# Patient Record
Sex: Male | Born: 1945 | Race: White | Hispanic: No | Marital: Married | Smoking: Never smoker
Health system: Southern US, Community
[De-identification: ages and names within clinical notes are randomized; demographics above are authoritative.]

## PROBLEM LIST (undated history)

## (undated) DIAGNOSIS — T8459XA Infection and inflammatory reaction due to other internal joint prosthesis, initial encounter: Secondary | ICD-10-CM

## (undated) DIAGNOSIS — Z86010 Personal history of colonic polyps: Secondary | ICD-10-CM

## (undated) DIAGNOSIS — K5790 Diverticulosis of intestine, part unspecified, without perforation or abscess without bleeding: Secondary | ICD-10-CM

## (undated) DIAGNOSIS — R7301 Impaired fasting glucose: Secondary | ICD-10-CM

## (undated) DIAGNOSIS — S43014A Anterior dislocation of right humerus, initial encounter: Secondary | ICD-10-CM

## (undated) DIAGNOSIS — M21372 Foot drop, left foot: Secondary | ICD-10-CM

## (undated) DIAGNOSIS — E7800 Pure hypercholesterolemia, unspecified: Secondary | ICD-10-CM

## (undated) DIAGNOSIS — Z9289 Personal history of other medical treatment: Secondary | ICD-10-CM

## (undated) DIAGNOSIS — I809 Phlebitis and thrombophlebitis of unspecified site: Secondary | ICD-10-CM

## (undated) DIAGNOSIS — Z96649 Presence of unspecified artificial hip joint: Secondary | ICD-10-CM

## (undated) DIAGNOSIS — M1990 Unspecified osteoarthritis, unspecified site: Secondary | ICD-10-CM

## (undated) DIAGNOSIS — G629 Polyneuropathy, unspecified: Secondary | ICD-10-CM

## (undated) DIAGNOSIS — I82409 Acute embolism and thrombosis of unspecified deep veins of unspecified lower extremity: Secondary | ICD-10-CM

## (undated) DIAGNOSIS — R7303 Prediabetes: Secondary | ICD-10-CM

## (undated) DIAGNOSIS — Z52000 Unspecified donor, whole blood: Secondary | ICD-10-CM

## (undated) DIAGNOSIS — Z860101 Personal history of adenomatous and serrated colon polyps: Secondary | ICD-10-CM

## (undated) DIAGNOSIS — M961 Postlaminectomy syndrome, not elsewhere classified: Secondary | ICD-10-CM

## (undated) DIAGNOSIS — M5136 Other intervertebral disc degeneration, lumbar region: Secondary | ICD-10-CM

## (undated) DIAGNOSIS — M25552 Pain in left hip: Secondary | ICD-10-CM

## (undated) DIAGNOSIS — N3941 Urge incontinence: Secondary | ICD-10-CM

## (undated) DIAGNOSIS — Z8546 Personal history of malignant neoplasm of prostate: Secondary | ICD-10-CM

## (undated) DIAGNOSIS — G894 Chronic pain syndrome: Secondary | ICD-10-CM

## (undated) DIAGNOSIS — G8929 Other chronic pain: Secondary | ICD-10-CM

## (undated) DIAGNOSIS — C801 Malignant (primary) neoplasm, unspecified: Secondary | ICD-10-CM

## (undated) DIAGNOSIS — N182 Chronic kidney disease, stage 2 (mild): Secondary | ICD-10-CM

## (undated) DIAGNOSIS — I872 Venous insufficiency (chronic) (peripheral): Secondary | ICD-10-CM

## (undated) HISTORY — DX: Personal history of colonic polyps: Z86.010

## (undated) HISTORY — DX: Other chronic pain: G89.29

## (undated) HISTORY — DX: Diverticulosis of intestine, part unspecified, without perforation or abscess without bleeding: K57.90

## (undated) HISTORY — DX: Urge incontinence: N39.41

## (undated) HISTORY — DX: Postlaminectomy syndrome, not elsewhere classified: M96.1

## (undated) HISTORY — DX: Impaired fasting glucose: R73.01

## (undated) HISTORY — DX: Foot drop, left foot: M21.372

## (undated) HISTORY — PX: SHOULDER ARTHROSCOPY: SHX128

## (undated) HISTORY — DX: Presence of unspecified artificial hip joint: Z96.649

## (undated) HISTORY — PX: COLONOSCOPY: SHX174

## (undated) HISTORY — PX: SPINAL CORD STIMULATOR REMOVAL: SHX2423

## (undated) HISTORY — DX: Acute embolism and thrombosis of unspecified deep veins of unspecified lower extremity: I82.409

## (undated) HISTORY — PX: POLYPECTOMY: SHX149

## (undated) HISTORY — DX: Pain in left hip: M25.552

## (undated) HISTORY — DX: Unspecified donor, whole blood: Z52.000

## (undated) HISTORY — DX: Personal history of adenomatous and serrated colon polyps: Z86.0101

## (undated) HISTORY — DX: Chronic pain syndrome: G89.4

## (undated) HISTORY — DX: Chronic kidney disease, stage 2 (mild): N18.2

## (undated) HISTORY — DX: Other intervertebral disc degeneration, lumbar region: M51.36

## (undated) HISTORY — DX: Infection and inflammatory reaction due to other internal joint prosthesis, initial encounter: T84.59XA

## (undated) HISTORY — DX: Anterior dislocation of right humerus, initial encounter: S43.014A

## (undated) HISTORY — DX: Personal history of malignant neoplasm of prostate: Z85.46

## (undated) HISTORY — PX: VASECTOMY: SHX75

## (undated) HISTORY — DX: Venous insufficiency (chronic) (peripheral): I87.2

## (undated) HISTORY — DX: Unspecified osteoarthritis, unspecified site: M19.90

## (undated) HISTORY — PX: CARPAL TUNNEL RELEASE: SHX101

## (undated) HISTORY — PX: JOINT REPLACEMENT: SHX530

## (undated) HISTORY — PX: BILATERAL CARPAL TUNNEL RELEASE: SHX6508

## (undated) HISTORY — DX: Pure hypercholesterolemia, unspecified: E78.00

## (undated) HISTORY — DX: Prediabetes: R73.03

## (undated) HISTORY — PX: FRACTURE SURGERY: SHX138

---
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
## 1967-10-08 HISTORY — PX: FRACTURE SURGERY: SHX138

## 2001-10-07 HISTORY — PX: OTHER SURGICAL HISTORY: SHX169

## 2004-04-10 ENCOUNTER — Ambulatory Visit (HOSPITAL_COMMUNITY): Admission: RE | Admit: 2004-04-10 | Discharge: 2004-04-11 | Payer: Self-pay | Admitting: Orthopedic Surgery

## 2004-08-27 ENCOUNTER — Ambulatory Visit: Payer: Self-pay | Admitting: Internal Medicine

## 2004-09-03 ENCOUNTER — Ambulatory Visit: Payer: Self-pay | Admitting: Internal Medicine

## 2004-10-07 HISTORY — PX: ROTATOR CUFF REPAIR: SHX139

## 2005-10-07 HISTORY — PX: OTHER SURGICAL HISTORY: SHX169

## 2005-12-16 ENCOUNTER — Ambulatory Visit: Payer: Self-pay | Admitting: Internal Medicine

## 2005-12-25 ENCOUNTER — Ambulatory Visit: Payer: Self-pay | Admitting: Internal Medicine

## 2006-01-06 ENCOUNTER — Encounter (INDEPENDENT_AMBULATORY_CARE_PROVIDER_SITE_OTHER): Payer: Self-pay | Admitting: *Deleted

## 2006-01-06 ENCOUNTER — Ambulatory Visit: Payer: Self-pay | Admitting: Internal Medicine

## 2006-10-07 HISTORY — PX: TOTAL HIP ARTHROPLASTY: SHX124

## 2006-10-23 ENCOUNTER — Encounter: Admission: RE | Admit: 2006-10-23 | Discharge: 2006-10-23 | Payer: Self-pay | Admitting: Orthopedic Surgery

## 2007-01-20 ENCOUNTER — Inpatient Hospital Stay (HOSPITAL_COMMUNITY): Admission: RE | Admit: 2007-01-20 | Discharge: 2007-01-24 | Payer: Self-pay | Admitting: Orthopedic Surgery

## 2010-05-14 ENCOUNTER — Telehealth (INDEPENDENT_AMBULATORY_CARE_PROVIDER_SITE_OTHER): Payer: Self-pay | Admitting: *Deleted

## 2010-05-18 DIAGNOSIS — E669 Obesity, unspecified: Secondary | ICD-10-CM

## 2010-05-18 DIAGNOSIS — IMO0002 Reserved for concepts with insufficient information to code with codable children: Secondary | ICD-10-CM | POA: Insufficient documentation

## 2010-05-18 DIAGNOSIS — M1710 Unilateral primary osteoarthritis, unspecified knee: Secondary | ICD-10-CM

## 2010-05-18 DIAGNOSIS — E66811 Obesity, class 1: Secondary | ICD-10-CM | POA: Insufficient documentation

## 2010-05-18 DIAGNOSIS — I872 Venous insufficiency (chronic) (peripheral): Secondary | ICD-10-CM | POA: Insufficient documentation

## 2010-05-21 ENCOUNTER — Ambulatory Visit: Payer: Self-pay | Admitting: Internal Medicine

## 2010-05-21 DIAGNOSIS — R972 Elevated prostate specific antigen [PSA]: Secondary | ICD-10-CM

## 2010-05-21 DIAGNOSIS — I809 Phlebitis and thrombophlebitis of unspecified site: Secondary | ICD-10-CM

## 2010-05-23 LAB — CONVERTED CEMR LAB
ALT: 18 units/L (ref 0–53)
AST: 19 units/L (ref 0–37)
Albumin: 3.9 g/dL (ref 3.5–5.2)
Alkaline Phosphatase: 77 units/L (ref 39–117)
BUN: 20 mg/dL (ref 6–23)
Basophils Absolute: 0 10*3/uL (ref 0.0–0.1)
Basophils Relative: 0.4 % (ref 0.0–3.0)
Bilirubin Urine: NEGATIVE
Bilirubin, Direct: 0.1 mg/dL (ref 0.0–0.3)
CO2: 27 meq/L (ref 19–32)
Calcium: 9.7 mg/dL (ref 8.4–10.5)
Chloride: 108 meq/L (ref 96–112)
Cholesterol: 168 mg/dL (ref 0–200)
Creatinine, Ser: 1.1 mg/dL (ref 0.4–1.5)
Eosinophils Absolute: 0.1 10*3/uL (ref 0.0–0.7)
Eosinophils Relative: 2.5 % (ref 0.0–5.0)
GFR calc non Af Amer: 71.33 mL/min (ref >60)
Glucose, Bld: 93 mg/dL (ref 70–99)
HCT: 47.5 % (ref 39.0–52.0)
HDL: 31.7 mg/dL — ABNORMAL LOW (ref >39.00)
Hemoglobin, Urine: NEGATIVE
Hemoglobin: 16.1 g/dL (ref 13.0–17.0)
Ketones, ur: NEGATIVE mg/dL
LDL Cholesterol: 108 mg/dL — ABNORMAL HIGH (ref 0–99)
Leukocytes, UA: NEGATIVE
Lymphocytes Relative: 29.1 % (ref 12.0–46.0)
Lymphs Abs: 1.6 10*3/uL (ref 0.7–4.0)
MCHC: 33.9 g/dL (ref 30.0–36.0)
MCV: 87.3 fL (ref 78.0–100.0)
Monocytes Absolute: 0.4 10*3/uL (ref 0.1–1.0)
Monocytes Relative: 8 % (ref 3.0–12.0)
Neutro Abs: 3.3 10*3/uL (ref 1.4–7.7)
Neutrophils Relative %: 60 % (ref 43.0–77.0)
Nitrite: NEGATIVE
PSA: 15.6 ng/mL — ABNORMAL HIGH (ref 0.10–4.00)
Phosphorus: 2.5 mg/dL (ref 2.3–4.6)
Platelets: 215 10*3/uL (ref 150.0–400.0)
Potassium: 4.9 meq/L (ref 3.5–5.1)
RBC: 5.44 M/uL (ref 4.22–5.81)
RDW: 15.8 % — ABNORMAL HIGH (ref 11.5–14.6)
Sodium: 140 meq/L (ref 135–145)
Specific Gravity, Urine: >1.03 (ref 1.000–1.030)
TSH: 1.63 microintl units/mL (ref 0.35–5.50)
Total Bilirubin: 0.6 mg/dL (ref 0.3–1.2)
Total CHOL/HDL Ratio: 5
Total Protein, Urine: NEGATIVE mg/dL
Total Protein: 6.8 g/dL (ref 6.0–8.3)
Triglycerides: 141 mg/dL (ref 0.0–149.0)
Urine Glucose: NEGATIVE mg/dL
Urobilinogen, UA: 0.2 (ref 0.0–1.0)
VLDL: 28.2 mg/dL (ref 0.0–40.0)
WBC: 5.5 10*3/uL (ref 4.5–10.5)
pH: 5 (ref 5.0–8.0)

## 2010-06-05 ENCOUNTER — Encounter: Payer: Self-pay | Admitting: Internal Medicine

## 2010-06-27 ENCOUNTER — Encounter: Payer: Self-pay | Admitting: Internal Medicine

## 2010-07-05 ENCOUNTER — Encounter: Payer: Self-pay | Admitting: Internal Medicine

## 2010-07-19 ENCOUNTER — Ambulatory Visit (HOSPITAL_COMMUNITY): Admission: RE | Admit: 2010-07-19 | Discharge: 2010-07-19 | Payer: Self-pay | Admitting: *Deleted

## 2010-07-31 ENCOUNTER — Encounter: Payer: Self-pay | Admitting: Internal Medicine

## 2010-08-07 ENCOUNTER — Ambulatory Visit
Admission: RE | Admit: 2010-08-07 | Discharge: 2010-10-04 | Payer: Self-pay | Source: Home / Self Care | Attending: Radiation Oncology | Admitting: Radiation Oncology

## 2010-08-08 ENCOUNTER — Encounter: Payer: Self-pay | Admitting: Internal Medicine

## 2010-08-17 ENCOUNTER — Encounter: Payer: Self-pay | Admitting: Internal Medicine

## 2010-10-05 ENCOUNTER — Encounter: Payer: Self-pay | Admitting: Internal Medicine

## 2010-10-07 DIAGNOSIS — C801 Malignant (primary) neoplasm, unspecified: Secondary | ICD-10-CM

## 2010-10-07 DIAGNOSIS — C61 Malignant neoplasm of prostate: Secondary | ICD-10-CM

## 2010-10-07 HISTORY — PX: OTHER SURGICAL HISTORY: SHX169

## 2010-10-07 HISTORY — PX: RADIOACTIVE SEED IMPLANT: SHX5150

## 2010-10-07 HISTORY — DX: Malignant neoplasm of prostate: C61

## 2010-10-07 HISTORY — DX: Malignant (primary) neoplasm, unspecified: C80.1

## 2010-10-22 LAB — CBC
HCT: 48 % (ref 39.0–52.0)
Hemoglobin: 16.1 g/dL (ref 13.0–17.0)
MCH: 31.9 pg (ref 26.0–34.0)
MCHC: 33.5 g/dL (ref 30.0–36.0)
MCV: 95 fL (ref 78.0–100.0)
Platelets: 193 10*3/uL (ref 150–400)
RBC: 5.05 MIL/uL (ref 4.22–5.81)
RDW: 13.9 % (ref 11.5–15.5)
WBC: 4.4 10*3/uL (ref 4.0–10.5)

## 2010-10-22 LAB — PROTIME-INR
INR: 0.97 (ref 0.00–1.49)
Prothrombin Time: 13.1 seconds (ref 11.6–15.2)

## 2010-10-22 LAB — COMPREHENSIVE METABOLIC PANEL
ALT: 28 U/L (ref 0–53)
AST: 24 U/L (ref 0–37)
Albumin: 3.7 g/dL (ref 3.5–5.2)
Alkaline Phosphatase: 87 U/L (ref 39–117)
BUN: 15 mg/dL (ref 6–23)
CO2: 30 mEq/L (ref 19–32)
Calcium: 9.9 mg/dL (ref 8.4–10.5)
Chloride: 105 mEq/L (ref 96–112)
Creatinine, Ser: 1.21 mg/dL (ref 0.4–1.5)
GFR calc Af Amer: >60 mL/min (ref >60)
GFR calc non Af Amer: >60 mL/min (ref >60)
Glucose, Bld: 95 mg/dL (ref 70–99)
Potassium: 4.7 mEq/L (ref 3.5–5.1)
Sodium: 142 mEq/L (ref 135–145)
Total Bilirubin: 0.9 mg/dL (ref 0.3–1.2)
Total Protein: 7.1 g/dL (ref 6.0–8.3)

## 2010-10-22 LAB — APTT: aPTT: 31 seconds (ref 24–37)

## 2010-10-23 ENCOUNTER — Encounter: Payer: Self-pay | Admitting: Internal Medicine

## 2010-10-23 ENCOUNTER — Ambulatory Visit
Admission: RE | Admit: 2010-10-23 | Discharge: 2010-10-23 | Payer: Self-pay | Source: Home / Self Care | Attending: Urology | Admitting: Urology

## 2010-10-28 ENCOUNTER — Encounter: Payer: Self-pay | Admitting: Orthopedic Surgery

## 2010-11-08 NOTE — Op Note (Signed)
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  OPERATION:  Prostate brachytherapy, cystoscopy with fluoroscopy.  ANESTHESIA:  General.  SURGEON:  Courtney Paris, M.D.  ASSISTANT:  Maryln Gottron, M.D.  BRIEF HISTORY:  A 65 year old patient with intermediate risk carcinoma of the prostate.  He had a PSA of 16.4 in September 2011.  Biopsy that month showed only two areas of the lateral left lobe with Gleason 3 + 3 of the prostate, but due to his elevated prostate-specific antigen a bone scan and CT scan were done which were negative for metastatic disease.  He had a 44 grams prostate.  He finished 25 treatments of external radiation therapy on October 04, 2010, and enters now for prostate brachytherapy for definitive treatment.  His IPSS score was low at 3 and he tolerated the external radiation quite well.  He understands the risks including impotence, radiation cystitis and/or proctitis and rectal injury but enters now for definitive therapy.  PROCEDURE IN DETAIL:  The patient was placed on the operating table in the dorsal lithotomy position after satisfactory induction of general anesthesia.  He was prepped and draped in the usual sterile fashion and given IV antibiotics.  The first time-out was performed and a rectal tube and the rectal ultrasound probe were inserted as well as a Foley catheter.  Dr. Dayton Scrape then did the planning for his brachytherapy and then I was called back to  the operating room where a second time-out was then performed.  A total of 26 needles of I-125 were then used for the treatment with 57 seeds.  The total apparent activity was 22.9710 millicuries.  The procedure seemed to go well and the fluoroscopy at the end of the case showed that he had good placement of the seeds into the prostate as evidenced from the 3 gold seeds which were placed prior to his radiation therapy.  The Foley catheter was then removed.  He was reprepped and a flexible cystoscope was used to inspect the anterior urethra, which showed no strictures, trilobar hyperplasia and the bladder was entered.  The bladder was free of any mucosal lesions and no seeds were seen in the bladder or on the retrograde view of the prostate.  Scope was removed and 16 Foley catheter inserted and the patient was taken to the recovery room in good condition.  He will later be discharged as an outpatient.  He will be instructed how to remove the Foley catheter in two days and will come back to see me in three weeks for followup.     Courtney Paris, M.D.     HMK/MEDQ  D:  10/23/2010  T:  10/23/2010  Job:  [id number]  Electronically Signed by Vic Blackbird M.D. on 11/08/2010 01:25:02 PM

## 2010-11-08 NOTE — Progress Notes (Signed)
Summary: Ok  to schedule cpx  Phone Note Call from Patient Call back at [facility] Phone [phone]   Caller: Patient Call For: wert Reason for Call: Talk to Nurse Summary of Call: former PCP of [facility].  Pt hasn't been seen since 2007, wants to schedule cpx - his wife is still a pcp pt of [facility].  Will MW still see this pt as primary. Initial call taken by: Eugene Gavia,  May 14, 2010 9:53 AM  Follow-up for Phone Call        Dr. Sherene Sires, pls advise.  Thanks! Gweneth Dimitri RN  May 14, 2010 10:17 AM ok for cpx  Follow-up by: Nyoka Cowden MD,  May 14, 2010 10:35 AM  Additional Follow-up for Phone Call Additional follow up Details #1::        Called, spoke with pt.  CPX scheduled for 05/21/10 at 8:45 am with [facility]--pt aware.  Advised pt to come fasting and to bring all meds with him to OV.  He verbalized understanding.  Gweneth Dimitri RN  May 14, 2010 10:39 AM

## 2010-11-08 NOTE — Letter (Signed)
Summary: [facility]  [facility]   Imported By: Sherian Rein 10/17/2010 14:17:02  _____________________________________________________________________  External Attachment:    Type:   Image     Comment:   External Document

## 2010-11-08 NOTE — Letter (Signed)
Summary: [facility]  [facility]   Imported By: Lennie Odor 08/20/2010 14:43:08  _____________________________________________________________________  External Attachment:    Type:   Image     Comment:   External Document

## 2010-11-08 NOTE — Letter (Signed)
Summary: [facility]  [facility]   Imported By: Lennie Odor 11/01/2010 15:00:21  _____________________________________________________________________  External Attachment:    Type:   Image     Comment:   External Document

## 2010-11-08 NOTE — Letter (Signed)
Summary: [facility]   Imported By: Sherian Rein 07/12/2010 09:23:52  _____________________________________________________________________  External Attachment:    Type:   Image     Comment:   External Document

## 2010-11-08 NOTE — Letter (Signed)
Summary: [facility]  [facility]   Imported By: Lester [place] 07/04/2010 08:35:35  _____________________________________________________________________  External Attachment:    Type:   Image     Comment:   External Document

## 2010-11-08 NOTE — Assessment & Plan Note (Signed)
Summary: Primary SVC/ cpx  - PSA 15 > urology referral   CC:  CPX.  fasting.  [name]  History of Present Illness: 69 yowm never smoker with severe DJD s/p L Hip replacement Dr Dorene Grebe.  May 21, 2010  1st pulmonary office eval in emr era. No specific c/o's.  Pt denies any significant sore throat, dysphagia, itching, sneezing,  nasal congestion or excess secretions,  fever, chills, sweats, unintended wt loss, pleuritic or exertional cp, hempoptysis, change in activity tolerance  orthopnea pnd or leg swelling. no tia, claudication symtoms    Current Medications (verified): 1)  Aspirin 325 Mg Tabs (Aspirin) .... Take 1 Tablet By Mouth Once A Day  Allergies (verified): 1)  ! Adhesive Tape  Past History:  Past Medical History: PHLEBITIS (ICD-451.9) OBESITY (ICD-278.00) OSTEOARTHRITIS, KNEES, BILATERAL (ICD-715.96) VENOUS INSUFFICIENCY, CHRONIC (ICD-459.81) Mod BPH     - PSA 15 May 21, 2010 > urology referral Health Maintenance....................................................Marland KitchenWert     - Td  2005     - Pneumovax May 22, 2010     - Coloscopy 01/06/2006     - CPX May 22, 2010        Past Surgical History: Rt rotator cuff repair July 2005 bilateral carpal tunnel surgery left hip replacement  Family History: Lung CA- Mother who was a smoker Rheumatic fever, father Vericose veins brother  Social History: Never smoker no alcohol Retired but still works PT for [employer] 4 children married  Vital Signs:  Patient profile:   65 year old male Height:      75 inches Weight:      265 pounds BMI:     33.24 O2 Sat:      99 % on Room air Temp:     98.0 degrees F oral Pulse rate:   58 / minute BP sitting:   120 / 60  (left arm) Cuff size:   large  Vitals Entered By: Gweneth Dimitri RN (May 21, 2010 8:42 AM)  O2 Flow:  Room air CC: CPX.  fasting.   Comments Medications reviewed with patient Daytime contact number verified with patient. Gweneth Dimitri RN  May 21, 2010 8:43 AM      Cholesterol               168 mg/dL                   1-610     ATP III Classification            Desirable:  < 200 mg/dL                    Borderline High:  200 - 239 mg/dL               High:  > = 240 mg/dL   Triglycerides             141.0 mg/dL                 9.6-045.4     Normal:  <150 mg/dL     Borderline High:  098 - 199 mg/dL   HDL                  [L]  11.91 mg/dL                 >47.82   VLDL Cholesterol          28.2 mg/dL  0.0-40.0   LDL Cholesterol      [H]  161 mg/dL                   0-96  CHO/HDL Ratio:  CHD Risk                             5                    Men          Women     1/2 Average Risk     3.4          3.3     Average Risk          5.0          4.4     2X Average Risk          9.6          7.1     3X Average Risk          15.0          11.0                           Tests: (2) Renal Function Panel (RENAL)   Sodium                    140 mEq/L                   135-145   Potassium                 4.9 mEq/L                   3.5-5.1   Chloride                  108 mEq/L                   96-112   Carbon Dioxide            27 mEq/L                    19-32   Calcium                   9.7 mg/dL                   0.4-54.0   Albumin                   3.9 g/dL                    9.8-1.1   BUN                       20 mg/dL                    9-14   Creatinine                1.1 mg/dL                   7.8-2.9   Glucose                   93 mg/dL  70-99   Phosphorus                2.5 mg/dL                   2.6-9.4   GFR                       71.33 mL/min                >60  Tests: (3) BMP (METABOL)   Sodium                    140 mEq/L                   135-145   Potassium                 4.9 mEq/L                   3.5-5.1   Chloride                  108 mEq/L                   96-112   Carbon Dioxide            27 mEq/L                    19-32   Glucose                   93 mg/dL                     85-46   BUN                       20 mg/dL                    2-70   Creatinine                1.1 mg/dL                   3.5-0.0   Calcium                   9.7 mg/dL                   9.3-81.8   GFR                       71.33 mL/min                >60  Tests: (4) CBC Platelet w/Diff (CBCD)   White Cell Count          5.5 K/uL                    4.5-10.5   Red Cell Count            5.44 Mil/uL                 4.22-5.81   Hemoglobin                16.1 g/dL                   29.9-37.1   Hematocrit                47.5 %  39.0-52.0   MCV                       87.3 fl                     78.0-100.0   MCHC                      33.9 g/dL                   13.0-86.5   RDW                  [H]  15.8 %                      11.5-14.6   Platelet Count            215.0 K/uL                  150.0-400.0   Neutrophil %              60.0 %                      43.0-77.0   Lymphocyte %              29.1 %                      12.0-46.0   Monocyte %                8.0 %                       3.0-12.0   Eosinophils%              2.5 %                       0.0-5.0   Basophils %               0.4 %                       0.0-3.0   Neutrophill Absolute      3.3 K/uL                    1.4-7.7   Lymphocyte Absolute       1.6 K/uL                    0.7-4.0   Monocyte Absolute         0.4 K/uL                    0.1-1.0  Eosinophils, Absolute                             0.1 K/uL                    0.0-0.7   Basophils Absolute        0.0 K/uL                    0.0-0.1  Tests: (5) Hepatic/Liver Function Panel (HEPATIC)   Total Bilirubin           0.6 mg/dL  0.3-1.2   Direct Bilirubin          0.1 mg/dL                   6.9-6.2   Alkaline Phosphatase      77 U/L                      39-117   AST                       19 U/L                      0-37   ALT                       18 U/L                      0-53   Total Protein             6.8 g/dL                     9.5-2.8   Albumin                   3.9 g/dL                    4.1-3.2  Tests: (6) TSH (TSH)   FastTSH                   1.63 uIU/mL                 0.35-5.50  Tests: (7) UDip Only (UDIP)   Color                     Lt. Yellow       RANGE:  Yellow;Lt. Yellow   Clarity                   Clear                       Clear   Specific Gravity          >1.030                      1.000 - 1.030   Urine Ph                  5.0                         5.0-8.0   Protein                   Negative                    Negative   Urine Glucose             Negative                    Negative   Ketones                   Negative                    Negative   Urine Bilirubin           Negative  Negative   Blood                     Negative                    Negative   Urobilinogen              0.2 mg/dL                   0.0 - 1.0   Leukocyte Esterace        Negative                    Negative   Nitrite                   Negative                    Negative  Tests: (8) Prostate Specific Antigen (PSA)   PSA-Hyb              [H]  15.60 ng/mL                 0.10-4.00  CXR  Procedure date:  05/21/2010  Findings:      No active cardiopulmonary disease.  Impression & Recommendations:  Problem # 1:  VENOUS INSUFFICIENCY, CHRONIC (ICD-459.81) High risk dvt, venous ulcers.  rx ASA and support hose, refer to Vascular as needed   Problem # 2:  ELEVATED PROSTATE SPECIFIC ANTIGEN (ICD-790.93)  Exam c/w mod bph only, refer to urology  Orders: Urology Referral (Urology)  Problem # 3:  OBESITY (ICD-278.00)  Weight control is a matter of calorie balance which needs to be tilted in the pt's favor by eating less and exercising more.  Specifically, I recommended  exercise at a level where pt  is short of breath but not out of breath 30 minutes daily.  If not losing weight on this program, I would strongly recommend pt see a nutritionist with a food diary recorded for two weeks  prior to the visit.     Medications Added to Medication List This Visit: 1)  Aspirin 325 Mg Tabs (Aspirin) .... Take 1 tablet by mouth once a day  Other Orders: Pneumococcal Vaccine (16109) Admin 1st Vaccine (60454) TLB-PSA (Prostate Specific Antigen) (84153-PSA) T-2 View CXR (71020TC) TLB-Lipid Panel (80061-LIPID) TLB-Renal Function Panel (80069-RENAL) TLB-BMP (Basic Metabolic Panel-BMET) (80048-METABOL) TLB-CBC Platelet - w/Differential (85025-CBCD) TLB-Hepatic/Liver Function Pnl (80076-HEPATIC) TLB-TSH (Thyroid Stimulating Hormone) (84443-TSH) TLB-Udip ONLY (81003-UDIP) Est. Patient 65& > (09811) New Patient 65&> (91478)  Patient Instructions: 1)  Call [phone] for your results w/in next 3 days - if there's something important  I feel you need to know,  I'll be in touch with you directly.    CardioPerfect ECG  ID: [id number] Patient: [name], [name] DOB: [birth year] Age: 65 Years Old Sex: Male Race: White Physician: Claudean Leavelle Technician: Gweneth Dimitri RN Height: 75 Weight: 265 Status: Unconfirmed Recorded: 05/21/2010 08:58 AM P/PR: 105 ms / 195 ms - Heart rate (maximum exercise) QRS: 98 QT/QTc/QTd: 427 ms / 418 ms / 22 ms - Heart rate (maximum exercise)  P/QRS/T axis: -11 deg / 38 deg / 61 deg - Heart rate (maximum exercise)  Heartrate: 55 bpm  Interpretation:   sinus rhythm (slow)   Normal ECG     Immunizations Administered:  Pneumonia Vaccine:    Vaccine Type: Pneumovax (Medicare)    Site: left deltoid    Mfr: Merck  Dose: 0.5 ml    Route: IM    Given by: Gweneth Dimitri RN    Exp. Date: 10/24/2011    Lot #: [id number]    VIS given: 05/04/96 version given May 21, 2010.

## 2010-11-08 NOTE — Consult Note (Signed)
[facility]   Imported By: Sherian Rein 08/23/2010 14:19:00  _____________________________________________________________________  External Attachment:    Type:   Image     Comment:   External Document

## 2010-11-08 NOTE — Consult Note (Signed)
Summary: [facility]  [facility]   Imported By: Lennie Odor 06/13/2010 12:08:41  _____________________________________________________________________  External Attachment:    Type:   Image     Comment:   External Document

## 2010-11-08 NOTE — Letter (Signed)
Summary: [facility]   Imported By: Sherian Rein 08/09/2010 14:39:28  _____________________________________________________________________  External Attachment:    Type:   Image     Comment:   External Document

## 2010-11-13 ENCOUNTER — Encounter: Payer: Self-pay | Admitting: Internal Medicine

## 2010-11-13 ENCOUNTER — Ambulatory Visit: Payer: Medicare Other | Attending: Radiation Oncology | Admitting: Radiation Oncology

## 2010-11-13 ENCOUNTER — Ambulatory Visit: Payer: Self-pay | Admitting: Radiation Oncology

## 2010-11-13 DIAGNOSIS — C61 Malignant neoplasm of prostate: Secondary | ICD-10-CM | POA: Insufficient documentation

## 2010-11-15 ENCOUNTER — Encounter: Payer: Self-pay | Admitting: Internal Medicine

## 2010-11-16 ENCOUNTER — Ambulatory Visit: Payer: Medicare Other | Attending: Radiation Oncology | Admitting: Radiation Oncology

## 2010-11-16 DIAGNOSIS — C61 Malignant neoplasm of prostate: Secondary | ICD-10-CM | POA: Insufficient documentation

## 2010-11-28 NOTE — Letter (Signed)
Summary: [facility]  [facility]   Imported By: Sherian Rein 11/20/2010 15:08:30  _____________________________________________________________________  External Attachment:    Type:   Image     Comment:   External Document

## 2010-11-28 NOTE — Consult Note (Signed)
Summary: [facility]  [facility]   Imported By: Lester [place] 11/21/2010 07:17:13  _____________________________________________________________________  External Attachment:    Type:   Image     Comment:   External Document

## 2011-02-22 NOTE — Op Note (Signed)
[name], [name]              ACCOUNT NO.:  [id number]   MEDICAL RECORD NO.:  [id number]          PATIENT TYPE:  INP   LOCATION:  2899                         FACILITY:  [facility]   PHYSICIAN:  Burnard Bunting, M.D.    DATE OF BIRTH:  1946-02-11   DATE OF PROCEDURE:  01/20/2007  DATE OF DISCHARGE:                               OPERATIVE REPORT   PREOPERATIVE DIAGNOSES:  1. Left hip arthritis.  2. Post traumatic deformity.   POSTOPERATIVE DIAGNOSES:  1. Left hip arthritis.  2. Post traumatic deformity.   PROCEDURE:  Left total hip replacement.   SURGEON:  Burnard Bunting, M.D.   ASSISTANT:  ___Dick Tresa Res MD____   ANESTHESIA:  General endotracheal.   ESTIMATED BLOOD LOSS:  350   DRAINS:  Hemovac times one.   INDICATIONS FOR PROCEDURE:  [name] is a 65 year old patient with  end-stage left hip arthritis.  He presents now for operative management  after explanation of risks and benefits.  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  Component II, DePuy S-Rom  acetabular cup size 60, 53 head; S-Rom pedals free 42F small, femoral  stem 20 x 15 x 165, 36 standard neck with 12 lateral offset and a +9  taper-sleeve adaptor.   PROCEDURE IN DETAIL:  The patient was brought to the operating room  where general endotracheal anesthesia was introduced.  Preoperative  antibiotics administered in the left leg.  He was placed in the lateral  decubitus position with an axillary bulb and left peroneal nerve well  padded.  The patient's left hip, leg and foot were prepped in a sterile  fashion, the hip draped in a sterile manner. ioban was used to cover the  operative field.  ________ was utilized.  Skin and subcutaneous tissues  were sharply divided.  Fascia lata was encountered and divided distally  and proximally with blunt dissection of the gluteus maxis muscle fibers.  The  sciatic nerve was palpated at this time and protected throughout all  the remaining portion of the case.  At this time, capsulotomy was  performed after detachment of the piriformis tendon.  Significant  thickening of the capsule was encountered, both posteriorly and  anteriorly.  At this time, the head was dislocated and, using a trial  stem, a femoral neck cut was made.  The femoral neck cut was made.  The  canal was then reamed up to 15.5 mm which gave excellent cortical bite.  At this time, acetabulum was reamed.  The anterior retractor was placed.  A transverse acetabulum ligament was released.  A Holman retractor was  placed.  The acetabulum was then reamed and 45 degrees of abduction and  about 20 degrees of anteversion.  The 60-mm acetabular cup was tapped  into position with excellent pressure obtained.  At this time, the  proximal reamer was prepared using a proximal reaming.  Taper _______  sleeve was placed.  A trial stem was then placed in 30 degrees of  anteversion;  0, +6 and +9 sleeve adaptors were then utilized to check  stability.  With the +9 sleeve in place, the patient had full extension,  excellent rotation, was stable in a position of sleep and had stability  to 70 degrees of internal rotation with 90 degrees of hip flexion and 10  degrees of adduction.  Radiograph demonstrated approximately equal leg  lengths.  He was about 1 cm short on that side prior to the procedure.   At this time, the trial femoral components were removed, the true  components were placed.  The same stability parameters maintained.  _Sciatic nerve______ was then palpated and found to be intact.  The  incision was then thoroughly irrigated.  The incision was then closed  over a Hemovac drain using #1 Vicryl and reapproximating the fascia lata  followed by interrupted and inverted 0-Vicryl, 2-0 Vicryl and skin  staples.  A knee immobilizer was then placed.  _Leg lengths were near  equal at the  end_ of the case.   It should be noted that the assistance of Dr. Tresa Res was required for  manipulation of the leg as well as retraction of the important  neurovascular structures with the medical necessity for the safety of  the patient that Dr. Tresa Res was present for his expert assistance.      Burnard Bunting, M.D.  Electronically Signed     GSD/MEDQ  D:  01/20/2007  T:  01/21/2007  Job:  [id number]

## 2011-02-22 NOTE — Op Note (Signed)
[name], [name]                          ACCOUNT NO.:  [id number]   MEDICAL RECORD NO.:  [id number]                   PATIENT TYPE:  OIB   LOCATION:  5020                                 FACILITY:  [facility]   PHYSICIAN:  Burnard Bunting, M.D.                 DATE OF BIRTH:  01-Mar-1946   DATE OF PROCEDURE:  04/10/2004  DATE OF DISCHARGE:  04/11/2004                                 OPERATIVE REPORT   PREOPERATIVE DIAGNOSIS:  Right shoulder rotator cuff tear and superior  labrum anterior to posterior tear and impingement.   POSTOPERATIVE DIAGNOSIS:  Right shoulder rotator cuff tear and superior  labrum anterior to posterior tear and impingement.   PROCEDURE:  1. Right shoulder diagnostic arthroscopy with subacromial decompression.  2. Right shoulder debridement with intra-articular superior labrum anterior     to posterior tear.  3. Right shoulder open biceps tenodesis.  4. Right shoulder rotator cuff tendon repair with tear measuring 2 x 2 cm.   SURGEON:  Burnard Bunting, M.D.   ANESTHESIA:  General endotracheal plus interscalene block.   FINDINGS:  1. Examination under anesthesia, range of motion, external rotation 15     degrees of abduction 70 degrees, forward flexion 180 degrees, internal     rotation 90 degrees, abduction 90 degrees.  2. Diagnostic operative arthroscopy:     a. Type 2 superior labrum anterior to posterior tear.     b. Intact glenohumeral articular surface.     c. 2 x 2 cm rotator cuff tear.     d. Impingement bursitis.     e. Significant biceps tendon fraying at the anchor.   DESCRIPTION OF PROCEDURE:  The patient was brought to the operating room  where a general anesthesia was induced.  Preoperative antibiotics were  administered.  The right shoulder was prepped.  The patient was placed in  the beach chair position with the head in the neutral alignment and the left  arm well padded.  The right arm, hand, elbow and shoulder were prepped and  draped in  a sterile manner.   The anatomy of the shoulder was identified through the posterior, anterior  and lateral margins of the acromion as well as the [facility] joint and coracoid  process.  Then 10 cc of epinephrine solution was injected into the  subacromial space, 20 cc of saline was injected to the glenohumeral joint.  The posterior portal was then established.  The portal was 2 cm medial and  inferior to the posterior lateral margin of the acromion.  Diagnostic  arthroscopy was performed.  The anterior portal was created under direct  visualization with the spinal needle.  A type 2 SLAP tear was noted with an  unstable biceps anchor.  Biceps fraying was also observed.  The biceps  tendon was released, and the SLAP tear was debrided.  Extensive debridement  was required.  Accompanying synovitis was  also debrided.  The patient had  intact glenohumeral articular surfaces.  He had a intact 2 x 2 cm rotator  cuff tear and approximately a 1.5 x 2 cm rotator tear of the supraspinatus.  Intra-articular subscapularis was intact.  At this time, the scope was  placed into a subacromial space.  Coracoacromial ligament was released but  not resected.  Bursectomy was performed.  Subacromial decompression was also  performed using cutting block technique.  At this time, Collier Flowers was used to  cover the operative field.  Anterior and posterior portals were closed  before and after placing the [place] using 3-0 nylon sutures.  A lateral joint  incision was made on the shoulder measuring approximately 2 inches.  The  skin and subcutaneous tissue was sharply divided.  Deltoid was split and  measured a distance of 3 cm from its attachment on the acromion.  Stay  suture was placed at the apex of this deltoid split.  Rotator cuff tear was  identified.  Rotator cuff was mobilized using releases and the Cobb  elevator.  Rotator cuff tear has been reattached to a prepared bony trough  using both _________ and 5-0 corkscrew  suture anchors to obtain a double row  repair.  At this time, the arm was taken through a range of motion and a  repair was found to be water tight and intact.  The subacromial space and  joint were then thoroughly irrigated.  Deltoid split was created using a #1  Vicryl suture.  Skin was closed using interrupted inverted 2-0 Vicryl suture  and running 3-0 pullout Prolene.  The patient was placed in a bulky dressing  and shoulder immobilizer.  He tolerated the procedure well without immediate  complications.                                               Burnard Bunting, M.D.    GSD/MEDQ  D:  04/28/2004  T:  04/29/2004  Job:  [id number]

## 2011-02-22 NOTE — Discharge Summary (Signed)
NAMEARIEN, BENINCASA              ACCOUNT NO.:  [id number]   MEDICAL RECORD NO.:  [id number]          PATIENT TYPE:  INP   LOCATION:  5015                         FACILITY:  [facility]   PHYSICIAN:  Burnard Bunting, M.D.    DATE OF BIRTH:  1945-10-30   DATE OF ADMISSION:  01/20/2007  DATE OF DISCHARGE:  01/24/2007                               DISCHARGE SUMMARY   DISCHARGE DIAGNOSIS:  Left hip arthritis.   SECONDARY DIAGNOSIS:  None.   OPERATIONS AND PROCEDURES:  Left total hip replacement performed January 20, 2007.   HOSPITAL COURSE:  Crosby Oriordan is an 65 year old ambulatory male with  left hip arthritis.  He underwent left total hip replacement, January 20, 2007.  He tolerated the procedure well without immediate complications.  Postop day 1, leg lengths were equal.  Hemoglobin was 13.6.  He was  started on Coumadin for DVT prophylaxis.  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  He had an unremarkable  recovery.  He mobilized well with physical therapy, weightbearing as  tolerated.  INR was near therapeutic at the time of discharge at 1.8.  Incision was intact.  The patient otherwise had an unremarkable  recovery.  He was discharged home in good condition.   DISCHARGE MEDICATIONS:  Include:  1. Percocet 5/325 1 or 2 p.o. every 4 hours p.r.n. pain.  2. Robaxin 500 mg p.o. every 6 hours p.r.n. spasm.  3. Coumadin 5 mg p.o. daily until INR is 2 to 2.5.   Will follow up with me in 7 days for suture removal.      Burnard Bunting, M.D.  Electronically Signed     GSD/MEDQ  D:  03/18/2007  T:  03/19/2007  Job:  [id number]

## 2011-02-28 ENCOUNTER — Encounter: Payer: Self-pay | Admitting: Internal Medicine

## 2011-03-25 ENCOUNTER — Encounter: Payer: Self-pay | Admitting: Internal Medicine

## 2011-04-04 ENCOUNTER — Ambulatory Visit (AMBULATORY_SURGERY_CENTER): Payer: Medicare Other | Admitting: *Deleted

## 2011-04-04 VITALS — Ht 75.0 in | Wt 268.3 lb

## 2011-04-04 DIAGNOSIS — Z86010 Personal history of colonic polyps: Secondary | ICD-10-CM

## 2011-04-04 MED ORDER — PEG-KCL-NACL-NASULF-NA ASC-C 100 G PO SOLR: ORAL | Status: DC

## 2011-04-11 ENCOUNTER — Ambulatory Visit (AMBULATORY_SURGERY_CENTER): Payer: Medicare Other | Admitting: Internal Medicine

## 2011-04-11 ENCOUNTER — Encounter: Payer: Self-pay | Admitting: Internal Medicine

## 2011-04-11 VITALS — BP 143/76 | HR 6 | Temp 97.0°F | Resp 12 | Ht 75.0 in | Wt 268.0 lb

## 2011-04-11 DIAGNOSIS — Z1211 Encounter for screening for malignant neoplasm of colon: Secondary | ICD-10-CM

## 2011-04-11 DIAGNOSIS — D126 Benign neoplasm of colon, unspecified: Secondary | ICD-10-CM

## 2011-04-11 DIAGNOSIS — Z86010 Personal history of colonic polyps: Secondary | ICD-10-CM

## 2011-04-11 MED ORDER — SODIUM CHLORIDE 0.9 % IV SOLN: 500.0000 mL | INTRAVENOUS | Status: DC

## 2011-04-11 NOTE — Patient Instructions (Signed)
Discharge instructions given with verbal understanding. Handouts on polyps given. Resume previous medications.

## 2011-04-12 ENCOUNTER — Telehealth: Payer: Self-pay | Admitting: *Deleted

## 2011-04-12 NOTE — Telephone Encounter (Signed)
No voicemail i.d. No mess left

## 2011-10-03 ENCOUNTER — Encounter: Payer: Self-pay | Admitting: *Deleted

## 2011-10-03 NOTE — Progress Notes (Signed)
08/08/2010 I-PSS RESULTS=[id number] SCORE=3 11/13/2010 I-PSS RESULTS=[id number] SCORE=22

## 2011-11-19 ENCOUNTER — Encounter: Payer: Medicare Other | Admitting: Family Medicine

## 2011-11-26 ENCOUNTER — Encounter: Payer: Self-pay | Admitting: Family Medicine

## 2011-11-26 ENCOUNTER — Ambulatory Visit (INDEPENDENT_AMBULATORY_CARE_PROVIDER_SITE_OTHER): Payer: Medicare Other | Admitting: Family Medicine

## 2011-11-26 VITALS — BP 143/88 | HR 52 | Temp 98.0°F | Ht 75.0 in | Wt 274.0 lb

## 2011-11-26 DIAGNOSIS — Z0000 Encounter for general adult medical examination without abnormal findings: Secondary | ICD-10-CM

## 2011-11-26 LAB — COMPREHENSIVE METABOLIC PANEL
ALT: 27 U/L (ref 0–53)
AST: 23 U/L (ref 0–37)
Alkaline Phosphatase: 76 U/L (ref 39–117)
Calcium: 9.9 mg/dL (ref 8.4–10.5)
Chloride: 105 mEq/L (ref 96–112)
Creat: 1.05 mg/dL (ref 0.50–1.35)
Potassium: 4.4 mEq/L (ref 3.5–5.3)

## 2011-11-26 LAB — CBC WITH DIFFERENTIAL/PLATELET
Basophils Absolute: 0 10*3/uL (ref 0.0–0.1)
Basophils Relative: 0 % (ref 0–1)
Eosinophils Absolute: 0.2 10*3/uL (ref 0.0–0.7)
Eosinophils Relative: 3 % (ref 0–5)
HCT: 51.2 % (ref 39.0–52.0)
Lymphocytes Relative: 27 % (ref 12–46)
MCH: 32 pg (ref 26.0–34.0)
MCHC: 33.2 g/dL (ref 30.0–36.0)
Monocytes Absolute: 0.5 10*3/uL (ref 0.1–1.0)
Neutro Abs: 3.2 10*3/uL (ref 1.7–7.7)
Neutrophils Relative %: 59 % (ref 43–77)
RDW: 13.2 % (ref 11.5–15.5)
WBC: 5.3 10*3/uL (ref 4.0–10.5)

## 2011-11-26 LAB — TSH: TSH: 1.423 u[IU]/mL (ref 0.350–4.500)

## 2011-11-26 LAB — LIPID PANEL: LDL Cholesterol: 118 mg/dL — ABNORMAL HIGH (ref 0–99)

## 2011-11-26 MED ORDER — ZOSTER VACCINE LIVE 19400 UNT/0.65ML ~~LOC~~ SOLR: 0.6500 mL | Freq: Once | SUBCUTANEOUS | Status: AC

## 2011-11-26 MED ORDER — ZOSTER VACCINE LIVE 19400 UNT/0.65ML ~~LOC~~ SOLR: 0.6500 mL | Freq: Once | SUBCUTANEOUS | Status: DC

## 2011-11-26 NOTE — Patient Instructions (Signed)
Health Maintenance, Males A healthy lifestyle and preventative care can promote health and wellness.  Maintain regular health, dental, and eye exams.   Eat a healthy diet. Foods like vegetables, fruits, whole grains, low-fat dairy products, and lean protein foods contain the nutrients you need without too many calories. Decrease your intake of foods high in solid fats, added sugars, and salt. Get information about a proper diet from your caregiver, if necessary.   Regular physical exercise is one of the most important things you can do for your health. Most adults should get at least 150 minutes of moderate-intensity exercise (any activity that increases your heart rate and causes you to sweat) each week. In addition, most adults need muscle-strengthening exercises on 2 or more days a week.    Maintain a healthy weight. The body mass index (BMI) is a screening tool to identify possible weight problems. It provides an estimate of body fat based on height and weight. Your caregiver can help determine your BMI, and can help you achieve or maintain a healthy weight. For adults 20 years and older:   A BMI below 18.5 is considered underweight.   A BMI of 18.5 to 24.9 is normal.   A BMI of 25 to 29.9 is considered overweight.   A BMI of 30 and above is considered obese.   Maintain normal blood lipids and cholesterol by exercising and minimizing your intake of saturated fat. Eat a balanced diet with plenty of fruits and vegetables. Blood tests for lipids and cholesterol should begin at age 20 and be repeated every 5 years. If your lipid or cholesterol levels are high, you are over 50, or you are a high risk for heart disease, you may need your cholesterol levels checked more frequently.Ongoing high lipid and cholesterol levels should be treated with medicines, if diet and exercise are not effective.   If you smoke, find out from your caregiver how to quit. If you do not use tobacco, do not start.    If you choose to drink alcohol, do not exceed 2 drinks per day. One drink is considered to be 12 ounces (355 mL) of beer, 5 ounces (148 mL) of wine, or 1.5 ounces (44 mL) of liquor.   Avoid use of street drugs. Do not share needles with anyone. Ask for help if you need support or instructions about stopping the use of drugs.   High blood pressure causes heart disease and increases the risk of stroke. Blood pressure should be checked at least every 1 to 2 years. Ongoing high blood pressure should be treated with medicines if weight loss and exercise are not effective.   If you are 45 to 67 years old, ask your caregiver if you should take aspirin to prevent heart disease.   Diabetes screening involves taking a blood sample to check your fasting blood sugar level. This should be done once every 3 years, after age 45, if you are within normal weight and without risk factors for diabetes. Testing should be considered at a younger age or be carried out more frequently if you are overweight and have at least 1 risk factor for diabetes.   Colorectal cancer can be detected and often prevented. Most routine colorectal cancer screening begins at the age of 50 and continues through age 75. However, your caregiver may recommend screening at an earlier age if you have risk factors for colon cancer. On a yearly basis, your caregiver may provide home test kits to check for hidden   blood in the stool. Use of a small camera at the end of a tube, to directly examine the colon (sigmoidoscopy or colonoscopy), can detect the earliest forms of colorectal cancer. Talk to your caregiver about this at age 50, when routine screening begins. Direct examination of the colon should be repeated every 5 to 10 years through age 75, unless early forms of pre-cancerous polyps or small growths are found.   Healthy men should no longer receive prostate-specific antigen (PSA) blood tests as part of routine cancer screening. Consult with  your caregiver about prostate cancer screening.   Practice safe sex. Use condoms and avoid high-risk sexual practices to reduce the spread of sexually transmitted infections (STIs).   Use sunscreen with a sun protection factor (SPF) of 30 or greater. Apply sunscreen liberally and repeatedly throughout the day. You should seek shade when your shadow is shorter than you. Protect yourself by wearing long sleeves, pants, a wide-brimmed hat, and sunglasses year round, whenever you are outdoors.   Notify your caregiver of new moles or changes in moles, especially if there is a change in shape or color. Also notify your caregiver if a mole is larger than the size of a pencil eraser.   A one-time screening for abdominal aortic aneurysm (AAA) and surgical repair of large AAAs by sound wave imaging (ultrasonography) is recommended for ages 65 to 75 years who are current or former smokers.   Stay current with your immunizations.  Document Released: 03/21/2008 Document Revised: 06/05/2011 Document Reviewed: 02/18/2011 Exit[employer] Patient Information 2012 [employer]. 

## 2011-11-26 NOTE — Assessment & Plan Note (Signed)
Reviewed age and gender appropriate health maintenance issues (prudent diet, regular exercise, health risks of tobacco and excessive alcohol, use of seatbelts, fire alarms in home, use of sunscreen).  Also reviewed age and gender appropriate health screening as well as vaccine recommendations. Zostavax rx given today. Fasting lipid panel, CBC, CMET, and TSH drawn today. He gets routine (q7mo) urologist f/u for prostate ca surveillance. His colonoscopy is UTD--needs repeat in 2015. Encouraged increased exercise and caloric limitation --get BMI down.

## 2011-11-26 NOTE — Progress Notes (Signed)
Office Note 11/26/2011  CC:  Chief Complaint  Patient presents with  . Establish Care    transfer from Dr. Sherene Sires    HPI:  [name] is a 66 y.o. White male who is transferring care from Patient's most recent primary MD: Dr. Sherene Sires at [facility] Old records in EPIC/HL were reviewed prior to or during today's visit.  Hasn't had CPE in a couple of years at least. Feels well, no acute complaints.  Past Medical History  Diagnosis Date  . Cancer     prostate cancer 2011--ext beam radiation + radiation seed implants  . Hx of adenomatous colonic polyps     polypectomy 2007 and 04/2011 (+adenomatous, no high grade dysplasia)  . Foot drop, left     s/p hip fracture 1969  . Chronic venous insufficiency     +compression hose  . Osteoarthritis     knees and hips primarily    Past Surgical History  Procedure Date  . Seed implant prostate   . Total hip arthroplasty     left  . Rotator cuff repair     right  . Bilateral carpal tunnel release   . Arthroscopy left shoulder     FH: noncontributory  History   Social History  . Marital Status: Married    Spouse Name: N/A    Number of Children: N/A  . Years of Education: N/A   Occupational History  . Not on file.   Social History Main Topics  . Smoking status: Never Smoker   . Smokeless tobacco: Never Used  . Alcohol Use: No  . Drug Use: No  . Sexually Active: Not on file   Other Topics Concern  . Not on file   Social History Narrative   Married, 2 biologic children, 2 adopted, 5 GC.Lives in [place].  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  No T/A/Ds.Enjoys woodworking.    Outpatient Encounter Prescriptions as of 11/26/2011  Medication Sig Dispense Refill  . aspirin 81 MG tablet Take 81 mg by mouth daily.        . Multiple Vitamin (MULTIVITAMIN) tablet Take 1 tablet by mouth daily.        . Omega-3 Fatty Acids (FISH OIL) 500 MG CAPS Take by  mouth. 1 tablet daily       . solifenacin (VESICARE) 5 MG tablet Take 5 mg by mouth daily.      [name] zoster vaccine live, PF, (ZOSTAVAX) 16109 UNT/0.65ML injection Inject 19,400 Units into the skin once.  1 vial  0  . DISCONTD: peg 3350 powder (MOVIPREP) 100 G SOLR moviprep as directed  1 kit  0  . DISCONTD: zoster vaccine live, PF, (ZOSTAVAX) 60454 UNT/0.65ML injection Inject 19,400 Units into the skin once.  1 vial  0   Facility-Administered Encounter Medications as of 11/26/2011  Medication Dose Route Frequency Provider Last Rate Last Dose  . DISCONTD: 0.9 %  sodium chloride infusion  500 mL Intravenous Continuous Yancey Flemings, MD        No Known Allergies  ROS Review of Systems  Constitutional: Negative for fever, chills, appetite change and fatigue.  HENT: Negative for ear pain, congestion, sore throat, neck stiffness and dental problem.   Eyes: Negative for discharge, redness and visual disturbance.  Respiratory: Negative for cough, chest tightness, shortness of breath and wheezing.   Cardiovascular: Positive for leg swelling (mild chronic bilateral--venous stasis). Negative for chest pain and palpitations.  Gastrointestinal: Negative for nausea, vomiting, abdominal pain, diarrhea and blood in stool.  Genitourinary: Negative for dysuria, urgency, frequency, hematuria, flank pain and difficulty urinating.  Musculoskeletal: Negative for myalgias, back pain, joint swelling and arthralgias.  Skin: Negative for pallor and rash.  Neurological: Negative for dizziness, speech difficulty, weakness and headaches.  Hematological: Negative for adenopathy. Does not bruise/bleed easily.  Psychiatric/Behavioral: Negative for confusion and sleep disturbance. The patient is not nervous/anxious.     PE; Blood pressure 143/88, pulse 52, temperature 98 F (36.7 C), temperature source Temporal, height 6\' 3"  (1.905 m), weight 274 lb (124.286 kg), SpO2 99.00%. Gen: Alert, well appearing, mildly  obese-appearing white male.  Patient is oriented to person, place, time, and situation.  Affect is pleasant.  Thought is lucid, speech lucid. ENT: Ears: EACs clear, normal epithelium.  TMs with good light reflex and landmarks bilaterally.  Eyes: no injection, icteris, swelling, or exudate.  EOMI, PERRLA. Nose: no drainage or turbinate edema/swelling.  No injection or focal lesion.  Mouth: lips without lesion/swelling.  Oral mucosa pink and moist.  Dentition intact, several fillings in place. Oropharynx without erythema, exudate, or swelling.  Neck - No masses or thyromegaly or limitation in range of motion.  Carotids 2+ bilat, no bruits. CV: RRR, no m/r/g.   LUNGS: CTA bilat, nonlabored resps, good aeration in all lung fields. ABD: soft, NT, ND, BS normal.  No hepatospenomegaly or mass.  No bruits. EXT: 2+ pitting edema from knee down to feet bilat, extensive varicosities and hemosiderin changes of skin in ankles.  Left anterior tibial hyperpigmented macular area (present since staph infection in that area many years ago per pt--no recent change). Neuro: normal except left foot drop (weakness in extension of ankle).  Pertinent labs:  None today  ASSESSMENT AND PLAN:   Transfer pt:  Health maintenance examination Reviewed age and gender appropriate health maintenance issues (prudent diet, regular exercise, health risks of tobacco and excessive alcohol, use of seatbelts, fire alarms in home, use of sunscreen).  Also reviewed age and gender appropriate health screening as well as vaccine recommendations. Zostavax rx given today. Fasting lipid panel, CBC, CMET, and TSH drawn today. He gets routine (q83mo) urologist f/u for prostate ca surveillance. His colonoscopy is UTD--needs repeat in 2015. Encouraged increased exercise and caloric limitation --get BMI down.     Return in about 1 year (around 11/25/2012) for CPE .

## 2011-11-28 ENCOUNTER — Encounter: Payer: Self-pay | Admitting: Family Medicine

## 2012-08-14 ENCOUNTER — Encounter: Payer: Self-pay | Admitting: Family Medicine

## 2012-11-25 ENCOUNTER — Ambulatory Visit (INDEPENDENT_AMBULATORY_CARE_PROVIDER_SITE_OTHER): Payer: Medicare Other | Admitting: Family Medicine

## 2012-11-25 ENCOUNTER — Encounter: Payer: Self-pay | Admitting: Family Medicine

## 2012-11-25 VITALS — BP 160/82 | HR 56 | Resp 16 | Ht 75.0 in | Wt 269.0 lb

## 2012-11-25 DIAGNOSIS — R030 Elevated blood-pressure reading, without diagnosis of hypertension: Secondary | ICD-10-CM

## 2012-11-25 DIAGNOSIS — Z23 Encounter for immunization: Secondary | ICD-10-CM

## 2012-11-25 DIAGNOSIS — Z0000 Encounter for general adult medical examination without abnormal findings: Secondary | ICD-10-CM

## 2012-11-25 LAB — CBC WITH DIFFERENTIAL/PLATELET
Basophils Absolute: 0 10*3/uL (ref 0.0–0.1)
Basophils Relative: 0.4 % (ref 0.0–3.0)
Eosinophils Absolute: 0.1 10*3/uL (ref 0.0–0.7)
Eosinophils Relative: 3.1 % (ref 0.0–5.0)
HCT: 48.3 % (ref 39.0–52.0)
Hemoglobin: 16.1 g/dL (ref 13.0–17.0)
Lymphocytes Relative: 29.4 % (ref 12.0–46.0)
Lymphs Abs: 1.3 10*3/uL (ref 0.7–4.0)
MCHC: 33.3 g/dL (ref 30.0–36.0)
MCV: 92.4 fl (ref 78.0–100.0)
Monocytes Absolute: 0.4 10*3/uL (ref 0.1–1.0)
Monocytes Relative: 8.2 % (ref 3.0–12.0)
Neutro Abs: 2.6 10*3/uL (ref 1.4–7.7)
Neutrophils Relative %: 58.9 % (ref 43.0–77.0)
Platelets: 192 10*3/uL (ref 150.0–400.0)
RBC: 5.23 Mil/uL (ref 4.22–5.81)
RDW: 12.9 % (ref 11.5–14.6)
WBC: 4.4 10*3/uL — ABNORMAL LOW (ref 4.5–10.5)

## 2012-11-25 LAB — COMPREHENSIVE METABOLIC PANEL
ALT: 30 U/L (ref 0–53)
AST: 27 U/L (ref 0–37)
Albumin: 3.7 g/dL (ref 3.5–5.2)
Alkaline Phosphatase: 76 U/L (ref 39–117)
BUN: 16 mg/dL (ref 6–23)
CO2: 29 mEq/L (ref 19–32)
Calcium: 9.7 mg/dL (ref 8.4–10.5)
Chloride: 106 mEq/L (ref 96–112)
Creatinine, Ser: 1.1 mg/dL (ref 0.4–1.5)
GFR: 67.93 mL/min (ref >60.00)
Glucose, Bld: 84 mg/dL (ref 70–99)
Potassium: 3.7 mEq/L (ref 3.5–5.1)
Sodium: 141 mEq/L (ref 135–145)
Total Bilirubin: 0.7 mg/dL (ref 0.3–1.2)
Total Protein: 6.9 g/dL (ref 6.0–8.3)

## 2012-11-25 LAB — LIPID PANEL
Cholesterol: 179 mg/dL (ref 0–200)
HDL: 32.3 mg/dL — ABNORMAL LOW (ref >39.00)
LDL Cholesterol: 115 mg/dL — ABNORMAL HIGH (ref 0–99)
Total CHOL/HDL Ratio: 6
Triglycerides: 161 mg/dL — ABNORMAL HIGH (ref 0.0–149.0)
VLDL: 32.2 mg/dL (ref 0.0–40.0)

## 2012-11-25 LAB — TSH: TSH: 0.77 u[IU]/mL (ref 0.35–5.50)

## 2012-11-25 NOTE — Progress Notes (Signed)
Office Note 11/25/2012  CC:  Chief Complaint  Patient presents with  . Annual Exam    no problems    HPI:  [name] is a 67 y.o. White male who is here for CPE.  I have not seen him since his las CPE a year ago. Getting q63mo urology f/u for hx of prost ca--last PSA 0.5. [facility]--[facility].  He's trying to make a few small lifestyle changes regarding diet and exercise but admits it is a slow process. He'll be going to [place] for a mission trip soon, got Tdap today. He feels well, has no complaints.  Past Medical History  Diagnosis Date  . 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 Hx of adenomatous colonic polyps     polypectomy 2007 and 04/2011 (+adenomatous, no high grade dysplasia)  . Foot drop, left     s/p hip fracture 1969  . Chronic venous insufficiency     +compression hose  . Osteoarthritis     knees and hips primarily    Past Surgical History  Procedure Laterality Date  . Seed implant prostate    . Total hip arthroplasty      left  . Rotator cuff repair      right  . Bilateral carpal tunnel release    . Arthroscopy left shoulder      History reviewed. No pertinent family history.  History   Social History  . Marital Status: Married    Spouse Name: N/A    Number of Children: N/A  . Years of Education: N/A   Occupational History  . Not on file.   Social History Main Topics  . Smoking status: Never Smoker   . Smokeless tobacco: Never Used  . Alcohol Use: No  . Drug Use: No  . Sexually Active: Not on file   Other Topics Concern  . Not on file   Social History Narrative   Married, 2 biologic children, 2 adopted, 5 GC.   Lives in [place].  Worked 35 yrs at [employer].   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   No formal exercise.  No T/A/Ds.   Enjoys woodworking.    Outpatient Prescriptions Prior to Visit  Medication  Sig Dispense Refill  . aspirin 81 MG tablet Take 81 mg by mouth daily.        . Multiple Vitamin (MULTIVITAMIN) tablet Take 1 tablet by mouth daily.        . Omega-3 Fatty Acids (FISH OIL) 500 MG CAPS Take by mouth. 1 tablet daily       . solifenacin (VESICARE) 5 MG tablet Take 5 mg by mouth daily.       No facility-administered medications prior to visit.    No Known Allergies  ROS Review of Systems  Constitutional: Negative for fever, chills, appetite change and fatigue.  HENT: Negative for ear pain, congestion, sore throat, neck stiffness and dental problem.   Eyes: Negative for discharge, redness and visual disturbance.  Respiratory: Negative for cough, chest tightness, shortness of breath and wheezing.   Cardiovascular: Negative for chest pain, palpitations and leg swelling.  Gastrointestinal: Negative for nausea, vomiting, abdominal pain, diarrhea and blood in stool.  Genitourinary: Negative for dysuria, urgency, frequency, hematuria, flank pain and difficulty urinating.  Musculoskeletal: Positive for gait problem (chronic left foot drop). Negative for myalgias, back pain, joint  swelling and arthralgias.  Skin: Negative for pallor and rash.  Neurological: Negative for dizziness, speech difficulty, weakness and headaches.  Hematological: Negative for adenopathy. Does not bruise/bleed easily.  Psychiatric/Behavioral: Negative for confusion and sleep disturbance. The patient is not nervous/anxious.     PE; Blood pressure 160/82, pulse 56, resp. rate 16, height 6\' 3"  (1.905 m), weight 269 lb (122.018 kg). Gen: Alert, well appearing.  Patient is oriented to person, place, time, and situation. AFFECT: pleasant, lucid thought and speech. ENT: Ears: EACs clear, normal epithelium.  TMs with good light reflex and landmarks bilaterally.  Eyes: no injection, icteris, swelling, or exudate.  EOMI, PERRLA. Nose: no drainage or turbinate edema/swelling.  No injection or focal lesion.  Mouth:  lips without lesion/swelling.  Oral mucosa pink and moist.  Dentition intact and without obvious caries or gingival swelling.  Oropharynx without erythema, exudate, or swelling.  Neck: supple/nontender.  No LAD, mass, or TM.  Carotid pulses 2+ bilaterally, without bruits. CV: RRR, no m/r/g.   LUNGS: CTA bilat, nonlabored resps, good aeration in all lung fields. ABD: soft, NT, ND, BS normal.  No hepatospenomegaly or mass.  No bruits. EXT: no clubbing, cyanosis, or edema.  Musculoskeletal: no joint swelling, erythema, warmth, or tenderness.  ROM of all joints intact. Skin - no sores or suspicious lesions or rashes or color changes   Pertinent labs:  None today   ASSESSMENT AND PLAN:   Health maintenance examination Reviewed age and gender appropriate health maintenance issues (prudent diet, regular exercise, health risks of tobacco and excessive alcohol, use of seatbelts, fire alarms in home, use of sunscreen).  Also reviewed age and gender appropriate health screening as well as vaccine recommendations. Tdap today. Fasting health panel today.  Next colonoscopy 2015.  He gets q25mo urology f/u. Check bp daily for the next 4-6 wks and call nurse with report of these. Signs/symptoms to call or return for were reviewed and pt expressed understanding.     FOLLOW UP:  Return in about 1 year (around 11/25/2013) for CPE with fasting labs.

## 2012-11-25 NOTE — Assessment & Plan Note (Addendum)
Reviewed age and gender appropriate health maintenance issues (prudent diet, regular exercise, health risks of tobacco and excessive alcohol, use of seatbelts, fire alarms in home, use of sunscreen).  Also reviewed age and gender appropriate health screening as well as vaccine recommendations. Tdap today. Fasting health panel today.  Next colonoscopy 2015.  He gets q18mo urology f/u. Check bp daily for the next 4-6 wks and call nurse with report of these. Signs/symptoms to call or return for were reviewed and pt expressed understanding.

## 2013-05-27 ENCOUNTER — Ambulatory Visit (HOSPITAL_BASED_OUTPATIENT_CLINIC_OR_DEPARTMENT_OTHER)
Admission: RE | Admit: 2013-05-27 | Discharge: 2013-05-27 | Disposition: A | Discharge status: Home / Self Care | Payer: Medicare Other | Source: Ambulatory Visit | Attending: Family Medicine | Admitting: Family Medicine

## 2013-05-27 ENCOUNTER — Encounter: Payer: Self-pay | Admitting: Family Medicine

## 2013-05-27 ENCOUNTER — Ambulatory Visit (INDEPENDENT_AMBULATORY_CARE_PROVIDER_SITE_OTHER): Payer: Medicare Other | Admitting: Family Medicine

## 2013-05-27 VITALS — BP 130/70 | HR 65 | Temp 97.4°F | Ht 75.0 in | Wt 255.5 lb

## 2013-05-27 DIAGNOSIS — M7989 Other specified soft tissue disorders: Secondary | ICD-10-CM

## 2013-05-27 DIAGNOSIS — G629 Polyneuropathy, unspecified: Secondary | ICD-10-CM

## 2013-05-27 DIAGNOSIS — G609 Hereditary and idiopathic neuropathy, unspecified: Secondary | ICD-10-CM | POA: Insufficient documentation

## 2013-05-27 DIAGNOSIS — L089 Local infection of the skin and subcutaneous tissue, unspecified: Secondary | ICD-10-CM

## 2013-05-27 MED ORDER — AMOXICILLIN-POT CLAVULANATE 875-125 MG PO TABS: 1.0000 | ORAL_TABLET | Freq: Two times a day (BID) | ORAL | Status: DC

## 2013-05-27 NOTE — Assessment & Plan Note (Addendum)
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  Need to treat as infection and will start augmentin 875 mg bid--3 week supply rx'd today in case prolonged course needed. Will check plain films of great toe on left and 2nd toe on right and if no sign of osteo will likely further eval these areas with MRIs---but will see how his blood work looks and see how he responds to abx first. Refer to wound care clinic at [facility]--2nd toe on right foot needs debridement. F/u here in 4d. Labs done here today: CBC, BMET, CRP, HbA1c, Uric acid level.

## 2013-05-27 NOTE — Progress Notes (Signed)
OFFICE NOTE  05/27/2013  CC:  Chief Complaint  Patient presents with  . Foot Swelling     HPI: Patient is a 67 y.o. Caucasian male who is here for here for right great toe swelling x 24h, then noted it was red this morning.  No f/c and no malaise.  It is not painful, but he cites a long hx of progressive loss of sensation in both feet--says sciatic nerve was cut when he broke his left hip long ago (fell off mother's roof--1969, then had THR 01/2007).  Also notes unclear duration of some changes to the tip of his right foot 2nd toe. Has hx of good arterial circ to feet but hx of recurrent phlebitis--says this has caused progressive sensory changes in BOTH feet (?) No polyuria or polyphagia or polydipsia---no hx of DM 2 or hyperglycemia on labs.  Hx of surgery in remote past on great toe on left foot: bone spur removal.  No problems since. No hx of toe or foot infection.  No hx of gout.  Pertinent PMH:  Past Medical History  Diagnosis Date  . 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 Hx of adenomatous colonic polyps     polypectomy 2007 and 04/2011 (+adenomatous, no high grade dysplasia)  . Foot drop, left     s/p hip fracture 1969  . Chronic venous insufficiency     +compression hose  . Osteoarthritis     knees and hips primarily   Past Surgical History  Procedure Laterality Date  . Seed implant prostate    . Total hip arthroplasty      left  . Rotator cuff repair      right  . Bilateral carpal tunnel release    . Arthroscopy left shoulder       MEDS:  Outpatient Prescriptions Prior to Visit  Medication Sig Dispense Refill  . aspirin 81 MG tablet Take 81 mg by mouth daily.        . Omega-3 Fatty Acids (FISH OIL) 500 MG CAPS Take by mouth. 1 tablet daily       . solifenacin (VESICARE) 5 MG tablet Take 5 mg by mouth daily.      . Multiple Vitamin (MULTIVITAMIN) tablet Take 1 tablet by mouth daily.          No facility-administered medications prior to visit.    PE: Blood pressure 130/70, pulse 65, temperature 97.4 F (36.3 C), temperature source Temporal, height 6\' 3"  (1.905 m), weight 255 lb 8 oz (115.894 kg), SpO2 97.00%. Gen: Alert, well appearing.  Patient is oriented to person, place, time, and situation. AFFECT: pleasant, lucid thought and speech. LEGS: trace pretibial and pedeal edema.  DP and PT pulses 2+ bilat.   Some scattered varicosities are present.   Left foot great toe with swelling and erythema of prox and dist phalanges but no swelling or erythema of MTP joint. Nail is atrophic.  Toe is nontender--he essentially says he cannot feel anything when I palpate his toe.  Undersurface of this toe has a superficial area of skin desquamation but no erythema or drainage tract is noted. Great toe circumference at widest point on left great toe is 12.3 cm, on right great toe it is 10.3 cm. Right foot 2nd toe with thick, dark callus-- crusty distal skin on distal phalanx tip, undersurface of this toe with some translucency and whitish appearance.  Nontender.  He has  minimal sensation to this toe as well.  No erythema or significant swelling of this toe.  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  Need to treat as infection and will start augmentin 875 mg bid--3 week supply rx'd today in case prolonged course needed. Will check plain films of great toe on left and 2nd toe on right and if no sign of osteo will likely further eval these areas with MRIs---but will see how his blood work looks and see how he responds to abx first. Refer to wound care clinic at [facility]--2nd toe on right foot needs debridement. F/u here in 4d. Labs done here today: CBC, BMET, CRP, HbA1c, Uric acid level.   An After Visit Summary was printed and given to the  patient.  FOLLOW UP: 4d

## 2013-05-28 LAB — BASIC METABOLIC PANEL
CO2: 27 mEq/L (ref 19–32)
Chloride: 105 mEq/L (ref 96–112)
Glucose, Bld: 101 mg/dL — ABNORMAL HIGH (ref 70–99)
Potassium: 4.2 mEq/L (ref 3.5–5.3)
Sodium: 139 mEq/L (ref 135–145)

## 2013-05-28 LAB — CBC WITH DIFFERENTIAL/PLATELET
Eosinophils Relative: 4 % (ref 0–5)
HCT: 43.6 % (ref 39.0–52.0)
Lymphocytes Relative: 34 % (ref 12–46)
Lymphs Abs: 1.2 10*3/uL (ref 0.7–4.0)
MCV: 89.5 fL (ref 78.0–100.0)
Monocytes Absolute: 0.5 10*3/uL (ref 0.1–1.0)
Monocytes Relative: 15 % — ABNORMAL HIGH (ref 3–12)
RBC: 4.87 MIL/uL (ref 4.22–5.81)
WBC: 3.4 10*3/uL — ABNORMAL LOW (ref 4.0–10.5)

## 2013-05-28 LAB — URIC ACID: Uric Acid, Serum: 7.8 mg/dL (ref 4.0–7.8)

## 2013-05-31 ENCOUNTER — Encounter: Payer: Self-pay | Admitting: Family Medicine

## 2013-05-31 ENCOUNTER — Ambulatory Visit (INDEPENDENT_AMBULATORY_CARE_PROVIDER_SITE_OTHER): Payer: Medicare Other | Admitting: Family Medicine

## 2013-05-31 VITALS — BP 168/76 | HR 54 | Temp 98.4°F | Resp 18 | Ht 75.0 in | Wt 255.0 lb

## 2013-05-31 DIAGNOSIS — M7989 Other specified soft tissue disorders: Secondary | ICD-10-CM

## 2013-05-31 DIAGNOSIS — L089 Local infection of the skin and subcutaneous tissue, unspecified: Secondary | ICD-10-CM | POA: Insufficient documentation

## 2013-05-31 NOTE — Progress Notes (Signed)
OFFICE NOTE  05/31/2013  CC:  Chief Complaint  Patient presents with  . Follow-up    toe swelling     HPI: Patient is a 67 y.o. Caucasian male who is here for recheck of left great toe and right 2nd toe infections. Has bee non abx for about 3 full days now.  Toe is much less red, much more mobile. No fever/chills/malaise.  Tolerating abx fine.  No new symptoms.  Pertinent PMH:  Past Medical History  Diagnosis Date  . 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 Hx of adenomatous colonic polyps     polypectomy 2007 and 04/2011 (+adenomatous, no high grade dysplasia)  . Foot drop, left     s/p hip fracture 1969  . Chronic venous insufficiency     +compression hose  . Osteoarthritis     knees and hips primarily    MEDS:  Outpatient Prescriptions Prior to Visit  Medication Sig Dispense Refill  . amoxicillin-clavulanate (AUGMENTIN) 875-125 MG per tablet Take 1 tablet by mouth 2 (two) times daily.  42 tablet  0  . aspirin 81 MG tablet Take 81 mg by mouth daily.        . Omega-3 Fatty Acids (FISH OIL) 500 MG CAPS Take by mouth. 1 tablet daily       . solifenacin (VESICARE) 5 MG tablet Take 5 mg by mouth daily.      . Multiple Vitamin (MULTIVITAMIN) tablet Take 1 tablet by mouth daily.         No facility-administered medications prior to visit.    PE: Blood pressure 168/76, pulse 54, temperature 98.4 F (36.9 C), temperature source Temporal, resp. rate 18, height 6\' 3"  (1.905 m), weight 255 lb (115.667 kg), SpO2 99.00%. Left great toe with a bit of a hyperpigmented hue in MTP region, with some SLIGHT increase pinkish hue in prox and dist phalanges of this toe compared to the same area on the right foot.  Plantar surface with superficial desquamation but no drainage tract or erythema.  No tenderness to the toe at all. Right foot 2nd toe with distal callus+hyperpigmented areas and a couple of areas of increased  lucency--unchanged compared to prior exam.  No drainage tract or tenderness.  LABS: none. Recent plain films of both of the involved toes showed no plain film evidence to support osteo.  IMPRESSION AND PLAN:  Infected left great toe and right 2nd toe: at this point his exam, recent x-rays, and response to therapy in just 3d support dx of soft tissue infection only.  I have advised him to continue the 21d course of augmentin I rx'd him and to keep appt with wound care clinic that is set up for 06/18/13  FOLLOW UP: 1 mo

## 2013-06-02 ENCOUNTER — Encounter: Payer: Self-pay | Admitting: Family Medicine

## 2013-06-02 DIAGNOSIS — G629 Polyneuropathy, unspecified: Secondary | ICD-10-CM | POA: Insufficient documentation

## 2013-06-18 ENCOUNTER — Encounter (HOSPITAL_BASED_OUTPATIENT_CLINIC_OR_DEPARTMENT_OTHER): Payer: Medicare Other | Attending: General Surgery

## 2013-07-01 ENCOUNTER — Encounter: Payer: Self-pay | Admitting: Family Medicine

## 2013-07-01 ENCOUNTER — Ambulatory Visit (INDEPENDENT_AMBULATORY_CARE_PROVIDER_SITE_OTHER): Payer: Medicare Other | Admitting: Family Medicine

## 2013-07-01 VITALS — BP 162/78 | HR 52 | Temp 98.2°F | Resp 16 | Ht 75.0 in | Wt 260.0 lb

## 2013-07-01 DIAGNOSIS — R030 Elevated blood-pressure reading, without diagnosis of hypertension: Secondary | ICD-10-CM

## 2013-07-01 DIAGNOSIS — L089 Local infection of the skin and subcutaneous tissue, unspecified: Secondary | ICD-10-CM

## 2013-07-01 NOTE — Progress Notes (Signed)
OFFICE NOTE  07/01/2013  CC:  Chief Complaint  Patient presents with  . Follow-up     HPI: Patient is a 67 y.o. Caucasian male who is here for 1 mo f/u infection of left foot great toe and right foot 2nd toe. Toes doing well. Was seen at wound care center, no open wound so they wondered what he was doing there. He has finished 3 wk course of augmentin--feels great.  His blood pressure when he donates blood is always <140/90 per his report, plus he did several checks at home and these were all normal.  His bp is up here usually---likely white coat HTN.  Pertinent PMH:  Past Medical History  Diagnosis Date  . 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 Hx of adenomatous colonic polyps     polypectomy 2007 and 04/2011 (+adenomatous, no high grade dysplasia)  . Foot drop, left     s/p hip fracture 1969  . Chronic venous insufficiency     +compression hose  . Osteoarthritis     knees and hips primarily  . Toe infection 05/27/2013    Left great toe and right 2nd toe   Past Surgical History  Procedure Laterality Date  . Seed implant prostate    . Total hip arthroplasty      left  . Rotator cuff repair      right  . Bilateral carpal tunnel release    . Arthroscopy left shoulder     Past family and social history reviewed and there are no changes since the patient's last office visit with me.  MEDS:  Vesicare 5mg  qd, MVI qd, ASA 81mg  qd  PE: Blood pressure 162/78, pulse 52, temperature 98.2 F (36.8 C), temperature source Temporal, resp. rate 16, height 6\' 3"  (1.905 m), weight 260 lb (117.935 kg), SpO2 100.00%. FEET: left great toe without any erythema, swelling, or tenderness.   Right foot 2nd toe with only a bit of callus distally.  Tissue is pink, nail is normal.     IMPRESSION AND PLAN:  Toe infection Resolved left foot great toe infection. His right foot 2nd toe never had an infection.  The  thickened/callused tissue at distal aspect of this toe is from excessive rubbing from great toe next to it (I thought it may have been mild eschar tissue overlying an abscess.. This is better with cream and some periodic clipping of the callus.  It looks much better today.  Wound care center was very helpful, although they ended up not having to cut anything and he says they didn't even charge him.  Elevated blood pressure reading without diagnosis of hypertension White coat HTN. Monitor and report to [place] if bp at home or blood bank is elevated.   An After Visit Summary was printed and given to the patient.  FOLLOW UP: prn

## 2013-07-01 NOTE — Assessment & Plan Note (Signed)
Resolved left foot great toe infection. His right foot 2nd toe never had an infection.  The thickened/callused tissue at distal aspect of this toe is from excessive rubbing from great toe next to it (I thought it may have been mild eschar tissue overlying an abscess.. This is better with cream and some periodic clipping of the callus.  It looks much better today.  Wound care center was very helpful, although they ended up not having to cut anything and he says they didn't even charge him.

## 2013-07-01 NOTE — Assessment & Plan Note (Signed)
White coat HTN. Monitor and report to [place] if bp at home or blood bank is elevated.

## 2013-09-06 DIAGNOSIS — S43014A Anterior dislocation of right humerus, initial encounter: Secondary | ICD-10-CM

## 2013-09-06 HISTORY — DX: Anterior dislocation of right humerus, initial encounter: S43.014A

## 2013-09-11 ENCOUNTER — Emergency Department (HOSPITAL_BASED_OUTPATIENT_CLINIC_OR_DEPARTMENT_OTHER)
Admission: EM | Admit: 2013-09-11 | Discharge: 2013-09-11 | Disposition: A | Discharge status: Home / Self Care | Payer: Medicare Other | Attending: Emergency Medicine | Admitting: Emergency Medicine

## 2013-09-11 ENCOUNTER — Emergency Department (HOSPITAL_BASED_OUTPATIENT_CLINIC_OR_DEPARTMENT_OTHER): Payer: Medicare Other

## 2013-09-11 ENCOUNTER — Encounter (HOSPITAL_BASED_OUTPATIENT_CLINIC_OR_DEPARTMENT_OTHER): Payer: Self-pay | Admitting: Emergency Medicine

## 2013-09-11 DIAGNOSIS — M1710 Unilateral primary osteoarthritis, unspecified knee: Secondary | ICD-10-CM | POA: Insufficient documentation

## 2013-09-11 DIAGNOSIS — Z872 Personal history of diseases of the skin and subcutaneous tissue: Secondary | ICD-10-CM | POA: Insufficient documentation

## 2013-09-11 DIAGNOSIS — Z8781 Personal history of (healed) traumatic fracture: Secondary | ICD-10-CM | POA: Insufficient documentation

## 2013-09-11 DIAGNOSIS — S43004A Unspecified dislocation of right shoulder joint, initial encounter: Secondary | ICD-10-CM

## 2013-09-11 DIAGNOSIS — Y929 Unspecified place or not applicable: Secondary | ICD-10-CM | POA: Insufficient documentation

## 2013-09-11 DIAGNOSIS — Z79899 Other long term (current) drug therapy: Secondary | ICD-10-CM | POA: Insufficient documentation

## 2013-09-11 DIAGNOSIS — M1610 Unilateral primary osteoarthritis, unspecified hip: Secondary | ICD-10-CM | POA: Insufficient documentation

## 2013-09-11 DIAGNOSIS — Z7982 Long term (current) use of aspirin: Secondary | ICD-10-CM | POA: Insufficient documentation

## 2013-09-11 DIAGNOSIS — Z8679 Personal history of other diseases of the circulatory system: Secondary | ICD-10-CM | POA: Insufficient documentation

## 2013-09-11 DIAGNOSIS — M169 Osteoarthritis of hip, unspecified: Secondary | ICD-10-CM | POA: Insufficient documentation

## 2013-09-11 DIAGNOSIS — Y9301 Activity, walking, marching and hiking: Secondary | ICD-10-CM | POA: Insufficient documentation

## 2013-09-11 DIAGNOSIS — Z860100 Personal history of colon polyps, unspecified: Secondary | ICD-10-CM | POA: Insufficient documentation

## 2013-09-11 DIAGNOSIS — S43016A Anterior dislocation of unspecified humerus, initial encounter: Secondary | ICD-10-CM | POA: Insufficient documentation

## 2013-09-11 DIAGNOSIS — Z792 Long term (current) use of antibiotics: Secondary | ICD-10-CM | POA: Insufficient documentation

## 2013-09-11 DIAGNOSIS — Z8546 Personal history of malignant neoplasm of prostate: Secondary | ICD-10-CM | POA: Insufficient documentation

## 2013-09-11 DIAGNOSIS — IMO0002 Reserved for concepts with insufficient information to code with codable children: Secondary | ICD-10-CM | POA: Insufficient documentation

## 2013-09-11 DIAGNOSIS — W010XXA Fall on same level from slipping, tripping and stumbling without subsequent striking against object, initial encounter: Secondary | ICD-10-CM | POA: Insufficient documentation

## 2013-09-11 MED ORDER — PROPOFOL 10 MG/ML IV EMUL
10.0000 mL | Freq: Once | INTRAVENOUS | Status: AC
  Administered 2013-09-11: 100 mg via INTRAVENOUS

## 2013-09-11 MED ORDER — PROPOFOL 10 MG/ML IV BOLUS
INTRAVENOUS | Status: AC | PRN
  Administered 2013-09-11: 10 mg via INTRAVENOUS

## 2013-09-11 MED ORDER — HYDROCODONE-ACETAMINOPHEN 5-325 MG PO TABS: 1.0000 | ORAL_TABLET | ORAL | Status: DC | PRN

## 2013-09-11 MED ORDER — PROPOFOL 10 MG/ML IV BOLUS
INTRAVENOUS | Status: AC
  Filled 2013-09-11: qty 20

## 2013-09-11 NOTE — ED Notes (Signed)
Pt having portable x-ray done.

## 2013-09-11 NOTE — ED Notes (Signed)
No other injuries were verbalized.

## 2013-09-11 NOTE — ED Notes (Signed)
Pt fell today, dislocated right shoulder.  Pt was sent here from another hospital to have it reduced.  Pt having a lot of pain.

## 2013-09-11 NOTE — ED Notes (Signed)
rx x 1 given for norco. D/c home with family to drive

## 2013-09-11 NOTE — Discharge Instructions (Signed)
Shoulder Dislocation ° Shoulder dislocation is when your upper arm bone (humerus) is forced out of your shoulder joint. Your doctor will put your shoulder back into the joint by pulling on your arm or through surgery. Your arm will be placed in a shoulder immobilizer or sling. The shoulder immobilizer or sling holds your shoulder in place while it heals. °HOME CARE  °· Rest your injured joint. Do not move it until instructed to do so. °· Put ice on your injured joint as told by your doctor. °· Put ice in a plastic bag. °· Place a towel between your skin and the bag. °· Leave the ice on for 15-20 minutes at a time, every 2 hours while you are awake. °· Only take medicines as told by your doctor. °· Squeeze a ball to exercise your hand. °GET HELP RIGHT AWAY IF:  °· Your splint or sling becomes damaged. °· Your pain becomes worse, not better. °· You lose feeling in your arm or hand. °· Your arm or hand becomes white or cold. °MAKE SURE YOU:  °· Understand these instructions. °· Will watch your condition. °· Will get help right away if you are not doing well or get worse. °Document Released: 12/16/2011 Document Reviewed: 12/16/2011 °[employer]® Patient Information ©2014 [employer]. ° °

## 2013-09-11 NOTE — ED Provider Notes (Signed)
CSN: [id number]     Arrival date & time 09/11/13  1546 History   First MD Initiated Contact with Patient 09/11/13 1627     Chief Complaint  Patient presents with  . Shoulder Injury  . Dislocation    HPI  The patient was getting her Christmas tree. He was walking to the car pulling history behind him. His feet slip. He fell onto an outstretched right arm as he fell downward onto a sitting position. He was seen and had an x-ray and was diagnosed with a right anterior shoulder dislocation at a small clinic in [place].  Past Medical History  Diagnosis Date  . 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 Hx of adenomatous colonic polyps     polypectomy 2007 and 04/2011 (+adenomatous, no high grade dysplasia)  . Foot drop, left     s/p hip fracture 1969  . Chronic venous insufficiency     +compression hose  . Osteoarthritis     knees and hips primarily  . Toe infection 05/27/2013    Left great toe and right 2nd toe   Past Surgical History  Procedure Laterality Date  . Seed implant prostate    . Total hip arthroplasty      left  . Rotator cuff repair      right  . Bilateral carpal tunnel release    . Arthroscopy left shoulder     History reviewed. No pertinent family history. History  Substance Use Topics  . Smoking status: Never Smoker   . Smokeless tobacco: Never Used  . Alcohol Use: No    Review of Systems  Constitutional: Negative for fever, chills, diaphoresis, appetite change and fatigue.  HENT: Negative for mouth sores, sore throat and trouble swallowing.   Eyes: Negative for visual disturbance.  Respiratory: Negative for cough, chest tightness, shortness of breath and wheezing.   Cardiovascular: Negative for chest pain.  Gastrointestinal: Negative for nausea, vomiting, abdominal pain, diarrhea and abdominal distention.  Endocrine: Negative for polydipsia, polyphagia and polyuria.  Genitourinary:  Negative for dysuria, frequency and hematuria.  Musculoskeletal: Positive for arthralgias and joint swelling. Negative for gait problem.  Skin: Negative for color change, pallor and rash.  Neurological: Negative for dizziness, syncope, light-headedness and headaches.  Hematological: Does not bruise/bleed easily.  Psychiatric/Behavioral: Negative for behavioral problems and confusion.    Allergies  Review of patient's allergies indicates no known allergies.  Home Medications   Current Outpatient Rx  Name  Route  Sig  Dispense  Refill  . amoxicillin-clavulanate (AUGMENTIN) 875-125 MG per tablet   Oral   Take 1 tablet by mouth 2 (two) times daily.   42 tablet   0   . aspirin 81 MG tablet   Oral   Take 81 mg by mouth daily.           [name] HYDROcodone-acetaminophen (NORCO/VICODIN) 5-325 MG per tablet   Oral   Take 1 tablet by mouth every 4 (four) hours as needed.   10 tablet   0   . Omega-3 Fatty Acids (FISH OIL) 500 MG CAPS   Oral   Take by mouth. 1 tablet daily          . solifenacin (VESICARE) 5 MG tablet   Oral   Take 5 mg by mouth daily.          BP 158/70  Pulse 57  Temp(Src) 97.6 F (36.4 C)  Resp  18  Ht 6\' 3"  (1.905 m)  Wt 255 lb (115.667 kg)  BMI 31.87 kg/m2  SpO2 99% Physical Exam  Constitutional: He is oriented to person, place, and time. He appears well-developed and well-nourished. No distress.  HENT:  Head: Normocephalic.  Eyes: Conjunctivae are normal. Pupils are equal, round, and reactive to light. No scleral icterus.  Neck: Normal range of motion. Neck supple. No thyromegaly present.  Cardiovascular: Normal rate and regular rhythm.  Exam reveals no gallop and no friction rub.   No murmur heard. Pulmonary/Chest: Effort normal and breath sounds normal. No respiratory distress. He has no wheezes. He has no rales.  Abdominal: Soft. Bowel sounds are normal. He exhibits no distension. There is no tenderness. There is no rebound.  Musculoskeletal:  Normal range of motion.  Palpable and clinical right anterior subcoracoid shoulder dislocation. Normal neurovascular exam to the upper extremity. Normal axillary nerve distribution sensation.  Neurological: He is alert and oriented to person, place, and time.  Skin: Skin is warm and dry. No rash noted.  Psychiatric: He has a normal mood and affect. His behavior is normal.    ED Course  Procedural sedation Date/Time: 09/11/2013 5:00 PM Performed by: Roney Marion Authorized by: Rolland Porter J Consent: Verbal consent obtained. written consent obtained. Risks and benefits: risks, benefits and alternatives were discussed Consent given by: patient and spouse Patient understanding: patient states understanding of the procedure being performed Patient consent: the patient's understanding of the procedure matches consent given Procedure consent: procedure consent matches procedure scheduled Relevant documents: relevant documents present and verified Patient identity confirmed: verbally with patient and arm band Time out: Immediately prior to procedure a "time out" was called to verify the correct patient, procedure, equipment, support staff and site/side marked as required. Patient sedated: yes Sedatives: propofol Sedation start date/time: 09/11/2013 5:00 PM Sedation end date/time: 09/11/2013 5:20 PM Vitals: Vital signs were monitored during sedation. Patient tolerance: Patient tolerated the procedure well with no immediate complications.  Reduction of dislocation Date/Time: 09/11/2013 5:05 PM Performed by: Roney Marion Authorized by: Rolland Porter J Consent: Verbal consent obtained. Risks and benefits: risks, benefits and alternatives were discussed Consent given by: patient Patient understanding: patient states understanding of the procedure being performed Patient consent: the patient's understanding of the procedure matches consent given Procedure consent: procedure consent matches  procedure scheduled Relevant documents: relevant documents present and verified Patient identity confirmed: verbally with patient and arm band Time out: Immediately prior to procedure a "time out" was called to verify the correct patient, procedure, equipment, support staff and site/side marked as required. Patient sedated: yes Sedatives: propofol Comments: Joint was easily reduced using scapular manipulation and minimal traction. Post procedure x-ray showed proper position. No complications from sedation.   (including critical care time) Labs Review Labs Reviewed - No data to display Imaging Review Dg Shoulder Right Port  09/11/2013   CLINICAL DATA:  Status post reduction of a right shoulder dislocation.  EXAM: PORTABLE RIGHT SHOULDER - 2+ VIEW  COMPARISON:  None.  FINDINGS: A single portable frontal view of the right shoulder is submitted for interpretation. The humeral head appears normally located on this view. There are corticated bone fragments inferior to the acromion.  IMPRESSION: Limited examination demonstrating no evidence of fracture or dislocation.   Electronically Signed   By: Gordan Payment M.D.   On: 09/11/2013 17:55    EKG Interpretation   None       MDM   1. Shoulder dislocation, right,  initial encounter    Shoulder relocates without difficulty. Tolerating symptoms well. Postprocedure x-ray shows good relocation. He maintained sensation throughout. Plan is sling until pain free treatment ice Vicodin for pain   Roney Marion, MD 09/11/13 [phone]

## 2013-09-17 ENCOUNTER — Encounter: Payer: Self-pay | Admitting: Family Medicine

## 2013-11-26 ENCOUNTER — Encounter: Payer: Medicare Other | Admitting: Family Medicine

## 2013-11-30 ENCOUNTER — Encounter: Payer: Medicare Other | Admitting: Family Medicine

## 2013-12-10 ENCOUNTER — Encounter: Payer: Self-pay | Admitting: Family Medicine

## 2013-12-10 ENCOUNTER — Ambulatory Visit (INDEPENDENT_AMBULATORY_CARE_PROVIDER_SITE_OTHER): Payer: Medicare Other | Admitting: Family Medicine

## 2013-12-10 VITALS — BP 135/90 | HR 54 | Temp 98.1°F | Resp 18 | Ht 75.0 in | Wt 261.0 lb

## 2013-12-10 DIAGNOSIS — Z23 Encounter for immunization: Secondary | ICD-10-CM

## 2013-12-10 DIAGNOSIS — N182 Chronic kidney disease, stage 2 (mild): Secondary | ICD-10-CM

## 2013-12-10 DIAGNOSIS — E785 Hyperlipidemia, unspecified: Secondary | ICD-10-CM

## 2013-12-10 DIAGNOSIS — Z0000 Encounter for general adult medical examination without abnormal findings: Secondary | ICD-10-CM

## 2013-12-10 LAB — BASIC METABOLIC PANEL
BUN: 19 mg/dL (ref 6–23)
CO2: 26 mEq/L (ref 19–32)
CREATININE: 1.2 mg/dL (ref 0.4–1.5)
Calcium: 10.2 mg/dL (ref 8.4–10.5)
Chloride: 106 mEq/L (ref 96–112)
GFR: 64.44 mL/min (ref >60.00)
Glucose, Bld: 82 mg/dL (ref 70–99)
Potassium: 5 mEq/L (ref 3.5–5.1)
Sodium: 138 mEq/L (ref 135–145)

## 2013-12-10 LAB — LIPID PANEL
CHOLESTEROL: 175 mg/dL (ref 0–200)
HDL: 36.1 mg/dL — ABNORMAL LOW (ref >39.00)
LDL Cholesterol: 115 mg/dL — ABNORMAL HIGH (ref 0–99)
TRIGLYCERIDES: 119 mg/dL (ref 0.0–149.0)
Total CHOL/HDL Ratio: 5
VLDL: 23.8 mg/dL (ref 0.0–40.0)

## 2013-12-10 NOTE — Progress Notes (Signed)
Pre visit review using our clinic review tool, if applicable. No additional management support is needed unless otherwise documented below in the visit note. 

## 2013-12-10 NOTE — Addendum Note (Signed)
Addended by: Julieta Bellini on: 12/10/2013 09:21 AM   Modules accepted: Orders

## 2013-12-10 NOTE — Progress Notes (Signed)
Office Note 12/10/2013  CC:  Chief Complaint  Patient presents with  . Annual Exam    HPI:  [name] is a 68 y.o. White male who is here for fasting CPE. Right shoulder feeling back to normal now since anterior dislocation 09/2013. No complaints.  Feeling good and trying to eat well and be active. Feet and lower legs feel good. He still sees his urologist regularly for f/u prostate cancer.   Past Medical History  Diagnosis Date  . 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 Hx of adenomatous colonic polyps     polypectomy 2007 and 04/2011 (+adenomatous, no high grade dysplasia)  . Foot drop, left     s/p hip fracture 1969  . Chronic venous insufficiency     +compression hose  . Osteoarthritis     knees and hips primarily  . Toe infection 05/27/2013    Left great toe and right 2nd toe  . Anterior dislocation of right shoulder 09/2013    Reduced in ED under sedation    Past Surgical History  Procedure Laterality Date  . Seed implant prostate    . Total hip arthroplasty      left  . Rotator cuff repair      right  . Bilateral carpal tunnel release    . Arthroscopy left shoulder      No family history on file.  History   Social History  . Marital Status: Married    Spouse Name: N/A    Number of Children: N/A  . Years of Education: N/A   Occupational History  . Not on file.   Social History Main Topics  . Smoking status: Never Smoker   . Smokeless tobacco: Never Used  . Alcohol Use: No  . Drug Use: No  . Sexual Activity: Not on file   Other Topics Concern  . Not on file   Social History Narrative   Married, 2 biologic children, 2 adopted, 5 GC.   Lives in [place].  Worked 35 yrs at [employer].   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   No formal exercise.  No T/A/Ds.   Enjoys woodworking.   MEDS: vesicare 5mg  qd, ASA 81mg  qd  No Known  Allergies  ROS Review of Systems  Constitutional: Negative for fever, chills, appetite change and fatigue.  HENT: Negative for congestion, dental problem, ear pain and sore throat.   Eyes: Negative for discharge, redness and visual disturbance.  Respiratory: Negative for cough, chest tightness, shortness of breath and wheezing.   Cardiovascular: Negative for chest pain, palpitations and leg swelling.  Gastrointestinal: Negative for nausea, vomiting, abdominal pain, diarrhea and blood in stool.  Genitourinary: Negative for dysuria, urgency, frequency, hematuria, flank pain and difficulty urinating.  Musculoskeletal: Negative for arthralgias, back pain, joint swelling, myalgias and neck stiffness.  Skin: Negative for pallor and rash.  Neurological: Negative for dizziness, speech difficulty, weakness and headaches.  Hematological: Negative for adenopathy. Does not bruise/bleed easily.  Psychiatric/Behavioral: Negative for confusion and sleep disturbance. The patient is not nervous/anxious.     PE; Blood pressure 135/90, pulse 54, temperature 98.1 F (36.7 C), temperature source Temporal, resp. rate 18, height 6\' 3"  (1.905 m), weight 261 lb (118.389 kg), SpO2 100.00%. Gen: Alert, well appearing.  Patient is oriented to person, place, time, and situation. AFFECT: pleasant, lucid thought and speech. ENT: Ears: EACs clear, normal epithelium.  TMs with good light reflex and landmarks bilaterally.  Eyes: no injection, icteris, swelling, or exudate.  EOMI, PERRLA. Nose: no drainage or turbinate edema/swelling.  No injection or focal lesion.  Mouth: lips without lesion/swelling.  Oral mucosa pink and moist.  Dentition intact and without obvious caries or gingival swelling.  Oropharynx without erythema, exudate, or swelling.  Neck: supple/nontender.  No LAD, mass, or TM.  Carotid pulses 2+ bilaterally, without bruits. CV: RRR, no m/r/g.   LUNGS: CTA bilat, nonlabored resps, good aeration in all lung  fields. ABD: soft, NT, ND, BS normal.  No hepatospenomegaly or mass.  No bruits. EXT: no clubbing, cyanosis, or edema.  Musculoskeletal: no joint swelling, erythema, warmth, or tenderness.  ROM of all joints intact. Skin - no sores or suspicious lesions or rashes or color changes   Pertinent labs:  None today  ASSESSMENT AND PLAN:   Health maintenance examination Reviewed age and gender appropriate health maintenance issues (prudent diet, regular exercise, health risks of tobacco and excessive alcohol, use of seatbelts, fire alarms in home, use of sunscreen).  Also reviewed age and gender appropriate health screening as well as vaccine recommendations. Prevnar 13 today. F/u FLP and BMET today. Next colonoscopy for f/u adenomatous polyps should be in 2017.   An After Visit Summary was printed and given to the patient.  FOLLOW UP:  Return in about 1 year (around 12/11/2014) for annual CPE with fasting labs the week prior.

## 2013-12-10 NOTE — Assessment & Plan Note (Addendum)
Reviewed age and gender appropriate health maintenance issues (prudent diet, regular exercise, health risks of tobacco and excessive alcohol, use of seatbelts, fire alarms in home, use of sunscreen).  Also reviewed age and gender appropriate health screening as well as vaccine recommendations. Prevnar 13 today. F/u FLP and BMET today. Next colonoscopy for f/u adenomatous polyps should be in 2017.

## 2013-12-13 ENCOUNTER — Encounter: Payer: Self-pay | Admitting: Family Medicine

## 2013-12-13 NOTE — Progress Notes (Signed)
Noted  

## 2014-01-26 ENCOUNTER — Encounter: Payer: Self-pay | Admitting: Internal Medicine

## 2014-02-14 ENCOUNTER — Encounter: Payer: Self-pay | Admitting: Internal Medicine

## 2014-03-29 ENCOUNTER — Ambulatory Visit (AMBULATORY_SURGERY_CENTER): Payer: Self-pay

## 2014-03-29 VITALS — Ht 75.0 in | Wt 264.4 lb

## 2014-03-29 DIAGNOSIS — Z86010 Personal history of colonic polyps: Secondary | ICD-10-CM

## 2014-03-29 MED ORDER — MOVIPREP 100 G PO SOLR: ORAL | Status: DC

## 2014-03-29 NOTE — Progress Notes (Signed)
Per pt, no allergies to soy or egg products.Pt not taking any weight loss meds or using  O2 at home. 

## 2014-04-06 ENCOUNTER — Encounter: Payer: Self-pay | Admitting: Internal Medicine

## 2014-04-12 ENCOUNTER — Ambulatory Visit (AMBULATORY_SURGERY_CENTER): Payer: Medicare Other | Admitting: Internal Medicine

## 2014-04-12 ENCOUNTER — Encounter: Payer: Self-pay | Admitting: Internal Medicine

## 2014-04-12 VITALS — BP 114/72 | HR 49 | Temp 97.7°F | Resp 18 | Ht 75.0 in | Wt 264.0 lb

## 2014-04-12 DIAGNOSIS — D126 Benign neoplasm of colon, unspecified: Secondary | ICD-10-CM

## 2014-04-12 DIAGNOSIS — Z86010 Personal history of colonic polyps: Secondary | ICD-10-CM

## 2014-04-12 MED ORDER — SODIUM CHLORIDE 0.9 % IV SOLN: 500.0000 mL | INTRAVENOUS | Status: DC

## 2014-04-12 NOTE — Progress Notes (Signed)
A/ox3 pleased with MAC, report to Wendy RN 

## 2014-04-12 NOTE — Patient Instructions (Signed)
YOU HAD AN ENDOSCOPIC PROCEDURE TODAY AT THE [facility]: Refer to the procedure report that was given to you for any specific questions about what was found during the examination.  If the procedure report does not answer your questions, please call your gastroenterologist to clarify.  If you requested that your care partner not be given the details of your procedure findings, then the procedure report has been included in a sealed envelope for you to review at your convenience later.  YOU SHOULD EXPECT: Some feelings of bloating in the abdomen. Passage of more gas than usual.  Walking can help get rid of the air that was put into your GI tract during the procedure and reduce the bloating. If you had a lower endoscopy (such as a colonoscopy or flexible sigmoidoscopy) you may notice spotting of blood in your stool or on the toilet paper. If you underwent a bowel prep for your procedure, then you may not have a normal bowel movement for a few days.  DIET: Your first meal following the procedure should be a light meal and then it is ok to progress to your normal diet.  A half-sandwich or bowl of soup is an example of a good first meal.  Heavy or fried foods are harder to digest and may make you feel nauseous or bloated.  Likewise meals heavy in dairy and vegetables can cause extra gas to form and this can also increase the bloating.  Drink plenty of fluids but you should avoid alcoholic beverages for 24 hours.  ACTIVITY: Your care partner should take you home directly after the procedure.  You should plan to take it easy, moving slowly for the rest of the day.  You can resume normal activity the day after the procedure however you should NOT DRIVE or use heavy machinery for 24 hours (because of the sedation medicines used during the test).    SYMPTOMS TO REPORT IMMEDIATELY: A gastroenterologist can be reached at any hour.  During normal business hours, 8:30 AM to 5:00 PM Monday through Friday,  call [phone].  After hours and on weekends, please call the GI answering service at [phone] who will take a message and have the physician on call contact you.   Following lower endoscopy (colonoscopy or flexible sigmoidoscopy):  Excessive amounts of blood in the stool  Significant tenderness or worsening of abdominal pains  Swelling of the abdomen that is new, acute  Fever of 100F or higher  FOLLOW UP: If any biopsies were taken you will be contacted by phone or by letter within the next 1-3 weeks.  Call your gastroenterologist if you have not heard about the biopsies in 3 weeks.  Our staff will call the home number listed on your records the next business day following your procedure to check on you and address any questions or concerns that you may have at that time regarding the information given to you following your procedure. This is a courtesy call and so if there is no answer at the home number and we have not heard from you through the emergency physician on call, we will assume that you have returned to your regular daily activities without incident.  SIGNATURES/CONFIDENTIALITY: You and/or your care partner have signed paperwork which will be entered into your electronic medical record.  These signatures attest to the fact that that the information above on your After Visit Summary has been reviewed and is understood.  Full responsibility of the confidentiality of this   discharge information lies with you and/or your care-partner.  Recommendations Follow up colonoscopy in 5 years 

## 2014-04-12 NOTE — Progress Notes (Signed)
Called to room to assist during endoscopic procedure.  Patient ID and intended procedure confirmed with present staff. Received instructions for my participation in the procedure from the performing physician.  

## 2014-04-12 NOTE — Op Note (Signed)
[facility]  [address] [place], [id number]   COLONOSCOPY PROCEDURE REPORT  PATIENT: [name], [name]  MR#: [id number] BIRTHDATE: 1945-10-28 , 68  yrs. old GENDER: Male ENDOSCOPIST: Eustace Quail, MD REFERRED CB:[id number] Program Recall PROCEDURE DATE:  04/12/2014 PROCEDURE:   Colonoscopy with snare polypectomy x 1 First Screening Colonoscopy - Avg.  risk and is 50 yrs.  old or older - No.  Prior Negative Screening - Now for repeat screening. N/A  History of Adenoma - Now for follow-up colonoscopy & has been > or = to 3 yrs.  Yes hx of adenoma.  Has been 3 or more years since last colonoscopy.  Polyps Removed Today? Yes. ASA CLASS:   Class II INDICATIONS:Patient's personal history of adenomatous colon polyps. Index 2007 (small TA); 2010 (multiple and advanced TAs) MEDICATIONS: MAC sedation, administered by CRNA and propofol (Diprivan) 250mg  IV  DESCRIPTION OF PROCEDURE:   After the risks benefits and alternatives of the procedure were thoroughly explained, informed consent was obtained.  A digital rectal exam revealed no abnormalities of the rectum.   The LB [id number]  endoscope was introduced through the anus and advanced to the cecum, which was identified by both the appendix and ileocecal valve. No adverse events experienced.   The quality of the prep was excellent, using MoviPrep  The instrument was then slowly withdrawn as the colon was fully examined.  COLON FINDINGS: A diminutive polyp was found in the descending colon.  A polypectomy was performed with a cold snare.  The resection was complete and the polyp tissue was completely retrieved.   Mild diverticulosis was noted in the sigmoid colon. The colon mucosa was otherwise normal.  Retroflexed views revealed no abnormalities. The time to cecum=2 minutes 04 seconds. Withdrawal time=9 minutes 23 seconds.  The scope was withdrawn and the procedure completed. COMPLICATIONS: There were no  complications.  ENDOSCOPIC IMPRESSION: 1.   Diminutive polyp was found in the descending colon; polypectomy was performed with a cold snare 2.   Mild diverticulosis was noted in the sigmoid colon 3.   The colon mucosa was otherwise normal  RECOMMENDATIONS: 1. Follow up colonoscopy in 5 years   eSigned:  Eustace Quail, MD 04/12/2014 11:13 AM   cc: The Patient and Ricardo Jericho, MD

## 2014-04-13 ENCOUNTER — Telehealth: Payer: Self-pay

## 2014-04-13 NOTE — Telephone Encounter (Signed)
  Follow up Call-  Call back number 04/12/2014  Post procedure Call Back phone  # [phone]  Permission to leave phone message Yes     Patient questions:  Do you have a fever, pain , or abdominal swelling? No. Pain Score  0 *  Have you tolerated food without any problems? Yes.    Have you been able to return to your normal activities? No.  Do you have any questions about your discharge instructions: Diet   No. Medications  No. Follow up visit  No.  Do you have questions or concerns about your Care? No.  Actions: * If pain score is 4 or above: No action needed, pain <4.

## 2014-04-19 ENCOUNTER — Encounter: Payer: Self-pay | Admitting: Internal Medicine

## 2014-08-23 ENCOUNTER — Encounter: Payer: Self-pay | Admitting: Family Medicine

## 2014-12-01 ENCOUNTER — Other Ambulatory Visit: Payer: Self-pay | Admitting: Orthopedic Surgery

## 2014-12-01 DIAGNOSIS — M25511 Pain in right shoulder: Secondary | ICD-10-CM

## 2014-12-13 ENCOUNTER — Encounter: Payer: Medicare Other | Admitting: Family Medicine

## 2014-12-13 ENCOUNTER — Encounter: Payer: Self-pay | Admitting: Family Medicine

## 2014-12-20 ENCOUNTER — Other Ambulatory Visit: Payer: Self-pay

## 2014-12-20 ENCOUNTER — Ambulatory Visit
Admission: RE | Admit: 2014-12-20 | Discharge: 2014-12-20 | Disposition: A | Discharge status: Home / Self Care | Payer: Medicare Other | Source: Ambulatory Visit | Attending: Orthopedic Surgery | Admitting: Orthopedic Surgery

## 2014-12-20 DIAGNOSIS — M25511 Pain in right shoulder: Secondary | ICD-10-CM

## 2014-12-20 MED ORDER — IOHEXOL 180 MG/ML  SOLN
15.0000 mL | Freq: Once | INTRAMUSCULAR | Status: AC | PRN
  Administered 2014-12-20: 15 mL via INTRA_ARTICULAR

## 2015-02-08 ENCOUNTER — Encounter: Payer: Self-pay | Admitting: Family Medicine

## 2015-02-08 ENCOUNTER — Ambulatory Visit (INDEPENDENT_AMBULATORY_CARE_PROVIDER_SITE_OTHER): Payer: Medicare Other | Admitting: Family Medicine

## 2015-02-08 VITALS — BP 150/76 | HR 53 | Temp 97.7°F | Resp 16 | Ht 73.5 in | Wt 255.0 lb

## 2015-02-08 DIAGNOSIS — Z0000 Encounter for general adult medical examination without abnormal findings: Secondary | ICD-10-CM

## 2015-02-08 DIAGNOSIS — Z8546 Personal history of malignant neoplasm of prostate: Secondary | ICD-10-CM | POA: Diagnosis not present

## 2015-02-08 DIAGNOSIS — IMO0001 Reserved for inherently not codable concepts without codable children: Secondary | ICD-10-CM

## 2015-02-08 LAB — CBC WITH DIFFERENTIAL/PLATELET
Basophils Absolute: 0 10*3/uL (ref 0.0–0.1)
Basophils Relative: 0.4 % (ref 0.0–3.0)
EOS ABS: 0.2 10*3/uL (ref 0.0–0.7)
EOS PCT: 4.1 % (ref 0.0–5.0)
HEMATOCRIT: 47.8 % (ref 39.0–52.0)
Hemoglobin: 16 g/dL (ref 13.0–17.0)
LYMPHS ABS: 1.5 10*3/uL (ref 0.7–4.0)
Lymphocytes Relative: 28.1 % (ref 12.0–46.0)
MCHC: 33.5 g/dL (ref 30.0–36.0)
MCV: 89.3 fl (ref 78.0–100.0)
Monocytes Absolute: 0.4 10*3/uL (ref 0.1–1.0)
Monocytes Relative: 7.7 % (ref 3.0–12.0)
Neutro Abs: 3.1 10*3/uL (ref 1.4–7.7)
Neutrophils Relative %: 59.7 % (ref 43.0–77.0)
Platelets: 210 10*3/uL (ref 150.0–400.0)
RBC: 5.36 Mil/uL (ref 4.22–5.81)
RDW: 15.1 % (ref 11.5–15.5)
WBC: 5.3 10*3/uL (ref 4.0–10.5)

## 2015-02-08 LAB — PSA: PSA: 0.21 ng/mL (ref 0.10–4.00)

## 2015-02-08 LAB — LIPID PANEL
CHOL/HDL RATIO: 5
Cholesterol: 177 mg/dL (ref 0–200)
HDL: 35.8 mg/dL — AB (ref >39.00)
LDL CALC: 118 mg/dL — AB (ref 0–99)
NonHDL: 141.2
Triglycerides: 117 mg/dL (ref 0.0–149.0)
VLDL: 23.4 mg/dL (ref 0.0–40.0)

## 2015-02-08 NOTE — Assessment & Plan Note (Signed)
Reviewed age and gender appropriate health maintenance issues (prudent diet, regular exercise, health risks of tobacco and excessive alcohol, use of seatbelts, fire alarms in home, use of sunscreen).  Also reviewed age and gender appropriate health screening as well as vaccine recommendations. CBC, FLP, and CMET today (fasting). Hx of prostate ca; pt is s/p tx with seed implants and ext beam radiation : DRE normal, PSA level drawn and will fax result to his urologist, Dr. Junious Silk.

## 2015-02-08 NOTE — Progress Notes (Signed)
Office Note 02/08/2015  CC:  Chief Complaint  Patient presents with  . Annual Exam    HPI:  [name] is a 69 y.o. White male who is here for health maintenance exam. Most recent urology f/u was good but I need to do his surveillance DRE and PSA today.  He is active/not sedentary, works in yard but no formal CV exercise regimen. Diet: trying to limit simple carbs, increase veggies.  Eye exam < 2 yrs ago.   Dental: q6 mo preventative visits.  Every 8 wks he donates blood, says bp usually 130s/70-80.   Most recent donation was 1 week ago: whole blood.   Past Medical History  Diagnosis Date  . 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  As of urol f/u 08/2014, the plan is for ME to repeat PSA at f/u here 10/2014  . Hx of adenomatous colonic polyps     polypectomy 2007; 2012; 2015 (+adenomatous, no high grade dysplasia); recall 5 yrs  . Foot drop, left     s/p hip fracture 1969  . Chronic venous insufficiency     +compression hose  . Osteoarthritis     knees and hips primarily  . Toe infection 05/27/2013    Left great toe and right 2nd toe  . Anterior dislocation of right shoulder 09/2013    Reduced in ED under sedation.  Dislocated posteriorly 2015, ? partial RC tear, has f/u planned with Dr. Marlou Sa--? surgery? possible plan for 2016.  [name] Chronic renal insufficiency, stage II (mild)     CrCl in the 60s  . Urge incontinence     vesicare helpful    Past Surgical History  Procedure Laterality Date  . Seed implant prostate  2012  . Total hip arthroplasty  2008    left  . Rotator cuff repair  2006    right  . Bilateral carpal tunnel release  2003  . Arthroscopy left shoulder    . Colonoscopy  2007;2012;2015    04/12/2014 Tubular adenoma x 1; per Dr. Henrene Pastor recall 5 yrs  . Vasectomy      Family History  Problem Relation Age of Onset  . Lung cancer Mother   . Heart disease Father      History   Social History  . Marital Status: Married    Spouse Name: N/A  . Number of Children: N/A  . Years of Education: N/A   Occupational History  . Not on file.   Social History Main Topics  . Smoking status: Never Smoker   . Smokeless tobacco: Never Used  . Alcohol Use: No  . Drug Use: No  . Sexual Activity: Not on file   Other Topics Concern  . Not on file   Social History Narrative   Married, 2 biologic children, 2 adopted, 5 GC.   Lives in [place].  Worked 35 yrs at [employer].   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   No formal exercise.  No T/A/Ds.   Enjoys woodworking.   MEDS: no longer takes fish oil due to taste/GI upset Outpatient Prescriptions Prior to Visit  Medication Sig Dispense Refill  . aspirin 81 MG tablet Take 81 mg by mouth daily.      . solifenacin (VESICARE) 5 MG tablet Take 5 mg by mouth daily.    . Omega-3 Fatty Acids (FISH OIL) 500 MG CAPS Take by mouth. 1 tablet  daily      No facility-administered medications prior to visit.    No Known Allergies  ROS Review of Systems  Constitutional: Negative for fever, chills, appetite change and fatigue.  HENT: Negative for congestion, dental problem, ear pain and sore throat.   Eyes: Negative for discharge, redness and visual disturbance.  Respiratory: Negative for cough, chest tightness, shortness of breath and wheezing.   Cardiovascular: Negative for chest pain, palpitations and leg swelling.  Gastrointestinal: Negative for nausea, vomiting, abdominal pain, diarrhea and blood in stool.  Genitourinary: Negative for dysuria, urgency, frequency, hematuria, flank pain and difficulty urinating.  Musculoskeletal: Negative for myalgias, back pain, joint swelling, arthralgias and neck stiffness.  Skin: Negative for pallor and rash.  Neurological: Negative for dizziness, speech difficulty, weakness and headaches.  Hematological: Negative for adenopathy. Does not bruise/bleed  easily.  Psychiatric/Behavioral: Negative for confusion and sleep disturbance. The patient is not nervous/anxious.     PE; Blood pressure 150/76, pulse 53, temperature 97.7 F (36.5 C), temperature source Temporal, resp. rate 16, height 6' 1.5" (1.867 m), weight 255 lb (115.667 kg), SpO2 100 %. repeat BP with manual cuff 150/76. Gen: Alert, well appearing.  Patient is oriented to person, place, time, and situation. AFFECT: pleasant, lucid thought and speech. ENT: Ears: EACs clear, normal epithelium.  TMs with good light reflex and landmarks bilaterally.  Eyes: no injection, icteris, swelling, or exudate.  EOMI, PERRLA. Nose: no drainage or turbinate edema/swelling.  No injection or focal lesion.  Mouth: lips without lesion/swelling.  Oral mucosa pink and moist.  Dentition intact and without obvious caries or gingival swelling.  Oropharynx without erythema, exudate, or swelling.  Neck: supple/nontender.  No LAD, mass, or TM.  Carotid pulses 2+ bilaterally, without bruits. CV: RRR, no m/r/g.   LUNGS: CTA bilat, nonlabored resps, good aeration in all lung fields. ABD: soft, NT, ND, BS normal.  No hepatospenomegaly or mass.  No bruits. EXT: no clubbing, cyanosis, or edema.  Musculoskeletal: no joint swelling, erythema, warmth, or tenderness.  ROM of all joints intact. Skin - no sores or suspicious lesions or rashes or color changes Rectal exam: negative without mass, lesions or tenderness, PROSTATE EXAM: smooth and symmetric without nodules or tenderness.  Pertinent labs:  Lab Results  Component Value Date   TSH 0.77 11/25/2012   Lab Results  Component Value Date   WBC 3.4* 05/27/2013   HGB 15.0 05/27/2013   HCT 43.6 05/27/2013   MCV 89.5 05/27/2013   PLT 200 05/27/2013   Lab Results  Component Value Date   CREATININE 1.2 12/10/2013   BUN 19 12/10/2013   NA 138 12/10/2013   K 5.0 12/10/2013   CL 106 12/10/2013   CO2 26 12/10/2013   Lab Results  Component Value Date   ALT 30  11/25/2012   AST 27 11/25/2012   ALKPHOS 76 11/25/2012   BILITOT 0.7 11/25/2012   Lab Results  Component Value Date   CHOL 175 12/10/2013   Lab Results  Component Value Date   HDL 36.10* 12/10/2013   Lab Results  Component Value Date   LDLCALC 115* 12/10/2013   Lab Results  Component Value Date   TRIG 119.0 12/10/2013   Lab Results  Component Value Date   CHOLHDL 5 12/10/2013   Lab Results  Component Value Date   PSA 15.60* 05/21/2010    ASSESSMENT AND PLAN:   Health maintenance examination Reviewed age and gender appropriate health maintenance issues (prudent diet, regular exercise, health risks of tobacco  and excessive alcohol, use of seatbelts, fire alarms in home, use of sunscreen).  Also reviewed age and gender appropriate health screening as well as vaccine recommendations. CBC, FLP, and CMET today (fasting). Hx of prostate ca; pt is s/p tx with seed implants and ext beam radiation : DRE normal, PSA level drawn and will fax result to his urologist, Dr. Junious Silk.   An After Visit Summary was printed and given to the patient.  FOLLOW UP:  Return in about 1 year (around 02/08/2016) for annual CPE (fasting).

## 2015-02-08 NOTE — Progress Notes (Signed)
Pre visit review using our clinic review tool, if applicable. No additional management support is needed unless otherwise documented below in the visit note. 

## 2015-02-14 ENCOUNTER — Encounter: Payer: Self-pay | Admitting: Family Medicine

## 2015-02-15 ENCOUNTER — Other Ambulatory Visit: Payer: Self-pay | Admitting: Family Medicine

## 2015-02-15 DIAGNOSIS — Z0000 Encounter for general adult medical examination without abnormal findings: Secondary | ICD-10-CM

## 2015-02-17 LAB — COMPREHENSIVE METABOLIC PANEL
ALBUMIN: 3.6 g/dL (ref 3.5–5.2)
ALK PHOS: 81 U/L (ref 39–117)
ALT: 16 U/L (ref 0–53)
AST: 18 U/L (ref 0–37)
BUN: 17 mg/dL (ref 6–23)
CALCIUM: 9.9 mg/dL (ref 8.4–10.5)
CO2: 29 meq/L (ref 19–32)
CREATININE: 1.11 mg/dL (ref 0.40–1.50)
Chloride: 105 mEq/L (ref 96–112)
GFR: 69.59 mL/min (ref >60.00)
Glucose, Bld: 100 mg/dL — ABNORMAL HIGH (ref 70–99)
Potassium: 4.7 mEq/L (ref 3.5–5.1)
SODIUM: 136 meq/L (ref 135–145)
Total Bilirubin: 0.6 mg/dL (ref 0.2–1.2)
Total Protein: 6.5 g/dL (ref 6.0–8.3)

## 2015-02-17 NOTE — Addendum Note (Signed)
Addended by: Ralph Dowdy on: 02/17/2015 08:04 AM   Modules accepted: Orders

## 2015-09-04 ENCOUNTER — Encounter: Payer: Self-pay | Admitting: Family Medicine

## 2015-10-08 DIAGNOSIS — M5136 Other intervertebral disc degeneration, lumbar region: Secondary | ICD-10-CM

## 2015-10-08 DIAGNOSIS — M51369 Other intervertebral disc degeneration, lumbar region without mention of lumbar back pain or lower extremity pain: Secondary | ICD-10-CM

## 2015-10-08 HISTORY — DX: Other intervertebral disc degeneration, lumbar region without mention of lumbar back pain or lower extremity pain: M51.369

## 2015-10-08 HISTORY — DX: Other intervertebral disc degeneration, lumbar region: M51.36

## 2015-11-20 DIAGNOSIS — Z96642 Presence of left artificial hip joint: Secondary | ICD-10-CM | POA: Diagnosis not present

## 2015-11-20 DIAGNOSIS — M25552 Pain in left hip: Secondary | ICD-10-CM | POA: Diagnosis not present

## 2015-11-26 ENCOUNTER — Encounter: Payer: Self-pay | Admitting: Family Medicine

## 2015-11-27 ENCOUNTER — Other Ambulatory Visit: Payer: Self-pay | Admitting: Orthopedic Surgery

## 2015-11-27 DIAGNOSIS — M25552 Pain in left hip: Secondary | ICD-10-CM

## 2015-11-27 DIAGNOSIS — M79605 Pain in left leg: Secondary | ICD-10-CM

## 2015-12-03 ENCOUNTER — Ambulatory Visit
Admission: RE | Admit: 2015-12-03 | Discharge: 2015-12-03 | Disposition: A | Discharge status: Home / Self Care | Payer: PPO | Source: Ambulatory Visit | Attending: Orthopedic Surgery | Admitting: Orthopedic Surgery

## 2015-12-03 DIAGNOSIS — Z471 Aftercare following joint replacement surgery: Secondary | ICD-10-CM | POA: Diagnosis not present

## 2015-12-03 DIAGNOSIS — M79605 Pain in left leg: Secondary | ICD-10-CM

## 2015-12-03 DIAGNOSIS — M25552 Pain in left hip: Secondary | ICD-10-CM

## 2015-12-03 DIAGNOSIS — Z96642 Presence of left artificial hip joint: Secondary | ICD-10-CM | POA: Diagnosis not present

## 2015-12-03 DIAGNOSIS — M5126 Other intervertebral disc displacement, lumbar region: Secondary | ICD-10-CM | POA: Diagnosis not present

## 2015-12-04 DIAGNOSIS — M25552 Pain in left hip: Secondary | ICD-10-CM | POA: Diagnosis not present

## 2015-12-11 DIAGNOSIS — M5116 Intervertebral disc disorders with radiculopathy, lumbar region: Secondary | ICD-10-CM | POA: Diagnosis not present

## 2015-12-27 DIAGNOSIS — M25552 Pain in left hip: Secondary | ICD-10-CM | POA: Diagnosis not present

## 2015-12-27 DIAGNOSIS — M5116 Intervertebral disc disorders with radiculopathy, lumbar region: Secondary | ICD-10-CM | POA: Diagnosis not present

## 2015-12-27 DIAGNOSIS — Z96642 Presence of left artificial hip joint: Secondary | ICD-10-CM | POA: Diagnosis not present

## 2015-12-27 DIAGNOSIS — M545 Low back pain: Secondary | ICD-10-CM | POA: Diagnosis not present

## 2016-01-22 DIAGNOSIS — M21372 Foot drop, left foot: Secondary | ICD-10-CM | POA: Diagnosis not present

## 2016-02-09 ENCOUNTER — Encounter: Payer: Self-pay | Admitting: Family Medicine

## 2016-02-09 ENCOUNTER — Ambulatory Visit (INDEPENDENT_AMBULATORY_CARE_PROVIDER_SITE_OTHER): Payer: PPO | Admitting: Family Medicine

## 2016-02-09 VITALS — BP 135/84 | HR 65 | Temp 98.0°F | Ht 75.0 in | Wt 239.8 lb

## 2016-02-09 DIAGNOSIS — Z8546 Personal history of malignant neoplasm of prostate: Secondary | ICD-10-CM

## 2016-02-09 DIAGNOSIS — Z0000 Encounter for general adult medical examination without abnormal findings: Secondary | ICD-10-CM | POA: Diagnosis not present

## 2016-02-09 LAB — COMPREHENSIVE METABOLIC PANEL
ALBUMIN: 3.7 g/dL (ref 3.5–5.2)
ALK PHOS: 86 U/L (ref 39–117)
ALT: 14 U/L (ref 0–53)
AST: 12 U/L (ref 0–37)
BILIRUBIN TOTAL: 0.5 mg/dL (ref 0.2–1.2)
BUN: 17 mg/dL (ref 6–23)
CALCIUM: 10.3 mg/dL (ref 8.4–10.5)
CO2: 25 meq/L (ref 19–32)
Chloride: 103 mEq/L (ref 96–112)
Creatinine, Ser: 0.97 mg/dL (ref 0.40–1.50)
GFR: 81.08 mL/min (ref >60.00)
GLUCOSE: 96 mg/dL (ref 70–99)
Potassium: 4.7 mEq/L (ref 3.5–5.1)
Sodium: 137 mEq/L (ref 135–145)
TOTAL PROTEIN: 7.5 g/dL (ref 6.0–8.3)

## 2016-02-09 LAB — LIPID PANEL
CHOLESTEROL: 159 mg/dL (ref 0–200)
HDL: 36.8 mg/dL — ABNORMAL LOW (ref >39.00)
LDL CALC: 109 mg/dL — AB (ref 0–99)
NonHDL: 122.25
TRIGLYCERIDES: 68 mg/dL (ref 0.0–149.0)
Total CHOL/HDL Ratio: 4
VLDL: 13.6 mg/dL (ref 0.0–40.0)

## 2016-02-09 LAB — TSH: TSH: 1.19 u[IU]/mL (ref 0.35–4.50)

## 2016-02-09 LAB — CBC WITH DIFFERENTIAL/PLATELET
BASOS ABS: 0.2 10*3/uL — AB (ref 0.0–0.1)
Basophils Relative: 2.3 % (ref 0.0–3.0)
EOS ABS: 0.2 10*3/uL (ref 0.0–0.7)
Eosinophils Relative: 2.3 % (ref 0.0–5.0)
HEMATOCRIT: 41.7 % (ref 39.0–52.0)
HEMOGLOBIN: 13.7 g/dL (ref 13.0–17.0)
LYMPHS PCT: 21.4 % (ref 12.0–46.0)
Lymphs Abs: 1.5 10*3/uL (ref 0.7–4.0)
MCHC: 32.8 g/dL (ref 30.0–36.0)
MCV: 83.8 fl (ref 78.0–100.0)
MONO ABS: 0.4 10*3/uL (ref 0.1–1.0)
Monocytes Relative: 6.5 % (ref 3.0–12.0)
Neutro Abs: 4.6 10*3/uL (ref 1.4–7.7)
Neutrophils Relative %: 67.5 % (ref 43.0–77.0)
PLATELETS: 418 10*3/uL — AB (ref 150.0–400.0)
RBC: 4.97 Mil/uL (ref 4.22–5.81)
RDW: 17 % — ABNORMAL HIGH (ref 11.5–15.5)
WBC: 6.8 10*3/uL (ref 4.0–10.5)

## 2016-02-09 LAB — PSA: PSA: 0.2 ng/mL (ref 0.10–4.00)

## 2016-02-09 NOTE — Addendum Note (Signed)
Addended by: Milta Deiters on: 02/09/2016 09:04 AM   Modules accepted: Miquel Dunn

## 2016-02-09 NOTE — Progress Notes (Signed)
Pre visit review using our clinic review tool, if applicable. No additional management support is needed unless otherwise documented below in the visit note. 

## 2016-02-09 NOTE — Progress Notes (Signed)
Office Note 02/09/2016  CC:  Chief Complaint  Patient presents with  . Annual Exam    HPI:  [name] is a 70 y.o. White male who is here for annual health maintenance exam. We'll do prostate ca surveillance: DRE + PSA.  He does not f/u with Dr. Junious Silk until 08/2016.  Feeling good except chronic LBP lately, getting attention from orthopedist, Dr. Lorin Mercy.  May have to have surgery. He is also getting a second opinion with a neurosurgeon.  Has had some wt loss due to lack of appetite and food tasting bad from taking oxycodone for his back pain.    Past Medical History  Diagnosis Date  . 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  As of urol f/u 08/2014, plan is for me to follow PSAs and fax results to Dr. Junious Silk: PSA 02/2015 was 0.21 (stable).  Stable on f/u with Dr. Junious Silk 08/2015.  [name] Hx of adenomatous colonic polyps     polypectomy 2007; 2012; 2015 (+adenomatous, no high grade dysplasia); recall 5 yrs  . Foot drop, left     s/p hip fracture 1969  . Chronic venous insufficiency     +compression hose  . Osteoarthritis     knees and hips primarily  . Toe infection 05/27/2013    Left great toe and right 2nd toe  . Anterior dislocation of right shoulder 09/2013    Reduced in ED under sedation.  Dislocated posteriorly 2015, ? partial RC tear, has f/u planned with Dr. Marlou Sa--? surgery? possible plan for 2016.  [name] Chronic renal insufficiency, stage II (mild)     CrCl in the 60s  . Urge incontinence     vesicare helpful  . White coat hypertension   . Blood donor, whole blood     Has donated approx 30 gallons in his lifetime  . DDD (degenerative disc disease), lumbar 2017    Dr. Noe Gens ortho.  He is now wearing a new leg brace.  Has had ESI and will get another.    Past Surgical History  Procedure Laterality Date  . Seed implant prostate  2012  . Total hip arthroplasty  2008    left  ([facility])  . Rotator cuff repair  2006    right  . Bilateral carpal tunnel release  2003  . Arthroscopy left shoulder    . Colonoscopy  2007;2012;2015    04/12/2014 Tubular adenoma x 1; per Dr. Henrene Pastor recall 5 yrs  . Vasectomy      Family History  Problem Relation Age of Onset  . Lung cancer Mother   . Heart disease Father     Social History   Social History  . Marital Status: Married    Spouse Name: N/A  . Number of Children: N/A  . Years of Education: N/A   Occupational History  . Not on file.   Social History Main Topics  . Smoking status: Never Smoker   . Smokeless tobacco: Never Used  . Alcohol Use: No  . Drug Use: No  . Sexual Activity: Not on file   Other Topics Concern  . Not on file   Social History Narrative   Married, 2 biologic children, 2 adopted, 5 GC.   Lives in [place].  Worked 35 yrs at [employer].   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   No formal exercise.  No T/A/Ds.   Enjoys  woodworking.    Outpatient Prescriptions Prior to Visit  Medication Sig Dispense Refill  . aspirin 81 MG tablet Take 81 mg by mouth daily.      . solifenacin (VESICARE) 5 MG tablet Take 5 mg by mouth daily.    . Omega-3 Fatty Acids (FISH OIL) 500 MG CAPS Take by mouth. 1 tablet daily      No facility-administered medications prior to visit.    No Known Allergies  ROS Review of Systems  Constitutional: Negative for fever, chills, appetite change and fatigue.  HENT: Negative for congestion, dental problem, ear pain and sore throat.   Eyes: Negative for discharge, redness and visual disturbance.  Respiratory: Negative for cough, chest tightness, shortness of breath and wheezing.   Cardiovascular: Negative for chest pain, palpitations and leg swelling.  Gastrointestinal: Negative for nausea, vomiting, abdominal pain, diarrhea and blood in stool.  Genitourinary: Negative for dysuria, urgency, frequency, hematuria, flank pain and difficulty  urinating.  Musculoskeletal: Negative for myalgias, back pain, joint swelling, arthralgias and neck stiffness.  Skin: Negative for pallor and rash.  Neurological: Negative for dizziness, speech difficulty, weakness and headaches.  Hematological: Negative for adenopathy. Does not bruise/bleed easily.  Psychiatric/Behavioral: Negative for confusion and sleep disturbance. The patient is not nervous/anxious.     PE; Blood pressure 135/84, pulse 65, temperature 98 F (36.7 C), temperature source Oral, height 6\' 3"  (1.905 m), weight 239 lb 12.8 oz (108.773 kg), SpO2 98 %. Gen: Alert, well appearing.  Patient is oriented to person, place, time, and situation. AFFECT: pleasant, lucid thought and speech. ENT: Ears: EACs clear, normal epithelium.  TMs with good light reflex and landmarks bilaterally.  Eyes: no injection, icteris, swelling, or exudate.  EOMI, PERRLA. Nose: no drainage or turbinate edema/swelling.  No injection or focal lesion.  Mouth: lips without lesion/swelling.  Oral mucosa pink and moist.  Dentition intact and without obvious caries or gingival swelling.  Oropharynx without erythema, exudate, or swelling.  Neck: supple/nontender.  No LAD, mass, or TM.  Carotid pulses 2+ bilaterally, without bruits. CV: RRR, no m/r/g.   LUNGS: CTA bilat, nonlabored resps, good aeration in all lung fields. ABD: soft, NT, ND, BS normal.  No hepatospenomegaly or mass.  No bruits. EXT: no clubbing, cyanosis, or edema.  Musculoskeletal: no joint swelling, erythema, warmth, or tenderness.  ROM of all joints intact. Skin - no sores or suspicious lesions or rashes or color changes Rectal exam: negative without mass, lesions or tenderness, PROSTATE EXAM: smooth and symmetric without nodules or tenderness.  Pertinent labs:  Lab Results  Component Value Date   TSH 0.77 11/25/2012   Lab Results  Component Value Date   WBC 5.3 02/08/2015   HGB 16.0 02/08/2015   HCT 47.8 02/08/2015   MCV 89.3 02/08/2015    PLT 210.0 02/08/2015   Lab Results  Component Value Date   CREATININE 1.11 02/17/2015   BUN 17 02/17/2015   NA 136 02/17/2015   K 4.7 02/17/2015   CL 105 02/17/2015   CO2 29 02/17/2015   Lab Results  Component Value Date   ALT 16 02/17/2015   AST 18 02/17/2015   ALKPHOS 81 02/17/2015   BILITOT 0.6 02/17/2015   Lab Results  Component Value Date   CHOL 177 02/08/2015   Lab Results  Component Value Date   HDL 35.80* 02/08/2015   Lab Results  Component Value Date   LDLCALC 118* 02/08/2015   Lab Results  Component Value Date   TRIG 117.0 02/08/2015  Lab Results  Component Value Date   CHOLHDL 5 02/08/2015   Lab Results  Component Value Date   PSA 0.21 02/08/2015   PSA 15.60* 05/21/2010   Lab Results  Component Value Date   HGBA1C 5.4 05/27/2013    ASSESSMENT AND PLAN:   Health maintenance exam: Reviewed age and gender appropriate health maintenance issues (prudent diet, regular exercise, health risks of tobacco and excessive alcohol, use of seatbelts, fire alarms in home, use of sunscreen).  Also reviewed age and gender appropriate health screening as well as vaccine recommendations. Vacc's UTD. Colon cancer screening: next colonoscopy due 04/2019. History of prostate cancer: he is s/p seed implants and external beam radiation.  DRE normal today, PSA drawn and will fax result to Dr. Junious Silk at [facility] urology.   Fasting HP labs done today.  An After Visit Summary was printed and given to the patient.  FOLLOW UP:  Return in about 1 year (around 02/08/2017) for annual CPE (fasting).  Signed:  Crissie Sickles, MD           02/09/2016

## 2016-02-11 ENCOUNTER — Encounter: Payer: Self-pay | Admitting: Family Medicine

## 2016-02-11 DIAGNOSIS — R718 Other abnormality of red blood cells: Secondary | ICD-10-CM | POA: Insufficient documentation

## 2016-02-12 ENCOUNTER — Telehealth: Payer: Self-pay

## 2016-02-12 NOTE — Telephone Encounter (Deleted)
-----   Message from Tammi Sou, MD sent at 02/11/2016  6:56 PM EDT ----- Can a ferritin be added on to these labs?  Dx is R71.8. -thx

## 2016-02-12 NOTE — Telephone Encounter (Signed)
Faxed request to [facility].

## 2016-02-12 NOTE — Telephone Encounter (Signed)
-----   Message from Tammi Sou, MD sent at 02/11/2016  6:56 PM EDT ----- Can a ferritin be added on to these labs?  Dx is R71.8. -thx

## 2016-02-12 NOTE — Telephone Encounter (Signed)
This encounter was created in error - please disregard.

## 2016-02-13 ENCOUNTER — Telehealth: Payer: Self-pay

## 2016-02-13 DIAGNOSIS — M5416 Radiculopathy, lumbar region: Secondary | ICD-10-CM | POA: Diagnosis not present

## 2016-02-13 DIAGNOSIS — M5116 Intervertebral disc disorders with radiculopathy, lumbar region: Secondary | ICD-10-CM | POA: Diagnosis not present

## 2016-02-13 NOTE — Telephone Encounter (Signed)
-----   Message from Tammi Sou, MD sent at 02/12/2016  4:50 PM EDT ----- All labs normal. Ask patient if he donates blood frequently.-thx

## 2016-02-13 NOTE — Telephone Encounter (Signed)
OK, thanks. Tell pt I suspected this b/c one of his red blood cell measurements was abnormal, but ALL the others were normal. Reassure him that nothing is wrong.  However, as long as he continues to donate blood frequently it would be a good idea to take one OTC iron tab a day (ferrous sulfate 325mg , 1 tab daily).  If he stops regularly donating blood then he can stop the iron.-thx

## 2016-02-13 NOTE — Telephone Encounter (Signed)
Spoke to patient. Gave lab results. Patient answered "yes" he does donate blood frequently.

## 2016-02-14 NOTE — Telephone Encounter (Signed)
Spoke to spouse. Gave instructions. Spouse verbalized understanding. She states the patient is very dedicated to donating blood so they will pick up an OTC iron tab.

## 2016-02-26 DIAGNOSIS — R030 Elevated blood-pressure reading, without diagnosis of hypertension: Secondary | ICD-10-CM | POA: Diagnosis not present

## 2016-02-26 DIAGNOSIS — M5416 Radiculopathy, lumbar region: Secondary | ICD-10-CM | POA: Diagnosis not present

## 2016-02-26 DIAGNOSIS — Z6830 Body mass index (BMI) 30.0-30.9, adult: Secondary | ICD-10-CM | POA: Diagnosis not present

## 2016-02-26 DIAGNOSIS — M21372 Foot drop, left foot: Secondary | ICD-10-CM | POA: Diagnosis not present

## 2016-02-29 ENCOUNTER — Other Ambulatory Visit: Payer: Self-pay | Admitting: Neurosurgery

## 2016-03-19 NOTE — Pre-Procedure Instructions (Addendum)
WOLFE RENTZ  03/19/2016      [facility] - [place],  - [address] [place] [address] Phone: [phone] Fax: [phone]  [id number] - [place], [place] - [address] [place] [address] [place] [address] Phone: [phone] Fax: [phone]    Your procedure is scheduled on Thursday, June 22nd, 2017.   Report to [facility] Admitting at 7:00 A.M.   Call this number if you have problems the morning of surgery:  [phone]   Remember:  Do not eat food or drink liquids after midnight.   Take these medicines the morning of surgery with A SIP OF WATER: Solifenacin (Vesicare). Tylenol if needed  5 days prior to surgery, stop taking: Aspirin, NSAIDS, Naproxen, Aleve, BC's, Goody's, Fish oil, all herbal medications, and all vitamins.    Do not wear jewelry.  Do not wear lotions, powders, or colognes.  You may NOT wear deodorant.  Men may shave face and neck.  Do not bring valuables to the hospital.   [facility] is not responsible for any belongings or valuables.  Contacts, dentures or bridgework may not be worn into surgery.  Leave your suitcase in the car.  After surgery it may be brought to your room.  For patients admitted to the hospital, discharge time will be determined by your treatment team.  Patients discharged the day of surgery will not be allowed to drive home.   Special instructions:  See attached.   Please read over the following fact sheets that you were given. MRSA Information     [facility]- Preparing For Surgery  Before surgery, you can play an important role. Because skin is not sterile, your skin needs to be as free of germs as possible. You can reduce the number of germs on your skin by washing with CHG (chlorahexidine gluconate) Soap before surgery.  CHG is an antiseptic cleaner which kills germs and bonds with the skin to continue killing germs even after  washing.  Please do not use if you have an allergy to CHG or antibacterial soaps. If your skin becomes reddened/irritated stop using the CHG.  Do not shave (including legs and underarms) for at least 48 hours prior to first CHG shower. It is OK to shave your face.  Please follow these instructions carefully.   1. Shower the NIGHT BEFORE SURGERY and the MORNING OF SURGERY with CHG.   2. If you chose to wash your hair, wash your hair first as usual with your normal shampoo.  3. After you shampoo, rinse your hair and body thoroughly to remove the shampoo.  4. Use CHG as you would any other liquid soap. You can apply CHG directly to the skin and wash gently with a scrungie or a clean washcloth.   5. Apply the CHG Soap to your body ONLY FROM THE NECK DOWN.  Do not use on open wounds or open sores. Avoid contact with your eyes, ears, mouth and genitals (private parts). Wash genitals (private parts) with your normal soap.  6. Wash thoroughly, paying special attention to the area where your surgery will be performed.  7. Thoroughly rinse your body with warm water from the neck down.  8. DO NOT shower/wash with your normal soap after using and rinsing off the CHG Soap.  9. Pat yourself dry with a CLEAN TOWEL.   10. Wear CLEAN PAJAMAS   11. Place CLEAN SHEETS on your bed the night of your  first shower and DO NOT SLEEP WITH PETS.    Day of Surgery: Do not apply any deodorants/lotions. Please wear clean clothes to the hospital/surgery center.

## 2016-03-20 ENCOUNTER — Encounter (HOSPITAL_COMMUNITY)
Admission: RE | Admit: 2016-03-20 | Discharge: 2016-03-20 | Disposition: A | Discharge status: Home / Self Care | Payer: PPO | Source: Ambulatory Visit | Attending: Neurosurgery | Admitting: Neurosurgery

## 2016-03-20 ENCOUNTER — Encounter (HOSPITAL_COMMUNITY): Payer: Self-pay

## 2016-03-20 DIAGNOSIS — M4806 Spinal stenosis, lumbar region: Secondary | ICD-10-CM | POA: Insufficient documentation

## 2016-03-20 DIAGNOSIS — Z01812 Encounter for preprocedural laboratory examination: Secondary | ICD-10-CM | POA: Diagnosis not present

## 2016-03-20 LAB — CBC
HCT: 42.8 % (ref 39.0–52.0)
HEMOGLOBIN: 13.1 g/dL (ref 13.0–17.0)
MCH: 26.6 pg (ref 26.0–34.0)
MCHC: 30.6 g/dL (ref 30.0–36.0)
MCV: 87 fL (ref 78.0–100.0)
Platelets: 303 10*3/uL (ref 150–400)
RBC: 4.92 MIL/uL (ref 4.22–5.81)
RDW: 15.1 % (ref 11.5–15.5)
WBC: 6.7 10*3/uL (ref 4.0–10.5)

## 2016-03-20 LAB — BASIC METABOLIC PANEL
ANION GAP: 5 (ref 5–15)
BUN: 16 mg/dL (ref 6–20)
CALCIUM: 9.8 mg/dL (ref 8.9–10.3)
CO2: 27 mmol/L (ref 22–32)
Chloride: 106 mmol/L (ref 101–111)
Creatinine, Ser: 1.2 mg/dL (ref 0.61–1.24)
GFR calc Af Amer: >60 mL/min (ref >60)
GFR calc non Af Amer: 59 mL/min — ABNORMAL LOW (ref >60)
GLUCOSE: 107 mg/dL — AB (ref 65–99)
Potassium: 4.6 mmol/L (ref 3.5–5.1)
Sodium: 138 mmol/L (ref 135–145)

## 2016-03-20 LAB — SURGICAL PCR SCREEN
MRSA, PCR: NEGATIVE
Staphylococcus aureus: POSITIVE — AB

## 2016-03-27 MED ORDER — CEFAZOLIN SODIUM-DEXTROSE 2-4 GM/100ML-% IV SOLN
2.0000 g | INTRAVENOUS | Status: AC
  Administered 2016-03-28: 2 g via INTRAVENOUS
  Filled 2016-03-27: qty 100

## 2016-03-28 ENCOUNTER — Ambulatory Visit (HOSPITAL_COMMUNITY): Payer: PPO | Admitting: Anesthesiology

## 2016-03-28 ENCOUNTER — Encounter (HOSPITAL_COMMUNITY)
Admission: AD | Disposition: A | Discharge status: Home / Self Care | Payer: Self-pay | Source: Ambulatory Visit | Attending: Neurosurgery

## 2016-03-28 ENCOUNTER — Ambulatory Visit (HOSPITAL_COMMUNITY): Payer: PPO

## 2016-03-28 ENCOUNTER — Encounter (HOSPITAL_COMMUNITY): Payer: Self-pay | Admitting: Anesthesiology

## 2016-03-28 ENCOUNTER — Ambulatory Visit (HOSPITAL_COMMUNITY)
Admission: AD | Admit: 2016-03-28 | Discharge: 2016-03-28 | Disposition: A | Discharge status: Home / Self Care | Payer: PPO | Source: Ambulatory Visit | Attending: Neurosurgery | Admitting: Neurosurgery

## 2016-03-28 DIAGNOSIS — M4806 Spinal stenosis, lumbar region: Principal | ICD-10-CM | POA: Insufficient documentation

## 2016-03-28 DIAGNOSIS — M21372 Foot drop, left foot: Secondary | ICD-10-CM | POA: Diagnosis not present

## 2016-03-28 DIAGNOSIS — Z8546 Personal history of malignant neoplasm of prostate: Secondary | ICD-10-CM | POA: Insufficient documentation

## 2016-03-28 DIAGNOSIS — I10 Essential (primary) hypertension: Secondary | ICD-10-CM | POA: Diagnosis not present

## 2016-03-28 DIAGNOSIS — Z419 Encounter for procedure for purposes other than remedying health state, unspecified: Secondary | ICD-10-CM

## 2016-03-28 DIAGNOSIS — M5136 Other intervertebral disc degeneration, lumbar region: Secondary | ICD-10-CM | POA: Diagnosis not present

## 2016-03-28 DIAGNOSIS — M48062 Spinal stenosis, lumbar region with neurogenic claudication: Secondary | ICD-10-CM | POA: Diagnosis present

## 2016-03-28 HISTORY — PX: LUMBAR LAMINECTOMY/DECOMPRESSION MICRODISCECTOMY: SHX5026

## 2016-03-28 SURGERY — LUMBAR LAMINECTOMY/DECOMPRESSION MICRODISCECTOMY 1 LEVEL
Anesthesia: General | Site: Spine Lumbar | Laterality: Left

## 2016-03-28 MED ORDER — FENTANYL CITRATE (PF) 100 MCG/2ML IJ SOLN
INTRAMUSCULAR | Status: DC | PRN
  Administered 2016-03-28: 150 ug via INTRAVENOUS

## 2016-03-28 MED ORDER — FAMOTIDINE IN NACL 20-0.9 MG/50ML-% IV SOLN
20.0000 mg | Freq: Two times a day (BID) | INTRAVENOUS | Status: DC
  Filled 2016-03-28 (×2): qty 50

## 2016-03-28 MED ORDER — PROPOFOL 10 MG/ML IV BOLUS
INTRAVENOUS | Status: AC
  Filled 2016-03-28: qty 20

## 2016-03-28 MED ORDER — HYDROMORPHONE HCL 1 MG/ML IJ SOLN: 0.5000 mg | INTRAMUSCULAR | Status: DC | PRN

## 2016-03-28 MED ORDER — CEFAZOLIN IN D5W 1 GM/50ML IV SOLN: 1.0000 g | Freq: Three times a day (TID) | INTRAVENOUS | Status: DC

## 2016-03-28 MED ORDER — OXYCODONE-ACETAMINOPHEN 5-325 MG PO TABS: 1.0000 | ORAL_TABLET | ORAL | Status: DC | PRN

## 2016-03-28 MED ORDER — METHOCARBAMOL 500 MG PO TABS
500.0000 mg | ORAL_TABLET | Freq: Four times a day (QID) | ORAL | Status: DC | PRN
Start: 2016-03-28 — End: 2016-03-28
  Administered 2016-03-28: 500 mg via ORAL

## 2016-03-28 MED ORDER — FENTANYL CITRATE (PF) 100 MCG/2ML IJ SOLN
INTRAMUSCULAR | Status: AC
  Filled 2016-03-28: qty 2

## 2016-03-28 MED ORDER — ONDANSETRON HCL 4 MG/2ML IJ SOLN: 4.0000 mg | INTRAMUSCULAR | Status: DC | PRN

## 2016-03-28 MED ORDER — LIDOCAINE HCL (CARDIAC) 20 MG/ML IV SOLN
INTRAVENOUS | Status: DC | PRN
  Administered 2016-03-28: 100 mg via INTRAVENOUS

## 2016-03-28 MED ORDER — FENTANYL CITRATE (PF) 100 MCG/2ML IJ SOLN
INTRAMUSCULAR | Status: DC | PRN
  Administered 2016-03-28: 100 ug via INTRAVENOUS

## 2016-03-28 MED ORDER — THROMBIN 5000 UNITS EX SOLR
CUTANEOUS | Status: DC | PRN
  Administered 2016-03-28 (×2): 5000 [IU] via TOPICAL

## 2016-03-28 MED ORDER — ASPIRIN EC 81 MG PO TBEC: 81.0000 mg | DELAYED_RELEASE_TABLET | Freq: Every day | ORAL | Status: DC

## 2016-03-28 MED ORDER — MIDAZOLAM HCL 2 MG/2ML IJ SOLN
INTRAMUSCULAR | Status: AC
  Filled 2016-03-28: qty 2

## 2016-03-28 MED ORDER — ZOLPIDEM TARTRATE 5 MG PO TABS: 5.0000 mg | ORAL_TABLET | Freq: Every evening | ORAL | Status: DC | PRN

## 2016-03-28 MED ORDER — LACTATED RINGERS IV SOLN
INTRAVENOUS | Status: DC
  Administered 2016-03-28: 08:00:00 via INTRAVENOUS

## 2016-03-28 MED ORDER — ACETAMINOPHEN 650 MG RE SUPP: 650.0000 mg | RECTAL | Status: DC | PRN

## 2016-03-28 MED ORDER — ROCURONIUM BROMIDE 100 MG/10ML IV SOLN
INTRAVENOUS | Status: DC | PRN
  Administered 2016-03-28: 40 mg via INTRAVENOUS

## 2016-03-28 MED ORDER — ACETAMINOPHEN 10 MG/ML IV SOLN
INTRAVENOUS | Status: AC
  Administered 2016-03-28: 1000 mg via INTRAVENOUS
  Filled 2016-03-28: qty 100

## 2016-03-28 MED ORDER — HYDROCODONE-ACETAMINOPHEN 5-325 MG PO TABS: 1.0000 | ORAL_TABLET | ORAL | Status: DC | PRN

## 2016-03-28 MED ORDER — POLYETHYLENE GLYCOL 3350 17 G PO PACK: 17.0000 g | PACK | Freq: Every day | ORAL | Status: DC | PRN

## 2016-03-28 MED ORDER — KCL IN DEXTROSE-NACL 20-5-0.45 MEQ/L-%-% IV SOLN
INTRAVENOUS | Status: AC
  Filled 2016-03-28: qty 1000

## 2016-03-28 MED ORDER — SUGAMMADEX SODIUM 500 MG/5ML IV SOLN
INTRAVENOUS | Status: DC | PRN
  Administered 2016-03-28: 225.8 mg via INTRAVENOUS

## 2016-03-28 MED ORDER — FLEET ENEMA 7-19 GM/118ML RE ENEM: 1.0000 | ENEMA | Freq: Once | RECTAL | Status: DC | PRN

## 2016-03-28 MED ORDER — SODIUM CHLORIDE 0.9% FLUSH
3.0000 mL | INTRAVENOUS | Status: DC | PRN
Start: 2016-03-28 — End: 2016-03-28

## 2016-03-28 MED ORDER — ALUM & MAG HYDROXIDE-SIMETH 200-200-20 MG/5ML PO SUSP: 30.0000 mL | Freq: Four times a day (QID) | ORAL | Status: DC | PRN

## 2016-03-28 MED ORDER — FENTANYL CITRATE (PF) 100 MCG/2ML IJ SOLN
25.0000 ug | INTRAMUSCULAR | Status: DC | PRN
  Administered 2016-03-28: 50 ug via INTRAVENOUS
  Administered 2016-03-28 (×2): 25 ug via INTRAVENOUS

## 2016-03-28 MED ORDER — FENTANYL CITRATE (PF) 100 MCG/2ML IJ SOLN
INTRAMUSCULAR | Status: DC
Start: 2016-03-28 — End: 2016-03-28
  Filled 2016-03-28: qty 2

## 2016-03-28 MED ORDER — PROPOFOL 10 MG/ML IV BOLUS
INTRAVENOUS | Status: DC | PRN
  Administered 2016-03-28: 200 mg via INTRAVENOUS

## 2016-03-28 MED ORDER — SODIUM CHLORIDE 0.9% FLUSH: 3.0000 mL | Freq: Two times a day (BID) | INTRAVENOUS | Status: DC

## 2016-03-28 MED ORDER — DOCUSATE SODIUM 100 MG PO CAPS: 100.0000 mg | ORAL_CAPSULE | Freq: Two times a day (BID) | ORAL | Status: DC

## 2016-03-28 MED ORDER — LIDOCAINE-EPINEPHRINE 1 %-1:100000 IJ SOLN
INTRAMUSCULAR | Status: DC | PRN
  Administered 2016-03-28: 5 mL

## 2016-03-28 MED ORDER — ACETAMINOPHEN 325 MG PO TABS: 650.0000 mg | ORAL_TABLET | Freq: Four times a day (QID) | ORAL | Status: DC | PRN

## 2016-03-28 MED ORDER — METHOCARBAMOL 1000 MG/10ML IJ SOLN
500.0000 mg | Freq: Four times a day (QID) | INTRAVENOUS | Status: DC | PRN
  Filled 2016-03-28: qty 5

## 2016-03-28 MED ORDER — MIDAZOLAM HCL 5 MG/5ML IJ SOLN
INTRAMUSCULAR | Status: DC | PRN
  Administered 2016-03-28: 2 mg via INTRAVENOUS

## 2016-03-28 MED ORDER — ONDANSETRON HCL 4 MG/2ML IJ SOLN
INTRAMUSCULAR | Status: DC | PRN
  Administered 2016-03-28: 4 mg via INTRAVENOUS

## 2016-03-28 MED ORDER — SODIUM CHLORIDE 0.9 % IV SOLN: 250.0000 mL | INTRAVENOUS | Status: DC

## 2016-03-28 MED ORDER — ACETAMINOPHEN 325 MG PO TABS: 650.0000 mg | ORAL_TABLET | ORAL | Status: DC | PRN

## 2016-03-28 MED ORDER — PHENOL 1.4 % MT LIQD: 1.0000 | OROMUCOSAL | Status: DC | PRN

## 2016-03-28 MED ORDER — BISACODYL 10 MG RE SUPP: 10.0000 mg | Freq: Every day | RECTAL | Status: DC | PRN

## 2016-03-28 MED ORDER — MENTHOL 3 MG MT LOZG: 1.0000 | LOZENGE | OROMUCOSAL | Status: DC | PRN

## 2016-03-28 MED ORDER — FENTANYL CITRATE (PF) 250 MCG/5ML IJ SOLN
INTRAMUSCULAR | Status: AC
  Filled 2016-03-28: qty 5

## 2016-03-28 MED ORDER — ROCURONIUM BROMIDE 50 MG/5ML IV SOLN
INTRAVENOUS | Status: AC
  Filled 2016-03-28: qty 1

## 2016-03-28 MED ORDER — HEMOSTATIC AGENTS (NO CHARGE) OPTIME
TOPICAL | Status: DC | PRN
  Administered 2016-03-28: 1 via TOPICAL

## 2016-03-28 MED ORDER — 0.9 % SODIUM CHLORIDE (POUR BTL) OPTIME
TOPICAL | Status: DC | PRN
  Administered 2016-03-28: 1000 mL

## 2016-03-28 MED ORDER — KCL IN DEXTROSE-NACL 20-5-0.45 MEQ/L-%-% IV SOLN
INTRAVENOUS | Status: DC
  Administered 2016-03-28: 13:00:00 via INTRAVENOUS

## 2016-03-28 MED ORDER — METHYLPREDNISOLONE ACETATE 80 MG/ML IJ SUSP
INTRAMUSCULAR | Status: DC | PRN
  Administered 2016-03-28: 80 mg

## 2016-03-28 MED ORDER — BUPIVACAINE HCL (PF) 0.5 % IJ SOLN
INTRAMUSCULAR | Status: DC | PRN
  Administered 2016-03-28: 5 mL

## 2016-03-28 MED ORDER — TIZANIDINE HCL 2 MG PO TABS: 2.0000 mg | ORAL_TABLET | Freq: Four times a day (QID) | ORAL | Status: DC | PRN

## 2016-03-28 MED ORDER — DARIFENACIN HYDROBROMIDE ER 7.5 MG PO TB24
7.5000 mg | ORAL_TABLET | Freq: Every day | ORAL | Status: DC
Start: 2016-03-29 — End: 2016-03-28

## 2016-03-28 MED ORDER — METHOCARBAMOL 500 MG PO TABS
ORAL_TABLET | ORAL | Status: AC
  Filled 2016-03-28: qty 1

## 2016-03-28 SURGICAL SUPPLY — 70 items
ADH SKN CLS APL DERMABOND .7 (GAUZE/BANDAGES/DRESSINGS) ×1
ADH SKN CLS LQ APL DERMABOND (GAUZE/BANDAGES/DRESSINGS) ×1
APL SKNCLS STERI-STRIP NONHPOA (GAUZE/BANDAGES/DRESSINGS)
BENZOIN TINCTURE PRP APPL 2/3 (GAUZE/BANDAGES/DRESSINGS) IMPLANT
BIT DRILL NEURO 2X3.1 SFT TUCH (MISCELLANEOUS) ×1 IMPLANT
BLADE CLIPPER SURG (BLADE) ×2 IMPLANT
BUR ROUND FLUTED 5 RND (BURR) ×2 IMPLANT
BUR ROUND FLUTED 5MM RND (BURR) ×1
CANISTER SUCT 3000ML PPV (MISCELLANEOUS) ×3 IMPLANT
CLOSURE WOUND 1/2 X4 (GAUZE/BANDAGES/DRESSINGS)
DECANTER SPIKE VIAL GLASS SM (MISCELLANEOUS) ×3 IMPLANT
DERMABOND ADHESIVE PROPEN (GAUZE/BANDAGES/DRESSINGS) ×2
DERMABOND ADVANCED (GAUZE/BANDAGES/DRESSINGS) ×2
DERMABOND ADVANCED .7 DNX12 (GAUZE/BANDAGES/DRESSINGS) ×1 IMPLANT
DERMABOND ADVANCED .7 DNX6 (GAUZE/BANDAGES/DRESSINGS) IMPLANT
DRAPE LAPAROTOMY 100X72X124 (DRAPES) ×3 IMPLANT
DRAPE MICROSCOPE LEICA (MISCELLANEOUS) IMPLANT
DRAPE POUCH INSTRU U-SHP 10X18 (DRAPES) ×3 IMPLANT
DRAPE SURG 17X23 STRL (DRAPES) ×3 IMPLANT
DRILL NEURO 2X3.1 SOFT TOUCH (MISCELLANEOUS) ×3
DRSG OPSITE POSTOP 3X4 (GAUZE/BANDAGES/DRESSINGS) ×2 IMPLANT
DURAPREP 26ML APPLICATOR (WOUND CARE) ×3 IMPLANT
ELECT REM PT RETURN 9FT ADLT (ELECTROSURGICAL) ×3
ELECTRODE REM PT RTRN 9FT ADLT (ELECTROSURGICAL) ×1 IMPLANT
GAUZE SPONGE 4X4 12PLY STRL (GAUZE/BANDAGES/DRESSINGS) IMPLANT
GAUZE SPONGE 4X4 16PLY XRAY LF (GAUZE/BANDAGES/DRESSINGS) IMPLANT
GLOVE BIO SURGEON STRL SZ8 (GLOVE) ×5 IMPLANT
GLOVE BIOGEL PI IND STRL 6.5 (GLOVE) IMPLANT
GLOVE BIOGEL PI IND STRL 7.0 (GLOVE) IMPLANT
GLOVE BIOGEL PI IND STRL 7.5 (GLOVE) IMPLANT
GLOVE BIOGEL PI IND STRL 8 (GLOVE) ×1 IMPLANT
GLOVE BIOGEL PI IND STRL 8.5 (GLOVE) ×1 IMPLANT
GLOVE BIOGEL PI INDICATOR 6.5 (GLOVE) ×4
GLOVE BIOGEL PI INDICATOR 7.0 (GLOVE) ×2
GLOVE BIOGEL PI INDICATOR 7.5 (GLOVE) ×2
GLOVE BIOGEL PI INDICATOR 8 (GLOVE) ×8
GLOVE BIOGEL PI INDICATOR 8.5 (GLOVE) ×2
GLOVE ECLIPSE 7.5 STRL STRAW (GLOVE) ×6 IMPLANT
GLOVE ECLIPSE 8.0 STRL XLNG CF (GLOVE) ×3 IMPLANT
GLOVE EXAM NITRILE LRG STRL (GLOVE) IMPLANT
GLOVE EXAM NITRILE MD LF STRL (GLOVE) IMPLANT
GLOVE EXAM NITRILE XL STR (GLOVE) IMPLANT
GLOVE EXAM NITRILE XS STR PU (GLOVE) IMPLANT
GLOVE SURG SS PI 6.5 STRL IVOR (GLOVE) ×4 IMPLANT
GLOVE SURG SS PI 7.0 STRL IVOR (GLOVE) ×2 IMPLANT
GOWN STRL REUS W/ TWL LRG LVL3 (GOWN DISPOSABLE) ×2 IMPLANT
GOWN STRL REUS W/ TWL XL LVL3 (GOWN DISPOSABLE) ×1 IMPLANT
GOWN STRL REUS W/TWL 2XL LVL3 (GOWN DISPOSABLE) ×9 IMPLANT
GOWN STRL REUS W/TWL LRG LVL3 (GOWN DISPOSABLE) ×6
GOWN STRL REUS W/TWL XL LVL3 (GOWN DISPOSABLE) ×9
KIT BASIN OR (CUSTOM PROCEDURE TRAY) ×3 IMPLANT
KIT ROOM TURNOVER OR (KITS) ×3 IMPLANT
NDL HYPO 18GX1.5 BLUNT FILL (NEEDLE) IMPLANT
NDL HYPO 25X1 1.5 SAFETY (NEEDLE) ×1 IMPLANT
NEEDLE HYPO 18GX1.5 BLUNT FILL (NEEDLE) ×3 IMPLANT
NEEDLE HYPO 25X1 1.5 SAFETY (NEEDLE) ×3 IMPLANT
NS IRRIG 1000ML POUR BTL (IV SOLUTION) ×3 IMPLANT
PACK LAMINECTOMY NEURO (CUSTOM PROCEDURE TRAY) ×3 IMPLANT
PAD ARMBOARD 7.5X6 YLW CONV (MISCELLANEOUS) ×13 IMPLANT
RUBBERBAND STERILE (MISCELLANEOUS) ×6 IMPLANT
SPONGE SURGIFOAM ABS GEL SZ50 (HEMOSTASIS) ×3 IMPLANT
STRIP CLOSURE SKIN 1/2X4 (GAUZE/BANDAGES/DRESSINGS) IMPLANT
SUT VIC AB 0 CT1 18XCR BRD8 (SUTURE) ×1 IMPLANT
SUT VIC AB 0 CT1 8-18 (SUTURE) ×3
SUT VIC AB 2-0 CT1 18 (SUTURE) ×3 IMPLANT
SUT VIC AB 3-0 SH 8-18 (SUTURE) ×3 IMPLANT
SYR 5ML LL (SYRINGE) ×2 IMPLANT
TOWEL OR 17X24 6PK STRL BLUE (TOWEL DISPOSABLE) ×3 IMPLANT
TOWEL OR 17X26 10 PK STRL BLUE (TOWEL DISPOSABLE) ×3 IMPLANT
WATER STERILE IRR 1000ML POUR (IV SOLUTION) ×3 IMPLANT

## 2016-03-28 NOTE — Progress Notes (Signed)
Awake, alert, conversant.  MAEW with good strength, apart from baseline left foot drop, which is unchanged.

## 2016-03-28 NOTE — Anesthesia Procedure Notes (Signed)
Procedure Name: Intubation Date/Time: 03/28/2016 11:11 AM Performed by: Jenne Campus Pre-anesthesia Checklist: Patient identified, Emergency Drugs available, Suction available, Patient being monitored and Timeout performed Oxygen Delivery Method: Circle system utilized Preoxygenation: Pre-oxygenation with 100% oxygen Intubation Type: IV induction Ventilation: Mask ventilation without difficulty and Oral airway inserted - appropriate to patient size Laryngoscope Size: Miller and 3 Grade View: Grade II Tube type: Oral Tube size: 7.5 mm Number of attempts: 1 Airway Equipment and Method: Stylet Placement Confirmation: ETT inserted through vocal cords under direct vision,  positive ETCO2 and breath sounds checked- equal and bilateral Secured at: 23 cm Tube secured with: Tape Dental Injury: Teeth and Oropharynx as per pre-operative assessment

## 2016-03-28 NOTE — Progress Notes (Signed)
Patient alert and oriented, mae's well, voiding adequate amount of urine, swallowing without difficulty, no c/o pain. Patient discharged home with family. Script and discharged instructions given to patient. Patient and family stated understanding of d/c instructions given and has an appointment with MD. 

## 2016-03-28 NOTE — Interval H&P Note (Signed)
History and Physical Interval Note:  03/28/2016 11:08 AM  [name]  has presented today for surgery, with the diagnosis of Foraminal stenosis of lumbar region  The various methods of treatment have been discussed with the patient and family. After consideration of risks, benefits and other options for treatment, the patient has consented to  Procedure(s) with comments: Left L3-4 Laminoforaminotomy (Left) - Left L3-4 Laminoforaminotomy as a surgical intervention .  The patient's history has been reviewed, patient examined, no change in status, stable for surgery.  I have reviewed the patient's chart and labs.  Questions were answered to the patient's satisfaction.     [name]

## 2016-03-28 NOTE — Anesthesia Preprocedure Evaluation (Addendum)
Anesthesia Evaluation  Patient identified by MRN, date of birth, ID band Patient awake    Reviewed: Allergy & Precautions, H&P , Patient's Chart, lab work & pertinent test results, reviewed documented beta blocker date and time   Airway Mallampati: II  TM Distance: >3 FB Neck ROM: full    Dental no notable dental hx. (+) Teeth Intact, Dental Advisory Given   Pulmonary    Pulmonary exam normal breath sounds clear to auscultation       Cardiovascular hypertension,  Rhythm:regular Rate:Normal     Neuro/Psych    GI/Hepatic   Endo/Other    Renal/GU      Musculoskeletal   Abdominal   Peds  Hematology   Anesthesia Other Findings   Reproductive/Obstetrics                            Anesthesia Physical Anesthesia Plan  ASA: II  Anesthesia Plan: General   Post-op Pain Management:    Induction: Intravenous  Airway Management Planned: Oral ETT  Additional Equipment:   Intra-op Plan:   Post-operative Plan: Extubation in OR  Informed Consent: I have reviewed the patients History and Physical, chart, labs and discussed the procedure including the risks, benefits and alternatives for the proposed anesthesia with the patient or authorized representative who has indicated his/her understanding and acceptance.   Dental Advisory Given and Dental advisory given  Plan Discussed with: CRNA and Surgeon  Anesthesia Plan Comments: (  Discussed general anesthesia, including possible nausea, instrumentation of airway, sore throat,pulmonary aspiration, etc. I asked if the were any outstanding questions, or  concerns before we proceeded. )        Anesthesia Quick Evaluation

## 2016-03-28 NOTE — Transfer of Care (Signed)
Immediate Anesthesia Transfer of Care Note  Patient: [name]  Procedure(s) Performed: Procedure(s) with comments: Left Lumbar three-four Laminoforaminotomy (Left) - left  Patient Location: PACU  Anesthesia Type:General  Level of Consciousness: awake, oriented and patient cooperative  Airway & Oxygen Therapy: Patient Spontanous Breathing and Patient connected to nasal cannula oxygen  Post-op Assessment: Report given to RN and Post -op Vital signs reviewed and stable  Post vital signs: Reviewed  Last Vitals:  Filed Vitals:   03/28/16 0734  BP: 133/65  Pulse: 62  Temp: 36.7 C  Resp: 18    Last Pain: There were no vitals filed for this visit.       Complications: No apparent anesthesia complications

## 2016-03-28 NOTE — H&P (Signed)
Patient ID:   [id number] Patient: [name]  Date of Birth: 1945/10/30 Visit Type: Office Visit   Date: 02/26/2016 11:30 AM Provider: Marchia Meiers. Vertell Limber MD   This 70 year old male presents for back pain.  History of Present Illness: 1.  back pain    [name], 70 year old retired male, visits for second opinion.  He recalls left side lumbar and left hip pain beginning in February 2017 after moving 40 pound bags of lime and several hundred cinderblocks.   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  AFO used intermittly.  She had required walker until injection.  Now using cane.  No longer taking pain medication.   ESI 2 in March and April were very helpful Left bursa injection late March  History: Prostate cancer 2013,  left hip fracture 1969 with nerve injury and foot drop, phlebitis Surgical history: Left total hip replacement 2008; right shoulder surgery 2006  MRI and x-rays on [place]        PAST MEDICAL/SURGICAL HISTORY   (Detailed)     PAST MEDICAL HISTORY, SURGICAL HISTORY, FAMILY HISTORY, SOCIAL HISTORY AND REVIEW OF SYSTEMS I have reviewed the patient's past medical, surgical, family and social history as well as the comprehensive review of systems as included on the [facility] history form dated 02/26/2016, which I have signed.  Family History  (Detailed)   SOCIAL HISTORY  (Detailed) Tobacco use reviewed. Preferred language is Unknown.   Smoking status: Never smoker.  SMOKING STATUS Use Status Type Smoking Status Usage Per Day Years Used Total Pack Years  no/never  Never smoker       HOME ENVIRONMENT/SAFETY The patient has not fallen in the last year.        MEDICATIONS(added, continued or stopped this visit): Started Medication Directions Instruction Stopped   Aspir-81 81 mg tablet,delayed release take 1 tablet by oral route  every day     Vesicare 5 mg tablet take 1 tablet by oral  route  every day       ALLERGIES: Ingredient Reaction Medication Name Comment  NO KNOWN ALLERGIES     No known allergies.   REVIEW OF SYSTEMS System Neg/Pos Details  Constitutional Negative Chills, fatigue, fever, malaise, night sweats, weight gain and weight loss.  ENMT Negative Ear drainage, hearing loss, nasal drainage, otalgia, sinus pressure and sore throat.  Eyes Negative Eye discharge, eye pain and vision changes.  Respiratory Negative Chronic cough, cough, dyspnea, known TB exposure and wheezing.  Cardio Negative Chest pain, claudication, edema and irregular heartbeat/palpitations.  GI Negative Abdominal pain, blood in stool, change in stool pattern, constipation, decreased appetite, diarrhea, heartburn, nausea and vomiting.  GU Negative Dribbling, dysuria, erectile dysfunction, hematuria, polyuria, slow stream, urinary frequency, urinary incontinence and urinary retention.  Endocrine Negative Cold intolerance, heat intolerance, polydipsia and polyphagia.  Neuro Negative Dizziness, extremity weakness, gait disturbance, headache, memory impairment, numbness in extremity, seizures and tremors.  Psych Negative Anxiety, depression and insomnia.  Integumentary Negative Brittle hair, brittle nails, change in shape/size of mole(s), hair loss, hirsutism, hives, pruritus, rash and skin lesion.  MS Positive Back pain.  Hema/Lymph Negative Easy bleeding, easy bruising and lymphadenopathy.  Allergic/Immuno Negative Contact allergy, environmental allergies, food allergies and seasonal allergies.  Reproductive Negative Penile discharge and sexual dysfunction.     Vitals Date Temp F BP Pulse Ht In Wt Lb BMI BSA Pain Score  02/26/2016  150/81 76 75 245 30.62  3/10  PHYSICAL EXAM General Level of Distress: no acute distress Overall Appearance: normal    Cardiovascular Cardiac: regular rate and rhythm without murmur  Respiratory Lungs: clear to  auscultation  Neurological Recent and Remote Memory: normal Attention Span and Concentration:   normal Language: normal Fund of Knowledge: normal  Right Left Sensation: normal normal Upper Extremity Coordination: normal normal  Lower Extremity Coordination: normal normal  Musculoskeletal Gait and Station: normal  Right Left Upper Extremity Muscle Strength: normal normal Lower Extremity Muscle Strength: normal normal Upper Extremity Muscle Tone:  normal normal Lower Extremity Muscle Tone: normal normal  Motor Strength Upper and lower extremity motor strength was tested in the clinically pertinent muscles.     Deep Tendon Reflexes  Right Left Biceps: normal normal Triceps: normal normal Brachiloradialis: normal normal Patellar: normal normal Achilles: normal normal  Sensory Sensation was tested at L1 to S1.   Cranial Nerves II. Optic Nerve/Visual Fields: normal III. Oculomotor: normal IV. Trochlear: normal V. Trigeminal: normal VI. Abducens: normal VII. Facial: normal VIII. Acoustic/Vestibular: normal IX. Glossopharyngeal: normal X. Vagus: normal XI. Spinal Accessory: normal XII. Hypoglossal: normal  Motor and other Tests Lhermittes: negative Rhomberg: negative    Right Left Hoffman's: normal normal Clonus: normal normal Babinski: normal normal SLR: negative positive Patrick's Corky Sox): negative negative Toe Walk: normal normal Toe Lift: normal abnormal Heel Walk: normal normal SI Joint: nontender nontender   Additional Findings:  No lumbar pain to palpation. Can't lift left foot up due to foot drop.    DIAGNOSTIC RESULTS 12/03/15 MRI: Far left lateral disc protrusion at L2-3 appears to contact the exiting left L2 nerve root beyond the foramen. Broad-based disc protrusion and moderate facet hypertrophy leads to moderate subarticular narrowing, left greater than right. Moderate left mild right foraminal stenosis is also present at L3-4. Mild  subarticular and foraminal narrowing at L4-5 is slightly worse on the left. Mild subarticular narrowing at L5-S1 is worse on the right. Scoliosis.  X-rays reveal normal curvature and fusion at L2-3 and L4-5.      IMPRESSION The patient is experiencing left side lumbar and leg pain. On review of his X-ray, he has a fusion at the  L2-3 and L4-5 levels, which is causing narrowing at the L3-4 level. Therefore, we will discuss the possibility of an L3-4 laminoforaminotomy to alleviate his pain.   Completed Orders (this encounter) Order Details Reason Side Interpretation Result Initial Treatment Date Region  Dietary management education, guidance, and counseling encouraged to eat a well balanced diet and follow up with primary care doctor.        Hypertension education Advice patient to monitor blood pressure.If bllod pressure remains elevated advice to contact primary care doctor        Lumbar Spine- AP/Lat/Obls/Spot/Flex/Ex      02/26/2016 All Levels to All Levels   Assessment/Plan # Detail Type Description   1. Assessment Foot drop, left (M21.372).       2. Assessment Body mass index (BMI) 30.0-30.9, adult (Z68.30).   Plan Orders Today's instructions / counseling include(s) Dietary management education, guidance, and counseling.       3. Assessment Elevated blood-pressure reading, w/o diagnosis of htn (R03.0).       4. Assessment Lumbar radiculopathy (M54.16).       5. 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         Pain Assessment/Treatment Pain Scale: 3/10. Method: Numeric Pain Intensity Scale. Location: back. Onset: 10/10/2015. Duration: varies. Quality: discomforting. Pain Assessment/Treatment follow-up plan of care:  Patient is taking medications prescribed.  Fall Risk Plan The patient has not fallen in the last year.  Follow up in 1 month. We discussed proceeding with an L3-4 laminoforaminotomy if he is not improving.   Orders: Diagnostic  Procedures: Assessment Procedure  M54.16 Lumbar Spine- AP/Lat/Obls/Spot/Flex/Ex  Instruction(s)/Education: Assessment Instruction  R03.0 Hypertension education  Z68.30 Dietary management education, guidance, and counseling             Provider:  Marchia Meiers. Vertell Limber MD  02/26/2016 01:44 PM Dictation edited by: Marchia Meiers. Vertell Limber    CC Providers: Erline Levine MD [address] [place], [place] [address]              Electronically signed by Marchia Meiers. Vertell Limber MD on 03/04/2016 03:58 PM

## 2016-03-28 NOTE — Brief Op Note (Signed)
03/28/2016  12:17 PM  PATIENT:  [name]  70 y.o. 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  PROCEDURE:  Procedure(s) with comments: Left Lumbar three-four Laminoforaminotomy (Left) - left with microdissection  SURGEON:  Surgeon(s) and Role:    * Erline Levine, MD - Primary    * Eustace Moore, MD - Assisting  PHYSICIAN ASSISTANT:   ASSISTANTS: Poteat, RN  ANESTHESIA:   general  EBL:     BLOOD ADMINISTERED:none  DRAINS: none   LOCAL MEDICATIONS USED:  MARCAINE    and LIDOCAINE   SPECIMEN:  No Specimen  DISPOSITION OF SPECIMEN:  N/A  COUNTS:  YES  TOURNIQUET:  * No tourniquets in log *  DICTATION: Patient has left foraminal stenosis at L 34 with significant left leg weakness and intractable pain. It was elected to take him to surgery for L 34 laminectomy and foraminotomy.  Procedure: Patient was brought to the operating room and following the smooth and uncomplicated induction of general endotracheal anesthesia he was placed in a prone position on the Wilson frame. Low back was prepped and draped in the usual sterile fashion with betadine scrub and DuraPrep. Preoperative localizing X ray was obtained with a spinal needle at L 34 level.  Area of planned incision was infiltrated with local lidocaine. Incision was made in the midline and carried to the lumbodorsal fascia which was incised on the left side of midline. Subperiosteal dissection was performed exposing what was felt to be L 34 level. Intraoperative x-ray demonstrated marker probe at L 34. A left foraminotomy of L3 and L4 was performed with leksell, then a high-speed drill and completed with Kerrison rongeurs and generous foraminotomy was performed to decompress the L3 and L4nerves. Ligamentum  flavum was detached and removed in a piecemeal fashion and the left  L 4 nerve root was decompressed laterally with removal of the superior aspect of the facet and ligamentum causing nerve root compression. Decompression was performed with the aid of the operating microscope utilizing microdissection technique.    At this point it was felt that all neural elements were well decompressed. The wound was then irrigated with saline. Hemostasis was assured with bipolar electrocautery and the interspace was irrigated with Depo-Medrol and fentanyl. The lumbodorsal fascia was closed with 0 Vicryl sutures the subcutaneous tissues reapproximated 2-0 Vicryl inverted sutures and the skin edges were reapproximated with 3-0 Vicryl subcuticular stitch. The wound was dressed with Dermabond and an occlusive dressing. Patient was extubated in the operating room and taken to recovery in stable and satisfactory condition having tolerated his operation well counts were correct at the end of the case.    PLAN OF CARE: Admit to inpatient   PATIENT DISPOSITION:  PACU - hemodynamically stable.   Delay start of Pharmacological VTE agent (>24hrs) due to surgical blood loss or risk of bleeding: yes

## 2016-03-28 NOTE — Anesthesia Postprocedure Evaluation (Signed)
Anesthesia Post Note  Patient: [name]  Procedure(s) Performed: Procedure(s) (LRB): Left Lumbar three-four Laminoforaminotomy (Left)  Patient location during evaluation: PACU Anesthesia Type: General Level of consciousness: sedated Pain management: satisfactory to patient Vital Signs Assessment: post-procedure vital signs reviewed and stable Respiratory status: spontaneous breathing Cardiovascular status: stable Anesthetic complications: no    Last Vitals:  Filed Vitals:   03/28/16 1305 03/28/16 1319  BP: 133/75 139/65  Pulse: 52 50  Temp:  36.4 C  Resp: 20 18    Last Pain:  Filed Vitals:   03/28/16 1329  PainSc: 2                  Savio Albrecht EDWARD

## 2016-03-28 NOTE — Op Note (Signed)
03/28/2016  12:17 PM  PATIENT:  [name]  70 y.o. 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  PROCEDURE:  Procedure(s) with comments: Left Lumbar three-four Laminoforaminotomy (Left) - left with microdissection  SURGEON:  Surgeon(s) and Role:    * Erline Levine, MD - Primary    * Eustace Moore, MD - Assisting  PHYSICIAN ASSISTANT:   ASSISTANTS: Poteat, RN  ANESTHESIA:   general  EBL:     BLOOD ADMINISTERED:none  DRAINS: none   LOCAL MEDICATIONS USED:  MARCAINE    and LIDOCAINE   SPECIMEN:  No Specimen  DISPOSITION OF SPECIMEN:  N/A  COUNTS:  YES  TOURNIQUET:  * No tourniquets in log *  DICTATION: Patient has left foraminal stenosis at L 34 with significant left leg weakness and intractable pain. It was elected to take him to surgery for L 34 laminectomy and foraminotomy.  Procedure: Patient was brought to the operating room and following the smooth and uncomplicated induction of general endotracheal anesthesia he was placed in a prone position on the Wilson frame. Low back was prepped and draped in the usual sterile fashion with betadine scrub and DuraPrep. Preoperative localizing X ray was obtained with a spinal needle at L 34 level.  Area of planned incision was infiltrated with local lidocaine. Incision was made in the midline and carried to the lumbodorsal fascia which was incised on the left side of midline. Subperiosteal dissection was performed exposing what was felt to be L 34 level. Intraoperative x-ray demonstrated marker probe at L 34. A left foraminotomy of L3 and L4 was performed with leksell, then a high-speed drill and completed with Kerrison rongeurs and generous foraminotomy was performed to decompress the L3 and L4nerves. Ligamentum  flavum was detached and removed in a piecemeal fashion and the left  L 4 nerve root was decompressed laterally with removal of the superior aspect of the facet and ligamentum causing nerve root compression. Decompression was performed with the aid of the operating microscope utilizing microdissection technique.    At this point it was felt that all neural elements were well decompressed. The wound was then irrigated with saline. Hemostasis was assured with bipolar electrocautery and the interspace was irrigated with Depo-Medrol and fentanyl. The lumbodorsal fascia was closed with 0 Vicryl sutures the subcutaneous tissues reapproximated 2-0 Vicryl inverted sutures and the skin edges were reapproximated with 3-0 Vicryl subcuticular stitch. The wound was dressed with Dermabond and an occlusive dressing. Patient was extubated in the operating room and taken to recovery in stable and satisfactory condition having tolerated his operation well counts were correct at the end of the case.    PLAN OF CARE: Admit to inpatient   PATIENT DISPOSITION:  PACU - hemodynamically stable.   Delay start of Pharmacological VTE agent (>24hrs) due to surgical blood loss or risk of bleeding: yes

## 2016-03-29 ENCOUNTER — Encounter (HOSPITAL_COMMUNITY): Payer: Self-pay | Admitting: Neurosurgery

## 2016-04-22 DIAGNOSIS — M7062 Trochanteric bursitis, left hip: Secondary | ICD-10-CM | POA: Diagnosis not present

## 2016-05-09 DIAGNOSIS — M25552 Pain in left hip: Secondary | ICD-10-CM | POA: Diagnosis not present

## 2016-05-09 DIAGNOSIS — Z96642 Presence of left artificial hip joint: Secondary | ICD-10-CM | POA: Diagnosis not present

## 2016-05-27 ENCOUNTER — Other Ambulatory Visit: Payer: Self-pay | Admitting: Physician Assistant

## 2016-05-27 DIAGNOSIS — Z96642 Presence of left artificial hip joint: Secondary | ICD-10-CM | POA: Diagnosis not present

## 2016-05-27 DIAGNOSIS — M25552 Pain in left hip: Secondary | ICD-10-CM | POA: Diagnosis not present

## 2016-05-30 ENCOUNTER — Encounter (HOSPITAL_COMMUNITY)
Admission: RE | Admit: 2016-05-30 | Discharge: 2016-05-30 | Disposition: A | Discharge status: Home / Self Care | Payer: PPO | Source: Ambulatory Visit | Attending: Orthopaedic Surgery | Admitting: Orthopaedic Surgery

## 2016-05-30 ENCOUNTER — Encounter (HOSPITAL_COMMUNITY): Payer: Self-pay

## 2016-05-30 DIAGNOSIS — Z01812 Encounter for preprocedural laboratory examination: Secondary | ICD-10-CM | POA: Insufficient documentation

## 2016-05-30 DIAGNOSIS — I10 Essential (primary) hypertension: Secondary | ICD-10-CM | POA: Diagnosis not present

## 2016-05-30 DIAGNOSIS — I498 Other specified cardiac arrhythmias: Secondary | ICD-10-CM | POA: Insufficient documentation

## 2016-05-30 LAB — SURGICAL PCR SCREEN
MRSA, PCR: NEGATIVE
STAPHYLOCOCCUS AUREUS: NEGATIVE

## 2016-05-30 LAB — BASIC METABOLIC PANEL
Anion gap: 7 (ref 5–15)
BUN: 17 mg/dL (ref 6–20)
CHLORIDE: 99 mmol/L — AB (ref 101–111)
CO2: 27 mmol/L (ref 22–32)
Calcium: 10 mg/dL (ref 8.9–10.3)
Creatinine, Ser: 1.03 mg/dL (ref 0.61–1.24)
GFR calc non Af Amer: >60 mL/min (ref >60)
GLUCOSE: 109 mg/dL — AB (ref 65–99)
POTASSIUM: 4.7 mmol/L (ref 3.5–5.1)
SODIUM: 133 mmol/L — AB (ref 135–145)

## 2016-05-30 LAB — CBC
HEMATOCRIT: 38.6 % — AB (ref 39.0–52.0)
HEMOGLOBIN: 12.6 g/dL — AB (ref 13.0–17.0)
MCH: 25.4 pg — ABNORMAL LOW (ref 26.0–34.0)
MCHC: 32.6 g/dL (ref 30.0–36.0)
MCV: 77.7 fL — AB (ref 78.0–100.0)
Platelets: 351 10*3/uL (ref 150–400)
RBC: 4.97 MIL/uL (ref 4.22–5.81)
RDW: 16.2 % — AB (ref 11.5–15.5)
WBC: 7.8 10*3/uL (ref 4.0–10.5)

## 2016-05-30 LAB — ABO/RH: ABO/RH(D): A POS

## 2016-05-30 NOTE — Progress Notes (Signed)
States does not need clear liquid instructions- will only drink water am of surgery

## 2016-05-30 NOTE — Patient Instructions (Addendum)
SHAHRIAR BRETTSCHNEIDER  05/30/2016   Your procedure is scheduled on: 06/07/16  Report to [facility] Main  Entrance take [place]  elevators to 3rd floor to  [place] at 1230 PM  Call this number if you have problems the morning of surgery [phone]   Remember: ONLY 1 PERSON MAY GO WITH YOU TO SHORT STAY TO GET  READY MORNING OF YOUR SURGERY.  Do not eat food After Midnight. MAY HAVE CLEAR LIQUIDS MORNING OF SURGERY UNTIL 0830 AM--- THEN NOTHING BY MOUTH     Take these medicines the morning of surgery with A SIP OF WATER: NO REGULAR MEDS MAY TAKE OXYCODONE OR TRAMADOL IF NEEDED                                You may not have any metal on your body including hair pins and              piercings  Do not wear jewelry, make-up, lotions, powders or perfumes, deodorant             Do not wear nail polish.  Do not shave  48 hours prior to surgery.              Men may shave face and neck.   Do not bring valuables to the hospital. [place].  Contacts, dentures or bridgework may not be worn into surgery.  Leave suitcase in the car. After surgery it may be brought to your room.           [place] - Preparing for Surgery Before surgery, you can play an important role.  Because skin is not sterile, your skin needs to be as free of germs as possible.  You can reduce the number of germs on your skin by washing with CHG (chlorahexidine gluconate) soap before surgery.  CHG is an antiseptic cleaner which kills germs and bonds with the skin to continue killing germs even after washing. Please DO NOT use if you have an allergy to CHG or antibacterial soaps.  If your skin becomes reddened/irritated stop using the CHG and inform your nurse when you arrive at Short Stay. Do not shave (including legs and underarms) for at least 48 hours prior to the first CHG shower.  You may shave your face/neck. Please follow these instructions  carefully:  1.  Shower with CHG Soap the night before surgery and the  morning of Surgery.  2.  If you choose to wash your hair, wash your hair first as usual with your  normal  shampoo.  3.  After you shampoo, rinse your hair and body thoroughly to remove the  shampoo.                           4.  Use CHG as you would any other liquid soap.  You can apply chg directly  to the skin and wash                       Gently with a scrungie or clean washcloth.  5.  Apply the CHG Soap to your body ONLY FROM THE NECK DOWN.  Do not use on face/ open                           Wound or open sores. Avoid contact with eyes, ears mouth and genitals (private parts).                       Wash face,  Genitals (private parts) with your normal soap.             6.  Wash thoroughly, paying special attention to the area where your surgery  will be performed.  7.  Thoroughly rinse your body with warm water from the neck down.  8.  DO NOT shower/wash with your normal soap after using and rinsing off  the CHG Soap.                9.  Pat yourself dry with a clean towel.            10.  Wear clean pajamas.            11.  Place clean sheets on your bed the night of your first shower and do not  sleep with pets. Day of Surgery : Do not apply any lotions/deodorants the morning of surgery.  Please wear clean clothes to the hospital/surgery center.  FAILURE TO FOLLOW THESE INSTRUCTIONS MAY RESULT IN THE CANCELLATION OF YOUR SURGERY PATIENT SIGNATURE_________________________________  NURSE SIGNATURE__________________________________  ________________________________________________________________________   [name]  An incentive spirometer is a tool that can help keep your lungs clear and active. This tool measures how well you are filling your lungs with each breath. Taking long deep breaths may help reverse or decrease the chance of developing breathing (pulmonary) problems (especially infection)  following:  A long period of time when you are unable to move or be active. BEFORE THE PROCEDURE   If the spirometer includes an indicator to show your best effort, your nurse or respiratory therapist will set it to a desired goal.  If possible, sit up straight or lean slightly forward. Try not to slouch.  Hold the incentive spirometer in an upright position. INSTRUCTIONS FOR USE  1. Sit on the edge of your bed if possible, or sit up as far as you can in bed or on a chair. 2. Hold the incentive spirometer in an upright position. 3. Breathe out normally. 4. Place the mouthpiece in your mouth and seal your lips tightly around it. 5. Breathe in slowly and as deeply as possible, raising the piston or the ball toward the top of the column. 6. Hold your breath for 3-5 seconds or for as long as possible. Allow the piston or ball to fall to the bottom of the column. 7. Remove the mouthpiece from your mouth and breathe out normally. 8. Rest for a few seconds and repeat Steps 1 through 7 at least 10 times every 1-2 hours when you are awake. Take your time and take a few normal breaths between deep breaths. 9. The spirometer may include an indicator to show your best effort. Use the indicator as a goal to work toward during each repetition. 10. After each set of 10 deep breaths, practice coughing to be sure your lungs are clear. If you have an incision (the cut made at the time of surgery), support your incision when coughing by placing a pillow or rolled up towels firmly against it. Once you are able to get out of  bed, walk around indoors and cough well. You may stop using the incentive spirometer when instructed by your caregiver.  RISKS AND COMPLICATIONS  Take your time so you do not get dizzy or light-headed.  If you are in pain, you may need to take or ask for pain medication before doing incentive spirometry. It is harder to take a deep breath if you are having pain. AFTER USE  Rest and  breathe slowly and easily.  It can be helpful to keep track of a log of your progress. Your caregiver can provide you with a simple table to help with this. If you are using the spirometer at home, follow these instructions: [place] IF:   You are having difficultly using the spirometer.  You have trouble using the spirometer as often as instructed.  Your pain medication is not giving enough relief while using the spirometer.  You develop fever of 100.5 F (38.1 C) or higher. SEEK IMMEDIATE MEDICAL CARE IF:   You cough up bloody sputum that had not been present before.  You develop fever of 102 F (38.9 C) or greater.  You develop worsening pain at or near the incision site. MAKE SURE YOU:   Understand these instructions.  Will watch your condition.  Will get help right away if you are not doing well or get worse. Document Released: 02/03/2007 Document Revised: 12/16/2011 Document Reviewed: 04/06/2007 [facility] Patient Information 2014 [place], [place].   ________________________________________________________________________

## 2016-05-30 NOTE — Progress Notes (Signed)
   05/30/16 1428  OBSTRUCTIVE SLEEP APNEA  Have you ever been diagnosed with sleep apnea through a sleep study? No  Do you snore loudly (loud enough to be heard through closed doors)?  1  Do you often feel tired, fatigued, or sleepy during the daytime (such as falling asleep during driving or talking to someone)? 0  Has anyone observed you stop breathing during your sleep? 1  Do you have, or are you being treated for high blood pressure? 1  BMI more than 35 kg/m2? 0  Age > 50 (1-yes) 1  Neck circumference greater than:Male 16 inches or larger, Male 17inches or larger? 1  Male Gender (Yes=1) 1  Obstructive Sleep Apnea Score 6

## 2016-06-07 ENCOUNTER — Encounter (HOSPITAL_COMMUNITY)
Admission: RE | Disposition: A | Discharge status: Home Health | Payer: Self-pay | Source: Ambulatory Visit | Attending: Orthopaedic Surgery

## 2016-06-07 ENCOUNTER — Inpatient Hospital Stay (HOSPITAL_COMMUNITY): Payer: PPO | Admitting: Anesthesiology

## 2016-06-07 ENCOUNTER — Encounter (HOSPITAL_COMMUNITY): Payer: Self-pay | Admitting: *Deleted

## 2016-06-07 ENCOUNTER — Inpatient Hospital Stay (HOSPITAL_COMMUNITY): Payer: PPO

## 2016-06-07 ENCOUNTER — Inpatient Hospital Stay (HOSPITAL_COMMUNITY)
Admission: RE | Admit: 2016-06-07 | Discharge: 2016-06-11 | DRG: 467 | Disposition: A | Discharge status: Home Health | Payer: PPO | Source: Ambulatory Visit | Attending: Orthopaedic Surgery | Admitting: Orthopaedic Surgery

## 2016-06-07 DIAGNOSIS — M25452 Effusion, left hip: Secondary | ICD-10-CM | POA: Diagnosis not present

## 2016-06-07 DIAGNOSIS — M170 Bilateral primary osteoarthritis of knee: Secondary | ICD-10-CM | POA: Diagnosis present

## 2016-06-07 DIAGNOSIS — Z419 Encounter for procedure for purposes other than remedying health state, unspecified: Secondary | ICD-10-CM

## 2016-06-07 DIAGNOSIS — T84051A Periprosthetic osteolysis of internal prosthetic left hip joint, initial encounter: Secondary | ICD-10-CM | POA: Diagnosis not present

## 2016-06-07 DIAGNOSIS — Z4689 Encounter for fitting and adjustment of other specified devices: Secondary | ICD-10-CM | POA: Diagnosis not present

## 2016-06-07 DIAGNOSIS — Z79899 Other long term (current) drug therapy: Secondary | ICD-10-CM

## 2016-06-07 DIAGNOSIS — Z86010 Personal history of colonic polyps: Secondary | ICD-10-CM | POA: Diagnosis not present

## 2016-06-07 DIAGNOSIS — Z8546 Personal history of malignant neoplasm of prostate: Secondary | ICD-10-CM | POA: Diagnosis not present

## 2016-06-07 DIAGNOSIS — T84021A Dislocation of internal left hip prosthesis, initial encounter: Secondary | ICD-10-CM | POA: Diagnosis not present

## 2016-06-07 DIAGNOSIS — B9561 Methicillin susceptible Staphylococcus aureus infection as the cause of diseases classified elsewhere: Secondary | ICD-10-CM | POA: Diagnosis not present

## 2016-06-07 DIAGNOSIS — M160 Bilateral primary osteoarthritis of hip: Secondary | ICD-10-CM | POA: Diagnosis not present

## 2016-06-07 DIAGNOSIS — I872 Venous insufficiency (chronic) (peripheral): Secondary | ICD-10-CM | POA: Diagnosis not present

## 2016-06-07 DIAGNOSIS — Z7982 Long term (current) use of aspirin: Secondary | ICD-10-CM

## 2016-06-07 DIAGNOSIS — Z8249 Family history of ischemic heart disease and other diseases of the circulatory system: Secondary | ICD-10-CM | POA: Diagnosis not present

## 2016-06-07 DIAGNOSIS — N182 Chronic kidney disease, stage 2 (mild): Secondary | ICD-10-CM | POA: Diagnosis present

## 2016-06-07 DIAGNOSIS — D62 Acute posthemorrhagic anemia: Secondary | ICD-10-CM | POA: Diagnosis not present

## 2016-06-07 DIAGNOSIS — Z8781 Personal history of (healed) traumatic fracture: Secondary | ICD-10-CM | POA: Diagnosis not present

## 2016-06-07 DIAGNOSIS — Z96649 Presence of unspecified artificial hip joint: Secondary | ICD-10-CM

## 2016-06-07 DIAGNOSIS — T8489XA Other specified complication of internal orthopedic prosthetic devices, implants and grafts, initial encounter: Secondary | ICD-10-CM

## 2016-06-07 DIAGNOSIS — M25552 Pain in left hip: Secondary | ICD-10-CM | POA: Diagnosis not present

## 2016-06-07 DIAGNOSIS — Y792 Prosthetic and other implants, materials and accessory orthopedic devices associated with adverse incidents: Secondary | ICD-10-CM | POA: Diagnosis present

## 2016-06-07 DIAGNOSIS — Z471 Aftercare following joint replacement surgery: Secondary | ICD-10-CM | POA: Diagnosis not present

## 2016-06-07 DIAGNOSIS — Z96642 Presence of left artificial hip joint: Secondary | ICD-10-CM | POA: Diagnosis not present

## 2016-06-07 HISTORY — PX: ANTERIOR HIP REVISION: SHX6527

## 2016-06-07 SURGERY — REVISION, TOTAL ARTHROPLASTY, HIP, ANTERIOR APPROACH
Anesthesia: Spinal | Site: Hip | Laterality: Left

## 2016-06-07 MED ORDER — PROPOFOL 10 MG/ML IV BOLUS
INTRAVENOUS | Status: AC
  Filled 2016-06-07: qty 40

## 2016-06-07 MED ORDER — HYDROMORPHONE HCL 1 MG/ML IJ SOLN
1.0000 mg | INTRAMUSCULAR | Status: DC | PRN
  Administered 2016-06-07: 1 mg via INTRAVENOUS
  Filled 2016-06-07: qty 1

## 2016-06-07 MED ORDER — FENTANYL CITRATE (PF) 100 MCG/2ML IJ SOLN
INTRAMUSCULAR | Status: AC
Start: 2016-06-07 — End: 2016-06-07
  Filled 2016-06-07: qty 2

## 2016-06-07 MED ORDER — MIDAZOLAM HCL 2 MG/2ML IJ SOLN
INTRAMUSCULAR | Status: AC
  Filled 2016-06-07: qty 2

## 2016-06-07 MED ORDER — MENTHOL 3 MG MT LOZG
1.0000 | LOZENGE | OROMUCOSAL | Status: DC | PRN
Start: 2016-06-07 — End: 2016-06-11

## 2016-06-07 MED ORDER — METHOCARBAMOL 1000 MG/10ML IJ SOLN
500.0000 mg | Freq: Four times a day (QID) | INTRAVENOUS | Status: DC | PRN
  Administered 2016-06-07: 500 mg via INTRAVENOUS
  Filled 2016-06-07: qty 550
  Filled 2016-06-07: qty 5

## 2016-06-07 MED ORDER — CHLORHEXIDINE GLUCONATE 4 % EX LIQD: 60.0000 mL | Freq: Once | CUTANEOUS | Status: DC

## 2016-06-07 MED ORDER — ONDANSETRON HCL 4 MG/2ML IJ SOLN: 4.0000 mg | Freq: Four times a day (QID) | INTRAMUSCULAR | Status: DC | PRN

## 2016-06-07 MED ORDER — PROPOFOL 10 MG/ML IV BOLUS
INTRAVENOUS | Status: AC
Start: 2016-06-07 — End: 2016-06-07
  Filled 2016-06-07: qty 20

## 2016-06-07 MED ORDER — PROPOFOL 10 MG/ML IV BOLUS
INTRAVENOUS | Status: AC
  Filled 2016-06-07: qty 20

## 2016-06-07 MED ORDER — DOCUSATE SODIUM 100 MG PO CAPS
100.0000 mg | ORAL_CAPSULE | Freq: Two times a day (BID) | ORAL | Status: DC
  Administered 2016-06-07 – 2016-06-11 (×8): 100 mg via ORAL
  Filled 2016-06-07 (×8): qty 1

## 2016-06-07 MED ORDER — LACTATED RINGERS IV SOLN: INTRAVENOUS | Status: DC

## 2016-06-07 MED ORDER — ALUM & MAG HYDROXIDE-SIMETH 200-200-20 MG/5ML PO SUSP: 30.0000 mL | ORAL | Status: DC | PRN

## 2016-06-07 MED ORDER — CEFAZOLIN SODIUM-DEXTROSE 2-4 GM/100ML-% IV SOLN
2.0000 g | INTRAVENOUS | Status: AC
  Administered 2016-06-07: 2 g via INTRAVENOUS
  Filled 2016-06-07: qty 100

## 2016-06-07 MED ORDER — ONDANSETRON HCL 4 MG/2ML IJ SOLN
INTRAMUSCULAR | Status: DC | PRN
  Administered 2016-06-07: 4 mg via INTRAVENOUS

## 2016-06-07 MED ORDER — METOCLOPRAMIDE HCL 5 MG/ML IJ SOLN: 5.0000 mg | Freq: Three times a day (TID) | INTRAMUSCULAR | Status: DC | PRN

## 2016-06-07 MED ORDER — METHOCARBAMOL 500 MG PO TABS
500.0000 mg | ORAL_TABLET | Freq: Four times a day (QID) | ORAL | Status: DC | PRN
  Administered 2016-06-08 – 2016-06-11 (×8): 500 mg via ORAL
  Filled 2016-06-07 (×9): qty 1

## 2016-06-07 MED ORDER — IBUPROFEN 400 MG PO TABS
800.0000 mg | ORAL_TABLET | Freq: Once | ORAL | Status: AC
  Administered 2016-06-07: 800 mg via ORAL
  Filled 2016-06-07: qty 2

## 2016-06-07 MED ORDER — PHENYLEPHRINE HCL 10 MG/ML IJ SOLN
INTRAMUSCULAR | Status: AC
  Filled 2016-06-07: qty 1

## 2016-06-07 MED ORDER — ONDANSETRON HCL 4 MG PO TABS: 4.0000 mg | ORAL_TABLET | Freq: Four times a day (QID) | ORAL | Status: DC | PRN

## 2016-06-07 MED ORDER — ACETAMINOPHEN 650 MG RE SUPP: 650.0000 mg | Freq: Four times a day (QID) | RECTAL | Status: DC | PRN

## 2016-06-07 MED ORDER — OXYCODONE HCL 5 MG PO TABS
5.0000 mg | ORAL_TABLET | ORAL | Status: DC | PRN
  Administered 2016-06-07: 5 mg via ORAL
  Administered 2016-06-07 – 2016-06-08 (×4): 10 mg via ORAL
  Administered 2016-06-09: 5 mg via ORAL
  Administered 2016-06-09: 10 mg via ORAL
  Administered 2016-06-10 – 2016-06-11 (×3): 5 mg via ORAL
  Filled 2016-06-07 (×2): qty 1
  Filled 2016-06-07 (×2): qty 2
  Filled 2016-06-07: qty 1
  Filled 2016-06-07 (×3): qty 2
  Filled 2016-06-07: qty 1
  Filled 2016-06-07: qty 2
  Filled 2016-06-07: qty 1

## 2016-06-07 MED ORDER — BUPIVACAINE HCL (PF) 0.5 % IJ SOLN
INTRAMUSCULAR | Status: DC | PRN
  Administered 2016-06-07: 3 mL

## 2016-06-07 MED ORDER — ONDANSETRON HCL 4 MG/2ML IJ SOLN
INTRAMUSCULAR | Status: AC
  Filled 2016-06-07: qty 2

## 2016-06-07 MED ORDER — SODIUM CHLORIDE 0.9 % IV SOLN
INTRAVENOUS | Status: DC
  Administered 2016-06-07 – 2016-06-10 (×2): via INTRAVENOUS

## 2016-06-07 MED ORDER — PROPOFOL 10 MG/ML IV BOLUS
INTRAVENOUS | Status: DC | PRN
  Administered 2016-06-07: 30 mg via INTRAVENOUS
  Administered 2016-06-07 (×2): 20 mg via INTRAVENOUS

## 2016-06-07 MED ORDER — FENTANYL CITRATE (PF) 100 MCG/2ML IJ SOLN
INTRAMUSCULAR | Status: DC | PRN
  Administered 2016-06-07: 50 ug via INTRAVENOUS

## 2016-06-07 MED ORDER — LIDOCAINE 2% (20 MG/ML) 5 ML SYRINGE
INTRAMUSCULAR | Status: AC
Start: 2016-06-07 — End: 2016-06-07
  Filled 2016-06-07: qty 5

## 2016-06-07 MED ORDER — ACETAMINOPHEN 325 MG PO TABS
650.0000 mg | ORAL_TABLET | Freq: Four times a day (QID) | ORAL | Status: DC | PRN
Start: 2016-06-07 — End: 2016-06-11
  Administered 2016-06-07 – 2016-06-09 (×3): 650 mg via ORAL
  Filled 2016-06-07 (×3): qty 2

## 2016-06-07 MED ORDER — PROPOFOL 500 MG/50ML IV EMUL
INTRAVENOUS | Status: DC | PRN
  Administered 2016-06-07: 75 ug/kg/min via INTRAVENOUS

## 2016-06-07 MED ORDER — HYDROMORPHONE HCL 1 MG/ML IJ SOLN
INTRAMUSCULAR | Status: AC
Start: 2016-06-07 — End: 2016-06-07
  Administered 2016-06-07: 0.25 mg via INTRAVENOUS
  Filled 2016-06-07: qty 1

## 2016-06-07 MED ORDER — DIPHENHYDRAMINE HCL 12.5 MG/5ML PO ELIX: 12.5000 mg | ORAL_SOLUTION | ORAL | Status: DC | PRN

## 2016-06-07 MED ORDER — MIDAZOLAM HCL 5 MG/5ML IJ SOLN
INTRAMUSCULAR | Status: DC | PRN
  Administered 2016-06-07: 2 mg via INTRAVENOUS

## 2016-06-07 MED ORDER — PROMETHAZINE HCL 25 MG/ML IJ SOLN: 6.2500 mg | INTRAMUSCULAR | Status: DC | PRN

## 2016-06-07 MED ORDER — SODIUM CHLORIDE 0.9 % IR SOLN
Status: DC | PRN
  Administered 2016-06-07: 4000 mL

## 2016-06-07 MED ORDER — PHENOL 1.4 % MT LIQD: 1.0000 | OROMUCOSAL | Status: DC | PRN

## 2016-06-07 MED ORDER — TRANEXAMIC ACID 1000 MG/10ML IV SOLN
1000.0000 mg | INTRAVENOUS | Status: AC
  Administered 2016-06-07: 1000 mg via INTRAVENOUS
  Filled 2016-06-07: qty 10

## 2016-06-07 MED ORDER — 0.9 % SODIUM CHLORIDE (POUR BTL) OPTIME
TOPICAL | Status: DC | PRN
  Administered 2016-06-07: 1000 mL

## 2016-06-07 MED ORDER — LACTATED RINGERS IV SOLN
INTRAVENOUS | Status: DC | PRN
  Administered 2016-06-07 (×3): via INTRAVENOUS

## 2016-06-07 MED ORDER — BUPIVACAINE HCL (PF) 0.5 % IJ SOLN
INTRAMUSCULAR | Status: AC
  Filled 2016-06-07: qty 30

## 2016-06-07 MED ORDER — CEFAZOLIN IN D5W 1 GM/50ML IV SOLN
1.0000 g | Freq: Four times a day (QID) | INTRAVENOUS | Status: AC
  Administered 2016-06-07 – 2016-06-08 (×2): 1 g via INTRAVENOUS
  Filled 2016-06-07 (×2): qty 50

## 2016-06-07 MED ORDER — CEFAZOLIN SODIUM-DEXTROSE 2-4 GM/100ML-% IV SOLN
INTRAVENOUS | Status: AC
  Filled 2016-06-07: qty 100

## 2016-06-07 MED ORDER — METOCLOPRAMIDE HCL 5 MG PO TABS: 5.0000 mg | ORAL_TABLET | Freq: Three times a day (TID) | ORAL | Status: DC | PRN

## 2016-06-07 MED ORDER — DEXTROSE 5 % IV SOLN
INTRAVENOUS | Status: DC | PRN
  Administered 2016-06-07: 10 ug/min via INTRAVENOUS

## 2016-06-07 MED ORDER — HYDROMORPHONE HCL 1 MG/ML IJ SOLN
0.2500 mg | INTRAMUSCULAR | Status: DC | PRN
  Administered 2016-06-07 (×2): 0.25 mg via INTRAVENOUS

## 2016-06-07 MED ORDER — ASPIRIN EC 325 MG PO TBEC
325.0000 mg | DELAYED_RELEASE_TABLET | Freq: Two times a day (BID) | ORAL | Status: DC
  Administered 2016-06-07 – 2016-06-11 (×8): 325 mg via ORAL
  Filled 2016-06-07 (×8): qty 1

## 2016-06-07 MED ORDER — MEPERIDINE HCL 50 MG/ML IJ SOLN: 6.2500 mg | INTRAMUSCULAR | Status: DC | PRN

## 2016-06-07 SURGICAL SUPPLY — 47 items
APL SKNCLS STERI-STRIP NONHPOA (GAUZE/BANDAGES/DRESSINGS) ×1
BAG SPEC THK2 15X12 ZIP CLS (MISCELLANEOUS)
BAG ZIPLOCK 12X15 (MISCELLANEOUS) IMPLANT
BENZOIN TINCTURE PRP APPL 2/3 (GAUZE/BANDAGES/DRESSINGS) ×3 IMPLANT
BLADE SAW SGTL 18X1.27X75 (BLADE) ×2 IMPLANT
BLADE SAW SGTL 18X1.27X75MM (BLADE) ×1
CELLS DAT CNTRL 66122 CELL SVR (MISCELLANEOUS) ×1 IMPLANT
CLOSURE WOUND 1/2 X4 (GAUZE/BANDAGES/DRESSINGS) ×1
CLOTH BEACON ORANGE TIMEOUT ST (SAFETY) ×3 IMPLANT
COVER PERINEAL POST (MISCELLANEOUS) ×3 IMPLANT
CUP GRIPTION SECTOR 62MM (Orthopedic Implant) ×3 IMPLANT
DRAPE STERI IOBAN 125X83 (DRAPES) ×3 IMPLANT
DRAPE U-SHAPE 47X51 STRL (DRAPES) ×6 IMPLANT
DRSG AQUACEL AG ADV 3.5X10 (GAUZE/BANDAGES/DRESSINGS) ×3 IMPLANT
DURAPREP 26ML APPLICATOR (WOUND CARE) ×3 IMPLANT
ELECT REM PT RETURN 15FT ADLT (MISCELLANEOUS) IMPLANT
ELECT REM PT RETURN 9FT ADLT (ELECTROSURGICAL) ×3
ELECTRODE REM PT RTRN 9FT ADLT (ELECTROSURGICAL) ×1 IMPLANT
GAUZE XEROFORM 1X8 LF (GAUZE/BANDAGES/DRESSINGS) ×3 IMPLANT
GLOVE BIO SURGEON STRL SZ7.5 (GLOVE) ×6 IMPLANT
GLOVE ECLIPSE 8.0 STRL XLNG CF (GLOVE) ×3 IMPLANT
GLOVE INDICATOR 8.0 STRL GRN (GLOVE) ×6 IMPLANT
GOWN STRL REUS W/TWL LRG LVL3 (GOWN DISPOSABLE) ×6 IMPLANT
HANDPIECE INTERPULSE COAX TIP (DISPOSABLE) ×3
HEAD SROM 36MM PLUS 12 IMPLANT
HOLDER FOLEY CATH W/STRAP (MISCELLANEOUS) ×3 IMPLANT
LINER NEUTRAL 62MMC36MM P4 (Liner) ×2 IMPLANT
PACK ANTERIOR HIP CUSTOM (KITS) ×3 IMPLANT
RETRACTOR WND ALEXIS 18 MED (MISCELLANEOUS) ×1 IMPLANT
RTRCTR WOUND ALEXIS 18CM MED (MISCELLANEOUS) ×3
SCREW 6.5MMX25MM (Screw) ×6 IMPLANT
SET HNDPC FAN SPRY TIP SCT (DISPOSABLE) ×1 IMPLANT
SPONGE LAP 18X18 X RAY DECT (DISPOSABLE) ×2 IMPLANT
SROM HEAD 36MM PLUS 12 ×3 IMPLANT
STRIP CLOSURE SKIN 1/2X4 (GAUZE/BANDAGES/DRESSINGS) ×2 IMPLANT
SUT ETHIBOND NAB CT1 #1 30IN (SUTURE) ×4 IMPLANT
SUT ETHILON 3 0 PS 1 (SUTURE) ×2 IMPLANT
SUT MNCRL AB 4-0 PS2 18 (SUTURE) ×3 IMPLANT
SUT VIC AB 0 CT1 36 (SUTURE) ×2 IMPLANT
SUT VIC AB 1 CT1 36 (SUTURE) ×9 IMPLANT
SUT VIC AB 2-0 CT1 27 (SUTURE) ×6
SUT VIC AB 2-0 CT1 TAPERPNT 27 (SUTURE) ×2 IMPLANT
SWAB COLLECTION DEVICE MRSA (MISCELLANEOUS) ×2 IMPLANT
SWAB CULTURE ESWAB REG 1ML (MISCELLANEOUS) ×3 IMPLANT
TRAY FOLEY W/METER SILVER 14FR (SET/KITS/TRAYS/PACK) ×1 IMPLANT
TRAY FOLEY W/METER SILVER 16FR (SET/KITS/TRAYS/PACK) ×3 IMPLANT
WATER STERILE IRR 1500ML POUR (IV SOLUTION) ×3 IMPLANT

## 2016-06-07 NOTE — Brief Op Note (Signed)
06/07/2016  3:46 PM  PATIENT:  [name]  70 y.o. male  PRE-OPERATIVE DIAGNOSIS:  left hip osteolysis  POST-OPERATIVE DIAGNOSIS:  left hip osteolysis  PROCEDURE:  Procedure(s): LEFT ANTERIOR HIP ACETABULAR REVISION (Left)  SURGEON:  Surgeon(s) and Role:    * Mcarthur Rossetti, MD - Primary  PHYSICIAN ASSISTANT: Benita Stabile, PA-C  ANESTHESIA:   spinal  EBL:  Total I/O In: 1000 [I.V.:1000] Out: 825 [Urine:125; Blood:700]  COUNTS:  YES  DICTATION: .Other Dictation: Dictation Number [id number]  PLAN OF CARE: Admit to inpatient   PATIENT DISPOSITION:  PACU - hemodynamically stable.   Delay start of Pharmacological VTE agent (>24hrs) due to surgical blood loss or risk of bleeding: no

## 2016-06-07 NOTE — H&P (Signed)
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 male, has a history of pain and functional disability in the left hip due to failed recalled acetabular component and patient has failed non-surgical conservative treatments for greater than 12 weeks to include NSAID's and/or analgesics. The indications for the revision total hip arthroplasty are loosening of one or more components, fracture or mechanical failure of one or more component, bearing surface wear leading to  metalosis and psuedotumor.  Onset of symptoms was abrupt starting 1 years ago with rapidlly worsening course since that time.  Prior procedures on the left hip include arthroplasty.  Patient currently rates pain in the left hip at 10 out of 10 with activity.  There is night pain, worsening of pain with activity and weight bearing, pain that interfers with activities of daily living, pain with passive range of motion and joint swelling. Patient has evidence of metalosis, fluid, and pseudotumor on MRI by imaging studies.  This condition presents safety issues increasing the risk of falls.  There is no current active infection.  Patient Active Problem List   Diagnosis Date Noted  . Encounter for surveillance of recalled total hip arthroplasty hardware ([place]) 06/07/2016  . 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 Red blood cell abnormality 02/11/2016  . Elevated blood pressure reading without diagnosis of hypertension 07/01/2013  . Peripheral neuropathy ([place]) 06/02/2013  . Health maintenance examination 11/26/2011  . ELEVATED PROSTATE SPECIFIC ANTIGEN 05/21/2010  . Obesity, Class I, BMI 30-34.9 05/18/2010  . VENOUS INSUFFICIENCY, CHRONIC 05/18/2010  . OSTEOARTHRITIS, KNEES, BILATERAL 05/18/2010   Past  Medical History:  Diagnosis Date  . Anterior dislocation of right shoulder 09/2013   Reduced in ED under sedation.  Dislocated posteriorly 2015, ? partial RC tear, has f/u planned with Dr. Marlou Sa--? surgery? possible plan for 2016.  [name] Blood donor, whole blood    Has donated approx 30 gallons in his lifetime  . Chronic renal insufficiency, stage II (mild)    CrCl in the 60s  followed by [facility]   . Chronic venous insufficiency    +compression hose  . DDD (degenerative disc disease), lumbar 2017   Dr. Noe Gens ortho.  He is now wearing a new leg brace.  Has had ESI and will get another.  . Foot drop, left    s/p hip fracture 1969  . Hx of adenomatous colonic polyps    polypectomy 2007; 2012; 2015 (+adenomatous, no high grade dysplasia); recall 5 yrs  . Osteoarthritis    knees and hips primarily  . 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  As of urol f/u 08/2014, plan is for me to follow PSAs and fax results to Dr. Junious Silk: PSA 02/2015 was 0.21 (stable).  Stable on f/u with Dr. Junious Silk 08/2015.  . Toe infection 05/27/2013   Left great toe and right 2nd toe  . Urge incontinence    vesicare helpful  . White coat hypertension    no rx?white coat syndrome    Past Surgical History:  Procedure Laterality Date  . arthroscopy left shoulder Left 07  . bilateral carpal tunnel release  2003  . BILATERAL CARPAL TUNNEL RELEASE    . COLONOSCOPY  2007;2012;2015   04/12/2014 Tubular adenoma x 1;  per Dr. Henrene Pastor recall 5 yrs  . FRACTURE SURGERY Left   . JOINT REPLACEMENT Left    hip  . LUMBAR LAMINECTOMY/DECOMPRESSION MICRODISCECTOMY Left 03/28/2016   Procedure: Left Lumbar three-four Laminoforaminotomy;  Surgeon: Erline Levine, MD;  Location: [facility];  Service: Neurosurgery;  Laterality: Left;  left  . RADIOACTIVE SEED IMPLANT  2012  . ROTATOR CUFF REPAIR  2006   right  . seed implant prostate  2012  .  SHOULDER ARTHROSCOPY Left   . TOTAL HIP ARTHROPLASTY  2008   left ([place])  . VASECTOMY      Prescriptions Prior to Admission  Medication Sig Dispense Refill Last Dose  . acetaminophen (TYLENOL) 325 MG tablet Take 650 mg by mouth every 6 (six) hours as needed for moderate pain.   Past Week at Unknown time  . aspirin 81 MG tablet Take 81 mg by mouth daily.     Past Week at Unknown time  . oxyCODONE (OXY IR/ROXICODONE) 5 MG immediate release tablet Take 5 mg by mouth 2 (two) times daily as needed for moderate pain. for pain  0   . solifenacin (VESICARE) 5 MG tablet Take 5 mg by mouth daily.   03/28/2016 at 0530  . traMADol (ULTRAM) 50 MG tablet Take 50 mg by mouth every 8 (eight) hours as needed for moderate pain.   0   . oxyCODONE-acetaminophen (ROXICET) 5-325 MG tablet Take 1-2 tablets by mouth every 4 (four) hours as needed for severe pain. (Patient not taking: Reported on 05/28/2016) 80 tablet 0 Not Taking at Unknown time  . tiZANidine (ZANAFLEX) 2 MG tablet Take 1-2 tablets (2-4 mg total) by mouth every 6 (six) hours as needed for muscle spasms. (Patient not taking: Reported on 05/28/2016) 60 tablet 0 Completed Course at Unknown time   No Known Allergies  Social History  Substance Use Topics  . Smoking status: Never Smoker  . Smokeless tobacco: Never Used  . Alcohol use No    Family History  Problem Relation Age of Onset  . Lung cancer Mother   . Heart disease Father       Review of Systems  Musculoskeletal: Positive for back pain and joint pain.  All other systems reviewed and are negative.   Objective:  Physical Exam  Constitutional: He is oriented to person, place, and time. He appears well-developed and well-nourished.  HENT:  Head: Normocephalic and atraumatic.  Eyes: EOM are normal. Pupils are equal, round, and reactive to light.  Neck: Normal range of motion. Neck supple.  Cardiovascular: Normal rate and regular rhythm.   Respiratory: Effort normal and breath  sounds normal.  GI: Soft. Bowel sounds are normal.  Musculoskeletal:       Left hip: He exhibits decreased range of motion, decreased strength, tenderness and bony tenderness.  Neurological: He is alert and oriented to person, place, and time.  Skin: Skin is warm and dry.  Psychiatric: He has a normal mood and affect.    Vital signs in last 24 hours: Weight:  [106.6 kg (235 lb)] 106.6 kg (235 lb) (09/01 1200)   Labs:   Estimated body mass index is 29.37 kg/m as calculated from the following:   Height as of this encounter: 6\' 3"  (1.905 m).   Weight as of this encounter: 106.6 kg (235 lb).  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  The risks and benefits have been discussed in great detail.

## 2016-06-07 NOTE — Transfer of Care (Signed)
Immediate Anesthesia Transfer of Care Note  Patient: [name]  Procedure(s) Performed: Procedure(s): LEFT ANTERIOR HIP ACETABULAR REVISION (Left)  Patient Location: PACU  Anesthesia Type:Spinal  Level of Consciousness: awake, alert  and oriented  Airway & Oxygen Therapy: Patient Spontanous Breathing and Patient connected to face mask oxygen  Post-op Assessment: Report given to RN and Post -op Vital signs reviewed and stable  Post vital signs: Reviewed and stable  Last Vitals: There were no vitals filed for this visit.  Last Pain: There were no vitals filed for this visit.       Complications: No apparent anesthesia complications

## 2016-06-07 NOTE — Anesthesia Preprocedure Evaluation (Signed)
Anesthesia Evaluation  Patient identified by MRN, date of birth, ID band Patient awake    Reviewed: Allergy & Precautions, H&P , Patient's Chart, lab work & pertinent test results, reviewed documented beta blocker date and time   Airway Mallampati: II  TM Distance: >3 FB Neck ROM: full    Dental no notable dental hx. (+) Teeth Intact, Dental Advisory Given   Pulmonary    Pulmonary exam normal breath sounds clear to auscultation       Cardiovascular hypertension, + Peripheral Vascular Disease   Rhythm:regular Rate:Normal     Neuro/Psych    GI/Hepatic   Endo/Other    Renal/GU Renal disease     Musculoskeletal  (+) Arthritis ,   Abdominal   Peds  Hematology   Anesthesia Other Findings   Reproductive/Obstetrics                             Anesthesia Physical  Anesthesia Plan  ASA: II  Anesthesia Plan:    Post-op Pain Management:    Induction:   Airway Management Planned:   Additional Equipment:   Intra-op Plan:   Post-operative Plan:   Informed Consent: I have reviewed the patients History and Physical, chart, labs and discussed the procedure including the risks, benefits and alternatives for the proposed anesthesia with the patient or authorized representative who has indicated his/her understanding and acceptance.   Dental advisory given  Plan Discussed with: CRNA  Anesthesia Plan Comments: ( )        Anesthesia Quick Evaluation

## 2016-06-07 NOTE — Anesthesia Procedure Notes (Signed)
Spinal  Patient location during procedure: OR Start time: 06/07/2016 1:39 PM End time: 06/07/2016 1:45 PM Staffing Anesthesiologist: Nolon Nations Resident/CRNA: Chrystine Oiler G Performed: resident/CRNA  Preanesthetic Checklist Completed: patient identified, site marked, surgical consent, pre-op evaluation, timeout performed, IV checked, risks and benefits discussed and monitors and equipment checked Spinal Block Patient position: sitting Prep: ChloraPrep Patient monitoring: heart rate, continuous pulse ox and blood pressure Approach: midline Location: L2-3 Injection technique: single-shot Needle Needle type: Pencan  Needle gauge: 24 G Needle length: 10 cm Needle insertion depth: 5 cm Assessment Sensory level: T6 Additional Notes Kit expiration date checked, patient tolerated procedure well.

## 2016-06-07 NOTE — Anesthesia Postprocedure Evaluation (Signed)
Anesthesia Post Note  Patient: [name]  Procedure(s) Performed: Procedure(s) (LRB): LEFT ANTERIOR HIP ACETABULAR REVISION (Left)  Patient location during evaluation: PACU Anesthesia Type: Spinal and MAC Level of consciousness: awake and alert Pain management: pain level controlled Vital Signs Assessment: post-procedure vital signs reviewed and stable Respiratory status: spontaneous breathing and respiratory function stable Cardiovascular status: blood pressure returned to baseline and stable Postop Assessment: spinal receding Anesthetic complications: no    Last Vitals:  Vitals:   06/07/16 2000 06/07/16 2111  BP: 121/65 (!) 117/59  Pulse: (!) 104 99  Resp: 16 16  Temp: (!) 38.8 C (!) 39.3 C    Last Pain:  Vitals:   06/07/16 2111  TempSrc: Oral  PainSc:                  Nolon Nations

## 2016-06-08 LAB — BASIC METABOLIC PANEL
Anion gap: 7 (ref 5–15)
BUN: 19 mg/dL (ref 6–20)
CHLORIDE: 102 mmol/L (ref 101–111)
CO2: 25 mmol/L (ref 22–32)
CREATININE: 1.5 mg/dL — AB (ref 0.61–1.24)
Calcium: 9.5 mg/dL (ref 8.9–10.3)
GFR calc Af Amer: 52 mL/min — ABNORMAL LOW (ref >60)
GFR calc non Af Amer: 45 mL/min — ABNORMAL LOW (ref >60)
GLUCOSE: 122 mg/dL — AB (ref 65–99)
Potassium: 4.8 mmol/L (ref 3.5–5.1)
SODIUM: 134 mmol/L — AB (ref 135–145)

## 2016-06-08 LAB — CBC
HCT: 32.3 % — ABNORMAL LOW (ref 39.0–52.0)
HEMOGLOBIN: 10 g/dL — AB (ref 13.0–17.0)
MCH: 24.9 pg — AB (ref 26.0–34.0)
MCHC: 31 g/dL (ref 30.0–36.0)
MCV: 80.3 fL (ref 78.0–100.0)
Platelets: 298 10*3/uL (ref 150–400)
RBC: 4.02 MIL/uL — ABNORMAL LOW (ref 4.22–5.81)
RDW: 17.4 % — ABNORMAL HIGH (ref 11.5–15.5)
WBC: 11.8 10*3/uL — ABNORMAL HIGH (ref 4.0–10.5)

## 2016-06-08 NOTE — Progress Notes (Addendum)
Physical Therapy Treatment Patient Details Name: RAMIZ SPANNAGEL MRN: [id number] DOB: Dec 12, 1945 Today's Date: 06/08/2016    History of Present Illness Pt s/p L hip acetabular revision.  Pt with hx of DDD, L THR (08), Lumbar lami (6/17) and long-term foot drop on L     PT Comments    Pt motivated and progressing well with mobility.  Pt hopeful for dc home tomorrow.  Follow Up Recommendations  Home health PT     Equipment Recommendations  None recommended by PT    Recommendations for Other Services OT consult     Precautions / Restrictions Precautions Precautions: Fall Restrictions Weight Bearing Restrictions: No Other Position/Activity Restrictions: WBAT    Mobility  Bed Mobility Overal bed mobility: Needs Assistance Bed Mobility: Sit to Supine       Sit to supine: Min assist   General bed mobility comments: cues for sequence and use of R LE to self assist.  Physical assist to manage L LE  Transfers Overall transfer level: Needs assistance Equipment used: Rolling walker (2 wheeled) Transfers: Sit to/from Stand Sit to Stand: Min guard Stand pivot transfers: Min assist       General transfer comment: cues for LE management and use of UEs to self assist  Ambulation/Gait Ambulation/Gait assistance: Min guard Ambulation Distance (Feet): 200 Feet Assistive device: Rolling walker (2 wheeled) Gait Pattern/deviations: Step-to pattern;Step-through pattern;Shuffle;Trunk flexed: Decreased dorsiflexion on L Gait velocity: decr Gait velocity interpretation: Below normal speed for age/gender General Gait Details: cues for posture, position from RW and initial sequence   Stairs            Wheelchair Mobility    Modified Rankin (Stroke Patients Only)       Balance                                    Cognition Arousal/Alertness: Awake/alert Behavior During Therapy: WFL for tasks assessed/performed Overall Cognitive Status: Within Functional  Limits for tasks assessed                      Exercises      General Comments        Pertinent Vitals/Pain Pain Assessment: 0-10 Pain Score: 5  Pain Location: L hip Pain Descriptors / Indicators: Aching;Sore Pain Intervention(s): Limited activity within patient's tolerance;Monitored during session;Patient requesting pain meds-RN notified;RN gave pain meds during session (Pt declined pain meds prior to session and ice after)    Home Living Family/patient expects to be discharged to:: Private residence Living Arrangements: Spouse/significant other Available Help at Discharge: Family Type of Home: House Home Access: Ramped entrance   Home Layout: Two level Home Equipment: Environmental consultant - 2 wheels;Cane - single point;Bedside commode      Prior Function Level of Independence: Needs assistance  Gait / Transfers Assistance Needed: RW and very ltd 2* pain ADL's / Homemaking Assistance Needed: spouse     PT Goals (current goals can now be found in the care plan section) Acute Rehab PT Goals Patient Stated Goal: Regain IND PT Goal Formulation: With patient Time For Goal Achievement: 06/11/16 Potential to Achieve Goals: Good Progress towards PT goals: Progressing toward goals    Frequency  7X/week    PT Plan Current plan remains appropriate    Co-evaluation             End of Session Equipment Utilized During Treatment: Gait belt Activity Tolerance:  Patient tolerated treatment well Patient left: in bed;with call bell/phone within reach;with family/visitor present;with nursing/sitter in room     Time: 1421-1441 PT Time Calculation (min) (ACUTE ONLY): 20 min  Charges:  $Gait Training: 8-22 mins                    G Codes:      Riya Huxford 06-22-2016, 5:00 PM

## 2016-06-08 NOTE — Progress Notes (Signed)
Subjective: 1 Day Post-Op Procedure(s) (LRB): LEFT ANTERIOR HIP ACETABULAR REVISION (Left) Patient reports pain as moderate.  Acute blood loss anemia from surgery - will monitor.  Vitals stable.  Objective: Vital signs in last 24 hours: Temp:  [96.3 F (35.7 C)-102.8 F (39.3 C)] 96.3 F (35.7 C) (09/02 0619) Pulse Rate:  [64-104] 66 (09/02 0619) Resp:  [15-22] 16 (09/02 0619) BP: (96-128)/(56-67) 100/60 (09/02 0619) SpO2:  [95 %-100 %] 98 % (09/02 0619) Weight:  [106.6 kg (235 lb)] 106.6 kg (235 lb) (09/01 1200)  Intake/Output from previous day: 09/01 0701 - 09/02 0700 In: 3970 [P.O.:820; I.V.:2940; IV Piggyback:210] Out: 1450 [Urine:750; Blood:700] Intake/Output this shift: Total I/O In: 360 [P.O.:360] Out: -    Recent Labs  06/08/16 0426  HGB 10.0*    Recent Labs  06/08/16 0426  WBC 11.8*  RBC 4.02*  HCT 32.3*  PLT 298    Recent Labs  06/08/16 0426  NA 134*  K 4.8  CL 102  CO2 25  BUN 19  CREATININE 1.50*  GLUCOSE 122*  CALCIUM 9.5   No results for input(s): LABPT, INR in the last 72 hours.  Sensation intact distally Intact pulses distally Dorsiflexion/Plantar flexion intact Incision: dressing C/D/I  Assessment/Plan: 1 Day Post-Op Procedure(s) (LRB): LEFT ANTERIOR HIP ACETABULAR REVISION (Left) Up with therapy - WBAT left hip  Mcarthur Rossetti 06/08/2016, 9:56 AM

## 2016-06-08 NOTE — Op Note (Signed)
[name], [name] NO.:  [id number]  MEDICAL RECORD NO.:  [id number]  LOCATION:  [place]                         FACILITY:  [facility]  PHYSICIAN:  [name], M.D.DATE OF BIRTH:  1946/09/04  DATE OF PROCEDURE:  06/07/2016 DATE OF DISCHARGE:                              OPERATIVE REPORT   PREOPERATIVE DIAGNOSIS:  Failed left hip acetabular component with DePuy ASR recall component and questionable pseudotumor and metallosis.  POSTOPERATIVE DIAGNOSIS:  Failed left hip acetabular component with DePuy ASR recall component and questionable pseudotumor and metallosis.  PROCEDURE:  Revision of left hip acetabular component.  FINDINGS:  Completely failed left hip acetabular component with large joint effusion.  No evidence of infection or pseudotumor.  IMPLANTS:  DePuy Sector Gription acetabular component size 62 with 3 screws and a size 36+ 4 polyethylene liner.  SURGEON:  [name], M.D.  ASSISTANT:  Joseph Emery, PA-C.  ANESTHESIA:  Spinal.  ANTIBIOTICS:  900 mg of clindamycin.  BLOOD LOSS:  700 mL.  COMPLICATIONS:  None.  INDICATIONS:  [name] is a very pleasant 70 year old gentleman with debilitating left hip pain.  This has been going on for only a couple of months now.  He has an interesting history and the fact that he had left total hip arthroplasty performed through a posterior approach 9 years ago.  The acetabular component was DePuy metal on metal ASR component that has been recalled.  He had actually done very well until a few months ago.  He started developing worsening left hip pain.  An MRI was obtained, which showed a large fluid collection, evidence of metallosis, and findings consistent with possible pseudotumor.  He then developed worsening severe hip pain and he was referred to me for an acetabular revision.  I talked to him having his done through a direct anterior approach.  The risks and benefits of the  surgery were explained to him in detail and he did wish to proceed.  PROCEDURE DESCRIPTION:  After informed consent was obtained, appropriate left hip was marked.  He was brought to the operating room where spinal anesthesia was obtained while he was on a stretcher.  A Foley catheter was placed and he was placed in a supine position on the HANA fracture table with the perineal post in place and both legs in inline skeletal traction boots, but no traction applied.  His left operative hip was then prepped and draped with DuraPrep and sterile drapes.  Time-out was called.  He was identified as correct patient, correct left hip.  We did assess his hip radiographically before starting and we found it between his last films that we obtained in early August and then the films today that the acetabular component failed significantly and the hip had almost essentially dislocated.  After prepping with DuraPrep and sterile drapes, a time-out was called and he was identified as correct patient and correct left hip.  We then made an incision inferior and posterior to the anterior superior iliac spine and carried this obliquely down the leg.  We dissected down the tensor fascia lata muscle.  The tensor fascia was then divided longitudinally to proceed with a direct anterior approach  to the hip.  We identified and cauterized the circumflex vessels and then identified the hip capsule.  We placed Cobra retractors in the hip capsule and encountered incredibly large joint effusion of serosanguineous fluid, this was thin-appearing fluid, but we still did send a stat Gram stain and culture, which came back no organisms, but some white blood cells.  We then irrigated the hip with 1 L of normal saline solution.  We then were able to easily expose the hip socket and found it to have completely failed.  We were able to remove the hip ball separately and then the hip socket without any difficulty at all.   With the leg externally rotated, we were then able to assess the complete acetabulum.  I used a curette to clean out any fibrous tissue from all around the acetabular rim and then irrigated all of the hip in this area with another L of normal saline solution.  We then began reaming under fluoroscopy and direct vision placing a few reamers so we could assess the quality of bone.  He had originally a 60 acetabular component, so we were able to just go up to 62, we had to medialize it a little bit more, but then we put the real 60 Sector Gription acetabular component under direct visualization and fluoroscopy.  We got a good fit with this. There was overhang, I still elected to place 3 separate screws.  Once I felt like we had a good fit with this, we placed the real 36+ 4 polyethylene liner for that size 62 acetabular component.  We then externally rotated the femur, extended, and adducted.  We were able to trial hip balls, we trialed first a +9 based on the SROM femoral component, it was well seated and decided to go with a real +12.  We placed a +12 in place, and we then were able to bring the leg back over and up with traction and internal rotation reducing the pelvis.  We were pleased with leg length, offset, and stability.  We then irrigated the soft tissues fully with another 3 L of normal saline solution.  I was able to close the joint capsule tissue with interrupted #1 Ethibond suture followed by running #1 Vicryl in the tensor fascia, 0-Vicryl deep tissue, 2-0 Vicryl in the subcutaneous tissue, 4-0 Monocryl subcuticular stitch, and Steri-Strips on the skin.  An Aquacel dressing was applied. He was taken off the HANA table, and taken to the recovery room in stable condition.  All final counts were correct.  There were no complications noted.  Of note, Joseph Emery, PA-C assisted in the entire case.  His assistance was crucial for facilitating all aspects of this  case.     [name], M.D.     CYB/MEDQ  D:  06/07/2016  T:  06/08/2016  Job:  [id number]

## 2016-06-08 NOTE — Clinical Social Work Note (Signed)
CSW order received this morning to assist patient with SNF placement.  PT recommendation indicates home with home health.  Resides at home with his wife.  CSW services will sign off but available if needed.  Thanks!  Lorie Phenix. Pauline Good, [place]

## 2016-06-08 NOTE — Evaluation (Signed)
Occupational Therapy Evaluation Patient Details Name: [name] MRN: [id number] DOB: 1946/09/11 Today's Date: 06/08/2016    History of Present Illness Pt s/p L hip acetabular revision.  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  No further OT needed    Follow Up Recommendations  No OT follow up    Equipment Recommendations  Other (comment) (pt has all needed DME)    Recommendations for Other Services       Precautions / Restrictions Precautions Precautions: Fall Restrictions Weight Bearing Restrictions: No Other Position/Activity Restrictions: WBAT      Mobility Bed Mobility               General bed mobility comments: pt in chair  Transfers Overall transfer level: Needs assistance Equipment used: Rolling walker (2 wheeled) Transfers: Sit to/from [facility] Sit to Stand: Min assist Stand pivot transfers: Min assist                 ADL Overall ADL's : Needs assistance/impaired     Grooming: Set up;Sitting   Upper Body Bathing: Set up;Sitting       Upper Body Dressing : Set up;Sitting   Lower Body Dressing: Moderate assistance;Sit to/from stand;Cueing for safety;With adaptive equipment;With caregiver independent assisting;Cueing for sequencing   Toilet Transfer: Min guard;Comfort height toilet;Ambulation;RW   Toileting- Clothing Manipulation and Hygiene: Minimal assistance;Sit to/from stand;Cueing for safety                         Pertinent Vitals/Pain Pain Score: 2  Pain Location: L hip Pain Descriptors / Indicators: Sore Pain Intervention(s): Limited activity within patient's tolerance     Hand Dominance     Extremity/Trunk Assessment Upper Extremity Assessment Upper Extremity Assessment: Overall WFL for tasks assessed           Communication Communication Communication: No difficulties   Cognition  Arousal/Alertness: Awake/alert Behavior During Therapy: WFL for tasks assessed/performed Overall Cognitive Status: Within Functional Limits for tasks assessed                     General Comments   OT obtained 3 n 1 to go over pts toilet in room to increase I with toilet transfer            Home Living Family/patient expects to be discharged to:: Private residence Living Arrangements: Spouse/significant other Available Help at Discharge: Family Type of Home: House Home Access: Ramped entrance     Home Layout: Two level Alternate Level Stairs-Number of Steps: 5 Alternate Level Stairs-Rails: Right           Home Equipment: Walker - 2 wheels;Cane - single point;Bedside commode          Prior Functioning/Environment Level of Independence: Needs assistance  Gait / Transfers Assistance Needed: RW and very ltd 2* pain ADL's / Homemaking Assistance Needed: spouse                 OT Goals(Current goals can be found in the care plan section) Acute Rehab OT Goals Patient Stated Goal: Regain IND  OT Frequency:      End of Session Nurse Communication: Mobility status  Activity Tolerance: Patient tolerated treatment well Patient left: in chair;with call bell/phone within reach;with family/visitor present   Time: [id number] OT Time Calculation (min): 17 min Charges:  OT General Charges $OT Visit: 1 Procedure OT Evaluation $OT Eval Moderate Complexity: 1 Procedure G-Codes:    Betsy Pries 2016-06-15, 1:47 PM

## 2016-06-08 NOTE — Evaluation (Signed)
Physical Therapy Evaluation Patient Details Name: [name] MRN: [id number] DOB: 1946/06/23 Today's Date: 06/08/2016   History of Present Illness  Pt s/p L hip acetabular revision.  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  Pt should progress to dc home with family assist and HHPT follow up.    Follow Up Recommendations Home health PT    Equipment Recommendations  None recommended by PT    Recommendations for Other Services OT consult     Precautions / Restrictions Precautions Precautions: Fall Restrictions Weight Bearing Restrictions: No Other Position/Activity Restrictions: WBAT      Mobility  Bed Mobility Overal bed mobility: Needs Assistance Bed Mobility: Supine to Sit     Supine to sit: Min assist     General bed mobility comments: cues for sequence and use of R LE to self assist  Transfers Overall transfer level: Needs assistance Equipment used: Rolling walker (2 wheeled) Transfers: Sit to/from Stand Sit to Stand: Min assist         General transfer comment: cues for LE management and use of UEs to self assist  Ambulation/Gait Ambulation/Gait assistance: Min assist;Min guard Ambulation Distance (Feet): 200 Feet Assistive device: Rolling walker (2 wheeled) Gait Pattern/deviations: Step-to pattern;Step-through pattern;Decreased step length - left;Shuffle;Trunk flexed Gait velocity: decr Gait velocity interpretation: Below normal speed for age/gender General Gait Details: cues for posture, position from RW and initial sequence  Stairs            Wheelchair Mobility    Modified Rankin (Stroke Patients Only)       Balance                                             Pertinent Vitals/Pain Pain Assessment: 0-10 Pain Score: 2  Pain Location: L  hip Pain Descriptors / Indicators: Aching;Sore Pain Intervention(s): Limited activity within patient's tolerance;Monitored during session;Premedicated before session;Ice applied    Home Living Family/patient expects to be discharged to:: Private residence Living Arrangements: Spouse/significant other Available Help at Discharge: Family Type of Home: House Home Access: Ramped entrance     Home Layout: Two level Home Equipment: Environmental consultant - 2 wheels;Cane - single point;Bedside commode      Prior Function Level of Independence: Needs assistance   Gait / Transfers Assistance Needed: RW and very ltd 2* pain  ADL's / Homemaking Assistance Needed: spouse        Hand Dominance        Extremity/Trunk Assessment   Upper Extremity Assessment: Overall WFL for tasks assessed           Lower Extremity Assessment: LLE deficits/detail   LLE Deficits / Details: Strength at hip 2+/5 with AAROM at hip to 80 flex and 20 abd  Cervical / Trunk Assessment: Normal  Communication   Communication: No difficulties  Cognition Arousal/Alertness: Awake/alert Behavior During Therapy: WFL for tasks assessed/performed Overall Cognitive Status: Within Functional Limits for tasks assessed                      General Comments      Exercises Total Joint Exercises Ankle Circles/Pumps: AROM;Both;15 reps;Supine Quad Sets: AROM;Both;10 reps;Supine Heel Slides: AAROM;Left;20 reps;Supine Hip ABduction/ADduction:  AAROM;Left;15 reps;Supine      Assessment/Plan    PT Assessment Patient needs continued PT services  PT Diagnosis Difficulty walking   PT Problem List Decreased strength;Decreased range of motion;Decreased activity tolerance;Decreased mobility;Decreased knowledge of use of DME;Pain  PT Treatment Interventions DME instruction;Gait training;Stair training;Functional mobility training;Therapeutic activities;Therapeutic exercise;Patient/family education   PT Goals (Current goals  can be found in the Care Plan section) Acute Rehab PT Goals Patient Stated Goal: Regain IND PT Goal Formulation: With patient Time For Goal Achievement: 06/11/16 Potential to Achieve Goals: Good    Frequency 7X/week   Barriers to discharge        Co-evaluation               End of Session Equipment Utilized During Treatment: Gait belt Activity Tolerance: Patient tolerated treatment well Patient left: in chair;with call bell/phone within reach;with family/visitor present Nurse Communication: Mobility status         Time: [id number] PT Time Calculation (min) (ACUTE ONLY): 32 min   Charges:   PT Evaluation $PT Eval Low Complexity: 1 Procedure PT Treatments $Therapeutic Exercise: 8-22 mins   PT G Codes:        Alanee Ting 2016/06/26, 12:23 PM

## 2016-06-09 LAB — CBC
HEMATOCRIT: 27.4 % — AB (ref 39.0–52.0)
Hemoglobin: 8.7 g/dL — ABNORMAL LOW (ref 13.0–17.0)
MCH: 24.5 pg — AB (ref 26.0–34.0)
MCHC: 31.8 g/dL (ref 30.0–36.0)
MCV: 77.2 fL — AB (ref 78.0–100.0)
PLATELETS: 324 10*3/uL (ref 150–400)
RBC: 3.55 MIL/uL — ABNORMAL LOW (ref 4.22–5.81)
RDW: 16.9 % — AB (ref 11.5–15.5)
WBC: 12.6 10*3/uL — AB (ref 4.0–10.5)

## 2016-06-09 MED ORDER — SODIUM CHLORIDE 0.9 % IV BOLUS (SEPSIS)
500.0000 mL | Freq: Once | INTRAVENOUS | Status: AC
  Administered 2016-06-09: 500 mL via INTRAVENOUS

## 2016-06-09 MED ORDER — ASPIRIN 325 MG PO TBEC: 325.0000 mg | DELAYED_RELEASE_TABLET | Freq: Every day | ORAL | 0 refills | Status: DC

## 2016-06-09 MED ORDER — OXYCODONE-ACETAMINOPHEN 5-325 MG PO TABS: 1.0000 | ORAL_TABLET | ORAL | 0 refills | Status: DC | PRN

## 2016-06-09 MED ORDER — CEFAZOLIN IN D5W 1 GM/50ML IV SOLN
1.0000 g | Freq: Three times a day (TID) | INTRAVENOUS | Status: DC
  Administered 2016-06-09 – 2016-06-11 (×6): 1 g via INTRAVENOUS
  Filled 2016-06-09 (×7): qty 50

## 2016-06-09 NOTE — Progress Notes (Signed)
Physical Therapy Treatment Patient Details Name: CYLUS CARPENITO MRN: [id number] DOB: 09/07/1946 Today's Date: 06/09/2016    History of Present Illness Pt s/p L hip acetabular revision.  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  Assisted with amb in hallway while instructing spouse on safe handling.  Practiced stairs with pt and spouse then returned to room to perform THR TE's folowing HEP handout.  Instructed on proper tech, freq as well as use of ICE.    Follow Up Recommendations  Home health PT     Equipment Recommendations  None recommended by PT    Recommendations for Other Services       Precautions / Restrictions Precautions Precautions: Fall Restrictions Weight Bearing Restrictions: No Other Position/Activity Restrictions: WBAT    Mobility  Bed Mobility    demonstrated and instructed pt how to use a belt to self assist L LE OOB           General bed mobility comments: Pt OOB in recliner  Transfers Overall transfer level: Needs assistance Equipment used: Rolling walker (2 wheeled) Transfers: Sit to/from Stand Sit to Stand: Min guard;Supervision         General transfer comment: 25% VC's on proper hand placement and increased time  Ambulation/Gait Ambulation/Gait assistance: Supervision;Min guard Ambulation Distance (Feet): 184 Feet Assistive device: Rolling walker (2 wheeled) Gait Pattern/deviations: Step-to pattern;Step-through pattern;Trunk flexed Gait velocity: decr   General Gait Details: cues for posture, position from RW and initial sequence   Stairs Stairs: Yes Stairs assistance: Min assist Stair Management: One rail Left;Step to pattern;Forwards;With cane Number of Stairs: 4 General stair comments: with spouse and 50% VC;s on proper tech and safety as well as safe handling tech  Wheelchair  Mobility    Modified Rankin (Stroke Patients Only)       Balance                                    Cognition Arousal/Alertness: Awake/alert Behavior During Therapy: WFL for tasks assessed/performed Overall Cognitive Status: Within Functional Limits for tasks assessed                      Exercises   Total Hip Replacement TE's 10 reps ankle pumps assisted with strap L foot (foot drop) 10 reps knee presses 10 reps heel slides assisted with strap 10 reps SAQ's 10 reps ABD assisted with strap Followed by ICE     General Comments        Pertinent Vitals/Pain Pain Assessment: 0-10 Pain Score: 3  Pain Location: L hip Pain Descriptors / Indicators: Aching;Tightness;Sore Pain Intervention(s): Monitored during session;Repositioned;Ice applied    Home Living                      Prior Function            PT Goals (current goals can now be found in the care plan section) Progress towards PT goals: Progressing toward goals    Frequency  7X/week    PT Plan Current plan remains appropriate    Co-evaluation             End of Session Equipment Utilized During Treatment: Gait  belt Activity Tolerance: Patient tolerated treatment well Patient left: in chair;with call bell/phone within reach;with family/visitor present     Time: [id number] PT Time Calculation (min) (ACUTE ONLY): 40 min  Charges:  $Gait Training: 8-22 mins $Therapeutic Exercise: 8-22 mins $Therapeutic Activity: 8-22 mins                    G Codes:      Rica Koyanagi  PTA [facility]  Acute  Rehab Pager      [phone]

## 2016-06-09 NOTE — Significant Event (Signed)
Rapid Response Event Note  Overview: Time Called: 2210 Arrival Time: 2215 Event Type: Other (Comment) (fever, chills)  Initial Focused Assessment: Called to assess patient with chills and MEWs =5. Pt had received pain med and was drowsy but able to answer orientation questions. He was delayed with responses. Monitor shows ST 105-115, BP stable, sats >95 on room air. Temp. 103.1 oral. Pt was started on antibiotics today.    Interventions:Monitored, tylenol and MD notified of events, NS bolus started.  Plan of Care (if not transferred): NS bolus, monitor temp. Labs for AM.  Event Summary: Name of Physician Notified: Dr. Ninfa Linden at 2240    at    Outcome: Stayed in room and stabalized  Event End Time: 2245  Pricilla Riffle

## 2016-06-09 NOTE — Progress Notes (Signed)
Spoke with floor RN regarding PICC.  Plan to do Monday in am.

## 2016-06-09 NOTE — Discharge Summary (Signed)
Patient ID: [name] MRN: [id number] DOB/AGE: 01/14/1946 70 y.o.  Admit date: 06/07/2016 Discharge date: 06/09/2016  Admission Diagnoses:  Principal Problem:   Encounter for surveillance of recalled total hip arthroplasty hardware [facility]) Active Problems:   Status post revision of total hip replacement   Discharge Diagnoses:  Same  Past Medical History:  Diagnosis Date  . Anterior dislocation of right shoulder 09/2013   Reduced in ED under sedation.  Dislocated posteriorly 2015, ? partial RC tear, has f/u planned with Dr. Marlou Sa--? surgery? possible plan for 2016.  [name] Blood donor, whole blood    Has donated approx 30 gallons in his lifetime  . Chronic renal insufficiency, stage II (mild)    CrCl in the 60s  followed by [facility]   . Chronic venous insufficiency    +compression hose  . DDD (degenerative disc disease), lumbar 2017   Dr. Noe Gens ortho.  He is now wearing a new leg brace.  Has had ESI and will get another.  . Foot drop, left    s/p hip fracture 1969  . Hx of adenomatous colonic polyps    polypectomy 2007; 2012; 2015 (+adenomatous, no high grade dysplasia); recall 5 yrs  . Osteoarthritis    knees and hips primarily  . 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  As of urol f/u 08/2014, plan is for me to follow PSAs and fax results to Dr. Junious Silk: PSA 02/2015 was 0.21 (stable).  Stable on f/u with Dr. Junious Silk 08/2015.  . Toe infection 05/27/2013   Left great toe and right 2nd toe  . Urge incontinence    vesicare helpful    Surgeries: Procedure(s): LEFT ANTERIOR HIP ACETABULAR REVISION on 06/07/2016   Consultants: None  Discharged Condition: Improved  Hospital Course: [name] is an 70 y.o. male who was admitted 06/07/2016 for operative treatment ofEncounter for surveillance of recalled total hip arthroplasty hardware ([place]). Patient has severe unremitting pain that  affects sleep, daily activities, and work/hobbies. After pre-op clearance the patient was taken to the operating room on 06/07/2016 and underwent  Procedure(s): LEFT ANTERIOR HIP ACETABULAR REVISION.    Patient was given perioperative antibiotics: Anti-infectives    Start     Dose/Rate Route Frequency Ordered Stop   06/07/16 2000  ceFAZolin (ANCEF) IVPB 1 g/50 mL premix     1 g 100 mL/hr over 30 Minutes Intravenous Every 6 hours 06/07/16 1808 06/08/16 0224   06/07/16 1159  ceFAZolin (ANCEF) IVPB 2g/100 mL premix     2 g 200 mL/hr over 30 Minutes Intravenous On call to O.R. 06/07/16 1159 06/07/16 1353       Patient was given sequential compression devices, early ambulation, and chemoprophylaxis to prevent DVT.  Patient benefited maximally from hospital stay and there were no complications.    Recent vital signs: Patient Vitals for the past 24 hrs:  BP Temp Temp src Pulse Resp SpO2  06/09/16 0506 121/65 98.8 F (37.1 C) Oral 84 18 95 %  06/08/16 2140 116/62 (!) 101.9 F (38.8 C) Oral 94 18 96 %  06/08/16 1515 140/62 98.1 F (36.7 C) Oral 76 16 98 %  06/08/16 1040 132/62 98 F (36.7 C) Oral 74 15 100 %     Recent laboratory studies:  Recent Labs  06/08/16 0426 06/09/16 0455  WBC 11.8* 12.6*  HGB 10.0* 8.7*  HCT 32.3* 27.4*  PLT 298 324  NA 134*  --  K 4.8  --   CL 102  --   CO2 25  --   BUN 19  --   CREATININE 1.50*  --   GLUCOSE 122*  --   CALCIUM 9.5  --      Discharge Medications:     Medication List    STOP taking these medications   acetaminophen 325 MG tablet Commonly known as:  TYLENOL   aspirin 81 MG tablet Replaced by:  aspirin 325 MG EC tablet   oxyCODONE 5 MG immediate release tablet Commonly known as:  Oxy IR/ROXICODONE     TAKE these medications   aspirin 325 MG EC tablet Take 1 tablet (325 mg total) by mouth daily. Replaces:  aspirin 81 MG tablet   oxyCODONE-acetaminophen 5-325 MG tablet Commonly known as:  ROXICET Take 1-2 tablets by  mouth every 4 (four) hours as needed for severe pain.   solifenacin 5 MG tablet Commonly known as:  VESICARE Take 5 mg by mouth daily.   tiZANidine 2 MG tablet Commonly known as:  ZANAFLEX Take 1-2 tablets (2-4 mg total) by mouth every 6 (six) hours as needed for muscle spasms.   traMADol 50 MG tablet Commonly known as:  ULTRAM Take 50 mg by mouth every 8 (eight) hours as needed for moderate pain.       Diagnostic Studies: Dg C-arm 61-120 Min-no Report  Result Date: 06/07/2016 CLINICAL DATA: left anterior hip revision C-ARM 61-120 MINUTES Fluoroscopy was utilized by the requesting physician.  No radiographic interpretation.   Dg Hip Port Unilat With Pelvis 1v Left  Result Date: 06/07/2016 CLINICAL DATA:  Revision of left hip arthroplasty EXAM: DG HIP (WITH OR WITHOUT PELVIS) 1V PORT LEFT COMPARISON:  05/09/2016 FINDINGS: Left total hip arthroplasty changes identified. No complicating features identified. Prostate seeds again noted. IMPRESSION: Left total hip arthroplasty without complicating features. Electronically Signed   By: Margarette Canada M.D.   On: 06/07/2016 17:12   Dg Hip Operative Unilat With Pelvis Left  Result Date: 06/07/2016 CLINICAL DATA:  Left acetabular revision EXAM: OPERATIVE LEFT HIP (WITH PELVIS IF PERFORMED) 3 VIEWS TECHNIQUE: Fluoroscopic spot image(s) were submitted for interpretation post-operatively. COMPARISON:  05/09/2016 left hip radiographs FINDINGS: Fluoroscopy time 0.9 minutes. Three nondiagnostic spot fluoroscopic intraoperative left hip radiographs demonstrate postsurgical changes from completion of left total hip arthroplasty with left acetabular prosthetic placement. Brachytherapy seeds overlie the prostate. IMPRESSION: Intraoperative fluoroscopic guidance for left acetabular revision. Electronically Signed   By: Ilona Sorrel M.D.   On: 06/07/2016 16:07    Disposition: 01-Home or Self Care       Signed: Erskine Emery 06/09/2016, 9:48 AM

## 2016-06-09 NOTE — Progress Notes (Signed)
Subjective: 2 Days Post-Op Procedure(s) (LRB): LEFT ANTERIOR HIP ACETABULAR REVISION (Left) Patient reports pain as mild.    Objective: Vital signs in last 24 hours: Temp:  [98 F (36.7 C)-101.9 F (38.8 C)] 98.8 F (37.1 C) (09/03 0506) Pulse Rate:  [74-94] 84 (09/03 0506) Resp:  [15-18] 18 (09/03 0506) BP: (116-140)/(62-65) 121/65 (09/03 0506) SpO2:  [95 %-100 %] 95 % (09/03 0506)  Intake/Output from previous day: 09/02 0701 - 09/03 0700 In: 1200 [P.O.:1200] Out: 900 [Urine:900] Intake/Output this shift: Total I/O In: 120 [P.O.:120] Out: -    Recent Labs  06/08/16 0426 06/09/16 0455  HGB 10.0* 8.7*    Recent Labs  06/08/16 0426 06/09/16 0455  WBC 11.8* 12.6*  RBC 4.02* 3.55*  HCT 32.3* 27.4*  PLT 298 324    Recent Labs  06/08/16 0426  NA 134*  K 4.8  CL 102  CO2 25  BUN 19  CREATININE 1.50*  GLUCOSE 122*  CALCIUM 9.5   No results for input(s): LABPT, INR in the last 72 hours.  Incision: dressing C/D/I Compartment soft  Assessment/Plan: 2 Days Post-Op Procedure(s) (LRB): LEFT ANTERIOR HIP ACETABULAR REVISION (Left) Up with therapy Discharge home with home health this afternoon.   [name] 06/09/2016, 9:42 AM

## 2016-06-09 NOTE — Discharge Instructions (Signed)

## 2016-06-09 NOTE — Progress Notes (Signed)
Physical Therapy Treatment Patient Details Name: [name] MRN: [id number] DOB: [birth year] Today's Date: 06/09/2016    History of Present Illness Pt s/p L hip acetabular revision.  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   Had pt use a belt to self assist L LE up onto bed which pt liked.  Positioned to comfort.    Follow Up Recommendations  Home health PT     Equipment Recommendations  None recommended by PT    Recommendations for Other Services       Precautions / Restrictions Precautions Precautions: Fall Restrictions Weight Bearing Restrictions: No Other Position/Activity Restrictions: WBAT    Mobility  Bed Mobility Overal bed mobility: Needs Assistance Bed Mobility: Sit to Supine       Sit to supine: Min guard;Supervision   General bed mobility comments: instructed pt to use a belt to self assist l LE back onto bed.  Transfers Overall transfer level: Needs assistance Equipment used: Rolling walker (2 wheeled) Transfers: Sit to/from Stand Sit to Stand: Min guard;Supervision Stand pivot transfers: Supervision;Min guard       General transfer comment: 25% VC's on proper hand placement and increased time  Ambulation/Gait Ambulation/Gait assistance: Min guard;Supervision Ambulation Distance (Feet): 125 Feet Assistive device: Rolling walker (2 wheeled) Gait Pattern/deviations: Step-to pattern;Step-through pattern Gait velocity: decr   General Gait Details: cues for posture, position from RW and initial sequence   Stairs Stairs: Yes Stairs assistance: Min assist Stair Management: One rail Left;Step to pattern;Forwards;With cane Number of Stairs: 4 General stair comments: with spouse and 50% VC;s on proper tech and safety as well as safe handling tech  Wheelchair Mobility    Modified Rankin (Stroke Patients  Only)       Balance                                    Cognition Arousal/Alertness: Awake/alert Behavior During Therapy: WFL for tasks assessed/performed Overall Cognitive Status: Within Functional Limits for tasks assessed                      Exercises      General Comments        Pertinent Vitals/Pain Pain Assessment: 0-10 Pain Score: 3  Pain Location: L hip Pain Descriptors / Indicators: Aching;Tightness Pain Intervention(s): Monitored during session;Repositioned;Ice applied    Home Living                      Prior Function            PT Goals (current goals can now be found in the care plan section) Progress towards PT goals: Progressing toward goals    Frequency  7X/week    PT Plan Current plan remains appropriate    Co-evaluation             End of Session Equipment Utilized During Treatment: Gait belt Activity Tolerance: Patient tolerated treatment well Patient left: in bed;with family/visitor present     Time: [id number] PT Time Calculation (min) (ACUTE ONLY): 40 min  Charges:  $Gait Training: 8-22 mins $Therapeutic Activity: 8-22 mins  G Codes:      Rica Koyanagi  PTA [facility]  Acute  Rehab Pager      [phone]

## 2016-06-09 NOTE — Progress Notes (Signed)
Patient ID: [name], male   DOB: 04-16-1946, 70 y.o.   MRN: [id number] Will hold on his discharge today.  The intra-operative gram stain showed no organisms, but the culture has now grown out staph aureus.  This may just be a contaminent, but this needs to be treated aggressively.  Nothing looked worrisome at the time of surgery.  I spoke to him in length about this.  Will have PICC line inserted and start Ancef IV for now until final culture sensitivities back.  He and his wife understand this.

## 2016-06-10 LAB — AEROBIC CULTURE  (SUPERFICIAL SPECIMEN)

## 2016-06-10 LAB — AEROBIC CULTURE W GRAM STAIN (SUPERFICIAL SPECIMEN)

## 2016-06-10 LAB — CBC
HEMATOCRIT: 25 % — AB (ref 39.0–52.0)
Hemoglobin: 7.9 g/dL — ABNORMAL LOW (ref 13.0–17.0)
MCH: 23.9 pg — AB (ref 26.0–34.0)
MCHC: 31.6 g/dL (ref 30.0–36.0)
MCV: 75.8 fL — AB (ref 78.0–100.0)
Platelets: 298 10*3/uL (ref 150–400)
RBC: 3.3 MIL/uL — ABNORMAL LOW (ref 4.22–5.81)
RDW: 16.7 % — AB (ref 11.5–15.5)
WBC: 11.8 10*3/uL — AB (ref 4.0–10.5)

## 2016-06-10 LAB — C-REACTIVE PROTEIN: CRP: 19.5 mg/dL — ABNORMAL HIGH (ref <1.0)

## 2016-06-10 LAB — SEDIMENTATION RATE: Sed Rate: 70 mm/hr — ABNORMAL HIGH (ref 0–16)

## 2016-06-10 LAB — PREPARE RBC (CROSSMATCH)

## 2016-06-10 MED ORDER — SODIUM CHLORIDE 0.9 % IV SOLN
Freq: Once | INTRAVENOUS | Status: AC
  Administered 2016-06-10: 14:00:00 via INTRAVENOUS

## 2016-06-10 MED ORDER — SODIUM CHLORIDE 0.9% FLUSH: 10.0000 mL | INTRAVENOUS | Status: DC | PRN

## 2016-06-10 NOTE — Progress Notes (Signed)
Patient ID: [name], male   DOB: January 14, 1946, 70 y.o.   MRN: [id number] No acute changes.  Awaiting PICC line.  Will need at least 2-4 weeks of IV antibiotics.  Final cultures grew out rare sensitive Staph.  Will treat with IV Rocephin.  Will also give one unit of blood and discharge to home tomorrow.

## 2016-06-10 NOTE — Progress Notes (Signed)
Patient ID: [name], male   DOB: 1946-04-03, 70 y.o.   MRN: [id number] Will actually keep him on Ancef 1 gm IV every 8 hours.

## 2016-06-10 NOTE — Progress Notes (Signed)
Pt was found to be shaking and stating that he had chills. Vitals were obtained with temp 99.5, HR 110's-130'3, and increased respirations. Rapid response was called to assess pt. Pt continued to have an increased HR and respirations and temp went up to 103.1. Tylenol was given. MD notified. Orders for a 500 cc NS bolus were given and completed. Pt became more stable and chills resolved. Will continue to monitor.

## 2016-06-10 NOTE — Progress Notes (Signed)
Peripherally Inserted Central Catheter/Midline Placement  The IV Nurse has discussed with the patient and/or persons authorized to consent for the patient, the purpose of this procedure and the potential benefits and risks involved with this procedure.  The benefits include less needle sticks, lab draws from the catheter and patient may be discharged home with the catheter.  Risks include, but not limited to, infection, bleeding, blood clot (thrombus formation), and puncture of an artery; nerve damage and irregular heat beat.  Alternatives to this procedure were also discussed.  PICC/Midline Placement Documentation  PICC Single Lumen 06/10/16 PICC Right Brachial 45 cm 1 cm (Active)  Indication for Insertion or Continuance of Line Home intravenous therapies (PICC only) 06/10/2016  4:00 PM  Exposed Catheter (cm) 1 cm 06/10/2016  4:00 PM  Dressing Change Due 06/17/16 06/10/2016  4:00 PM       Christella Noa Albarece 06/10/2016, 4:50 PM

## 2016-06-10 NOTE — Progress Notes (Signed)
Physical Therapy Treatment Patient Details Name: [name] MRN: [id number] DOB: 1946-09-28 Today's Date: Jun 25, 2016    History of Present Illness Pt s/p L hip acetabular revision.  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  Pt ambulated in hallway and then returned to supine in bed due to fatigue today.  Pt awaiting PICC line.    Follow Up Recommendations  Home health PT     Equipment Recommendations  None recommended by PT    Recommendations for Other Services       Precautions / Restrictions Precautions Precautions: Fall Restrictions Other Position/Activity Restrictions: WBAT    Mobility  Bed Mobility Overal bed mobility: Needs Assistance Bed Mobility: Sit to Supine     Supine to sit: Min assist Sit to supine: Min assist   General bed mobility comments: spouse assisted L LE, reminded spouse to lift with legs not her back  Transfers Overall transfer level: Needs assistance Equipment used: Rolling walker (2 wheeled) Transfers: Sit to/from Stand Sit to Stand: Supervision         General transfer comment: increased time and effort to rise, verbal cues for hand placement  Ambulation/Gait Ambulation/Gait assistance: Min guard;Supervision Ambulation Distance (Feet): 140 Feet Assistive device: Rolling walker (2 wheeled) Gait Pattern/deviations: Step-through pattern Gait velocity: decr   General Gait Details: pt using RW well, reports more pain in R LE (likely from compensation prior to surgery), states L LE less stiff with increase in distance   Stairs            Wheelchair Mobility    Modified Rankin (Stroke Patients Only)       Balance                                    Cognition Arousal/Alertness: Awake/alert Behavior During Therapy: WFL for tasks assessed/performed Overall Cognitive Status: Within Functional Limits for tasks  assessed                      Exercises      General Comments        Pertinent Vitals/Pain Pain Assessment: 0-10 Pain Score: 2  Pain Location: L hip sore Pain Descriptors / Indicators: Sore Pain Intervention(s): Limited activity within patient's tolerance;Monitored during session;Premedicated before session    Home Living                      Prior Function            PT Goals (current goals can now be found in the care plan section) Progress towards PT goals: Progressing toward goals    Frequency  7X/week    PT Plan Current plan remains appropriate    Co-evaluation             End of Session   Activity Tolerance: Patient tolerated treatment well Patient left: in bed;with family/visitor present     Time: [id number] PT Time Calculation (min) (ACUTE ONLY): 17 min  Charges:  $Gait Training: 8-22 mins                    G Codes:      Nobuko Gsell,KATHrine E Jun 25, 2016, 1:11 PM

## 2016-06-10 NOTE — Care Management Note (Signed)
Case Management Note  Patient Details  Name: [name] MRN: [id number] Date of Birth: 1946-09-19  Subjective/Objective:       70 yo admitted for Revision of left hip acetabular component             Action/Plan: From home with spouse. Pt needing IV abx at home and HHPT. Met with pt and wife at bedside for choice and [facility] was chosen. [facility] rep contacted for referral. Pt states he has no equipment needs for home (has bsc,rw and tub bench). CM will continue to follow.  Expected Discharge Date:  06/08/16               Expected Discharge Plan:  [place]  In-House Referral:     Discharge planning Services  CM Consult  Post Acute Care Choice:  Home Health Choice offered to:  Patient  DME Arranged:    DME Agency:     HH Arranged:  RN, PT [place] Agency:  [place]  Status of Service:  In process, will continue to follow  If discussed at Long Length of Stay Meetings, dates discussed:    Additional CommentsLynnell Catalan, RN 06/10/2016, 10:24 AM  [phone]

## 2016-06-11 ENCOUNTER — Encounter (HOSPITAL_COMMUNITY): Payer: Self-pay | Admitting: Orthopaedic Surgery

## 2016-06-11 DIAGNOSIS — Z7901 Long term (current) use of anticoagulants: Secondary | ICD-10-CM | POA: Diagnosis not present

## 2016-06-11 DIAGNOSIS — T84011A Broken internal left hip prosthesis, initial encounter: Secondary | ICD-10-CM | POA: Diagnosis not present

## 2016-06-11 DIAGNOSIS — Z96642 Presence of left artificial hip joint: Secondary | ICD-10-CM | POA: Diagnosis not present

## 2016-06-11 DIAGNOSIS — Z792 Long term (current) use of antibiotics: Secondary | ICD-10-CM | POA: Diagnosis not present

## 2016-06-11 DIAGNOSIS — Z8546 Personal history of malignant neoplasm of prostate: Secondary | ICD-10-CM | POA: Diagnosis not present

## 2016-06-11 DIAGNOSIS — Z452 Encounter for adjustment and management of vascular access device: Secondary | ICD-10-CM | POA: Diagnosis not present

## 2016-06-11 LAB — TYPE AND SCREEN
ABO/RH(D): A POS
Antibody Screen: NEGATIVE
Unit division: 0

## 2016-06-11 LAB — HEMOGLOBIN AND HEMATOCRIT, BLOOD
HCT: 26.4 % — ABNORMAL LOW (ref 39.0–52.0)
Hemoglobin: 8.6 g/dL — ABNORMAL LOW (ref 13.0–17.0)

## 2016-06-11 MED ORDER — CEFAZOLIN IN D5W 1 GM/50ML IV SOLN: 1.0000 g | Freq: Three times a day (TID) | INTRAVENOUS | 0 refills | Status: DC

## 2016-06-11 MED ORDER — HEPARIN SOD (PORK) LOCK FLUSH 100 UNIT/ML IV SOLN
250.0000 [IU] | INTRAVENOUS | Status: AC | PRN
  Administered 2016-06-11: 250 [IU]

## 2016-06-11 NOTE — Progress Notes (Signed)
Physical Therapy Treatment Patient Details Name: [name] MRN: [id number] DOB: Jun 15, 1946 Today's Date: 06/11/2016    History of Present Illness Pt s/p L hip acetabular revision.  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  Answered pt and spouse's questions and they feel ready for pt to d/c home today.  Follow Up Recommendations  Home health PT     Equipment Recommendations  None recommended by PT    Recommendations for Other Services       Precautions / Restrictions Precautions Precautions: Fall Restrictions Other Position/Activity Restrictions: WBAT    Mobility  Bed Mobility Overal bed mobility: Needs Assistance Bed Mobility: Sit to Supine;Supine to Sit     Supine to sit: Min assist Sit to supine: Min assist   General bed mobility comments: spouse assisted L LE, reminded spouse to lift with legs not her back  Transfers Overall transfer level: Needs assistance Equipment used: Rolling walker (2 wheeled) Transfers: Sit to/from Stand Sit to Stand: Supervision         General transfer comment: increased time and effort to rise, verbal cues for hand placement and spouse positioning to help hold RW  Ambulation/Gait Ambulation/Gait assistance: Supervision Ambulation Distance (Feet): 160 Feet Assistive device: Rolling walker (2 wheeled) Gait Pattern/deviations: Step-through pattern Gait velocity: decr   General Gait Details: pt using RW well, continues to report more pain in R LE (likely from compensation prior to surgery), states L LE less stiff with increase in distance   Stairs Stairs: Yes Stairs assistance: Min assist Stair Management: One rail Left;Forwards;Step to pattern;With cane Number of Stairs: 4 General stair comments: verbal cues for sequence, SPC, spouse educated on safely assisting, assist for LOB x1  upon initial descending   Wheelchair Mobility    Modified Rankin (Stroke Patients Only)       Balance                                    Cognition Arousal/Alertness: Awake/alert Behavior During Therapy: WFL for tasks assessed/performed Overall Cognitive Status: Within Functional Limits for tasks assessed                      Exercises      General Comments        Pertinent Vitals/Pain Pain Assessment: 0-10 Pain Score: 2  Pain Location: L hip sore Pain Descriptors / Indicators: Sore Pain Intervention(s): Limited activity within patient's tolerance;Monitored during session;Repositioned    Home Living                      Prior Function            PT Goals (current goals can now be found in the care plan section) Progress towards PT goals: Progressing toward goals    Frequency  7X/week    PT Plan Current plan remains appropriate    Co-evaluation             End of Session   Activity Tolerance: Patient tolerated treatment well Patient left: in bed;with family/visitor present;with call bell/phone within reach     Time: 4098-1191 PT Time Calculation (min) (ACUTE ONLY): 24 min  Charges:  $Gait Training: 8-22 mins  G Codes:      Tilak Oakley,KATHrine E 2016/07/08, 1:21 PM Zenovia Jarred, PT, DPT 08-Jul-2016 Pager: [phone]

## 2016-06-11 NOTE — Care Management Important Message (Signed)
Important Message  Patient Details  Name: [name] MRN: [id number] Date of Birth: 1946/03/29   Medicare Important Message Given:  Yes    Camillo Flaming 06/11/2016, 11:09 AMImportant Message  Patient Details  Name: [name] MRN: [id number] Date of Birth: Apr 27, 1946   Medicare Important Message Given:  Yes    Camillo Flaming 06/11/2016, 11:09 AM

## 2016-06-11 NOTE — Progress Notes (Signed)
Subjective: 4 Days Post-Op Procedure(s) (LRB): LEFT ANTERIOR HIP ACETABULAR REVISION (Left) Patient reports pain as mild.    Objective: Vital signs in last 24 hours: Temp:  [98.3 F (36.8 C)-99.7 F (37.6 C)] 98.4 F (36.9 C) (09/05 0533) Pulse Rate:  [75-83] 83 (09/05 0533) Resp:  [16-17] 17 (09/05 0533) BP: (124-136)/(56-68) 135/59 (09/05 0533) SpO2:  [95 %-100 %] 98 % (09/05 0533)  Intake/Output from previous day: 09/04 0701 - 09/05 0700 In: [id number] [P.O.:750; I.V.:150; Blood:335] Out: 1200 [Urine:1200] Intake/Output this shift: Total I/O In: 120 [P.O.:120] Out: 200 [Urine:200]   Recent Labs  06/09/16 0455 06/10/16 0420 06/11/16 0850  HGB 8.7* 7.9* 8.6*    Recent Labs  06/09/16 0455 06/10/16 0420 06/11/16 0850  WBC 12.6* 11.8*  --   RBC 3.55* 3.30*  --   HCT 27.4* 25.0* 26.4*  PLT 324 298  --    No results for input(s): NA, K, CL, CO2, BUN, CREATININE, GLUCOSE, CALCIUM in the last 72 hours. No results for input(s): LABPT, INR in the last 72 hours.  Incision: dressing C/D/I Ambulates about room minimal assistance Assessment/Plan: 4 Days Post-Op Procedure(s) (LRB): LEFT ANTERIOR HIP ACETABULAR REVISION (Left) Discharge home with home health 4 weeks of Ancef IV  Genavie Boettger 06/11/2016, 9:10 AM

## 2016-06-11 NOTE — Progress Notes (Signed)
[facility]  Patient Status: New pt for [facility] this admission  [facility] is providing the following services:  HHRN and Home [facility]fusion pharmacy for home IV ABX. Hands on IV teaching with wife yesterday and she did very well with IV ABX set up and administration.[facility] is prepared for DC today and SOC at home. We will follow until DC to ensure [facility] needs are met and provide smooth transition home.  If patient discharges after hours, please call [phone].   [name] 06/11/2016, 8:25 AM

## 2016-06-11 NOTE — Discharge Summary (Signed)
Patient ID: [name] MRN: [id number] DOB/AGE: 71/25/1947 71 y.o.  Admit date: 06/07/2016 Discharge date: 06/11/2016  Admission Diagnoses:  Principal Problem:   Encounter for surveillance of recalled total hip arthroplasty hardware [facility]) Active Problems:   Status post revision of total hip replacement   Discharge Diagnoses:  Same  Past Medical History:  Diagnosis Date  . Anterior dislocation of right shoulder 09/2013   Reduced in ED under sedation.  Dislocated posteriorly 2015, ? partial RC tear, has f/u planned with Dr. Marlou Sa--? surgery? possible plan for 2016.  [name] Blood donor, whole blood    Has donated approx 30 gallons in his lifetime  . Chronic renal insufficiency, stage II (mild)    CrCl in the 60s  followed by [facility]   . Chronic venous insufficiency    +compression hose  . DDD (degenerative disc disease), lumbar 2017   Dr. Noe Gens ortho.  He is now wearing a new leg brace.  Has had ESI and will get another.  . Foot drop, left    s/p hip fracture 1969  . Hx of adenomatous colonic polyps    polypectomy 2007; 2012; 2015 (+adenomatous, no high grade dysplasia); recall 5 yrs  . Osteoarthritis    knees and hips primarily  . 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  As of urol f/u 08/2014, plan is for me to follow PSAs and fax results to Dr. Junious Silk: PSA 02/2015 was 0.21 (stable).  Stable on f/u with Dr. Junious Silk 08/2015.  . Toe infection 05/27/2013   Left great toe and right 2nd toe  . Urge incontinence    vesicare helpful    Surgeries: Procedure(s): LEFT ANTERIOR HIP ACETABULAR REVISION on 06/07/2016   Consultants: none  Discharged Condition: Improved  Hospital Course: [name] is an 70 y.o. male who was admitted 06/07/2016 for operative treatment ofEncounter for surveillance of recalled total hip arthroplasty hardware ([place]). Patient has severe unremitting pain that  affects sleep, daily activities, and work/hobbies. After pre-op clearance the patient was taken to the operating room on 06/07/2016 and underwent  Procedure(s): LEFT ANTERIOR HIP ACETABULAR REVISION.    Patient was given perioperative antibiotics: Anti-infectives    Start     Dose/Rate Route Frequency Ordered Stop   06/11/16 0000  ceFAZolin (ANCEF) 1 GM/50ML     1 g 100 mL/hr over 30 Minutes Intravenous Every 8 hours 06/11/16 0804     06/09/16 1500  ceFAZolin (ANCEF) IVPB 1 g/50 mL premix     1 g 100 mL/hr over 30 Minutes Intravenous Every 8 hours 06/09/16 1442     06/07/16 2000  ceFAZolin (ANCEF) IVPB 1 g/50 mL premix     1 g 100 mL/hr over 30 Minutes Intravenous Every 6 hours 06/07/16 1808 06/08/16 0224   06/07/16 1159  ceFAZolin (ANCEF) IVPB 2g/100 mL premix     2 g 200 mL/hr over 30 Minutes Intravenous On call to O.R. 06/07/16 1159 06/07/16 1353       Patient was given sequential compression devices, early ambulation, and chemoprophylaxis to prevent DVT.  Patient benefited maximally from hospital stay and there were no complications.    Recent vital signs: Patient Vitals for the past 24 hrs:  BP Temp Temp src Pulse Resp SpO2  06/11/16 0533 (!) 135/59 98.4 F (36.9 C) Oral 83 17 98 %  06/10/16 2205 (!) 126/57 99.7 F (37.6 C) Oral 75 17 95 %  06/10/16  2035 128/60 98.7 F (37.1 C) Oral 78 16 98 %  06/10/16 2000 130/62 98.4 F (36.9 C) Oral 80 17 100 %  06/10/16 1744 134/68 98.3 F (36.8 C) Oral 82 16 99 %  06/10/16 1714 136/66 98.9 F (37.2 C) Oral 78 16 96 %  06/10/16 1346 (!) 124/56 99.4 F (37.4 C) Oral 80 16 96 %     Recent laboratory studies:  Recent Labs  06/09/16 0455 06/10/16 0420 06/11/16 0850  WBC 12.6* 11.8*  --   HGB 8.7* 7.9* 8.6*  HCT 27.4* 25.0* 26.4*  PLT 324 298  --      Discharge Medications:     Medication List    STOP taking these medications   acetaminophen 325 MG tablet Commonly known as:  TYLENOL   aspirin 81 MG tablet Replaced  by:  aspirin 325 MG EC tablet   oxyCODONE 5 MG immediate release tablet Commonly known as:  Oxy IR/ROXICODONE     TAKE these medications   aspirin 325 MG EC tablet Take 1 tablet (325 mg total) by mouth daily. Replaces:  aspirin 81 MG tablet   ceFAZolin 1 GM/50ML Commonly known as:  ANCEF Inject 50 mLs (1 g total) into the vein every 8 (eight) hours.   oxyCODONE-acetaminophen 5-325 MG tablet Commonly known as:  ROXICET Take 1-2 tablets by mouth every 4 (four) hours as needed for severe pain.   solifenacin 5 MG tablet Commonly known as:  VESICARE Take 5 mg by mouth daily.   tiZANidine 2 MG tablet Commonly known as:  ZANAFLEX Take 1-2 tablets (2-4 mg total) by mouth every 6 (six) hours as needed for muscle spasms.   traMADol 50 MG tablet Commonly known as:  ULTRAM Take 50 mg by mouth every 8 (eight) hours as needed for moderate pain.       Diagnostic Studies: Dg C-arm 61-120 Min-no Report  Result Date: 06/07/2016 CLINICAL DATA: left anterior hip revision C-ARM 61-120 MINUTES Fluoroscopy was utilized by the requesting physician.  No radiographic interpretation.   Dg Hip Port Unilat With Pelvis 1v Left  Result Date: 06/07/2016 CLINICAL DATA:  Revision of left hip arthroplasty EXAM: DG HIP (WITH OR WITHOUT PELVIS) 1V PORT LEFT COMPARISON:  05/09/2016 FINDINGS: Left total hip arthroplasty changes identified. No complicating features identified. Prostate seeds again noted. IMPRESSION: Left total hip arthroplasty without complicating features. Electronically Signed   By: Margarette Canada M.D.   On: 06/07/2016 17:12   Dg Hip Operative Unilat With Pelvis Left  Result Date: 06/07/2016 CLINICAL DATA:  Left acetabular revision EXAM: OPERATIVE LEFT HIP (WITH PELVIS IF PERFORMED) 3 VIEWS TECHNIQUE: Fluoroscopic spot image(s) were submitted for interpretation post-operatively. COMPARISON:  05/09/2016 left hip radiographs FINDINGS: Fluoroscopy time 0.9 minutes. Three nondiagnostic spot fluoroscopic  intraoperative left hip radiographs demonstrate postsurgical changes from completion of left total hip arthroplasty with left acetabular prosthetic placement. Brachytherapy seeds overlie the prostate. IMPRESSION: Intraoperative fluoroscopic guidance for left acetabular revision. Electronically Signed   By: Ilona Sorrel M.D.   On: 06/07/2016 16:07    Disposition: 01-Home or Self Care       Signed: Erskine Emery 06/11/2016, 9:13 AM

## 2016-06-12 DIAGNOSIS — Z452 Encounter for adjustment and management of vascular access device: Secondary | ICD-10-CM | POA: Diagnosis not present

## 2016-06-12 DIAGNOSIS — Z8546 Personal history of malignant neoplasm of prostate: Secondary | ICD-10-CM | POA: Diagnosis not present

## 2016-06-12 DIAGNOSIS — Z96642 Presence of left artificial hip joint: Secondary | ICD-10-CM | POA: Diagnosis not present

## 2016-06-12 DIAGNOSIS — T84011A Broken internal left hip prosthesis, initial encounter: Secondary | ICD-10-CM | POA: Diagnosis not present

## 2016-06-12 DIAGNOSIS — M1712 Unilateral primary osteoarthritis, left knee: Secondary | ICD-10-CM | POA: Diagnosis not present

## 2016-06-12 DIAGNOSIS — M1711 Unilateral primary osteoarthritis, right knee: Secondary | ICD-10-CM | POA: Diagnosis not present

## 2016-06-12 DIAGNOSIS — G629 Polyneuropathy, unspecified: Secondary | ICD-10-CM | POA: Diagnosis not present

## 2016-06-12 DIAGNOSIS — Z792 Long term (current) use of antibiotics: Secondary | ICD-10-CM | POA: Diagnosis not present

## 2016-06-12 DIAGNOSIS — E669 Obesity, unspecified: Secondary | ICD-10-CM | POA: Diagnosis not present

## 2016-06-12 DIAGNOSIS — Z7901 Long term (current) use of anticoagulants: Secondary | ICD-10-CM | POA: Diagnosis not present

## 2016-06-12 DIAGNOSIS — I872 Venous insufficiency (chronic) (peripheral): Secondary | ICD-10-CM | POA: Diagnosis not present

## 2016-06-12 DIAGNOSIS — M4806 Spinal stenosis, lumbar region: Secondary | ICD-10-CM | POA: Diagnosis not present

## 2016-06-12 LAB — ANAEROBIC CULTURE

## 2016-06-14 ENCOUNTER — Encounter (HOSPITAL_BASED_OUTPATIENT_CLINIC_OR_DEPARTMENT_OTHER): Payer: Self-pay | Admitting: Emergency Medicine

## 2016-06-14 ENCOUNTER — Emergency Department (HOSPITAL_BASED_OUTPATIENT_CLINIC_OR_DEPARTMENT_OTHER)
Admission: EM | Admit: 2016-06-14 | Discharge: 2016-06-14 | Disposition: A | Discharge status: Home / Self Care | Payer: PPO | Attending: Emergency Medicine | Admitting: Emergency Medicine

## 2016-06-14 ENCOUNTER — Emergency Department (HOSPITAL_BASED_OUTPATIENT_CLINIC_OR_DEPARTMENT_OTHER): Payer: PPO

## 2016-06-14 DIAGNOSIS — I82409 Acute embolism and thrombosis of unspecified deep veins of unspecified lower extremity: Secondary | ICD-10-CM

## 2016-06-14 DIAGNOSIS — I82412 Acute embolism and thrombosis of left femoral vein: Secondary | ICD-10-CM | POA: Diagnosis not present

## 2016-06-14 DIAGNOSIS — Z8546 Personal history of malignant neoplasm of prostate: Secondary | ICD-10-CM | POA: Insufficient documentation

## 2016-06-14 DIAGNOSIS — R6883 Chills (without fever): Secondary | ICD-10-CM | POA: Diagnosis not present

## 2016-06-14 DIAGNOSIS — I82403 Acute embolism and thrombosis of unspecified deep veins of lower extremity, bilateral: Secondary | ICD-10-CM | POA: Insufficient documentation

## 2016-06-14 DIAGNOSIS — N182 Chronic kidney disease, stage 2 (mild): Secondary | ICD-10-CM | POA: Insufficient documentation

## 2016-06-14 DIAGNOSIS — I82411 Acute embolism and thrombosis of right femoral vein: Secondary | ICD-10-CM | POA: Diagnosis not present

## 2016-06-14 DIAGNOSIS — M7989 Other specified soft tissue disorders: Secondary | ICD-10-CM | POA: Diagnosis not present

## 2016-06-14 HISTORY — DX: Acute embolism and thrombosis of unspecified deep veins of unspecified lower extremity: I82.409

## 2016-06-14 MED ORDER — RIVAROXABAN (XARELTO) VTE STARTER PACK (15 & 20 MG): ORAL_TABLET | ORAL | 0 refills | Status: DC

## 2016-06-14 MED ORDER — RIVAROXABAN (XARELTO) EDUCATION KIT FOR DVT/PE PATIENTS
PACK | Freq: Once | Status: AC
Start: 2016-06-14 — End: 2016-06-14
  Administered 2016-06-14: 23:00:00

## 2016-06-14 MED ORDER — CEFAZOLIN IN D5W 1 GM/50ML IV SOLN
1.0000 g | Freq: Once | INTRAVENOUS | Status: AC
  Administered 2016-06-14: 1 g via INTRAVENOUS
  Filled 2016-06-14: qty 50

## 2016-06-14 MED ORDER — HEPARIN SOD (PORK) LOCK FLUSH 100 UNIT/ML IV SOLN
500.0000 [IU] | Freq: Once | INTRAVENOUS | Status: AC
  Administered 2016-06-14: 500 [IU]
  Filled 2016-06-14: qty 5

## 2016-06-14 MED ORDER — RIVAROXABAN 15 MG PO TABS
15.0000 mg | ORAL_TABLET | Freq: Once | ORAL | Status: AC
  Administered 2016-06-14: 15 mg via ORAL
  Filled 2016-06-14: qty 1

## 2016-06-14 NOTE — ED Triage Notes (Signed)
Pt states he was in hospital for hip replacement and his legs have been swelling.  Right leg mainly.

## 2016-06-14 NOTE — ED Notes (Signed)
MD at bedside. 

## 2016-06-14 NOTE — ED Notes (Signed)
Pt states today the PT was concerned with the increased swelling and warmth of the pt's R lower leg.

## 2016-06-14 NOTE — ED Provider Notes (Addendum)
[facility] Provider Note   CSN: [id number] Arrival date & time: 06/14/16  1835  By signing my name below, I, [name], attest that this documentation has been prepared under the direction and in the presence of Malvin Johns, MD. Electronically Signed: Hansel Hughes, ED Scribe. 06/14/16. 9:05 PM.    History   Chief Complaint Chief Complaint  Patient presents with  . Leg Swelling    HPI [name] is a 70 y.o. male s/p left anterior hip revision 06/07/16 who presents to the Emergency Department complaining of moderate, gradually worsening right calf pain and swelling for 7 days. Pt states after his left hip replacement 7 days ago, he had bilateral leg swelling while he was in the hospital and reports that he put all his weight on the right leg d/t the recent procedure on the left leg. He states that his left leg is still currently swollen, but this has greatly improved since the procedure. Pt states his right leg pain and swelling began after the procedure. Wife also notes the pt has been experiencing chills since the procedure. Pt reports he has been feeling otherwise well apart from his leg pain and swelling. He reports he has not yet been ambulatory since the procedure. He reports h/o superficial phlebitis, and is not on any blood thinners aside from qd 325 mg ASA. He denies CP, SOB, nausea, emesis, chest congestion, weakness, fatigue, known fever.  The history is provided by the patient. No language interpreter was used.    Past Medical History:  Diagnosis Date  . Anterior dislocation of right shoulder 09/2013   Reduced in ED under sedation.  Dislocated posteriorly 2015, ? partial RC tear, has f/u planned with Dr. Marlou Sa--? surgery? possible plan for 2016.  [name] Blood donor, whole blood    Has donated approx 30 gallons in his lifetime  . Chronic renal insufficiency, stage II (mild)    CrCl in the 60s  followed by [facility]   . Chronic venous insufficiency    +compression hose  . DDD (degenerative disc disease), lumbar 2017   Dr. Noe Gens ortho.  He is now wearing a new leg brace.  Has had ESI and will get another.  . Foot drop, left    s/p hip fracture 1969  . Hx of adenomatous colonic polyps    polypectomy 2007; 2012; 2015 (+adenomatous, no high grade dysplasia); recall 5 yrs  . Osteoarthritis    knees and hips primarily  . 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  As of urol f/u 08/2014, plan is for me to follow PSAs and fax results to Dr. Junious Silk: PSA 02/2015 was 0.21 (stable).  Stable on f/u with Dr. Junious Silk 08/2015.  . Toe infection 05/27/2013   Left great toe and right 2nd toe  . Urge incontinence    vesicare helpful    Patient Active Problem List   Diagnosis Date Noted  . Encounter for surveillance of recalled total hip arthroplasty hardware ([place]) 06/07/2016  . Status post revision of total hip replacement 06/07/2016  . 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 Red blood cell abnormality 02/11/2016  . Elevated blood pressure reading without diagnosis of hypertension 07/01/2013  . Peripheral neuropathy ([place]) 06/02/2013  . Health maintenance examination 11/26/2011  . ELEVATED PROSTATE SPECIFIC ANTIGEN 05/21/2010  . Obesity, Class I, BMI 30-34.9 05/18/2010  . VENOUS INSUFFICIENCY, CHRONIC 05/18/2010  . OSTEOARTHRITIS, KNEES,  BILATERAL 05/18/2010    Past Surgical History:  Procedure Laterality Date  . ANTERIOR HIP REVISION Left 06/07/2016   Procedure: LEFT ANTERIOR HIP ACETABULAR REVISION;  Surgeon: Mcarthur Rossetti, MD;  Location: [facility];  Service: Orthopedics;  Laterality: Left;  . arthroscopy left shoulder Left 07  . bilateral carpal tunnel release  2003  . BILATERAL CARPAL TUNNEL RELEASE    . COLONOSCOPY  2007;2012;2015   04/12/2014 Tubular adenoma x 1; per Dr. Henrene Pastor recall 5 yrs  . FRACTURE SURGERY Left     . JOINT REPLACEMENT Left    hip  . LUMBAR LAMINECTOMY/DECOMPRESSION MICRODISCECTOMY Left 03/28/2016   Procedure: Left Lumbar three-four Laminoforaminotomy;  Surgeon: Erline Levine, MD;  Location: [facility];  Service: Neurosurgery;  Laterality: Left;  left  . RADIOACTIVE SEED IMPLANT  2012  . ROTATOR CUFF REPAIR  2006   right  . seed implant prostate  2012  . SHOULDER ARTHROSCOPY Left   . TOTAL HIP ARTHROPLASTY  2008   left (cobalt chrome)  . VASECTOMY         Home Medications    Prior to Admission medications   Medication Sig Start Date End Date Taking? Authorizing Provider  ceFAZolin (ANCEF) 1 GM/50ML Inject 50 mLs (1 g total) into the vein every 8 (eight) hours. 06/11/16   Pete Pelt, PA-C  oxyCODONE-acetaminophen (ROXICET) 5-325 MG tablet Take 1-2 tablets by mouth every 4 (four) hours as needed for severe pain. 06/09/16   Pete Pelt, PA-C  Rivaroxaban 15 & 20 MG TBPK Take as directed on package: Start with one 67m tablet by mouth twice a day with food. On Day 22, switch to one 236mtablet once a day with food. 06/14/16   MeMalvin JohnsMD  solifenacin (VESICARE) 5 MG tablet Take 5 mg by mouth daily.    Historical Provider, MD  tiZANidine (ZANAFLEX) 2 MG tablet Take 1-2 tablets (2-4 mg total) by mouth every 6 (six) hours as needed for muscle spasms. Patient not taking: Reported on 05/28/2016 03/28/16   JoErline LevineMD  traMADol (ULTRAM) 50 MG tablet Take 50 mg by mouth every 8 (eight) hours as needed for moderate pain.  05/09/16   Historical Provider, MD    Family History Family History  Problem Relation Age of Onset  . Lung cancer Mother   . Heart disease Father     Social History Social History  Substance Use Topics  . Smoking status: Never Smoker  . Smokeless tobacco: Never Used  . Alcohol use No     Allergies   Review of patient's allergies indicates no known allergies.   Review of Systems Review of Systems  Constitutional: Positive for chills. Negative  for diaphoresis, fatigue and fever.  HENT: Negative for congestion, rhinorrhea and sneezing.   Eyes: Negative.   Respiratory: Negative for cough, chest tightness and shortness of breath.   Cardiovascular: Positive for leg swelling. Negative for chest pain.  Gastrointestinal: Negative for abdominal pain, blood in stool, diarrhea, nausea and vomiting.  Genitourinary: Negative for difficulty urinating, flank pain, frequency and hematuria.  Musculoskeletal: Positive for myalgias. Negative for arthralgias and back pain.  Skin: Negative for rash.  Neurological: Negative for dizziness, speech difficulty, weakness, numbness and headaches.     Physical Exam Updated Vital Signs BP 126/64   Pulse 68   Temp 98.8 F (37.1 C) (Oral)   Resp 16   Ht '6\' 3"'  (1.905 m)   Wt 235 lb (106.6 kg)   SpO2 92%  BMI 29.37 kg/m   Physical Exam  Constitutional: He is oriented to person, place, and time. He appears well-developed and well-nourished.  HENT:  Head: Normocephalic and atraumatic.  Eyes: Pupils are equal, round, and reactive to light.  Neck: Normal range of motion. Neck supple.  Cardiovascular: Normal rate, regular rhythm and normal heart sounds.   Pulmonary/Chest: Effort normal and breath sounds normal. No respiratory distress. He has no wheezes. He has no rales. He exhibits no tenderness.  Abdominal: Soft. Bowel sounds are normal. There is no tenderness. There is no rebound and no guarding.  Musculoskeletal: Normal range of motion. He exhibits edema.  Swelling of both lower extremities, the right is greater than the left. There is no warmth or erythema. Pedal pulses intact. The incision is clean without drainage. There is some mild erythema adjacent to the incision.   Lymphadenopathy:    He has no cervical adenopathy.  Neurological: He is alert and oriented to person, place, and time.  Skin: Skin is warm and dry. No rash noted.  Psychiatric: He has a normal mood and affect.  Nursing note and  vitals reviewed.    ED Treatments / Results   DIAGNOSTIC STUDIES: Oxygen Saturation is 95% on RA, adequate by my interpretation.    COORDINATION OF CARE: 9:01 PM Discussed treatment plan with pt at bedside which includes [place] and pt agreed to plan.    Labs (all labs ordered are listed, but only abnormal results are displayed) Labs Reviewed - No data to display  EKG  EKG Interpretation None       Radiology US Venous Img Lower Bilateral  Result Date: 06/14/2016 CLINICAL DATA:  70 year old male with bilateral lower extremity swelling. History of recent hip replacement. EXAM: BILATERAL LOWER EXTREMITY VENOUS DOPPLER ULTRASOUND TECHNIQUE: Gray-scale sonography with graded compression, as well as color Doppler and duplex ultrasound were performed to evaluate the lower extremity deep venous systems from the level of the common femoral vein and including the common femoral, femoral, profunda femoral, popliteal and calf veins including the posterior tibial, peroneal and gastrocnemius veins when visible. The superficial great saphenous vein was also interrogated. Spectral Doppler was utilized to evaluate flow at rest and with distal augmentation maneuvers in the common femoral, femoral and popliteal veins. COMPARISON:  None. FINDINGS: RIGHT LOWER EXTREMITY Common Femoral Vein: No evidence of thrombus. Normal compressibility, respiratory phasicity and response to augmentation. Saphenofemoral Junction: No evidence of thrombus. Normal compressibility and flow on color Doppler imaging. Profunda Femoral Vein: No evidence of thrombus. Normal compressibility and flow on color Doppler imaging. Femoral Vein: There is occlusive thrombus throughout the femoral vein extending to the level of bifurcation of the common femoral vein. Popliteal Vein: There is occlusive thrombus throughout the popliteal vein. Calf Veins: There is partially occlusive thrombus in the posterior tibial vein. The peroneal vein is not  visualized. Superficial Great Saphenous Vein: No evidence of thrombus. Normal compressibility and flow on color Doppler imaging. Venous Reflux:  None. Other Findings:  None. LEFT LOWER EXTREMITY Common Femoral Vein: There is partially occlusive thrombus in the common femoral vein with partial compressibility of the vessel. Saphenofemoral Junction: No evidence of thrombus. Normal compressibility and flow on color Doppler imaging. Profunda Femoral Vein: There is occlusive thrombus in the profundus femoris. Femoral Vein: There is occlusive thrombus in the left femoral vein with noncompressibility of the vessel. Popliteal Vein: There is occlusive thrombus in the popliteal vein. Calf Veins: Occlusive thrombus noted in the posterior tibial vein. The peroneal vein is  not visualized. Superficial Great Saphenous Vein: No evidence of thrombus. Normal compressibility and flow on color Doppler imaging. Venous Reflux:  None. Other Findings:  None. IMPRESSION: Extensive bilateral lower extremity DVTs with proximal extension to the common femoral vein on the left and femoral vein on the right. These results were called by telephone at the time of interpretation on 06/14/2016 at 10:29 pm to Dr. Threasa Beards Harding Thomure , who verbally acknowledged these results. Electronically Signed   By: Anner Crete M.D.   On: 06/14/2016 22:31    Procedures Procedures (including critical care time)  Medications Ordered in ED Medications  ceFAZolin (ANCEF) IVPB 1 g/50 mL premix (1 g Intravenous New Bag/Given 06/14/16 2237)  Rivaroxaban (XARELTO) tablet 15 mg (not administered)  rivaroxaban (XARELTO) Education Kit for DVT/PE patients (not administered)     Initial Impression / Assessment and Plan / ED Course  I have reviewed the triage vital signs and the nursing notes.  Pertinent labs & imaging results that were available during my care of the patient were reviewed by me and considered in my medical decision making (see chart for  details).  Clinical Course    Patient presents with bilateral leg swelling. He has evidence of bilateral DVTs. There is no symptoms that would be more concerning for a pulmonary embolus. He has no chest pain shortness of breath or hypoxia. He will be started on Xarelto. His creatinine clearance is 68. He was given instructions regarding taking blood thinners. He was advised to follow-up with his PCP for ongoing management. Return precautions were given.  He is currently receiving Ancef 3 times a day at home. He was given his nightly dose here in the ED.  Final Clinical Impressions(s) / ED Diagnoses   Final diagnoses:  DVT (deep venous thrombosis), bilateral    New Prescriptions New Prescriptions   RIVAROXABAN 15 & 20 MG TBPK    Take as directed on package: Start with one 47m tablet by mouth twice a day with food. On Day 22, switch to one 25mtablet once a day with food.    I personally performed the services described in this documentation, which was scribed in my presence.  The recorded information has been reviewed and considered.    MeMalvin JohnsMD 06/14/16 22Little YorkMD 06/14/16 2256

## 2016-06-14 NOTE — ED Notes (Signed)
Xeralto starter pack given to pt and education provided. Pt verbalized understanding and correctly repeated back reasons to return to ED.

## 2016-06-14 NOTE — ED Notes (Signed)
Patient transported to Ultrasound 

## 2016-06-17 ENCOUNTER — Telehealth: Payer: Self-pay

## 2016-06-17 ENCOUNTER — Encounter: Payer: Self-pay | Admitting: Family Medicine

## 2016-06-17 DIAGNOSIS — T84011A Broken internal left hip prosthesis, initial encounter: Secondary | ICD-10-CM | POA: Diagnosis not present

## 2016-06-17 DIAGNOSIS — Z96642 Presence of left artificial hip joint: Secondary | ICD-10-CM | POA: Diagnosis not present

## 2016-06-17 DIAGNOSIS — Z452 Encounter for adjustment and management of vascular access device: Secondary | ICD-10-CM | POA: Diagnosis not present

## 2016-06-17 DIAGNOSIS — Z8546 Personal history of malignant neoplasm of prostate: Secondary | ICD-10-CM | POA: Diagnosis not present

## 2016-06-17 DIAGNOSIS — Z792 Long term (current) use of antibiotics: Secondary | ICD-10-CM | POA: Diagnosis not present

## 2016-06-17 DIAGNOSIS — Z7901 Long term (current) use of anticoagulants: Secondary | ICD-10-CM | POA: Diagnosis not present

## 2016-06-17 NOTE — Telephone Encounter (Signed)
Patients wife called stating that patient had hip replacement, went to emergency care and was diagnosed with blood clot and placed on Xarelto. Patient wife wanting to know if any further treatment needed, please advise.

## 2016-06-17 NOTE — Telephone Encounter (Signed)
He will need to stay on xarelto as prescribed for a minimum of 3 months.  No further treatment besides this.-thx

## 2016-06-18 DIAGNOSIS — I872 Venous insufficiency (chronic) (peripheral): Secondary | ICD-10-CM | POA: Diagnosis not present

## 2016-06-18 DIAGNOSIS — T84011A Broken internal left hip prosthesis, initial encounter: Secondary | ICD-10-CM | POA: Diagnosis not present

## 2016-06-18 DIAGNOSIS — E669 Obesity, unspecified: Secondary | ICD-10-CM | POA: Diagnosis not present

## 2016-06-18 DIAGNOSIS — Z96642 Presence of left artificial hip joint: Secondary | ICD-10-CM | POA: Diagnosis not present

## 2016-06-18 DIAGNOSIS — Z452 Encounter for adjustment and management of vascular access device: Secondary | ICD-10-CM | POA: Diagnosis not present

## 2016-06-18 DIAGNOSIS — Z8546 Personal history of malignant neoplasm of prostate: Secondary | ICD-10-CM | POA: Diagnosis not present

## 2016-06-18 DIAGNOSIS — M1712 Unilateral primary osteoarthritis, left knee: Secondary | ICD-10-CM | POA: Diagnosis not present

## 2016-06-18 DIAGNOSIS — M4806 Spinal stenosis, lumbar region: Secondary | ICD-10-CM | POA: Diagnosis not present

## 2016-06-18 DIAGNOSIS — G629 Polyneuropathy, unspecified: Secondary | ICD-10-CM | POA: Diagnosis not present

## 2016-06-18 DIAGNOSIS — Z792 Long term (current) use of antibiotics: Secondary | ICD-10-CM | POA: Diagnosis not present

## 2016-06-18 DIAGNOSIS — M1711 Unilateral primary osteoarthritis, right knee: Secondary | ICD-10-CM | POA: Diagnosis not present

## 2016-06-18 DIAGNOSIS — Z7901 Long term (current) use of anticoagulants: Secondary | ICD-10-CM | POA: Diagnosis not present

## 2016-06-19 ENCOUNTER — Other Ambulatory Visit (INDEPENDENT_AMBULATORY_CARE_PROVIDER_SITE_OTHER): Payer: PPO

## 2016-06-19 ENCOUNTER — Telehealth: Payer: Self-pay | Admitting: Family Medicine

## 2016-06-19 ENCOUNTER — Other Ambulatory Visit: Payer: Self-pay | Admitting: Family Medicine

## 2016-06-19 DIAGNOSIS — R350 Frequency of micturition: Secondary | ICD-10-CM | POA: Diagnosis not present

## 2016-06-19 LAB — POC URINALSYSI DIPSTICK (AUTOMATED)
BILIRUBIN UA: NEGATIVE
Blood, UA: NEGATIVE
Glucose, UA: NEGATIVE
KETONES UA: NEGATIVE
LEUKOCYTES UA: NEGATIVE
Nitrite, UA: NEGATIVE
Protein, UA: NEGATIVE
Spec Grav, UA: 1.015
Urobilinogen, UA: 0.2
pH, UA: 7

## 2016-06-19 MED ORDER — SULFAMETHOXAZOLE-TRIMETHOPRIM 800-160 MG PO TABS: 1.0000 | ORAL_TABLET | Freq: Two times a day (BID) | ORAL | 0 refills | Status: DC

## 2016-06-19 NOTE — Telephone Encounter (Signed)
Patients wife came in today to ask for urinary collection cup for pt's urinary urgency.  I offered patient appt but he is having a lot of medical problems and it is extremely hard to get him out of the house.  Dr Anitra Lauth agreed to do u/a orders this time only due to pt having hard time getting out of the house.

## 2016-06-20 LAB — URINE CULTURE: Organism ID, Bacteria: NO GROWTH

## 2016-06-20 NOTE — Telephone Encounter (Signed)
Agree/noted.   See additional info in result note attached to UA from today.

## 2016-06-21 DIAGNOSIS — I872 Venous insufficiency (chronic) (peripheral): Secondary | ICD-10-CM | POA: Diagnosis not present

## 2016-06-21 DIAGNOSIS — M4806 Spinal stenosis, lumbar region: Secondary | ICD-10-CM | POA: Diagnosis not present

## 2016-06-21 DIAGNOSIS — E669 Obesity, unspecified: Secondary | ICD-10-CM | POA: Diagnosis not present

## 2016-06-21 DIAGNOSIS — Z452 Encounter for adjustment and management of vascular access device: Secondary | ICD-10-CM | POA: Diagnosis not present

## 2016-06-21 DIAGNOSIS — M1712 Unilateral primary osteoarthritis, left knee: Secondary | ICD-10-CM | POA: Diagnosis not present

## 2016-06-21 DIAGNOSIS — Z7901 Long term (current) use of anticoagulants: Secondary | ICD-10-CM | POA: Diagnosis not present

## 2016-06-21 DIAGNOSIS — M1711 Unilateral primary osteoarthritis, right knee: Secondary | ICD-10-CM | POA: Diagnosis not present

## 2016-06-21 DIAGNOSIS — T84011A Broken internal left hip prosthesis, initial encounter: Secondary | ICD-10-CM | POA: Diagnosis not present

## 2016-06-21 DIAGNOSIS — Z792 Long term (current) use of antibiotics: Secondary | ICD-10-CM | POA: Diagnosis not present

## 2016-06-21 DIAGNOSIS — G629 Polyneuropathy, unspecified: Secondary | ICD-10-CM | POA: Diagnosis not present

## 2016-06-21 DIAGNOSIS — Z8546 Personal history of malignant neoplasm of prostate: Secondary | ICD-10-CM | POA: Diagnosis not present

## 2016-06-21 DIAGNOSIS — Z96642 Presence of left artificial hip joint: Secondary | ICD-10-CM | POA: Diagnosis not present

## 2016-06-24 DIAGNOSIS — M25552 Pain in left hip: Secondary | ICD-10-CM | POA: Diagnosis not present

## 2016-06-24 DIAGNOSIS — Z7901 Long term (current) use of anticoagulants: Secondary | ICD-10-CM | POA: Diagnosis not present

## 2016-06-24 DIAGNOSIS — T84011A Broken internal left hip prosthesis, initial encounter: Secondary | ICD-10-CM | POA: Diagnosis not present

## 2016-06-24 DIAGNOSIS — Z452 Encounter for adjustment and management of vascular access device: Secondary | ICD-10-CM | POA: Diagnosis not present

## 2016-06-24 DIAGNOSIS — Z96642 Presence of left artificial hip joint: Secondary | ICD-10-CM | POA: Diagnosis not present

## 2016-06-24 DIAGNOSIS — Z792 Long term (current) use of antibiotics: Secondary | ICD-10-CM | POA: Diagnosis not present

## 2016-06-24 DIAGNOSIS — T84051D Periprosthetic osteolysis of internal prosthetic left hip joint, subsequent encounter: Secondary | ICD-10-CM | POA: Diagnosis not present

## 2016-06-24 DIAGNOSIS — I872 Venous insufficiency (chronic) (peripheral): Secondary | ICD-10-CM | POA: Diagnosis not present

## 2016-06-24 DIAGNOSIS — Z8546 Personal history of malignant neoplasm of prostate: Secondary | ICD-10-CM | POA: Diagnosis not present

## 2016-06-25 DIAGNOSIS — Z8546 Personal history of malignant neoplasm of prostate: Secondary | ICD-10-CM | POA: Diagnosis not present

## 2016-06-25 DIAGNOSIS — Z96642 Presence of left artificial hip joint: Secondary | ICD-10-CM | POA: Diagnosis not present

## 2016-06-25 DIAGNOSIS — Z452 Encounter for adjustment and management of vascular access device: Secondary | ICD-10-CM | POA: Diagnosis not present

## 2016-06-25 DIAGNOSIS — M1711 Unilateral primary osteoarthritis, right knee: Secondary | ICD-10-CM | POA: Diagnosis not present

## 2016-06-25 DIAGNOSIS — M1712 Unilateral primary osteoarthritis, left knee: Secondary | ICD-10-CM | POA: Diagnosis not present

## 2016-06-25 DIAGNOSIS — Z7901 Long term (current) use of anticoagulants: Secondary | ICD-10-CM | POA: Diagnosis not present

## 2016-06-25 DIAGNOSIS — Z792 Long term (current) use of antibiotics: Secondary | ICD-10-CM | POA: Diagnosis not present

## 2016-06-25 DIAGNOSIS — T84011A Broken internal left hip prosthesis, initial encounter: Secondary | ICD-10-CM | POA: Diagnosis not present

## 2016-06-25 DIAGNOSIS — E669 Obesity, unspecified: Secondary | ICD-10-CM | POA: Diagnosis not present

## 2016-06-25 DIAGNOSIS — I872 Venous insufficiency (chronic) (peripheral): Secondary | ICD-10-CM | POA: Diagnosis not present

## 2016-06-25 DIAGNOSIS — M4806 Spinal stenosis, lumbar region: Secondary | ICD-10-CM | POA: Diagnosis not present

## 2016-06-25 DIAGNOSIS — G629 Polyneuropathy, unspecified: Secondary | ICD-10-CM | POA: Diagnosis not present

## 2016-06-27 ENCOUNTER — Telehealth: Payer: Self-pay | Admitting: Family Medicine

## 2016-06-27 DIAGNOSIS — Z96642 Presence of left artificial hip joint: Secondary | ICD-10-CM | POA: Diagnosis not present

## 2016-06-27 DIAGNOSIS — Z7901 Long term (current) use of anticoagulants: Secondary | ICD-10-CM | POA: Diagnosis not present

## 2016-06-27 DIAGNOSIS — Z452 Encounter for adjustment and management of vascular access device: Secondary | ICD-10-CM | POA: Diagnosis not present

## 2016-06-27 DIAGNOSIS — Z792 Long term (current) use of antibiotics: Secondary | ICD-10-CM | POA: Diagnosis not present

## 2016-06-27 DIAGNOSIS — M1711 Unilateral primary osteoarthritis, right knee: Secondary | ICD-10-CM | POA: Diagnosis not present

## 2016-06-27 DIAGNOSIS — T84011A Broken internal left hip prosthesis, initial encounter: Secondary | ICD-10-CM | POA: Diagnosis not present

## 2016-06-27 DIAGNOSIS — M1712 Unilateral primary osteoarthritis, left knee: Secondary | ICD-10-CM | POA: Diagnosis not present

## 2016-06-27 DIAGNOSIS — G629 Polyneuropathy, unspecified: Secondary | ICD-10-CM | POA: Diagnosis not present

## 2016-06-27 DIAGNOSIS — E669 Obesity, unspecified: Secondary | ICD-10-CM | POA: Diagnosis not present

## 2016-06-27 DIAGNOSIS — I872 Venous insufficiency (chronic) (peripheral): Secondary | ICD-10-CM | POA: Diagnosis not present

## 2016-06-27 DIAGNOSIS — M4806 Spinal stenosis, lumbar region: Secondary | ICD-10-CM | POA: Diagnosis not present

## 2016-06-27 DIAGNOSIS — Z8546 Personal history of malignant neoplasm of prostate: Secondary | ICD-10-CM | POA: Diagnosis not present

## 2016-06-27 NOTE — Telephone Encounter (Signed)
Patient's wife notified of directions to stop antibiotic and verbally understood.

## 2016-06-27 NOTE — Telephone Encounter (Signed)
OK to stop abx. Call if urinary sx's return soon after stopping. I will follow him regarding his DVT.

## 2016-06-27 NOTE — Telephone Encounter (Signed)
Wife states that he is having bowel trouble on the Septa and has taken 6 days worth. Urination frequency has improved and they want to know if he can stop the medication.  Also wanting to know if after the PICC line is removed if you will be monitoring his blood clot potential.

## 2016-07-01 DIAGNOSIS — Z7901 Long term (current) use of anticoagulants: Secondary | ICD-10-CM | POA: Diagnosis not present

## 2016-07-01 DIAGNOSIS — Z792 Long term (current) use of antibiotics: Secondary | ICD-10-CM | POA: Diagnosis not present

## 2016-07-01 DIAGNOSIS — Z452 Encounter for adjustment and management of vascular access device: Secondary | ICD-10-CM | POA: Diagnosis not present

## 2016-07-01 DIAGNOSIS — Z8546 Personal history of malignant neoplasm of prostate: Secondary | ICD-10-CM | POA: Diagnosis not present

## 2016-07-01 DIAGNOSIS — Z96642 Presence of left artificial hip joint: Secondary | ICD-10-CM | POA: Diagnosis not present

## 2016-07-01 DIAGNOSIS — T84011A Broken internal left hip prosthesis, initial encounter: Secondary | ICD-10-CM | POA: Diagnosis not present

## 2016-07-02 DIAGNOSIS — M1712 Unilateral primary osteoarthritis, left knee: Secondary | ICD-10-CM | POA: Diagnosis not present

## 2016-07-02 DIAGNOSIS — M1711 Unilateral primary osteoarthritis, right knee: Secondary | ICD-10-CM | POA: Diagnosis not present

## 2016-07-02 DIAGNOSIS — G629 Polyneuropathy, unspecified: Secondary | ICD-10-CM | POA: Diagnosis not present

## 2016-07-02 DIAGNOSIS — T84011A Broken internal left hip prosthesis, initial encounter: Secondary | ICD-10-CM | POA: Diagnosis not present

## 2016-07-02 DIAGNOSIS — I872 Venous insufficiency (chronic) (peripheral): Secondary | ICD-10-CM | POA: Diagnosis not present

## 2016-07-02 DIAGNOSIS — Z452 Encounter for adjustment and management of vascular access device: Secondary | ICD-10-CM | POA: Diagnosis not present

## 2016-07-02 DIAGNOSIS — Z792 Long term (current) use of antibiotics: Secondary | ICD-10-CM | POA: Diagnosis not present

## 2016-07-02 DIAGNOSIS — Z8546 Personal history of malignant neoplasm of prostate: Secondary | ICD-10-CM | POA: Diagnosis not present

## 2016-07-02 DIAGNOSIS — Z7901 Long term (current) use of anticoagulants: Secondary | ICD-10-CM | POA: Diagnosis not present

## 2016-07-02 DIAGNOSIS — E669 Obesity, unspecified: Secondary | ICD-10-CM | POA: Diagnosis not present

## 2016-07-02 DIAGNOSIS — Z96642 Presence of left artificial hip joint: Secondary | ICD-10-CM | POA: Diagnosis not present

## 2016-07-02 DIAGNOSIS — M4806 Spinal stenosis, lumbar region: Secondary | ICD-10-CM | POA: Diagnosis not present

## 2016-07-03 ENCOUNTER — Telehealth: Payer: Self-pay | Admitting: Family Medicine

## 2016-07-03 MED ORDER — RIVAROXABAN 20 MG PO TABS: 20.0000 mg | ORAL_TABLET | Freq: Every day | ORAL | 3 refills | Status: DC

## 2016-07-03 NOTE — Telephone Encounter (Signed)
Called the patient to let him know:

## 2016-07-03 NOTE — Telephone Encounter (Signed)
Xarelto eRx'd.

## 2016-07-03 NOTE — Telephone Encounter (Signed)
Patient called and asked that a refill be done for Xarelto. States that he is taking 15 mg for the first 21 days twice a day and then starting day 22 he is to take once daily 20 mg. Patient also stated that per instructions he needed to contact PCP to have this refilled. Please advise.

## 2016-07-09 ENCOUNTER — Telehealth: Payer: Self-pay

## 2016-07-09 DIAGNOSIS — Z792 Long term (current) use of antibiotics: Secondary | ICD-10-CM | POA: Diagnosis not present

## 2016-07-09 DIAGNOSIS — Z452 Encounter for adjustment and management of vascular access device: Secondary | ICD-10-CM | POA: Diagnosis not present

## 2016-07-09 DIAGNOSIS — T84011A Broken internal left hip prosthesis, initial encounter: Secondary | ICD-10-CM | POA: Diagnosis not present

## 2016-07-09 DIAGNOSIS — Z96642 Presence of left artificial hip joint: Secondary | ICD-10-CM | POA: Diagnosis not present

## 2016-07-09 DIAGNOSIS — Z7901 Long term (current) use of anticoagulants: Secondary | ICD-10-CM | POA: Diagnosis not present

## 2016-07-09 DIAGNOSIS — Z8546 Personal history of malignant neoplasm of prostate: Secondary | ICD-10-CM | POA: Diagnosis not present

## 2016-07-09 NOTE — Telephone Encounter (Signed)
No office visit needed.

## 2016-07-09 NOTE — Telephone Encounter (Signed)
Patient is going on trip to [place], He is asking if he needs to come in for OV before trip due to him being on Xarelto for blood clot.

## 2016-07-09 NOTE — Telephone Encounter (Signed)
Spoke to patient wife and she would feel better if he saw MD before leaving next month.  Appt will be scheduled.

## 2016-07-10 ENCOUNTER — Ambulatory Visit (INDEPENDENT_AMBULATORY_CARE_PROVIDER_SITE_OTHER): Payer: PPO | Admitting: Orthopaedic Surgery

## 2016-07-10 DIAGNOSIS — Z96642 Presence of left artificial hip joint: Secondary | ICD-10-CM

## 2016-07-10 DIAGNOSIS — T84051A Periprosthetic osteolysis of internal prosthetic left hip joint, initial encounter: Secondary | ICD-10-CM

## 2016-07-10 DIAGNOSIS — M25552 Pain in left hip: Secondary | ICD-10-CM

## 2016-07-11 ENCOUNTER — Ambulatory Visit (INDEPENDENT_AMBULATORY_CARE_PROVIDER_SITE_OTHER): Payer: PPO | Admitting: Family Medicine

## 2016-07-11 ENCOUNTER — Encounter: Payer: Self-pay | Admitting: Family Medicine

## 2016-07-11 VITALS — BP 136/64 | HR 66 | Temp 98.0°F | Resp 16 | Wt 235.0 lb

## 2016-07-11 DIAGNOSIS — Z452 Encounter for adjustment and management of vascular access device: Secondary | ICD-10-CM | POA: Diagnosis not present

## 2016-07-11 DIAGNOSIS — Z792 Long term (current) use of antibiotics: Secondary | ICD-10-CM | POA: Diagnosis not present

## 2016-07-11 DIAGNOSIS — D62 Acute posthemorrhagic anemia: Secondary | ICD-10-CM | POA: Diagnosis not present

## 2016-07-11 DIAGNOSIS — Z7901 Long term (current) use of anticoagulants: Secondary | ICD-10-CM | POA: Diagnosis not present

## 2016-07-11 DIAGNOSIS — Z96642 Presence of left artificial hip joint: Secondary | ICD-10-CM | POA: Diagnosis not present

## 2016-07-11 DIAGNOSIS — Z8546 Personal history of malignant neoplasm of prostate: Secondary | ICD-10-CM | POA: Diagnosis not present

## 2016-07-11 DIAGNOSIS — T84011A Broken internal left hip prosthesis, initial encounter: Secondary | ICD-10-CM | POA: Diagnosis not present

## 2016-07-11 DIAGNOSIS — Z23 Encounter for immunization: Secondary | ICD-10-CM | POA: Diagnosis not present

## 2016-07-11 DIAGNOSIS — I82413 Acute embolism and thrombosis of femoral vein, bilateral: Secondary | ICD-10-CM | POA: Diagnosis not present

## 2016-07-11 NOTE — Progress Notes (Signed)
Pre visit review using our clinic review tool, if applicable. No additional management support is needed unless otherwise documented below in the visit note. 

## 2016-07-11 NOTE — Progress Notes (Signed)
OFFICE VISIT  07/11/2016   CC:  Chief Complaint  Patient presents with  . Follow-up    blood clot   HPI:    Patient is a 70 y.o. Caucasian male who presents for f/u bilat LE DVTs. He had extensive bilat LE DVTs dx'd by LE doppler on 06/14/16 in the setting of post-op from left anterior hip acetabular revision.  He was started on xarelto in the ED and has finished his 21d of 15mg  bid dosing and has been 20mg  qd dosing for 5d now.   No pain in legs at all now.  Says swelling has returned to his baseline mild chronic venous insufficiency edema.  No melena, hematochezia, gross hematuria, or bleeding/bruising of any kind has been noted.  Prior to getting dx'd with his DVTs, while in hosp for hip surgery he did have to have a transfusion for post op blood loss anemia.  [facility] nursing did f/u Hb check yesterday and his orthopedist said it was a good result and he does not have to f/u with ortho anymore.   He also had staph bacteremia while in hosp and was d/c'd home on 4 wks of ancef IV, and just got his PICC out and started oral abx today per Dr. Trevor Mace orders.  He has no complaints today.  He will be traveling to [place] in November.  He will not be doing anything that would expose him to potential trauma.    Past Medical History:  Diagnosis Date  . Anterior dislocation of right shoulder 09/2013   Reduced in ED under sedation.  Dislocated posteriorly 2015, ? partial RC tear, has f/u planned with Dr. Marlou Sa--? surgery? possible plan for 2016.  [name] Blood donor, whole blood    Has donated approx 30 gallons in his lifetime  . Chronic renal insufficiency, stage II (mild)   . Chronic venous insufficiency    +compression hose  . DDD (degenerative disc disease), lumbar 2017   Dr. Noe Gens ortho.  He is now wearing a new leg brace.  Has had ESI and will get another.  [name] DVT (deep venous thrombosis) ([place]) 06/14/2016   bilateral LL's; in post-op setting s/p hip surgery.  Dx'd in the ED, where  xarelto was rx'd.  . Foot drop, left    s/p hip fracture 1969  . Hx of adenomatous colonic polyps    polypectomy 2007; 2012; 2015 (+adenomatous, no high grade dysplasia); recall 5 yrs  . Osteoarthritis    knees and hips primarily  . 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  As of urol f/u 08/2014, plan is for me to follow PSAs and fax results to Dr. Junious Silk: PSA 02/2015 was 0.21 (stable).  Stable on f/u with Dr. Junious Silk 08/2015.  . Toe infection 05/27/2013   Left great toe and right 2nd toe  . Urge incontinence    vesicare helpful    Past Surgical History:  Procedure Laterality Date  . ANTERIOR HIP REVISION Left 06/07/2016   Procedure: LEFT ANTERIOR HIP ACETABULAR REVISION;  Surgeon: Mcarthur Rossetti, MD;  Location: [facility];  Service: Orthopedics;  Laterality: Left;  . arthroscopy left shoulder Left 07  . bilateral carpal tunnel release  2003  . BILATERAL CARPAL TUNNEL RELEASE    . COLONOSCOPY  2007;2012;2015   04/12/2014 Tubular adenoma x 1; per Dr. Henrene Pastor recall 5 yrs  . FRACTURE SURGERY Left   . JOINT REPLACEMENT Left  hip  . LUMBAR LAMINECTOMY/DECOMPRESSION MICRODISCECTOMY Left 03/28/2016   Procedure: Left Lumbar three-four Laminoforaminotomy;  Surgeon: Erline Levine, MD;  Location: [facility];  Service: Neurosurgery;  Laterality: Left;  left  . RADIOACTIVE SEED IMPLANT  2012  . ROTATOR CUFF REPAIR  2006   right  . seed implant prostate  2012  . SHOULDER ARTHROSCOPY Left   . TOTAL HIP ARTHROPLASTY  2008   left (cobalt chrome)  . VASECTOMY      Outpatient Medications Prior to Visit  Medication Sig Dispense Refill  . rivaroxaban (XARELTO) 20 MG TABS tablet Take 1 tablet (20 mg total) by mouth daily with supper. 30 tablet 3  . solifenacin (VESICARE) 5 MG tablet Take 5 mg by mouth daily.    [name] ceFAZolin (ANCEF) 1 GM/50ML Inject 50 mLs (1 g total) into the vein every 8 (eight) hours. (Patient not  taking: Reported on 07/11/2016) 4500 mL 0  . oxyCODONE-acetaminophen (ROXICET) 5-325 MG tablet Take 1-2 tablets by mouth every 4 (four) hours as needed for severe pain. (Patient not taking: Reported on 07/11/2016) 90 tablet 0  . sulfamethoxazole-trimethoprim (BACTRIM DS,SEPTRA DS) 800-160 MG tablet Take 1 tablet by mouth 2 (two) times daily. (Patient not taking: Reported on 07/11/2016) 28 tablet 0  . tiZANidine (ZANAFLEX) 2 MG tablet Take 1-2 tablets (2-4 mg total) by mouth every 6 (six) hours as needed for muscle spasms. (Patient not taking: Reported on 07/11/2016) 60 tablet 0  . traMADol (ULTRAM) 50 MG tablet Take 50 mg by mouth every 8 (eight) hours as needed for moderate pain.   0   No facility-administered medications prior to visit.     No Known Allergies  ROS As per HPI  PE: Blood pressure 136/64, pulse 66, temperature 98 F (36.7 C), temperature source Oral, resp. rate 16, weight 235 lb (106.6 kg), SpO2 96 %. Gen: Alert, well appearing.  Patient is oriented to person, place, time, and situation. [id number]: no injection, icteris, swelling, or exudate.  EOMI, PERRLA. Mouth: lips without lesion/swelling.  Oral mucosa pink and moist. Oropharynx without erythema, exudate, or swelling.  CV: RRR, no m/r/g.   LUNGS: CTA bilat, nonlabored resps, good aeration in all lung fields. EXT: no clubbing or cyanosis.  He has 1-2+ pitting edema in both LL's, R a bit worse than L. Has orthotic on L LL for his foot drop.  No leg tenderness.  LABS:  None today  Recent imaging:  LE venous dopplers 06/14/16: Extensive bilateral lower extremity DVTs with proximal extension to the common femoral vein on the left and femoral vein on the right.  IMPRESSION AND PLAN:  1) Bilateral LE DVTs: he'll need to be on xarelto for 6 months and then we'll recheck LE venous doppler at that time.  If no DVT, then the plan would be to continue 1 additional year of low dose (10mg ) xarelto as strategy to prevent recurrence.    Signs/symptoms to call or return for were reviewed and pt expressed understanding.  2) Post-op blood loss anemia, s/p transfusion of 1 unit of pRBCs: per pt's report his f/u Hb yesterday was normal and his orthopedist told him no further testing was needed.  Flu vaccine given today.  FOLLOW UP: Return in about 6 months (around 01/09/2017), or f/u DVT (30 min).  Signed:  Crissie Sickles, MD           07/11/2016

## 2016-07-12 DIAGNOSIS — Z7901 Long term (current) use of anticoagulants: Secondary | ICD-10-CM | POA: Diagnosis not present

## 2016-07-12 DIAGNOSIS — M1711 Unilateral primary osteoarthritis, right knee: Secondary | ICD-10-CM | POA: Diagnosis not present

## 2016-07-12 DIAGNOSIS — G629 Polyneuropathy, unspecified: Secondary | ICD-10-CM | POA: Diagnosis not present

## 2016-07-12 DIAGNOSIS — M1712 Unilateral primary osteoarthritis, left knee: Secondary | ICD-10-CM | POA: Diagnosis not present

## 2016-07-12 DIAGNOSIS — Z452 Encounter for adjustment and management of vascular access device: Secondary | ICD-10-CM | POA: Diagnosis not present

## 2016-07-12 DIAGNOSIS — I872 Venous insufficiency (chronic) (peripheral): Secondary | ICD-10-CM | POA: Diagnosis not present

## 2016-07-12 DIAGNOSIS — Z8546 Personal history of malignant neoplasm of prostate: Secondary | ICD-10-CM | POA: Diagnosis not present

## 2016-07-12 DIAGNOSIS — Z96642 Presence of left artificial hip joint: Secondary | ICD-10-CM | POA: Diagnosis not present

## 2016-07-12 DIAGNOSIS — Z792 Long term (current) use of antibiotics: Secondary | ICD-10-CM | POA: Diagnosis not present

## 2016-07-12 DIAGNOSIS — E669 Obesity, unspecified: Secondary | ICD-10-CM | POA: Diagnosis not present

## 2016-07-12 DIAGNOSIS — T84011A Broken internal left hip prosthesis, initial encounter: Secondary | ICD-10-CM | POA: Diagnosis not present

## 2016-08-13 ENCOUNTER — Ambulatory Visit (INDEPENDENT_AMBULATORY_CARE_PROVIDER_SITE_OTHER): Payer: PPO | Admitting: Orthopaedic Surgery

## 2016-08-13 ENCOUNTER — Ambulatory Visit (INDEPENDENT_AMBULATORY_CARE_PROVIDER_SITE_OTHER): Payer: PPO

## 2016-08-13 DIAGNOSIS — Z96642 Presence of left artificial hip joint: Secondary | ICD-10-CM

## 2016-08-13 DIAGNOSIS — Z96649 Presence of unspecified artificial hip joint: Secondary | ICD-10-CM

## 2016-08-13 MED ORDER — METHYLPREDNISOLONE 4 MG PO TABS: ORAL_TABLET | ORAL | 0 refills | Status: DC

## 2016-08-13 MED ORDER — DICLOFENAC SODIUM 1 % TD GEL: 2.0000 g | Freq: Four times a day (QID) | TRANSDERMAL | 3 refills | Status: DC

## 2016-08-13 NOTE — Progress Notes (Signed)
Office Visit Note   Patient: [name]           Date of Birth: 10/25/1945           MRN: [id number] Visit Date: 08/13/2016              Requested by: Tammi Sou, MD [address] [place], [place] [address] PCP: Tammi Sou, MD   Assessment & Plan: Visit Diagnoses:  1. Presence of left artificial hip joint   2. S/P revision of total hip     Plan:  He is on Xarelto due to blood clot history. We'll try some topical anti-inflammatories over his left hip trochanteric area to see if this will help calm down the symptoms. I also put him on a steroid taper. We'll see him back in 4 weeks to see how he is doing overall but no x-rays are needed.  Follow-Up Instructions: Return in about 4 weeks (around 09/10/2016).   Orders:  Orders Placed This Encounter  Procedures  . XR HIP UNILAT W OR W/O PELVIS 1V LEFT   Meds ordered this encounter  Medications  . diclofenac sodium (VOLTAREN) 1 % GEL    Sig: Apply 2 g topically 4 (four) times daily.    Dispense:  100 g    Refill:  3  . methylPREDNISolone (MEDROL) 4 MG tablet    Sig: Medrol dose pack. Take as instructed    Dispense:  21 tablet    Refill:  0      Procedures: No procedures performed   Clinical Data: No additional findings.   Subjective: Chief Complaint  Patient presents with  . Left Hip - Pain    06/07/16 Left Hip Revision Patient states he has had quite a bit of pain recently.   He has been doing well overall. He points to the left hip greater trochanteric area of the source of his pain. He is ambulating with a walking stick. He and his wife are heading to [place] in a week for vacation. HPI  Review of Systems   Objective: Vital Signs: There were no vitals taken for this visit.  Physical Exam  Ortho Exam He has fluid range of motion of his left hip with pain only over the tip of the greater trochanter. Specialty Comments:  No specialty comments available.  Imaging: Xr Hip Unilat W  Or W/o Pelvis 1v Left  Result Date: 08/13/2016 An AP pelvis and lateral of his left operative hip show well seated implant with no complicating features.    PMFS History: Patient Active Problem List   Diagnosis Date Noted  . Encounter for surveillance of recalled total hip arthroplasty hardware ([place]) 06/07/2016  . Status post revision of total hip replacement 06/07/2016  . 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 Red blood cell abnormality 02/11/2016  . Elevated blood pressure reading without diagnosis of hypertension 07/01/2013  . Peripheral neuropathy ([place]) 06/02/2013  . Health maintenance examination 11/26/2011  . ELEVATED PROSTATE SPECIFIC ANTIGEN 05/21/2010  . Obesity, Class I, BMI 30-34.9 05/18/2010  . VENOUS INSUFFICIENCY, CHRONIC 05/18/2010  . OSTEOARTHRITIS, KNEES, BILATERAL 05/18/2010   Past Medical History:  Diagnosis Date  . Anterior dislocation of right shoulder 09/2013   Reduced in ED under sedation.  Dislocated posteriorly 2015, ? partial RC tear, has f/u planned with Dr. Marlou Sa--? surgery? possible plan for 2016.  [name] Blood donor, whole blood    Has donated approx 30 gallons in his lifetime  .  Chronic renal insufficiency, stage II (mild)   . Chronic venous insufficiency    +compression hose  . DDD (degenerative disc disease), lumbar 2017   Dr. Noe Gens ortho.  He is now wearing a new leg brace.  Has had ESI and will get another.  [name] DVT (deep venous thrombosis) ([place]) 06/14/2016   bilateral LL's; in post-op setting s/p hip surgery.  Dx'd in the ED, where xarelto was rx'd.  . Foot drop, left    s/p hip fracture 1969  . Hx of adenomatous colonic polyps    polypectomy 2007; 2012; 2015 (+adenomatous, no high grade dysplasia); recall 5 yrs  . Osteoarthritis    knees and hips primarily  . 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  As of urol f/u 08/2014,  plan is for me to follow PSAs and fax results to Dr. Junious Silk: PSA 02/2015 was 0.21 (stable).  Stable on f/u with Dr. Junious Silk 08/2015.  . Toe infection 05/27/2013   Left great toe and right 2nd toe  . Urge incontinence    vesicare helpful    Family History  Problem Relation Age of Onset  . Lung cancer Mother   . Heart disease Father     Past Surgical History:  Procedure Laterality Date  . ANTERIOR HIP REVISION Left 06/07/2016   Procedure: LEFT ANTERIOR HIP ACETABULAR REVISION;  Surgeon: Mcarthur Rossetti, MD;  Location: [facility];  Service: Orthopedics;  Laterality: Left;  . arthroscopy left shoulder Left 07  . bilateral carpal tunnel release  2003  . BILATERAL CARPAL TUNNEL RELEASE    . COLONOSCOPY  2007;2012;2015   04/12/2014 Tubular adenoma x 1; per Dr. Henrene Pastor recall 5 yrs  . FRACTURE SURGERY Left   . JOINT REPLACEMENT Left    hip  . LUMBAR LAMINECTOMY/DECOMPRESSION MICRODISCECTOMY Left 03/28/2016   Procedure: Left Lumbar three-four Laminoforaminotomy;  Surgeon: Erline Levine, MD;  Location: [facility];  Service: Neurosurgery;  Laterality: Left;  left  . RADIOACTIVE SEED IMPLANT  2012  . ROTATOR CUFF REPAIR  2006   right  . seed implant prostate  2012  . SHOULDER ARTHROSCOPY Left   . TOTAL HIP ARTHROPLASTY  2008   left (cobalt chrome)  . VASECTOMY     Social History   Occupational History  . Not on file.   Social History Main Topics  . Smoking status: Never Smoker  . Smokeless tobacco: Never Used  . Alcohol use No  . Drug use: No  . Sexual activity: Not on file

## 2016-09-03 ENCOUNTER — Telehealth (INDEPENDENT_AMBULATORY_CARE_PROVIDER_SITE_OTHER): Payer: Self-pay | Admitting: Orthopaedic Surgery

## 2016-09-03 MED ORDER — OXYCODONE-ACETAMINOPHEN 5-325 MG PO TABS: 1.0000 | ORAL_TABLET | ORAL | 0 refills | Status: DC | PRN

## 2016-09-03 NOTE — Telephone Encounter (Signed)
Patient aware Rx ready at front desk  

## 2016-09-03 NOTE — Telephone Encounter (Signed)
Appointment or medication?

## 2016-09-03 NOTE — Telephone Encounter (Signed)
Patient's wife calling regarding husband's L hip pain. They have returned from a trip to [place] on Sunday night. Patient's pain level is high and not able to put any  weight on it.  He was taking the oxycodone to get thru the trip and now that they are home he is taking the aspirin, but not managing the pain.  Advice needed on continuation of pain killer .  Patient has blood clots in both legs.  Please advise ASAP.  He was holding out for injection appt on Dec 5th, but pain is ACUTE.

## 2016-09-03 NOTE — Telephone Encounter (Signed)
They can come pick up a percocet script and then stay off the hip or limit activities for a few days.

## 2016-09-10 ENCOUNTER — Encounter: Payer: Self-pay | Admitting: Family Medicine

## 2016-09-10 ENCOUNTER — Ambulatory Visit (INDEPENDENT_AMBULATORY_CARE_PROVIDER_SITE_OTHER): Payer: PPO | Admitting: Physician Assistant

## 2016-09-10 DIAGNOSIS — Z9889 Other specified postprocedural states: Secondary | ICD-10-CM

## 2016-09-10 DIAGNOSIS — M7062 Trochanteric bursitis, left hip: Secondary | ICD-10-CM | POA: Diagnosis not present

## 2016-09-10 DIAGNOSIS — Z96642 Presence of left artificial hip joint: Secondary | ICD-10-CM

## 2016-09-10 MED ORDER — METHYLPREDNISOLONE ACETATE 40 MG/ML IJ SUSP
40.0000 mg | INTRAMUSCULAR | Status: AC | PRN
  Administered 2016-09-10: 40 mg via INTRA_ARTICULAR

## 2016-09-10 MED ORDER — LIDOCAINE HCL 1 % IJ SOLN
3.0000 mL | INTRAMUSCULAR | Status: AC | PRN
  Administered 2016-09-10: 3 mL

## 2016-09-10 NOTE — Progress Notes (Signed)
Office Visit Note   Patient: [name]           Date of Birth: May 02, 1946           MRN: [id number] Visit Date: 09/10/2016              Requested by: Joseph Sou, MD [address] [place], [place] [address] PCP: Joseph Sou, MD   Assessment & Plan: Visit Diagnoses:  1. History of arthroplasty of left hip   2. Trochanteric bursitis, left hip     Plan: IT band stretching. We'll see him back in 1 month to check his progress lack of  Follow-Up Instructions: Return in about 4 weeks (around 10/08/2016).   Orders:  Orders Placed This Encounter  Procedures  . Large Joint Injection/Arthrocentesis   No orders of the defined types were placed in this encounter.     Procedures: Large Joint Inj Date/Time: 09/10/2016 9:18 AM Performed by: [name] Authorized by: [name]   Consent Given by:  Patient Indications:  Pain Location:  Hip Site:  L greater trochanter Approach:  Lateral Ultrasound Guidance: No   Fluoroscopic Guidance: No   Arthrogram: No   Medications:  3 mL lidocaine 1 %; 40 mg methylPREDNISolone acetate 40 MG/ML Aspiration Attempted: No   Patient tolerance:  Patient tolerated the procedure well with no immediate complications     Clinical Data: No additional findings.   Subjective: Chief Complaint  Patient presents with  . Left Hip - Follow-up    Patient states he is having a lot of pain since his trip to [place]. He did a lot of walking there. States just really sore.    HPI  [name] returns today status post left total hip arthroplasty 07/17/2016. He overall is feeling he has overdone it while in [place]. Back on a walker having pain in the lateral aspect of the left hip. No injury. He remains on Xarelto or treatment of bilateral DVTs  Review of Systems See HPI  Objective: Vital Signs: There were no vitals taken for this visit.  Physical Exam  Constitutional: He is oriented to person, place, and time. He  appears well-developed and well-nourished. No distress.  Pulmonary/Chest: Effort normal.  Neurological: He is alert and oriented to person, place, and time.  Psychiatric: He has a normal mood and affect.    Ortho Exam Left hip surgical incision is healing well no sign of a seroma. Tenderness over the left trochanteric region. Fluid range of motion of the hip without significant pain Specialty Comments:  No specialty comments available.  Imaging: No results found.   PMFS History: Patient Active Problem List   Diagnosis Date Noted  . Encounter for surveillance of recalled total hip arthroplasty hardware ([place]) 06/07/2016  . Status post revision of total hip replacement 06/07/2016  . 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 Red blood cell abnormality 02/11/2016  . Elevated blood pressure reading without diagnosis of hypertension 07/01/2013  . Peripheral neuropathy ([place]) 06/02/2013  . Health maintenance examination 11/26/2011  . ELEVATED PROSTATE SPECIFIC ANTIGEN 05/21/2010  . Obesity, Class I, BMI 30-34.9 05/18/2010  . VENOUS INSUFFICIENCY, CHRONIC 05/18/2010  . OSTEOARTHRITIS, KNEES, BILATERAL 05/18/2010   Past Medical History:  Diagnosis Date  . Anterior dislocation of right shoulder 09/2013   Reduced in ED under sedation.  Dislocated posteriorly 2015, ? partial RC tear, has f/u planned with [name]--? surgery? possible plan for 2016.  [name]  Blood donor, whole blood    Has donated approx 30 gallons in his lifetime  . Chronic renal insufficiency, stage II (mild)   . Chronic venous insufficiency    +compression hose  . DDD (degenerative disc disease), lumbar 2017   [name] ortho.  He is now wearing a new leg brace.  Has had ESI and will get another.  [name] DVT (deep venous thrombosis) ([place]) 06/14/2016   bilateral LL's; in post-op setting s/p hip surgery.  Dx'd in the ED, where xarelto was rx'd.  . Foot drop, left    s/p hip fracture 1969    . Hx of adenomatous colonic polyps    polypectomy 2007; 2012; 2015 (+adenomatous, no high grade dysplasia); recall 5 yrs  . Osteoarthritis    knees and hips primarily  . 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  As of urol f/u 08/2014, plan is for me to follow PSAs and fax results to [name]: PSA 02/2015 was 0.21 (stable).  Stable on f/u with [name] 08/2015.  . Toe infection 05/27/2013   Left great toe and right 2nd toe  . Urge incontinence    vesicare helpful    Family History  Problem Relation Age of Onset  . Lung cancer Mother   . Heart disease Father     Past Surgical History:  Procedure Laterality Date  . ANTERIOR HIP REVISION Left 06/07/2016   Procedure: LEFT ANTERIOR HIP ACETABULAR REVISION;  Surgeon: Joseph Rossetti, MD;  [facility];  Service: Orthopedics;  Laterality: Left;  . arthroscopy left shoulder Left 07  . bilateral carpal tunnel release  2003  . BILATERAL CARPAL TUNNEL RELEASE    . COLONOSCOPY  2007;2012;2015   04/12/2014 Tubular adenoma x 1; per [name] recall 5 yrs  . FRACTURE SURGERY Left   . JOINT REPLACEMENT Left    hip  . LUMBAR LAMINECTOMY/DECOMPRESSION MICRODISCECTOMY Left 03/28/2016   Procedure: Left Lumbar three-four Laminoforaminotomy;  Surgeon: Joseph Levine, MD;  Location: [facility];  Service: Neurosurgery;  Laterality: Left;  left  . RADIOACTIVE SEED IMPLANT  2012  . ROTATOR CUFF REPAIR  2006   right  . seed implant prostate  2012  . SHOULDER ARTHROSCOPY Left   . TOTAL HIP ARTHROPLASTY  2008   left (cobalt chrome)  . VASECTOMY     Social History   Occupational History  . Not on file.   Social History Main Topics  . Smoking status: Never Smoker  . Smokeless tobacco: Never Used  . Alcohol use No  . Drug use: No  . Sexual activity: Not on file

## 2016-10-08 ENCOUNTER — Ambulatory Visit (INDEPENDENT_AMBULATORY_CARE_PROVIDER_SITE_OTHER): Payer: PPO

## 2016-10-08 ENCOUNTER — Ambulatory Visit (INDEPENDENT_AMBULATORY_CARE_PROVIDER_SITE_OTHER): Payer: PPO | Admitting: Orthopaedic Surgery

## 2016-10-08 DIAGNOSIS — M25552 Pain in left hip: Secondary | ICD-10-CM

## 2016-10-08 MED ORDER — METHYLPREDNISOLONE 4 MG PO TABS: ORAL_TABLET | ORAL | 0 refills | Status: DC

## 2016-10-08 NOTE — Progress Notes (Signed)
Office Visit Note   Patient: [name]           Date of Birth: November 22, 1945           MRN: [id number] Visit Date: 10/08/2016              Requested by: Tammi Sou, MD [address] [place], [place] [address] PCP: Tammi Sou, MD   Assessment & Plan: Visit Diagnoses:  1. Pain in left hip     Plan: He tolerated the trochanteric injection in his left hip well. I do feel that he needs to see Dr. Vertell Limber his neurosurgeon to rule out this being a component of sciatica. He still denies any groin pain. I don't feel this is related to his hip replacement hip revision however can't rule this out completely. I like see him back myself in 4 weeks' 0 is done with the trochanteric injection as well as a course of oral steroids. He can't take other anti-inflammatories since he is on a blood thinning medication.  Follow-Up Instructions: Return in about 4 weeks (around 11/05/2016).   Orders:  Orders Placed This Encounter  Procedures  . XR HIP UNILAT W OR W/O PELVIS 1V LEFT   Meds ordered this encounter  Medications  . methylPREDNISolone (MEDROL) 4 MG tablet    Sig: Medrol dose pack. Take as instructed    Dispense:  21 tablet    Refill:  0      Procedures: No procedures performed   Clinical Data: No additional findings.   Subjective: Chief Complaint  Patient presents with  . Left Hip - Follow-up    Patient states he is still having a lot of pain. Walking with a walker.   He points to mainly his trochanteric area and sciatic areas a source of his pain on the left side. He denies any groin pain.  HPI  Review of Systems   Objective: Vital Signs: There were no vitals taken for this visit.  Physical Exam He is alert and when 3. Ortho Exam I can easily put his hip on the left side through internal rotation rotation with no pain in the groin. His pain seems to be on the trochanteric area and sciatic area was laying on his side. Specialty Comments:  No  specialty comments available.  Imaging: Xr Hip Unilat W Or W/o Pelvis 1v Left  Result Date: 10/08/2016 An AP pelvis shows no, getting features of the implant itself but I can tell.    PMFS History: Patient Active Problem List   Diagnosis Date Noted  . Encounter for surveillance of recalled total hip arthroplasty hardware ([place]) 06/07/2016  . Status post revision of total hip replacement 06/07/2016  . 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 Red blood cell abnormality 02/11/2016  . Elevated blood pressure reading without diagnosis of hypertension 07/01/2013  . Peripheral neuropathy ([place]) 06/02/2013  . Health maintenance examination 11/26/2011  . ELEVATED PROSTATE SPECIFIC ANTIGEN 05/21/2010  . Obesity, Class I, BMI 30-34.9 05/18/2010  . VENOUS INSUFFICIENCY, CHRONIC 05/18/2010  . OSTEOARTHRITIS, KNEES, BILATERAL 05/18/2010   Past Medical History:  Diagnosis Date  . Anterior dislocation of right shoulder 09/2013   Reduced in ED under sedation.  Dislocated posteriorly 2015, ? partial RC tear, has f/u planned with Dr. Marlou Sa--? surgery? possible plan for 2016.  [name] Blood donor, whole blood    Has donated approx 30 gallons in his lifetime  . Chronic renal insufficiency, stage II (mild)   .  Chronic venous insufficiency    +compression hose  . DDD (degenerative disc disease), lumbar 2017   Dr. Noe Gens ortho.  He is now wearing a new leg brace.  Has had ESI and will get another.  [name] DVT (deep venous thrombosis) ([place]) 06/14/2016   bilateral LL's; in post-op setting s/p hip surgery.  Dx'd in the ED, where xarelto was rx'd.  . Foot drop, left    s/p hip fracture 1969  . Hx of adenomatous colonic polyps    polypectomy 2007; 2012; 2015 (+adenomatous, no high grade dysplasia); recall 5 yrs  . Osteoarthritis    knees and hips primarily  . Prostate cancer ([facility] [place]) 2012   Acinar cell carcinoma of the prostate 2011--ext beam radiation + radiation seed  implants.  As of urol f/u 08/2014, plan is for me to follow PSAs and fax results to Dr. Junious Silk: PSA 02/2015 was 0.21 (stable).  Stable on f/u with Dr. Junious Silk 08/2015.  . Toe infection 05/27/2013   Left great toe and right 2nd toe  . Trochanteric bursitis of left hip    Steroid injection by ortho 09/10/16  . Urge incontinence    vesicare helpful    Family History  Problem Relation Age of Onset  . Lung cancer Mother   . Heart disease Father     Past Surgical History:  Procedure Laterality Date  . ANTERIOR HIP REVISION Left 06/07/2016   Procedure: LEFT ANTERIOR HIP ACETABULAR REVISION;  Surgeon: Mcarthur Rossetti, MD;  [facility];  Service: Orthopedics;  Laterality: Left;  . arthroscopy left shoulder Left 07  . bilateral carpal tunnel release  2003  . BILATERAL CARPAL TUNNEL RELEASE    . COLONOSCOPY  2007;2012;2015   04/12/2014 Tubular adenoma x 1; per Dr. Henrene Pastor recall 5 yrs  . FRACTURE SURGERY Left   . JOINT REPLACEMENT Left    hip  . LUMBAR LAMINECTOMY/DECOMPRESSION MICRODISCECTOMY Left 03/28/2016   Procedure: Left Lumbar three-four Laminoforaminotomy;  Surgeon: Erline Levine, MD;  Location: [facility];  Service: Neurosurgery;  Laterality: Left;  left  . RADIOACTIVE SEED IMPLANT  2012  . ROTATOR CUFF REPAIR  2006   right  . seed implant prostate  2012  . SHOULDER ARTHROSCOPY Left   . TOTAL HIP ARTHROPLASTY  2008   left (cobalt chrome)  . VASECTOMY     Social History   Occupational History  . Not on file.   Social History Main Topics  . Smoking status: Never Smoker  . Smokeless tobacco: Never Used  . Alcohol use No  . Drug use: No  . Sexual activity: Not on file

## 2016-10-15 ENCOUNTER — Other Ambulatory Visit (INDEPENDENT_AMBULATORY_CARE_PROVIDER_SITE_OTHER): Payer: Self-pay

## 2016-10-15 ENCOUNTER — Telehealth (INDEPENDENT_AMBULATORY_CARE_PROVIDER_SITE_OTHER): Payer: Self-pay | Admitting: Orthopaedic Surgery

## 2016-10-15 DIAGNOSIS — M25552 Pain in left hip: Secondary | ICD-10-CM

## 2016-10-15 NOTE — Telephone Encounter (Signed)
Patient aware this is being scheduled

## 2016-10-15 NOTE — Telephone Encounter (Signed)
Patient called advised he had an injection on 10/08/16 and it lasted for 2 days and the pain is back again. Patient said he can not live like this and need something to help him. The number to contact patient is [phone]

## 2016-10-15 NOTE — Telephone Encounter (Signed)
Please order a MRI of his left hip to assess the implant and r/o infection or other reasons for his hip pain post left hip revision surgery

## 2016-10-15 NOTE — Telephone Encounter (Signed)
Please advise 

## 2016-10-21 ENCOUNTER — Ambulatory Visit
Admission: RE | Admit: 2016-10-21 | Discharge: 2016-10-21 | Disposition: A | Discharge status: Home / Self Care | Payer: PPO | Source: Ambulatory Visit | Attending: Orthopaedic Surgery | Admitting: Orthopaedic Surgery

## 2016-10-21 DIAGNOSIS — M25552 Pain in left hip: Secondary | ICD-10-CM

## 2016-10-22 ENCOUNTER — Telehealth (INDEPENDENT_AMBULATORY_CARE_PROVIDER_SITE_OTHER): Payer: Self-pay | Admitting: Orthopaedic Surgery

## 2016-10-22 NOTE — Telephone Encounter (Signed)
Please advise 

## 2016-10-22 NOTE — Telephone Encounter (Signed)
Pt called to let you know he had an mri on his hip yesterday and wants to know if dr Ninfa Linden can get the info he needs from this because they only done hip mri, he stated he wanted back image done too but they didn't have order so he wants to make sure if he needs to go again to get a back image done or if just the hip would be sufficient.   [phone]

## 2016-10-25 NOTE — Telephone Encounter (Signed)
Patient aware of below message

## 2016-10-25 NOTE — Telephone Encounter (Signed)
We will see him back to go over it and re-examine his hip

## 2016-11-03 ENCOUNTER — Other Ambulatory Visit: Payer: Self-pay | Admitting: Family Medicine

## 2016-11-04 NOTE — Telephone Encounter (Signed)
RF request for xarelto LOV: 07/11/16 Next ov: 01/09/17 Last written: 07/03/16 #30 w/ 3RF  Please advise. Thanks.

## 2016-11-05 ENCOUNTER — Ambulatory Visit (INDEPENDENT_AMBULATORY_CARE_PROVIDER_SITE_OTHER): Payer: PPO | Admitting: Orthopaedic Surgery

## 2016-11-05 ENCOUNTER — Encounter (INDEPENDENT_AMBULATORY_CARE_PROVIDER_SITE_OTHER): Payer: Self-pay | Admitting: Orthopaedic Surgery

## 2016-11-05 DIAGNOSIS — Z96649 Presence of unspecified artificial hip joint: Secondary | ICD-10-CM | POA: Diagnosis not present

## 2016-11-05 DIAGNOSIS — M25552 Pain in left hip: Secondary | ICD-10-CM

## 2016-11-05 DIAGNOSIS — C61 Malignant neoplasm of prostate: Secondary | ICD-10-CM | POA: Diagnosis not present

## 2016-11-05 DIAGNOSIS — N3941 Urge incontinence: Secondary | ICD-10-CM | POA: Diagnosis not present

## 2016-11-05 MED ORDER — GABAPENTIN 100 MG PO CAPS: 100.0000 mg | ORAL_CAPSULE | Freq: Three times a day (TID) | ORAL | 1 refills | Status: DC

## 2016-11-05 MED ORDER — OXYCODONE-ACETAMINOPHEN 5-325 MG PO TABS: 1.0000 | ORAL_TABLET | Freq: Two times a day (BID) | ORAL | 0 refills | Status: DC | PRN

## 2016-11-05 NOTE — Progress Notes (Signed)
The patient is following up after MRI of his left hip. 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 This was done 5 months ago. He's had pain all around that hip over the last 2 months especially with activities. It seems mechanical in nature. He still denies any fever and chills or any illness. We sent her for an MRI due to continued pain he was having. He is here for review this today.  On examination of his left hip can put him through internal rotation rotation does not seem to have a lot of pain in the groin area. He does have pain over the tip of the greater trochanteric area and I have injected this area in the past he said that did help for a day or 2. We did review the MRI and it shows severe atrophy of the muscles all around his left hip. The fluid collection that was there preoperatively is completely resolved. He shows no evidence of additional fluid collection that is noticed infection.  At this point I'll like centered physical therapy for strengthening the muscle from his hip. I'll continue his oxycodone which he only takes once daily. I also want him to try Neurontin 100 mg 3 times a day. We'll see him back in 6 weeks to see how is doing overall.

## 2016-11-06 DIAGNOSIS — Z96649 Presence of unspecified artificial hip joint: Secondary | ICD-10-CM | POA: Diagnosis not present

## 2016-11-06 DIAGNOSIS — M21372 Foot drop, left foot: Secondary | ICD-10-CM | POA: Diagnosis not present

## 2016-11-06 DIAGNOSIS — M25552 Pain in left hip: Secondary | ICD-10-CM | POA: Diagnosis not present

## 2016-11-08 DIAGNOSIS — Z96649 Presence of unspecified artificial hip joint: Secondary | ICD-10-CM | POA: Diagnosis not present

## 2016-11-08 DIAGNOSIS — M25552 Pain in left hip: Secondary | ICD-10-CM | POA: Diagnosis not present

## 2016-11-08 DIAGNOSIS — M21372 Foot drop, left foot: Secondary | ICD-10-CM | POA: Diagnosis not present

## 2016-11-11 ENCOUNTER — Encounter: Payer: Self-pay | Admitting: Family Medicine

## 2016-11-12 DIAGNOSIS — Z96649 Presence of unspecified artificial hip joint: Secondary | ICD-10-CM | POA: Diagnosis not present

## 2016-11-12 DIAGNOSIS — M21372 Foot drop, left foot: Secondary | ICD-10-CM | POA: Diagnosis not present

## 2016-11-12 DIAGNOSIS — M25552 Pain in left hip: Secondary | ICD-10-CM | POA: Diagnosis not present

## 2016-11-14 DIAGNOSIS — M21372 Foot drop, left foot: Secondary | ICD-10-CM | POA: Diagnosis not present

## 2016-11-14 DIAGNOSIS — M25552 Pain in left hip: Secondary | ICD-10-CM | POA: Diagnosis not present

## 2016-11-14 DIAGNOSIS — Z96649 Presence of unspecified artificial hip joint: Secondary | ICD-10-CM | POA: Diagnosis not present

## 2016-11-19 DIAGNOSIS — M21372 Foot drop, left foot: Secondary | ICD-10-CM | POA: Diagnosis not present

## 2016-11-19 DIAGNOSIS — Z96649 Presence of unspecified artificial hip joint: Secondary | ICD-10-CM | POA: Diagnosis not present

## 2016-11-19 DIAGNOSIS — M25552 Pain in left hip: Secondary | ICD-10-CM | POA: Diagnosis not present

## 2016-11-21 DIAGNOSIS — M21372 Foot drop, left foot: Secondary | ICD-10-CM | POA: Diagnosis not present

## 2016-11-21 DIAGNOSIS — Z96649 Presence of unspecified artificial hip joint: Secondary | ICD-10-CM | POA: Diagnosis not present

## 2016-11-21 DIAGNOSIS — M25552 Pain in left hip: Secondary | ICD-10-CM | POA: Diagnosis not present

## 2016-11-25 DIAGNOSIS — M5416 Radiculopathy, lumbar region: Secondary | ICD-10-CM | POA: Diagnosis not present

## 2016-11-25 DIAGNOSIS — M21372 Foot drop, left foot: Secondary | ICD-10-CM | POA: Diagnosis not present

## 2016-11-25 DIAGNOSIS — Z6830 Body mass index (BMI) 30.0-30.9, adult: Secondary | ICD-10-CM | POA: Diagnosis not present

## 2016-11-25 DIAGNOSIS — R030 Elevated blood-pressure reading, without diagnosis of hypertension: Secondary | ICD-10-CM | POA: Diagnosis not present

## 2016-11-26 DIAGNOSIS — M25552 Pain in left hip: Secondary | ICD-10-CM | POA: Diagnosis not present

## 2016-11-26 DIAGNOSIS — M21372 Foot drop, left foot: Secondary | ICD-10-CM | POA: Diagnosis not present

## 2016-11-26 DIAGNOSIS — Z96649 Presence of unspecified artificial hip joint: Secondary | ICD-10-CM | POA: Diagnosis not present

## 2016-11-28 DIAGNOSIS — Z96649 Presence of unspecified artificial hip joint: Secondary | ICD-10-CM | POA: Diagnosis not present

## 2016-11-28 DIAGNOSIS — M25552 Pain in left hip: Secondary | ICD-10-CM | POA: Diagnosis not present

## 2016-11-28 DIAGNOSIS — M21372 Foot drop, left foot: Secondary | ICD-10-CM | POA: Diagnosis not present

## 2016-12-03 DIAGNOSIS — M21372 Foot drop, left foot: Secondary | ICD-10-CM | POA: Diagnosis not present

## 2016-12-03 DIAGNOSIS — Z96649 Presence of unspecified artificial hip joint: Secondary | ICD-10-CM | POA: Diagnosis not present

## 2016-12-03 DIAGNOSIS — M25552 Pain in left hip: Secondary | ICD-10-CM | POA: Diagnosis not present

## 2016-12-05 DIAGNOSIS — M25552 Pain in left hip: Secondary | ICD-10-CM | POA: Diagnosis not present

## 2016-12-05 DIAGNOSIS — M21372 Foot drop, left foot: Secondary | ICD-10-CM | POA: Diagnosis not present

## 2016-12-05 DIAGNOSIS — Z96649 Presence of unspecified artificial hip joint: Secondary | ICD-10-CM | POA: Diagnosis not present

## 2016-12-10 DIAGNOSIS — Z96649 Presence of unspecified artificial hip joint: Secondary | ICD-10-CM | POA: Diagnosis not present

## 2016-12-10 DIAGNOSIS — M21372 Foot drop, left foot: Secondary | ICD-10-CM | POA: Diagnosis not present

## 2016-12-10 DIAGNOSIS — M25552 Pain in left hip: Secondary | ICD-10-CM | POA: Diagnosis not present

## 2016-12-12 ENCOUNTER — Telehealth (INDEPENDENT_AMBULATORY_CARE_PROVIDER_SITE_OTHER): Payer: Self-pay | Admitting: Orthopaedic Surgery

## 2016-12-12 DIAGNOSIS — Z96649 Presence of unspecified artificial hip joint: Secondary | ICD-10-CM | POA: Diagnosis not present

## 2016-12-12 DIAGNOSIS — M25552 Pain in left hip: Secondary | ICD-10-CM | POA: Diagnosis not present

## 2016-12-12 DIAGNOSIS — M21372 Foot drop, left foot: Secondary | ICD-10-CM | POA: Diagnosis not present

## 2016-12-12 NOTE — Telephone Encounter (Signed)
Patient called advised Joseph Ferns MD asked if Dr Ninfa Linden would extend (PT) for another 4 to 6 wks (twice a week) for him. Patient said he is having therapy at [facility] (PT) The number to contact patient is [phone]

## 2016-12-12 NOTE — Telephone Encounter (Signed)
That sounds good

## 2016-12-13 NOTE — Telephone Encounter (Signed)
PT aware to continue

## 2016-12-16 ENCOUNTER — Telehealth (INDEPENDENT_AMBULATORY_CARE_PROVIDER_SITE_OTHER): Payer: Self-pay | Admitting: Orthopaedic Surgery

## 2016-12-16 NOTE — Telephone Encounter (Signed)
Patient is needing an okay from Dr. Ninfa Linden to receive more PT at [facility]. Cb # [phone]

## 2016-12-17 ENCOUNTER — Ambulatory Visit (INDEPENDENT_AMBULATORY_CARE_PROVIDER_SITE_OTHER): Payer: PPO | Admitting: Orthopaedic Surgery

## 2016-12-17 NOTE — Telephone Encounter (Signed)
Called [facility] and extended therapy for him

## 2016-12-24 DIAGNOSIS — Z96649 Presence of unspecified artificial hip joint: Secondary | ICD-10-CM | POA: Diagnosis not present

## 2016-12-24 DIAGNOSIS — M25552 Pain in left hip: Secondary | ICD-10-CM | POA: Diagnosis not present

## 2016-12-24 DIAGNOSIS — M21372 Foot drop, left foot: Secondary | ICD-10-CM | POA: Diagnosis not present

## 2016-12-26 DIAGNOSIS — M21372 Foot drop, left foot: Secondary | ICD-10-CM | POA: Diagnosis not present

## 2016-12-26 DIAGNOSIS — M25552 Pain in left hip: Secondary | ICD-10-CM | POA: Diagnosis not present

## 2016-12-26 DIAGNOSIS — Z96649 Presence of unspecified artificial hip joint: Secondary | ICD-10-CM | POA: Diagnosis not present

## 2016-12-31 DIAGNOSIS — M25552 Pain in left hip: Secondary | ICD-10-CM | POA: Diagnosis not present

## 2016-12-31 DIAGNOSIS — M21372 Foot drop, left foot: Secondary | ICD-10-CM | POA: Diagnosis not present

## 2016-12-31 DIAGNOSIS — Z96649 Presence of unspecified artificial hip joint: Secondary | ICD-10-CM | POA: Diagnosis not present

## 2017-01-01 ENCOUNTER — Other Ambulatory Visit: Payer: Self-pay | Admitting: Neurosurgery

## 2017-01-01 DIAGNOSIS — M5416 Radiculopathy, lumbar region: Secondary | ICD-10-CM

## 2017-01-02 DIAGNOSIS — Z96649 Presence of unspecified artificial hip joint: Secondary | ICD-10-CM | POA: Diagnosis not present

## 2017-01-02 DIAGNOSIS — M21372 Foot drop, left foot: Secondary | ICD-10-CM | POA: Diagnosis not present

## 2017-01-02 DIAGNOSIS — M25552 Pain in left hip: Secondary | ICD-10-CM | POA: Diagnosis not present

## 2017-01-07 DIAGNOSIS — M25552 Pain in left hip: Secondary | ICD-10-CM | POA: Diagnosis not present

## 2017-01-07 DIAGNOSIS — Z96649 Presence of unspecified artificial hip joint: Secondary | ICD-10-CM | POA: Diagnosis not present

## 2017-01-07 DIAGNOSIS — M21372 Foot drop, left foot: Secondary | ICD-10-CM | POA: Diagnosis not present

## 2017-01-09 ENCOUNTER — Ambulatory Visit (INDEPENDENT_AMBULATORY_CARE_PROVIDER_SITE_OTHER): Payer: PPO | Admitting: Family Medicine

## 2017-01-09 ENCOUNTER — Encounter: Payer: Self-pay | Admitting: Family Medicine

## 2017-01-09 VITALS — BP 134/78 | HR 60 | Temp 97.7°F | Resp 16 | Wt 244.0 lb

## 2017-01-09 DIAGNOSIS — Z0000 Encounter for general adult medical examination without abnormal findings: Secondary | ICD-10-CM | POA: Diagnosis not present

## 2017-01-09 DIAGNOSIS — M21372 Foot drop, left foot: Secondary | ICD-10-CM | POA: Diagnosis not present

## 2017-01-09 DIAGNOSIS — Z96649 Presence of unspecified artificial hip joint: Secondary | ICD-10-CM | POA: Diagnosis not present

## 2017-01-09 DIAGNOSIS — M25552 Pain in left hip: Secondary | ICD-10-CM | POA: Diagnosis not present

## 2017-01-09 DIAGNOSIS — I82403 Acute embolism and thrombosis of unspecified deep veins of lower extremity, bilateral: Secondary | ICD-10-CM | POA: Diagnosis not present

## 2017-01-09 NOTE — Patient Instructions (Addendum)
Bring a copy of your advance directives to your next office visit.  Continue doing brain stimulating activities (puzzles, reading, adult coloring books, staying active) to keep memory sharp.   Continue to eat heart healthy diet (full of fruits, vegetables, whole grains, lean protein, water--limit salt, fat, and sugar intake) and increase physical activity as tolerated.  Health Maintenance, Male A healthy lifestyle and preventive care is important for your health and wellness. Ask your health care provider about what schedule of regular examinations is right for you. What should I know about weight and diet?  Eat a Healthy Diet  Eat plenty of vegetables, fruits, whole grains, low-fat dairy products, and lean protein.  Do not eat a lot of foods high in solid fats, added sugars, or salt. Maintain a Healthy Weight  Regular exercise can help you achieve or maintain a healthy weight. You should:  Do at least 150 minutes of exercise each week. The exercise should increase your heart rate and make you sweat (moderate-intensity exercise).  Do strength-training exercises at least twice a week. Watch Your Levels of Cholesterol and Blood Lipids  Have your blood tested for lipids and cholesterol every 5 years starting at 72 years of age. If you are at high risk for heart disease, you should start having your blood tested when you are 72 years old. You may need to have your cholesterol levels checked more often if:  Your lipid or cholesterol levels are high.  You are older than 72 years of age.  You are at high risk for heart disease. What should I know about cancer screening? Many types of cancers can be detected early and may often be prevented. Lung Cancer  You should be screened every year for lung cancer if:  You are a current smoker who has smoked for at least 30 years.  You are a former smoker who has quit within the past 15 years.  Talk to your health care provider about your  screening options, when you should start screening, and how often you should be screened. Colorectal Cancer  Routine colorectal cancer screening usually begins at 72 years of age and should be repeated every 5-10 years until you are 72 years old. You may need to be screened more often if early forms of precancerous polyps or small growths are found. Your health care provider may recommend screening at an earlier age if you have risk factors for colon cancer.  Your health care provider may recommend using home test kits to check for hidden blood in the stool.  A small camera at the end of a tube can be used to examine your colon (sigmoidoscopy or colonoscopy). This checks for the earliest forms of colorectal cancer. Prostate and Testicular Cancer  Depending on your age and overall health, your health care provider may do certain tests to screen for prostate and testicular cancer.  Talk to your health care provider about any symptoms or concerns you have about testicular or prostate cancer. Skin Cancer  Check your skin from head to toe regularly.  Tell your health care provider about any new moles or changes in moles, especially if:  There is a change in a mole's size, shape, or color.  You have a mole that is larger than a pencil eraser.  Always use sunscreen. Apply sunscreen liberally and repeat throughout the day.  Protect yourself by wearing long sleeves, pants, a wide-brimmed hat, and sunglasses when outside. What should I know about heart disease, diabetes, and high   blood pressure?  If you are 18-39 years of age, have your blood pressure checked every 3-5 years. If you are 40 years of age or older, have your blood pressure checked every year. You should have your blood pressure measured twice-once when you are at a hospital or clinic, and once when you are not at a hospital or clinic. Record the average of the two measurements. To check your blood pressure when you are not at a  hospital or clinic, you can use:  An automated blood pressure machine at a pharmacy.  A home blood pressure monitor.  Talk to your health care provider about your target blood pressure.  If you are between 45-79 years old, ask your health care provider if you should take aspirin to prevent heart disease.  Have regular diabetes screenings by checking your fasting blood sugar level.  If you are at a normal weight and have a low risk for diabetes, have this test once every three years after the age of 45.  If you are overweight and have a high risk for diabetes, consider being tested at a younger age or more often.  A one-time screening for abdominal aortic aneurysm (AAA) by ultrasound is recommended for men aged 65-75 years who are current or former smokers. What should I know about preventing infection? Hepatitis B  If you have a higher risk for hepatitis B, you should be screened for this virus. Talk with your health care provider to find out if you are at risk for hepatitis B infection. Hepatitis C  Blood testing is recommended for:  Everyone born from 1945 through 1965.  Anyone with known risk factors for hepatitis C. Sexually Transmitted Diseases (STDs)  You should be screened each year for STDs including gonorrhea and chlamydia if:  You are sexually active and are younger than 72 years of age.  You are older than 72 years of age and your health care provider tells you that you are at risk for this type of infection.  Your sexual activity has changed since you were last screened and you are at an increased risk for chlamydia or gonorrhea. Ask your health care provider if you are at risk.  Talk with your health care provider about whether you are at high risk of being infected with HIV. Your health care provider may recommend a prescription medicine to help prevent HIV infection. What else can I do?  Schedule regular health, dental, and eye exams.  Stay current with your  vaccines (immunizations).  Do not use any tobacco products, such as cigarettes, chewing tobacco, and e-cigarettes. If you need help quitting, ask your health care provider.  Limit alcohol intake to no more than 2 drinks per day. One drink equals 12 ounces of beer, 5 ounces of wine, or 1 ounces of hard liquor.  Do not use street drugs.  Do not share needles.  Ask your health care provider for help if you need support or information about quitting drugs.  Tell your health care provider if you often feel depressed.  Tell your health care provider if you have ever been abused or do not feel safe at home. This information is not intended to replace advice given to you by your health care provider. Make sure you discuss any questions you have with your health care provider. Document Released: 03/21/2008 Document Revised: 05/22/2016 Document Reviewed: 06/27/2015 Elsevier Interactive Patient Education  2017 Elsevier Inc.  

## 2017-01-09 NOTE — Progress Notes (Signed)
Reviewed AWV and agree.  Signed:  Crissie Sickles, MD           01/09/2017

## 2017-01-09 NOTE — Progress Notes (Signed)
Subjective:   [name] is a 71 y.o. male who presents for an Initial Medicare Annual Wellness Visit.  Review of Systems  No ROS.  Medicare Wellness Visit.  Cardiac Risk Factors include: advanced age (>72men, >17 women);male gender;family history of premature cardiovascular disease   Sleep patterns: Sleeps about 8 hours, up to void x 1-2.  Home Safety/Smoke Alarms:  Smoke detectors in place. Living environment; residence and Firearm Safety: Lives with wife in split level, uses rail. Feels safe in home. Firearms locked away. Family lives next door.  Seat Belt Safety/Bike Helmet: Wears seat belt.   Counseling:   Eye Exam- Last exam > 2 years. Seen for vision changes by Dr. Marica [facility]ter [facility]-Last exam 07/2016, every 6 months by Dr. Tye Savoy  Male:   [facility]-Colonoscopy 04/12/2014, polyp. Recall 5 years.      PSA-02/09/2016, 0.20. Followed by [facility].      Objective:    Today's Vitals   01/09/17 1105  BP: 134/78  Pulse: 60  Resp: 16  Temp: 97.7 F (36.5 C)  TempSrc: Oral  SpO2: 97%  Weight: 244 lb (110.7 kg)   Body mass index is 30.5 kg/m.  Current Medications (verified) Outpatient Encounter Prescriptions as of 01/09/2017  Medication Sig  . gabapentin (NEURONTIN) 100 MG capsule Take 1 capsule (100 mg total) by mouth 3 (three) times daily.  . solifenacin (VESICARE) 5 MG tablet Take 5 mg by mouth daily.  Alveda Reasons 20 MG TABS tablet TAKE 1 TABLET (20 MG TOTAL) BY MOUTH DAILY WITH SUPPER.  . traMADol (ULTRAM) 50 MG tablet Take 50 mg by mouth every 8 (eight) hours as needed for moderate pain.   . [DISCONTINUED] ceFAZolin (ANCEF) 1 GM/50ML Inject 50 mLs (1 g total) into the vein every 8 (eight) hours. (Patient not taking: Reported on 11/05/2016)  . [DISCONTINUED] diclofenac sodium (VOLTAREN) 1 % GEL Apply 2 g topically 4 (four) times daily. (Patient not taking: Reported on 11/05/2016)  . [DISCONTINUED] methylPREDNISolone (MEDROL) 4 MG tablet Medrol dose  pack. Take as instructed (Patient not taking: Reported on 11/05/2016)  . [DISCONTINUED] oxyCODONE-acetaminophen (ROXICET) 5-325 MG tablet Take 1-2 tablets by mouth 2 (two) times daily as needed for severe pain. (Patient not taking: Reported on 01/09/2017)  . [DISCONTINUED] sulfamethoxazole-trimethoprim (BACTRIM DS,SEPTRA DS) 800-160 MG tablet Take 1 tablet by mouth 2 (two) times daily. (Patient not taking: Reported on 11/05/2016)  . [DISCONTINUED] tiZANidine (ZANAFLEX) 2 MG tablet Take 1-2 tablets (2-4 mg total) by mouth every 6 (six) hours as needed for muscle spasms. (Patient not taking: Reported on 01/09/2017)   No facility-administered encounter medications on file as of 01/09/2017.     Allergies (verified) Patient has no known allergies.   History: Past Medical History:  Diagnosis Date  . Anterior dislocation of right shoulder 09/2013   Reduced in ED under sedation.  Dislocated posteriorly 2015, ? partial RC tear, has f/u planned with Dr. Marlou Sa--? surgery? possible plan for 2016.  [name] Blood donor, whole blood    Has donated approx 30 gallons in his lifetime  . 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  [name] Chronic renal insufficiency, stage II (mild)   . Chronic venous insufficiency    +compression hose  . DDD (degenerative disc disease), lumbar 2017   Dr. Noe Gens ortho.  He is now wearing a new leg brace.  Has had  ESI and will get another.  [name] DVT (deep venous thrombosis) ([place]) 06/14/2016   bilateral LL's; in post-op setting s/p hip surgery.  Dx'd in the ED, where xarelto was rx'd.  . Foot drop, left    s/p hip fracture 1969  . 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  As of urol f/u 08/2014, plan is for me to follow PSAs and fax results to Dr. Junious Silk: PSA 02/2015 was  0.21 (stable).  Stable on f/u with Dr. Junious Silk 08/2015 and 11/2016.  [name] Hx of adenomatous colonic polyps    polypectomy 2007; 2012; 2015 (+adenomatous, no high grade dysplasia); recall 5 yrs  . Osteoarthritis    knees and hips primarily  . Toe infection 05/27/2013   Left great toe and right 2nd toe  . Trochanteric bursitis of left hip    Steroid injection by ortho 09/10/16  . Urge incontinence    vesicare helpful   Past Surgical History:  Procedure Laterality Date  . ANTERIOR HIP REVISION Left 06/07/2016   Procedure: LEFT ANTERIOR HIP ACETABULAR REVISION;  Surgeon: Mcarthur Rossetti, MD;  Location: [facility];  Service: Orthopedics;  Laterality: Left;  . arthroscopy left shoulder Left 07  . bilateral carpal tunnel release  2003  . BILATERAL CARPAL TUNNEL RELEASE    . COLONOSCOPY  2007;2012;2015   04/12/2014 Tubular adenoma x 1; per Dr. Henrene Pastor recall 5 yrs  . FRACTURE SURGERY Left   . JOINT REPLACEMENT Left    hip  . LUMBAR LAMINECTOMY/DECOMPRESSION MICRODISCECTOMY Left 03/28/2016   Procedure: Left Lumbar three-four Laminoforaminotomy;  Surgeon: Erline Levine, MD;  Location: [facility];  Service: Neurosurgery;  Laterality: Left;  left  . RADIOACTIVE SEED IMPLANT  2012  . ROTATOR CUFF REPAIR  2006   right  . seed implant prostate  2012  . SHOULDER ARTHROSCOPY Left   . TOTAL HIP ARTHROPLASTY  2008   left (cobalt chrome)  . VASECTOMY     Family History  Problem Relation Age of Onset  . Lung cancer Mother   . Heart disease Father    Social History   Occupational History  . Not on file.   Social History Main Topics  . Smoking status: Never Smoker  . Smokeless tobacco: Never Used  . Alcohol use No  . Drug use: No  . Sexual activity: Not on file   Tobacco Counseling Counseling given: Yes   Activities of Daily Living In your present state of health, do you have any difficulty performing the following activities: 01/09/2017 06/07/2016  Hearing? N N  Vision? N N  Difficulty  concentrating or making decisions? N N  Walking or climbing stairs? Y Y  Dressing or bathing? N Y  Doing errands, shopping? N Y  Conservation officer, nature and eating ? N -  Using the Toilet? N -  In the past six months, have you accidently leaked urine? N -  Do you have problems with loss of bowel control? N -  Managing your Medications? N -  Managing your Finances? N -  Housekeeping or managing your Housekeeping? N -  Some recent data might be hidden    Immunizations and Health Maintenance Immunization History  Administered Date(s) Administered  . Influenza Split 06/08/2011, 07/07/2012  . Influenza, High Dose Seasonal PF 07/11/2016  . Influenza-Unspecified 07/21/2013  . Pneumococcal Conjugate-13 12/10/2013  . Pneumococcal Polysaccharide-23 05/21/2010  . Td 01/06/2004  . Tdap 11/25/2012  . Zoster  11/26/2011   Health Maintenance Due  Topic Date Due  . Hepatitis C Screening  [birth year]  . URINE MICROALBUMIN  01[birth year]17/1955    Patient Care Team: Tammi Sou, MD as PCP - General (Family Medicine) Festus Aloe, MD as Consulting Physician (Urology) Meredith Pel, MD as Consulting Physician (Orthopedic Surgery) Mcarthur Rossetti, MD as Consulting Physician (Orthopedic Surgery) Erline Levine, MD as Consulting Physician (Neurosurgery)  Indicate any recent Bryan you may have received from other than [facility] providers in the past year (date may be approximate).    Assessment:   This is a routine wellness examination for Zena. Physical assessment deferred to PCP.   Hearing/Vision screen Hearing Screening Comments: Able to hear conversational tones w/o difficulty. No issues reported.   Vision Screening Comments: Wears glasses.   Dietary issues and exercise activities discussed: Current Exercise Habits: The patient does not participate in regular exercise at present (Physical Therapy twice/week), Exercise limited by: orthopedic condition(s)   Diet (meal  preparation, eat out, water intake, caffeinated beverages, dairy products, fruits and vegetables): Drinks water and sweet tea.   Breakfast: Eggs, sausage/bacon, cereal, fruit, coffee rarely (1 cup/week) Lunch: soups, sandwich, salads Dinner: lean meat with vegetables. Red meat twice/week.     Goals    . <enter goal here>          "no hip pain and get rid of walker" by continuing to complete PT exercises.       Depression Screen PHQ 2/9 Scores 01/09/2017 02/09/2016 02/08/2015  PHQ - 2 Score 0 0 0    Fall Risk Fall Risk  01/09/2017 02/09/2016 02/08/2015  Falls in the past year? No No Yes  Number falls in past yr: - - 1  Injury with Fall? - - Yes    Cognitive Function:       Ad8 score reviewed for issues:  Issues making decisions: no  Less interest in hobbies / activities: no  Repeats questions, stories (family complaining): no  Trouble using ordinary gadgets (microwave, computer, phone): no  Forgets the month or year:  no  Mismanaging finances: no  Remembering appts: no  Daily problems with thinking and/or memory: no Ad8 score is=0     Screening Tests Health Maintenance  Topic Date Due  . Hepatitis C Screening  12-14-1945  . URINE MICROALBUMIN  01/17/1955  . INFLUENZA VACCINE  05/07/2017  . COLONOSCOPY  04/13/2019  . TETANUS/TDAP  11/25/2022  . PNA vac Low Risk Adult  Completed   Declines Hepatitis C Screening today.       Plan:     Bring a copy of your advance directives to your next office visit.  Continue doing brain stimulating activities (puzzles, reading, adult coloring books, staying active) to keep memory sharp.   Continue to eat heart healthy diet (full of fruits, vegetables, whole grains, lean protein, water--limit salt, fat, and sugar intake) and increase physical activity as tolerated.  During the course of the visit Maher was educated and counseled about the following appropriate screening and preventive services:   Vaccines to include  Pneumoccal, Influenza, Hepatitis B, Td, Zostavax, HCV  Colorectal cancer screening  Cardiovascular disease screening  Diabetes screening  Glaucoma screening  Nutrition counseling  Prostate cancer screening  Patient Instructions (the written plan) were given to the patient.   Gerilyn Nestle, RN   01/09/2017

## 2017-01-09 NOTE — Progress Notes (Signed)
OFFICE VISIT  01/09/2017   CC:  Chief Complaint  Patient presents with  . Follow-up    DVT  . Medicare Wellness   HPI:    Patient is a 71 y.o. Caucasian male who presents for 6 mo f/u DVT. He has taken a full 6 mo of xarelto 20mg  qd.  No nosebleeds, no gross hematuria, no blood in stool.  No pain in legs.  No excessive bruising. He feels like his LE swelling is at baseline chronic level.  He currently ambulates using a walker and does not remain immobile for extended periods of time.  He has back pain and is set to get MRI of back tomorrow ordered by Dr. Vertell Limber.   Past Medical History:  Diagnosis Date  . Anterior dislocation of right shoulder 09/2013   Reduced in ED under sedation.  Dislocated posteriorly 2015, ? partial RC tear, has f/u planned with Dr. Marlou Sa--? surgery? possible plan for 2016.  [name] Blood donor, whole blood    Has donated approx 30 gallons in his lifetime  . 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  [name] Chronic renal insufficiency, stage II (mild)   . Chronic venous insufficiency    +compression hose  . DDD (degenerative disc disease), lumbar 2017   Dr. Noe Gens ortho.  He is now wearing a new leg brace.  Has had ESI and will get another.  [name] DVT (deep venous thrombosis) ([place]) 06/14/2016   bilateral LL's; in post-op setting s/p hip surgery.  Dx'd in the ED, where xarelto was rx'd.  . Foot drop, left    s/p hip fracture 1969  . 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  As of urol f/u 08/2014, plan is for me to follow PSAs and fax results to Dr. Junious Silk: PSA 02/2015 was 0.21 (stable).  Stable on f/u with Dr. Junious Silk 08/2015 and 11/2016.  [name] Hx of adenomatous colonic polyps    polypectomy 2007; 2012; 2015 (+adenomatous, no high grade  dysplasia); recall 5 yrs  . Osteoarthritis    knees and hips primarily  . Toe infection 05/27/2013   Left great toe and right 2nd toe  . Trochanteric bursitis of left hip    Steroid injection by ortho 09/10/16  . Urge incontinence    vesicare helpful    Past Surgical History:  Procedure Laterality Date  . ANTERIOR HIP REVISION Left 06/07/2016   Procedure: LEFT ANTERIOR HIP ACETABULAR REVISION;  Surgeon: Mcarthur Rossetti, MD;  Location: [facility];  Service: Orthopedics;  Laterality: Left;  . arthroscopy left shoulder Left 07  . bilateral carpal tunnel release  2003  . BILATERAL CARPAL TUNNEL RELEASE    . COLONOSCOPY  2007;2012;2015   04/12/2014 Tubular adenoma x 1; per Dr. Henrene Pastor recall 5 yrs  . FRACTURE SURGERY Left   . JOINT REPLACEMENT Left    hip  . LUMBAR LAMINECTOMY/DECOMPRESSION MICRODISCECTOMY Left 03/28/2016   Procedure: Left Lumbar three-four Laminoforaminotomy;  Surgeon: Erline Levine, MD;  Location: [facility];  Service: Neurosurgery;  Laterality: Left;  left  . RADIOACTIVE SEED IMPLANT  2012  . ROTATOR CUFF REPAIR  2006   right  . seed implant prostate  2012  . SHOULDER ARTHROSCOPY Left   . TOTAL HIP ARTHROPLASTY  2008   left (cobalt chrome)  .  VASECTOMY      Outpatient Medications Prior to Visit  Medication Sig Dispense Refill  . gabapentin (NEURONTIN) 100 MG capsule Take 1 capsule (100 mg total) by mouth 3 (three) times daily. 60 capsule 1  . solifenacin (VESICARE) 5 MG tablet Take 5 mg by mouth daily.    Alveda Reasons 20 MG TABS tablet TAKE 1 TABLET (20 MG TOTAL) BY MOUTH DAILY WITH SUPPER. 30 tablet 2  . traMADol (ULTRAM) 50 MG tablet Take 50 mg by mouth every 8 (eight) hours as needed for moderate pain.   0  . ceFAZolin (ANCEF) 1 GM/50ML Inject 50 mLs (1 g total) into the vein every 8 (eight) hours. (Patient not taking: Reported on 11/05/2016) 4500 mL 0  . diclofenac sodium (VOLTAREN) 1 % GEL Apply 2 g topically 4 (four) times daily. (Patient not taking: Reported on  11/05/2016) 100 g 3  . methylPREDNISolone (MEDROL) 4 MG tablet Medrol dose pack. Take as instructed (Patient not taking: Reported on 11/05/2016) 21 tablet 0  . oxyCODONE-acetaminophen (ROXICET) 5-325 MG tablet Take 1-2 tablets by mouth 2 (two) times daily as needed for severe pain. (Patient not taking: Reported on 01/09/2017) 60 tablet 0  . sulfamethoxazole-trimethoprim (BACTRIM DS,SEPTRA DS) 800-160 MG tablet Take 1 tablet by mouth 2 (two) times daily. (Patient not taking: Reported on 11/05/2016) 28 tablet 0  . tiZANidine (ZANAFLEX) 2 MG tablet Take 1-2 tablets (2-4 mg total) by mouth every 6 (six) hours as needed for muscle spasms. (Patient not taking: Reported on 01/09/2017) 60 tablet 0   No facility-administered medications prior to visit.     No Known Allergies  ROS As per HPI  PE: Blood pressure 134/78, pulse 60, temperature 97.7 F (36.5 C), temperature source Oral, resp. rate 16, weight 244 lb (110.7 kg), SpO2 97 %. Gen: Alert, well appearing.  Patient is oriented to person, place, time, and situation. AFFECT: pleasant, lucid thought and speech. Lower extremities: he is wearing compression stockings: I can detect 1+ edema in LL's at the most.  No tenderness in legs at all.  LABS:  Lab Results  Component Value Date   TSH 1.19 02/09/2016   Lab Results  Component Value Date   WBC 11.8 (H) 06/10/2016   HGB 8.6 (L) 06/11/2016   HCT 26.4 (L) 06/11/2016   MCV 75.8 (L) 06/10/2016   PLT 298 06/10/2016   Lab Results  Component Value Date   CREATININE 1.50 (H) 06/08/2016   BUN 19 06/08/2016   NA 134 (L) 06/08/2016   K 4.8 06/08/2016   CL 102 06/08/2016   CO2 25 06/08/2016   Lab Results  Component Value Date   ALT 14 02/09/2016   AST 12 02/09/2016   ALKPHOS 86 02/09/2016   BILITOT 0.5 02/09/2016   Lab Results  Component Value Date   CHOL 159 02/09/2016   Lab Results  Component Value Date   HDL 36.80 (L) 02/09/2016   Lab Results  Component Value Date   LDLCALC 109 (H)  02/09/2016   Lab Results  Component Value Date   TRIG 68.0 02/09/2016   Lab Results  Component Value Date   CHOLHDL 4 02/09/2016   Lab Results  Component Value Date   PSA 0.20 02/09/2016   PSA 0.21 02/08/2015   PSA 15.60 (H) 05/21/2010   Lab Results  Component Value Date   HGBA1C 5.4 05/27/2013   IMPRESSION AND PLAN:  Bilateral lower extremity DVTs, has completed 6 mo of xarelto. Plan is to recheck bilat LE  venous dopplers today.  If no remaining thrombus then my plan was to contine low dose (10mg ) xarelto x 1 yr for the best chance of prevention of recurrence. Of note, he is still not as mobile as normal due to ongoing hip and back pain--requires a walker to ambulate and is currently still doing PT. Will run my plan by Dr. Marin Olp, hematologist, and make sure he is in agreement regarding the extended low dose xarelto plan.  An After Visit Summary was printed and given to the patient.  FOLLOW UP: Return in about 6 months (around 07/11/2017) for annual CPE (fasting).

## 2017-01-09 NOTE — Progress Notes (Signed)
Pre visit review using our clinic review tool, if applicable. No additional management support is needed unless otherwise documented below in the visit note. 

## 2017-01-10 ENCOUNTER — Ambulatory Visit
Admission: RE | Admit: 2017-01-10 | Discharge: 2017-01-10 | Disposition: A | Discharge status: Home / Self Care | Payer: PPO | Source: Ambulatory Visit | Attending: Neurosurgery | Admitting: Neurosurgery

## 2017-01-10 DIAGNOSIS — M5416 Radiculopathy, lumbar region: Secondary | ICD-10-CM

## 2017-01-10 DIAGNOSIS — M48061 Spinal stenosis, lumbar region without neurogenic claudication: Secondary | ICD-10-CM | POA: Diagnosis not present

## 2017-01-10 MED ORDER — GADOBENATE DIMEGLUMINE 529 MG/ML IV SOLN
20.0000 mL | Freq: Once | INTRAVENOUS | Status: AC | PRN
  Administered 2017-01-10: 20 mL via INTRAVENOUS

## 2017-01-13 ENCOUNTER — Ambulatory Visit (HOSPITAL_BASED_OUTPATIENT_CLINIC_OR_DEPARTMENT_OTHER)
Admission: RE | Admit: 2017-01-13 | Discharge: 2017-01-13 | Disposition: A | Discharge status: Home / Self Care | Payer: PPO | Source: Ambulatory Visit | Attending: Family Medicine | Admitting: Family Medicine

## 2017-01-13 DIAGNOSIS — I82512 Chronic embolism and thrombosis of left femoral vein: Secondary | ICD-10-CM | POA: Insufficient documentation

## 2017-01-13 DIAGNOSIS — I82403 Acute embolism and thrombosis of unspecified deep veins of lower extremity, bilateral: Secondary | ICD-10-CM

## 2017-01-14 ENCOUNTER — Other Ambulatory Visit: Payer: Self-pay | Admitting: Family Medicine

## 2017-01-14 DIAGNOSIS — M25552 Pain in left hip: Secondary | ICD-10-CM | POA: Diagnosis not present

## 2017-01-14 DIAGNOSIS — Z96649 Presence of unspecified artificial hip joint: Secondary | ICD-10-CM | POA: Diagnosis not present

## 2017-01-14 DIAGNOSIS — M21372 Foot drop, left foot: Secondary | ICD-10-CM | POA: Diagnosis not present

## 2017-01-14 MED ORDER — RIVAROXABAN 10 MG PO TABS: 10.0000 mg | ORAL_TABLET | Freq: Every day | ORAL | 10 refills | Status: DC

## 2017-01-15 DIAGNOSIS — G5702 Lesion of sciatic nerve, left lower limb: Secondary | ICD-10-CM | POA: Diagnosis not present

## 2017-01-15 DIAGNOSIS — Z6830 Body mass index (BMI) 30.0-30.9, adult: Secondary | ICD-10-CM | POA: Diagnosis not present

## 2017-01-15 DIAGNOSIS — M21372 Foot drop, left foot: Secondary | ICD-10-CM | POA: Diagnosis not present

## 2017-01-15 DIAGNOSIS — M5416 Radiculopathy, lumbar region: Secondary | ICD-10-CM | POA: Diagnosis not present

## 2017-01-16 DIAGNOSIS — Z96649 Presence of unspecified artificial hip joint: Secondary | ICD-10-CM | POA: Diagnosis not present

## 2017-01-16 DIAGNOSIS — M5416 Radiculopathy, lumbar region: Secondary | ICD-10-CM | POA: Diagnosis not present

## 2017-01-16 DIAGNOSIS — M961 Postlaminectomy syndrome, not elsewhere classified: Secondary | ICD-10-CM | POA: Diagnosis not present

## 2017-01-16 DIAGNOSIS — M21372 Foot drop, left foot: Secondary | ICD-10-CM | POA: Diagnosis not present

## 2017-01-16 DIAGNOSIS — M533 Sacrococcygeal disorders, not elsewhere classified: Secondary | ICD-10-CM | POA: Diagnosis not present

## 2017-01-16 DIAGNOSIS — M25552 Pain in left hip: Secondary | ICD-10-CM | POA: Diagnosis not present

## 2017-01-18 ENCOUNTER — Other Ambulatory Visit (INDEPENDENT_AMBULATORY_CARE_PROVIDER_SITE_OTHER): Payer: Self-pay | Admitting: Orthopaedic Surgery

## 2017-01-20 NOTE — Telephone Encounter (Signed)
Please advise 

## 2017-01-21 DIAGNOSIS — M21372 Foot drop, left foot: Secondary | ICD-10-CM | POA: Diagnosis not present

## 2017-01-21 DIAGNOSIS — M25552 Pain in left hip: Secondary | ICD-10-CM | POA: Diagnosis not present

## 2017-01-21 DIAGNOSIS — Z96649 Presence of unspecified artificial hip joint: Secondary | ICD-10-CM | POA: Diagnosis not present

## 2017-01-23 DIAGNOSIS — M25552 Pain in left hip: Secondary | ICD-10-CM | POA: Diagnosis not present

## 2017-01-23 DIAGNOSIS — Z96649 Presence of unspecified artificial hip joint: Secondary | ICD-10-CM | POA: Diagnosis not present

## 2017-01-23 DIAGNOSIS — M21372 Foot drop, left foot: Secondary | ICD-10-CM | POA: Diagnosis not present

## 2017-01-23 DIAGNOSIS — M5416 Radiculopathy, lumbar region: Secondary | ICD-10-CM | POA: Diagnosis not present

## 2017-01-23 DIAGNOSIS — G5702 Lesion of sciatic nerve, left lower limb: Secondary | ICD-10-CM | POA: Diagnosis not present

## 2017-01-23 DIAGNOSIS — M533 Sacrococcygeal disorders, not elsewhere classified: Secondary | ICD-10-CM | POA: Diagnosis not present

## 2017-01-28 DIAGNOSIS — M25552 Pain in left hip: Secondary | ICD-10-CM | POA: Diagnosis not present

## 2017-01-28 DIAGNOSIS — Z96649 Presence of unspecified artificial hip joint: Secondary | ICD-10-CM | POA: Diagnosis not present

## 2017-01-28 DIAGNOSIS — M21372 Foot drop, left foot: Secondary | ICD-10-CM | POA: Diagnosis not present

## 2017-01-30 DIAGNOSIS — M21372 Foot drop, left foot: Secondary | ICD-10-CM | POA: Diagnosis not present

## 2017-01-30 DIAGNOSIS — M25552 Pain in left hip: Secondary | ICD-10-CM | POA: Diagnosis not present

## 2017-01-30 DIAGNOSIS — Z96649 Presence of unspecified artificial hip joint: Secondary | ICD-10-CM | POA: Diagnosis not present

## 2017-02-04 DIAGNOSIS — E7800 Pure hypercholesterolemia, unspecified: Secondary | ICD-10-CM

## 2017-02-04 DIAGNOSIS — M25552 Pain in left hip: Secondary | ICD-10-CM | POA: Diagnosis not present

## 2017-02-04 DIAGNOSIS — Z96649 Presence of unspecified artificial hip joint: Secondary | ICD-10-CM | POA: Diagnosis not present

## 2017-02-04 DIAGNOSIS — M21372 Foot drop, left foot: Secondary | ICD-10-CM | POA: Diagnosis not present

## 2017-02-04 HISTORY — DX: Pure hypercholesterolemia, unspecified: E78.00

## 2017-02-05 ENCOUNTER — Other Ambulatory Visit: Payer: Self-pay | Admitting: Family Medicine

## 2017-02-06 DIAGNOSIS — Z96649 Presence of unspecified artificial hip joint: Secondary | ICD-10-CM | POA: Diagnosis not present

## 2017-02-06 DIAGNOSIS — M25552 Pain in left hip: Secondary | ICD-10-CM | POA: Diagnosis not present

## 2017-02-06 DIAGNOSIS — M21372 Foot drop, left foot: Secondary | ICD-10-CM | POA: Diagnosis not present

## 2017-02-10 ENCOUNTER — Encounter: Payer: Self-pay | Admitting: Family Medicine

## 2017-02-10 ENCOUNTER — Ambulatory Visit (INDEPENDENT_AMBULATORY_CARE_PROVIDER_SITE_OTHER): Payer: PPO | Admitting: Family Medicine

## 2017-02-10 VITALS — BP 140/80 | HR 49 | Temp 97.8°F | Resp 16 | Ht 75.0 in | Wt 244.5 lb

## 2017-02-10 DIAGNOSIS — Z0000 Encounter for general adult medical examination without abnormal findings: Secondary | ICD-10-CM

## 2017-02-10 DIAGNOSIS — N183 Chronic kidney disease, stage 3 unspecified: Secondary | ICD-10-CM

## 2017-02-10 DIAGNOSIS — Z125 Encounter for screening for malignant neoplasm of prostate: Secondary | ICD-10-CM | POA: Diagnosis not present

## 2017-02-10 DIAGNOSIS — D509 Iron deficiency anemia, unspecified: Secondary | ICD-10-CM

## 2017-02-10 LAB — TSH: TSH: 2.07 u[IU]/mL (ref 0.35–4.50)

## 2017-02-10 LAB — LIPID PANEL
CHOL/HDL RATIO: 4
Cholesterol: 185 mg/dL (ref 0–200)
HDL: 49.3 mg/dL (ref >39.00)
LDL CALC: 120 mg/dL — AB (ref 0–99)
NonHDL: 135.58
TRIGLYCERIDES: 80 mg/dL (ref 0.0–149.0)
VLDL: 16 mg/dL (ref 0.0–40.0)

## 2017-02-10 LAB — COMPREHENSIVE METABOLIC PANEL
ALBUMIN: 3.8 g/dL (ref 3.5–5.2)
ALT: 14 U/L (ref 0–53)
AST: 12 U/L (ref 0–37)
Alkaline Phosphatase: 81 U/L (ref 39–117)
BUN: 21 mg/dL (ref 6–23)
CALCIUM: 10.2 mg/dL (ref 8.4–10.5)
CHLORIDE: 105 meq/L (ref 96–112)
CO2: 27 meq/L (ref 19–32)
Creatinine, Ser: 1.01 mg/dL (ref 0.40–1.50)
GFR: 77.16 mL/min (ref >60.00)
Glucose, Bld: 94 mg/dL (ref 70–99)
POTASSIUM: 4.7 meq/L (ref 3.5–5.1)
Sodium: 137 mEq/L (ref 135–145)
Total Bilirubin: 0.6 mg/dL (ref 0.2–1.2)
Total Protein: 7.1 g/dL (ref 6.0–8.3)

## 2017-02-10 LAB — CBC WITH DIFFERENTIAL/PLATELET
BASOS ABS: 0 10*3/uL (ref 0.0–0.1)
BASOS PCT: 0.7 % (ref 0.0–3.0)
EOS ABS: 0.1 10*3/uL (ref 0.0–0.7)
Eosinophils Relative: 1.9 % (ref 0.0–5.0)
HEMATOCRIT: 48.3 % (ref 39.0–52.0)
HEMOGLOBIN: 15.6 g/dL (ref 13.0–17.0)
LYMPHS PCT: 30.3 % (ref 12.0–46.0)
Lymphs Abs: 2.3 10*3/uL (ref 0.7–4.0)
MCHC: 32.3 g/dL (ref 30.0–36.0)
MCV: 86.7 fl (ref 78.0–100.0)
MONOS PCT: 9.7 % (ref 3.0–12.0)
Monocytes Absolute: 0.7 10*3/uL (ref 0.1–1.0)
Neutro Abs: 4.3 10*3/uL (ref 1.4–7.7)
Neutrophils Relative %: 57.4 % (ref 43.0–77.0)
PLATELETS: 250 10*3/uL (ref 150.0–400.0)
RBC: 5.57 Mil/uL (ref 4.22–5.81)
RDW: 17.1 % — AB (ref 11.5–15.5)
WBC: 7.4 10*3/uL (ref 4.0–10.5)

## 2017-02-10 LAB — PSA, MEDICARE: PSA: 0.22 ng/mL (ref 0.10–4.00)

## 2017-02-10 NOTE — Progress Notes (Signed)
Pre visit review using our clinic review tool, if applicable. No additional management support is needed unless otherwise documented below in the visit note. 

## 2017-02-10 NOTE — Progress Notes (Signed)
Office Note 02/10/2017  CC:  Chief Complaint  Patient presents with  . Annual Exam    Pt is fasting.     HPI:  [name] is a 71 y.o. White male who is here for annual health maintenance exam.  Eye exam: UTD. Dental preventatives: UTD.  Still dealing with hip and back pain.  Got injection in hip recently--no help. Goes to pain mgmt for shot in back soon.  He is going through process of getting spinal cord stimulator. PT ended last week--reached insurance limit. He is still walking very slowly using a walker.  No falls.  Still on xarelto, no probs with this. He wants me to do PSA and DRE today--says he wonders why he needs to still f/u with urology since he has not had any sign of prostate ca recurrence in about 5 yrs.   Past Medical History:  Diagnosis Date  . Anterior dislocation of right shoulder 09/2013   Reduced in ED under sedation.  Dislocated posteriorly 2015, ? partial RC tear, has f/u planned with Dr. Marlou Sa--? surgery? possible plan for 2016.  [name] Blood donor, whole blood    Has donated approx 30 gallons in his lifetime  . 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  [name] Chronic renal insufficiency, stage II (mild)   . Chronic venous insufficiency    +compression hose  . DDD (degenerative disc disease), lumbar 2017   Dr. Noe Gens ortho.  He is now wearing a new leg brace.  Has had ESI and will get another.  [name] DVT (deep venous thrombosis) ([place]) 06/14/2016   bilateral LL's; in post-op setting s/p hip surgery.  Dx'd in the ED, where xarelto was rx'd.  . Foot drop, left    s/p hip fracture 1969  . 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  As of urol f/u 08/2014, plan is for me to follow PSAs and fax results to Dr. Junious Silk: PSA 02/2015 was  0.21 (stable).  Stable on f/u with Dr. Junious Silk 08/2015 and 11/2016.  [name] Hx of adenomatous colonic polyps    polypectomy 2007; 2012; 2015 (+adenomatous, no high grade dysplasia); recall 5 yrs  . Osteoarthritis    knees and hips primarily  . Toe infection 05/27/2013   Left great toe and right 2nd toe  . Trochanteric bursitis of left hip    Steroid injection by ortho 09/10/16  . Urge incontinence    vesicare helpful    Past Surgical History:  Procedure Laterality Date  . ANTERIOR HIP REVISION Left 06/07/2016   Procedure: LEFT ANTERIOR HIP ACETABULAR REVISION;  Surgeon: Mcarthur Rossetti, MD;  Location: [facility];  Service: Orthopedics;  Laterality: Left;  . arthroscopy left shoulder Left 07  . bilateral carpal tunnel release  2003  . BILATERAL CARPAL TUNNEL RELEASE    . COLONOSCOPY  2007;2012;2015   04/12/2014 Tubular adenoma x 1; per Dr. Henrene Pastor recall 5 yrs  . FRACTURE SURGERY Left   . JOINT REPLACEMENT Left    hip  . LUMBAR LAMINECTOMY/DECOMPRESSION MICRODISCECTOMY Left 03/28/2016   Procedure: Left Lumbar three-four Laminoforaminotomy;  Surgeon: Erline Levine, MD;  Location: [facility];  Service: Neurosurgery;  Laterality: Left;  left  . RADIOACTIVE SEED IMPLANT  2012  . ROTATOR CUFF REPAIR  2006   right  .  seed implant prostate  2012  . SHOULDER ARTHROSCOPY Left   . TOTAL HIP ARTHROPLASTY  2008   left ([place])  . VASECTOMY      Family History  Problem Relation Age of Onset  . Lung cancer Mother   . Heart disease Father     Social History   Social History  . Marital status: Married    Spouse name: N/A  . Number of children: N/A  . Years of education: N/A   Occupational History  . Not on file.   Social History Main Topics  . Smoking status: Never Smoker  . Smokeless tobacco: Never Used  . Alcohol use No  . Drug use: No  . Sexual activity: Not on file   Other Topics Concern  . Not on file   Social History Narrative   Married, 2 biologic children, 2  adopted, 5 GC.   Lives in [place].  Worked 35 yrs at [employer].   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   No formal exercise.  No T/A/Ds.   Enjoys woodworking.    Outpatient Medications Prior to Visit  Medication Sig Dispense Refill  . gabapentin (NEURONTIN) 100 MG capsule TAKE 1 CAPSULE (100 MG TOTAL) BY MOUTH 3 (THREE) TIMES DAILY. 60 capsule 1  . rivaroxaban (XARELTO) 10 MG TABS tablet Take 1 tablet (10 mg total) by mouth daily. 30 tablet 10  . solifenacin (VESICARE) 5 MG tablet Take 5 mg by mouth daily.    . traMADol (ULTRAM) 50 MG tablet Take 50 mg by mouth every 8 (eight) hours as needed for moderate pain.   0   No facility-administered medications prior to visit.     No Known Allergies  ROS Review of Systems  Constitutional: Negative for appetite change, chills, fatigue and fever.  HENT: Negative for congestion, dental problem, ear pain and sore throat.   Eyes: Negative for discharge, redness and visual disturbance.  Respiratory: Negative for cough, chest tightness, shortness of breath and wheezing.   Cardiovascular: Negative for chest pain, palpitations and leg swelling.  Gastrointestinal: Negative for abdominal pain, blood in stool, diarrhea, nausea and vomiting.  Genitourinary: Negative for difficulty urinating, dysuria, flank pain, frequency, hematuria and urgency.  Musculoskeletal: Positive for arthralgias (hips) and back pain. Negative for joint swelling, myalgias and neck stiffness.  Skin: Negative for pallor and rash.  Neurological: Negative for dizziness, speech difficulty, weakness and headaches.  Hematological: Negative for adenopathy. Does not bruise/bleed easily.  Psychiatric/Behavioral: Negative for confusion and sleep disturbance. The patient is not nervous/anxious.     PE; Blood pressure 140/80, pulse (!) 49, temperature 97.8 F (36.6 C), temperature source Oral, resp. rate 16, height 6\' 3"  (1.905 m), weight 244 lb 8 oz (110.9 kg), SpO2  99 %. Gen: Alert, well appearing.  Patient is oriented to person, place, time, and situation. AFFECT: pleasant, lucid thought and speech. ENT: Ears: EACs clear, normal epithelium.  TMs with good light reflex and landmarks bilaterally.  Eyes: no injection, icteris, swelling, or exudate.  EOMI, PERRLA. Nose: no drainage or turbinate edema/swelling.  No injection or focal lesion.  Mouth: lips without lesion/swelling.  Oral mucosa pink and moist.  Dentition intact and without obvious caries or gingival swelling.  Oropharynx without erythema, exudate, or swelling.  Neck: supple/nontender.  No LAD, mass, or TM.  Carotid pulses 2+ bilaterally, without bruits. CV: RRR, no m/r/g.   LUNGS: CTA bilat, nonlabored resps, good aeration in all lung fields. ABD: soft, NT,  ND, BS normal.  No hepatospenomegaly or mass.  No bruits. EXT: no clubbing, cyanosis, or edema.  Musculoskeletal: no joint swelling, erythema, warmth, or tenderness.  ROM of all joints intact. Skin - no sores or suspicious lesions or rashes or color changes Rectal exam: negative without mass, lesions or tenderness, PROSTATE EXAM: smooth and symmetric without nodules or tenderness.   Pertinent labs:  Lab Results  Component Value Date   TSH 1.19 02/09/2016   Lab Results  Component Value Date   WBC 11.8 (H) 06/10/2016   HGB 8.6 (L) 06/11/2016   HCT 26.4 (L) 06/11/2016   MCV 75.8 (L) 06/10/2016   PLT 298 06/10/2016   Lab Results  Component Value Date   CREATININE 1.50 (H) 06/08/2016   BUN 19 06/08/2016   NA 134 (L) 06/08/2016   K 4.8 06/08/2016   CL 102 06/08/2016   CO2 25 06/08/2016   Lab Results  Component Value Date   ALT 14 02/09/2016   AST 12 02/09/2016   ALKPHOS 86 02/09/2016   BILITOT 0.5 02/09/2016   Lab Results  Component Value Date   CHOL 159 02/09/2016   Lab Results  Component Value Date   HDL 36.80 (L) 02/09/2016   Lab Results  Component Value Date   LDLCALC 109 (H) 02/09/2016   Lab Results   Component Value Date   TRIG 68.0 02/09/2016   Lab Results  Component Value Date   CHOLHDL 4 02/09/2016   Lab Results  Component Value Date   PSA 0.20 02/09/2016   PSA 0.21 02/08/2015   PSA 15.60 (H) 05/21/2010   Lab Results  Component Value Date   HGBA1C 5.4 05/27/2013    ASSESSMENT AND PLAN:   Health maintenance exam: Reviewed age and gender appropriate health maintenance issues (prudent diet, regular exercise, health risks of tobacco and excessive alcohol, use of seatbelts, fire alarms in home, use of sunscreen).  Also reviewed age and gender appropriate health screening as well as vaccine recommendations. Vaccines UTD. Fasting HP labs done today. Colon ca screening/hx of adenomatous polyps: next TCS due 04/2019. Prostate cancer : disease surveillance has shown no sign of recurrence since getting seed implants: check DRE and PSA today. Hx of bilat LE DVTs: plan is to continue low dose xarelto until 07/2017.  An After Visit Summary was printed and given to the patient.  FOLLOW UP:  Return in about 5 months (around 07/13/2017) for f/u DVTs/xarelto.  Signed:  Crissie Sickles, MD           02/10/2017

## 2017-02-10 NOTE — Patient Instructions (Signed)
 Health Maintenance, Male A healthy lifestyle and preventive care is important for your health and wellness. Ask your health care provider about what schedule of regular examinations is right for you. What should I know about weight and diet?  Eat a Healthy Diet  Eat plenty of vegetables, fruits, whole grains, low-fat dairy products, and lean protein.  Do not eat a lot of foods high in solid fats, added sugars, or salt. Maintain a Healthy Weight  Regular exercise can help you achieve or maintain a healthy weight. You should:  Do at least 150 minutes of exercise each week. The exercise should increase your heart rate and make you sweat (moderate-intensity exercise).  Do strength-training exercises at least twice a week. Watch Your Levels of Cholesterol and Blood Lipids  Have your blood tested for lipids and cholesterol every 5 years starting at 72 years of age. If you are at high risk for heart disease, you should start having your blood tested when you are 72 years old. You may need to have your cholesterol levels checked more often if:  Your lipid or cholesterol levels are high.  You are older than 72 years of age.  You are at high risk for heart disease. What should I know about cancer screening? Many types of cancers can be detected early and may often be prevented. Lung Cancer  You should be screened every year for lung cancer if:  You are a current smoker who has smoked for at least 30 years.  You are a former smoker who has quit within the past 15 years.  Talk to your health care provider about your screening options, when you should start screening, and how often you should be screened. Colorectal Cancer  Routine colorectal cancer screening usually begins at 72 years of age and should be repeated every 5-10 years until you are 72 years old. You may need to be screened more often if early forms of precancerous polyps or small growths are found. Your health care provider  may recommend screening at an earlier age if you have risk factors for colon cancer.  Your health care provider may recommend using home test kits to check for hidden blood in the stool.  A small camera at the end of a tube can be used to examine your colon (sigmoidoscopy or colonoscopy). This checks for the earliest forms of colorectal cancer. Prostate and Testicular Cancer  Depending on your age and overall health, your health care provider may do certain tests to screen for prostate and testicular cancer.  Talk to your health care provider about any symptoms or concerns you have about testicular or prostate cancer. Skin Cancer  Check your skin from head to toe regularly.  Tell your health care provider about any new moles or changes in moles, especially if:  There is a change in a mole's size, shape, or color.  You have a mole that is larger than a pencil eraser.  Always use sunscreen. Apply sunscreen liberally and repeat throughout the day.  Protect yourself by wearing long sleeves, pants, a wide-brimmed hat, and sunglasses when outside. What should I know about heart disease, diabetes, and high blood pressure?  If you are 18-39 years of age, have your blood pressure checked every 3-5 years. If you are 40 years of age or older, have your blood pressure checked every year. You should have your blood pressure measured twice-once when you are at a hospital or clinic, and once when you are not at   a hospital or clinic. Record the average of the two measurements. To check your blood pressure when you are not at a hospital or clinic, you can use:  An automated blood pressure machine at a pharmacy.  A home blood pressure monitor.  Talk to your health care provider about your target blood pressure.  If you are between 45-79 years old, ask your health care provider if you should take aspirin to prevent heart disease.  Have regular diabetes screenings by checking your fasting blood sugar  level.  If you are at a normal weight and have a low risk for diabetes, have this test once every three years after the age of 45.  If you are overweight and have a high risk for diabetes, consider being tested at a younger age or more often.  A one-time screening for abdominal aortic aneurysm (AAA) by ultrasound is recommended for men aged 65-75 years who are current or former smokers. What should I know about preventing infection? Hepatitis B  If you have a higher risk for hepatitis B, you should be screened for this virus. Talk with your health care provider to find out if you are at risk for hepatitis B infection. Hepatitis C  Blood testing is recommended for:  Everyone born from 1945 through 1965.  Anyone with known risk factors for hepatitis C. Sexually Transmitted Diseases (STDs)  You should be screened each year for STDs including gonorrhea and chlamydia if:  You are sexually active and are younger than 72 years of age.  You are older than 72 years of age and your health care provider tells you that you are at risk for this type of infection.  Your sexual activity has changed since you were last screened and you are at an increased risk for chlamydia or gonorrhea. Ask your health care provider if you are at risk.  Talk with your health care provider about whether you are at high risk of being infected with HIV. Your health care provider may recommend a prescription medicine to help prevent HIV infection. What else can I do?  Schedule regular health, dental, and eye exams.  Stay current with your vaccines (immunizations).  Do not use any tobacco products, such as cigarettes, chewing tobacco, and e-cigarettes. If you need help quitting, ask your health care provider.  Limit alcohol intake to no more than 2 drinks per day. One drink equals 12 ounces of beer, 5 ounces of wine, or 1 ounces of hard liquor.  Do not use street drugs.  Do not share needles.  Ask your health  care provider for help if you need support or information about quitting drugs.  Tell your health care provider if you often feel depressed.  Tell your health care provider if you have ever been abused or do not feel safe at home. This information is not intended to replace advice given to you by your health care provider. Make sure you discuss any questions you have with your health care provider. Document Released: 03/21/2008 Document Revised: 05/22/2016 Document Reviewed: 06/27/2015 Elsevier Interactive Patient Education  2017 Elsevier Inc.  

## 2017-02-12 ENCOUNTER — Encounter: Payer: Self-pay | Admitting: Family Medicine

## 2017-02-13 ENCOUNTER — Ambulatory Visit: Payer: PPO | Admitting: Psychology

## 2017-02-20 DIAGNOSIS — G5702 Lesion of sciatic nerve, left lower limb: Secondary | ICD-10-CM | POA: Diagnosis not present

## 2017-02-20 DIAGNOSIS — M5416 Radiculopathy, lumbar region: Secondary | ICD-10-CM | POA: Diagnosis not present

## 2017-02-24 ENCOUNTER — Encounter: Payer: PPO | Attending: Psychology | Admitting: Psychology

## 2017-02-24 DIAGNOSIS — M25552 Pain in left hip: Secondary | ICD-10-CM | POA: Diagnosis not present

## 2017-02-24 DIAGNOSIS — M7062 Trochanteric bursitis, left hip: Secondary | ICD-10-CM | POA: Insufficient documentation

## 2017-02-24 DIAGNOSIS — E7800 Pure hypercholesterolemia, unspecified: Secondary | ICD-10-CM | POA: Diagnosis not present

## 2017-02-24 DIAGNOSIS — Z8546 Personal history of malignant neoplasm of prostate: Secondary | ICD-10-CM | POA: Diagnosis not present

## 2017-02-24 DIAGNOSIS — Z86718 Personal history of other venous thrombosis and embolism: Secondary | ICD-10-CM | POA: Insufficient documentation

## 2017-02-24 DIAGNOSIS — Z96649 Presence of unspecified artificial hip joint: Secondary | ICD-10-CM | POA: Insufficient documentation

## 2017-02-24 DIAGNOSIS — G894 Chronic pain syndrome: Secondary | ICD-10-CM | POA: Diagnosis not present

## 2017-02-27 ENCOUNTER — Encounter: Payer: Self-pay | Admitting: Psychology

## 2017-02-27 ENCOUNTER — Encounter (HOSPITAL_BASED_OUTPATIENT_CLINIC_OR_DEPARTMENT_OTHER): Payer: PPO | Admitting: Psychology

## 2017-02-27 DIAGNOSIS — G894 Chronic pain syndrome: Secondary | ICD-10-CM

## 2017-02-27 NOTE — Progress Notes (Signed)
Neuropsychological Consultation   Patient:   [name]   DOB:   13-Jan-1946  MR Number:  [id number]  Location:  [facility] [address] [place] [id number] [place] [address] Dept: [phone]           Date of Service:   02/28/2015  Start Time:   1 PM End Time:   2 PM  Provider/Observer:  Ilean Skill, Psy.D.       Clinical Neuropsychologist       Billing Code/Service: [id number] 4 Units  Chief Complaint:    The patient reports that he is dealing with significant pain and some mild anxiety that is likely subsequent to the pain. The patient was referred for psychological evaluation as part of the preliminary workup to determine the appropriateness for spinal cord stimulator trialing.  Reason for Service:  The patient is a 71 year old male who had back surgery in June 2017. The patient also had hip replacement surgery in 2008 with a revision done in September 2017. The patient reports that the only medications he takes for this residual chronic pain is Tylenol and gabapentin with an occasional OxyContin. This OxyContin is on a very limited basis and are ones that were left over from the surgery that he had not taken prior.  The patient denies any issues with depression or anxiety and denies any history of bipolar disorder. He reports that he has a very stable and happy marriage with good children. The patient reports that he has done well with his retirement but the fact that he is continuing to have issues with regard to residual/chronic pain continue to be problematic. The patient reports that he has had issues with his hip to go back to 1969. The patient reports that he had an injury where he broke his hip and damaged the sciatic nerve. The patient reports that this chronic pain in his hip and his back are the reason for looking at evaluation for spinal cord stimulator trials.  He reports that his chronic daily pain has been persistent since December 2017. He is starting to use a walker 24/7.  Current Status:  The patient denies any significant psychiatric issues or psychosocial issues that are particularly problematic to him. He does acknowledge some low levels of anxiety that he attributes to the stress of the chronic pain and not being able to ambulate like he needs to. The patient reports that he is using a walker all of the time now.  Reliability of Information: Information is derived from a 1 hour face-to-face clinical interview with the patient as well as review of available medical records.  Behavioral Observation: [name]  presents as a 71 y.o.-year-old Right Caucasian Male who appeared his stated age. his dress was Appropriate and he was Well Groomed and his manners were Appropriate to the situation.  his participation was indicative of Appropriate and Attentive behaviors.  There were  physical disabilities noted related to gait and ambulation.  he displayed an appropriate level of cooperation and motivation.     Interactions:    Active Appropriate and Attentive  Attention:   within normal limits and attention span and concentration were age appropriate  Memory:   within normal limits; recent and remote memory intact  Visuo-spatial:  within normal limits  Speech (Volume):  normal  Speech:   normal;   Thought Process:  Coherent and Relevant  Though Content:  WNL;  Orientation:   person, place, time/date and situation  Judgment:   Good  Planning:   Good  Affect:    Congruent with situation.  Mood:    NA  Insight:   Good  Intelligence:   high  Marital Status/Living: The patient currently lives with his wife. They have 4 children who are all adult living on the wrong. The patient is a 55 year old son, 8 year old daughter, 8 year old son and a 61 year old daughter.  The patient was born and raised in [place].  Both of his parents are deceased.  Current Employment: The patient is retired.  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  Substance Use:  No concerns of substance abuse are reported.  There no indications of any substance abuse. He does take narcotic pain medications on extremely limited basis and has not even finished some of the medications that were prescribed back almost a year ago.  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  Medical History:   Past Medical History:  Diagnosis Date  . Anterior dislocation of right shoulder 09/2013   Reduced in ED under sedation.  Dislocated posteriorly 2015, ? partial RC tear, has f/u planned with Dr. Marlou Sa--? surgery? possible plan for 2016.  [name] Blood donor, whole blood    Has donated approx 30 gallons in his lifetime  . 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  [name] Chronic renal insufficiency, stage II (mild)   . Chronic venous insufficiency    +compression hose  . DDD (degenerative disc disease), lumbar 2017   Dr. Noe Gens ortho.  He is now wearing a new leg brace.  Has had ESI and will get another.  [name] DVT (deep venous thrombosis) ([place]) 06/14/2016   bilateral LL's; in post-op setting s/p hip surgery.  Dx'd in the ED, where xarelto was rx'd.  . Foot drop, left    s/p hip fracture 1969  . 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  As of urol f/u 08/2014, plan is for me to follow PSAs and fax results to Dr. Junious Silk: PSA 02/2015 was 0.21 (stable).  Stable on f/u with Dr. Junious Silk 08/2015 and  11/2016.  [name] Hx of adenomatous colonic polyps    polypectomy 2007; 2012; 2015 (+adenomatous, no high grade dysplasia); recall 5 yrs  . Hypercholesterolemia 02/2017   Recommended statin 02/10/17 but pt declined, wanted to do TLC trial first.  . Osteoarthritis    knees and hips primarily  . Toe infection 05/27/2013   Left great toe and right 2nd toe  . Trochanteric bursitis of left hip    Steroid injection by ortho 09/10/16  . Urge incontinence    vesicare helpful           Abuse/Trauma History: The patient denies any history of trauma or abuse.  Psychiatric History:  The patient denies any prior psychiatric history.  Family Med/Psych History:  Family History  Problem Relation Age of Onset  . Lung cancer Mother   . Heart disease Father     Risk of Suicide/Violence:  virtually non-existent there are no indications of homicidal or suicidal ideation.  Impression/DX:  The patient is a 71 year old male who was referred for psychological evaluation as part of a preliminary/standard workup assessing the appropriateness of spinal cord stimulator trialing and possible implantation. During the clinical interview, there were no indications of any significant psychiatric illness or significant psychosocial features that would have a deleterious or inhibitory role in getting accurate information during the trialing period Or difficulty with actual and implantation  Disposition/Plan:  The patient will complete the [place] multiphasic personality inventory as well as the pain patient profile (P3)  Diagnosis:    Chronic pain syndrome         Electronically Signed   _______________________ Ilean Skill, Psy.D.

## 2017-02-27 NOTE — Progress Notes (Signed)
Patient:  [name]   DOB: 1946-09-22  MR Number: [id number]  Location: [facility] [address] [place] [address] Dept: [phone]  Start: 3 PM End: 4 PM  Provider/Observer:     Edgardo Roys PSYD  Chief Complaint:      Chief Complaint  Patient presents with  . Pain    Reason For Service:     The patient is a 71 year old male who had back surgery in June 2017. The patient also had hip replacement surgery in 2008 with a revision done in September 2017. The patient reports that the only medications he takes for this residual chronic pain is Tylenol and gabapentin with an occasional OxyContin. This OxyContin is on a very limited basis and are ones that were left over from the surgery that he had not taken prior.  The patient denies any issues with depression or anxiety and denies any history of bipolar disorder. He reports that he has a very stable and happy marriage with good children. The patient reports that he has done well with his retirement but the fact that he is continuing to have issues with regard to residual/chronic pain continue to be problematic. The patient reports that he has had issues with his hip to go back to 1969. The patient reports that he had an injury where he broke his hip and damaged the sciatic nerve. The patient reports that this chronic pain in his hip and his back are the reason for looking at evaluation for spinal cord stimulator trials. He reports that his chronic daily pain has been persistent since December 2017. He is starting to use a walker 24/7.  Testing Administered:  The patient was administered the [place] multiphasic personality inventory as well as the P3 inventory.  Participation Level:   Active  Participation Quality:  Appropriate and Attentive      Behavioral Observation:  Well Groomed, Alert, and  Appropriate.   Test Results:   Initially, the patient completed the [place] multiphasic personality inventory-2. The resulting validity scale as suggested patient carefully read and questioned and made attempts to understand what the question was addressing. He did produce a profile that while valid suggested he did have some concerns about the purpose of the questionnaire and what the questions individually were asking. However, this does appear to be a valid interpretation. The profile has been corrected for this response style.  The resulting clinical scales suggest that there is a mild elevation with regard to specific physical complaints and physical difficulties. This is consistent with his reason for referral and describes a history of chronic pain in his back and hip. There are no indications of significant depression or anxiety, manic episodes, excessive underlying anxiety brother psychiatric symptoms globally. Further analysis utilizing content supplemental scales highlight the specific focus on health concerns with no other indication of issues related to depression or anxiety, anger or agitation, or family psychosocial dysfunction.  Further analysis utilizing supplemental scales do show that the patient has some higher concern about his social status and social responsibility but this is still in the high average range. There are no indications of any significant individual specific psychiatric or psychosocial difficulties. Both of his PTSD scales are in the average range with no indication of prior unresolved trauma or traumatic experiences. The patient does describe issues with physical difficulties sitting dysfunction as well as reduced energy and motivation. No  other elements were identified his problematic in this objective assessment.  The patient then completed the P3 inventory. His profile was designed and performed on a population of both community/2 samples as well as a specific  chronic pain patient population. The resulting validity scales produced from this administration suggest that the patient approach this inventory in an honest and straightforward manner without attempts to either exaggerate or minimize his symptoms. As far as specific scales, the patient does have an elevation on the somatizations scale relative to a clinical non-pain population. However, the patient's performance was consistent with the normative average for pain patient population without somatizations issues. Therefore there does not appear to be any exaggeration or catastrophizing of his symptoms. Both the depression scale and the anxiety scale were consistent with a community-based normative sample. They're actually below those typically found with chronic pain patients.   Summary of Results:   The results of the current objective psychological assessment do not indicate any significant psychological or psychiatric symptoms or psychosocial variables that would be problematic or disruptive towards the patient's functioning or adaptive abilities. There are no indications of depression, anxiety, underlying bipolar disorder or other significant psychiatric illness. The patient describes a life that is very stable without significant marital distress or other issues with family discord. The patient does have a very supportive and structured support network.  Impression/Diagnosis:   The results of the current objective psychological evaluation as well as inclusions of information derived from a 1 hour face-to-face clinical interview and review of available medical records do suggest that this patient is an excellent candidate for spinal cord stimulation trialing and/or implantation from a psychological/psychiatric perspective. There are no indications of significant psychiatric illness or difficulties or specific word disruptive psychosocial variables that would hinder an accurate assessment as to the  effectiveness of the spinal cord stimulator trial during trialing. There are no indications that there are would be issues during actual implantation successful trialing is accomplished.  Diagnosis:    Axis I: Chronic pain syndrome   Ilean Skill, Psy.D. Neuropsychologist

## 2017-03-14 ENCOUNTER — Encounter: Payer: Self-pay | Admitting: Family Medicine

## 2017-04-01 DIAGNOSIS — G894 Chronic pain syndrome: Secondary | ICD-10-CM | POA: Diagnosis not present

## 2017-04-01 DIAGNOSIS — M5416 Radiculopathy, lumbar region: Secondary | ICD-10-CM | POA: Diagnosis not present

## 2017-04-01 DIAGNOSIS — M961 Postlaminectomy syndrome, not elsewhere classified: Secondary | ICD-10-CM | POA: Diagnosis not present

## 2017-04-01 DIAGNOSIS — G5702 Lesion of sciatic nerve, left lower limb: Secondary | ICD-10-CM | POA: Diagnosis not present

## 2017-04-08 DIAGNOSIS — Z9689 Presence of other specified functional implants: Secondary | ICD-10-CM | POA: Diagnosis not present

## 2017-04-08 DIAGNOSIS — G894 Chronic pain syndrome: Secondary | ICD-10-CM | POA: Diagnosis not present

## 2017-04-08 DIAGNOSIS — R030 Elevated blood-pressure reading, without diagnosis of hypertension: Secondary | ICD-10-CM | POA: Diagnosis not present

## 2017-04-08 DIAGNOSIS — M961 Postlaminectomy syndrome, not elsewhere classified: Secondary | ICD-10-CM | POA: Diagnosis not present

## 2017-04-25 DIAGNOSIS — G894 Chronic pain syndrome: Secondary | ICD-10-CM | POA: Diagnosis not present

## 2017-04-25 DIAGNOSIS — M961 Postlaminectomy syndrome, not elsewhere classified: Secondary | ICD-10-CM | POA: Diagnosis not present

## 2017-06-05 DIAGNOSIS — Z6832 Body mass index (BMI) 32.0-32.9, adult: Secondary | ICD-10-CM | POA: Diagnosis not present

## 2017-06-05 DIAGNOSIS — M961 Postlaminectomy syndrome, not elsewhere classified: Secondary | ICD-10-CM | POA: Diagnosis not present

## 2017-06-05 DIAGNOSIS — I10 Essential (primary) hypertension: Secondary | ICD-10-CM | POA: Diagnosis not present

## 2017-06-05 DIAGNOSIS — Z9689 Presence of other specified functional implants: Secondary | ICD-10-CM | POA: Diagnosis not present

## 2017-06-07 HISTORY — PX: SPINAL CORD STIMULATOR IMPLANT: SHX2422

## 2017-06-13 ENCOUNTER — Telehealth: Payer: Self-pay | Admitting: Family Medicine

## 2017-06-13 NOTE — Telephone Encounter (Signed)
Dr Anitra Lauth was available to sign placard.

## 2017-06-13 NOTE — Telephone Encounter (Signed)
Paperwork for handicap placard signed today.

## 2017-06-13 NOTE — Telephone Encounter (Signed)
Patient came into office today requesting Permanent placard.  Please advise.

## 2017-07-03 DIAGNOSIS — M961 Postlaminectomy syndrome, not elsewhere classified: Secondary | ICD-10-CM | POA: Diagnosis not present

## 2017-07-08 DIAGNOSIS — M961 Postlaminectomy syndrome, not elsewhere classified: Secondary | ICD-10-CM | POA: Diagnosis not present

## 2017-07-10 ENCOUNTER — Encounter: Payer: Self-pay | Admitting: Family Medicine

## 2017-07-10 ENCOUNTER — Ambulatory Visit (INDEPENDENT_AMBULATORY_CARE_PROVIDER_SITE_OTHER): Payer: PPO | Admitting: Family Medicine

## 2017-07-10 VITALS — BP 137/68 | HR 55 | Temp 97.6°F | Resp 16 | Ht 75.0 in | Wt 258.8 lb

## 2017-07-10 DIAGNOSIS — M961 Postlaminectomy syndrome, not elsewhere classified: Secondary | ICD-10-CM | POA: Diagnosis not present

## 2017-07-10 DIAGNOSIS — Z86718 Personal history of other venous thrombosis and embolism: Secondary | ICD-10-CM

## 2017-07-10 NOTE — Progress Notes (Signed)
OFFICE VISIT  07/10/2017   CC:  Chief Complaint  Patient presents with  . Follow-up    DVT and Xarelto    HPI:    Patient is a 71 y.o. Caucasian male who presents for 5 mo f/u DVT, on xarelto. He had extensive bilat LE DVTs dx'd by LE doppler on 06/14/16 in the setting of post-op from left anterior hip acetabular revision. He took 6 mo of standard dosing of xarelto.  Then, at 6 mo after this we repeated his LE dopplers on 01/13/17: IMPRESSION: 1. No evidence of acute DVT within either lower extremity. 2. 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  At that time we switched him over to low dose xarelto with plans to continue this for an additional year (end 01/2018).   Of note, he is feeling much better regarding chronic L hip and L leg pain-got a spinal cord stimulator put in about 10 weeks ago by Dr. Lovenia Shuck at  Pain mgmt center. He sees no signif leg swelling.  Wears compression hose daily.  Past Medical History:  Diagnosis Date  . Anterior dislocation of right shoulder 09/2013   Reduced in ED under sedation.  Dislocated posteriorly 2015, ? partial RC tear, has f/u planned with Dr. Marlou Sa--? surgery? possible plan for 2016.  [name] Blood donor, whole blood    Has donated approx 30 gallons in his lifetime  . 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  [name] Chronic pain syndrome    Candidate for spinal cord stimulator (03/2017)  . Chronic renal insufficiency, stage II (mild)   . Chronic venous insufficiency    +compression hose  . DDD (degenerative disc disease), lumbar 2017   Dr. Noe Gens ortho.  He is now wearing a new leg brace.  Has had ESI and will get another.  [name] DVT (deep venous thrombosis) ([place]) 06/14/2016   bilateral LL's;  in post-op setting s/p hip surgery.  Dx'd in the ED, where xarelto was rx'd.  . Foot drop, left    s/p hip fracture 1969  . 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  As of urol f/u 08/2014, plan is for me to follow PSAs and fax results to Dr. Junious Silk: PSA 02/2015 was 0.21 (stable).  Stable on f/u with Dr. Junious Silk 08/2015 and 11/2016.  [name] Hx of adenomatous colonic polyps    polypectomy 2007; 2012; 2015 (+adenomatous, no high grade dysplasia); recall 5 yrs  . Hypercholesterolemia 02/2017   Recommended statin 02/10/17 but pt declined, wanted to do TLC trial first.  . Osteoarthritis    knees and hips primarily  . Toe infection 05/27/2013   Left great toe and right 2nd toe  . Trochanteric bursitis of left hip    Steroid injection by ortho 09/10/16  . Urge incontinence    vesicare helpful    Past Surgical History:  Procedure Laterality Date  . ANTERIOR HIP REVISION Left 06/07/2016   Procedure: LEFT ANTERIOR HIP ACETABULAR REVISION;  Surgeon: Mcarthur Rossetti, MD;  Location: [facility];  Service: Orthopedics;  Laterality: Left;  . arthroscopy left shoulder Left 07  . bilateral carpal tunnel release  2003  . BILATERAL CARPAL TUNNEL RELEASE    . COLONOSCOPY  2007;2012;2015  04/12/2014 Tubular adenoma x 1; per Dr. Henrene Pastor recall 5 yrs  . FRACTURE SURGERY Left   . JOINT REPLACEMENT Left    hip  . LUMBAR LAMINECTOMY/DECOMPRESSION MICRODISCECTOMY Left 03/28/2016   Procedure: Left Lumbar three-four Laminoforaminotomy;  Surgeon: Erline Levine, MD;  Location: [facility];  Service: Neurosurgery;  Laterality: Left;  left  . RADIOACTIVE SEED IMPLANT  2012  . ROTATOR CUFF REPAIR  2006   right  . seed implant prostate  2012  . SHOULDER ARTHROSCOPY Left   . TOTAL HIP ARTHROPLASTY  2008   left (cobalt chrome)  . VASECTOMY      Outpatient Medications Prior to Visit  Medication Sig Dispense Refill  . gabapentin (NEURONTIN)  100 MG capsule TAKE 1 CAPSULE (100 MG TOTAL) BY MOUTH 3 (THREE) TIMES DAILY. 60 capsule 1  . rivaroxaban (XARELTO) 10 MG TABS tablet Take 1 tablet (10 mg total) by mouth daily. 30 tablet 10  . solifenacin (VESICARE) 5 MG tablet Take 5 mg by mouth daily.    [name] oxyCODONE-acetaminophen (PERCOCET/ROXICET) 5-325 MG tablet Take 1-2 tablets by mouth 2 (two) times daily as needed.  0  . traMADol (ULTRAM) 50 MG tablet Take 50 mg by mouth every 8 (eight) hours as needed for moderate pain.   0   No facility-administered medications prior to visit.     No Known Allergies  ROS As per HPI  PE: Blood pressure 137/68, pulse (!) 55, temperature 97.6 F (36.4 C), temperature source Oral, resp. rate 16, height 6\' 3"  (1.905 m), weight 258 lb 12 oz (117.4 kg), SpO2 96 %. Gen: Alert, well appearing.  Patient is oriented to person, place, time, and situation. AFFECT: pleasant, lucid thought and speech. CV: RRR, no m/r/g.   LUNGS: CTA bilat, nonlabored resps, good aeration in all lung fields. EXT: no clubbing or cyanosis.  He is wearing custom fit compression hose. No edema palpable, no tenderness.  LABS:    Chemistry      Component Value Date/Time   NA 137 02/10/2017 0833   K 4.7 02/10/2017 0833   CL 105 02/10/2017 0833   CO2 27 02/10/2017 0833   BUN 21 02/10/2017 0833   CREATININE 1.01 02/10/2017 0833   CREATININE 1.28 05/27/2013 1619      Component Value Date/Time   CALCIUM 10.2 02/10/2017 0833   ALKPHOS 81 02/10/2017 0833   AST 12 02/10/2017 0833   ALT 14 02/10/2017 0833   BILITOT 0.6 02/10/2017 0833     Lab Results  Component Value Date   HGBA1C 5.4 05/27/2013   Lab Results  Component Value Date   WBC 7.4 02/10/2017   HGB 15.6 02/10/2017   HCT 48.3 02/10/2017   MCV 86.7 02/10/2017   PLT 250.0 02/10/2017   IMPRESSION AND PLAN:  1) Acute bilat DVTs 06/2017 s/p orthopedic surgery; last doppler 6 mo ago showed bilat chronic DVTs so we kept him on low dose xarelto for 6 additional  months.  Will recheck LE venous doppler (bilat) and if no acute DVT or enlargement of chronic DVT, then we'll d/c xarelto.  2) Chronic L hip/leg pain: spinal cord stimulator + pain mgmt clinic working out very well for him.  An After Visit Summary was printed and given to the patient.  FOLLOW UP: Return in about 7 months (around 02/07/2018) for annual CPE (fasting).  Signed:  Crissie Sickles, MD           07/10/2017

## 2017-07-14 ENCOUNTER — Ambulatory Visit (HOSPITAL_BASED_OUTPATIENT_CLINIC_OR_DEPARTMENT_OTHER)
Admission: RE | Admit: 2017-07-14 | Discharge: 2017-07-14 | Disposition: A | Discharge status: Home / Self Care | Payer: PPO | Source: Ambulatory Visit | Attending: Family Medicine | Admitting: Family Medicine

## 2017-07-14 DIAGNOSIS — I82511 Chronic embolism and thrombosis of right femoral vein: Secondary | ICD-10-CM | POA: Diagnosis not present

## 2017-07-14 DIAGNOSIS — I82512 Chronic embolism and thrombosis of left femoral vein: Secondary | ICD-10-CM | POA: Diagnosis not present

## 2017-07-14 DIAGNOSIS — Z86718 Personal history of other venous thrombosis and embolism: Secondary | ICD-10-CM | POA: Diagnosis not present

## 2017-07-15 DIAGNOSIS — M961 Postlaminectomy syndrome, not elsewhere classified: Secondary | ICD-10-CM | POA: Diagnosis not present

## 2017-07-17 DIAGNOSIS — M961 Postlaminectomy syndrome, not elsewhere classified: Secondary | ICD-10-CM | POA: Diagnosis not present

## 2017-07-17 DIAGNOSIS — Z681 Body mass index (BMI) 19 or less, adult: Secondary | ICD-10-CM | POA: Diagnosis not present

## 2017-07-17 DIAGNOSIS — I10 Essential (primary) hypertension: Secondary | ICD-10-CM | POA: Diagnosis not present

## 2017-07-17 DIAGNOSIS — Z9689 Presence of other specified functional implants: Secondary | ICD-10-CM | POA: Diagnosis not present

## 2017-07-21 ENCOUNTER — Telehealth: Payer: Self-pay | Admitting: Family Medicine

## 2017-07-21 NOTE — Telephone Encounter (Signed)
SW pt and advised him that Dr. Anitra Lauth is consulting with Dr. Marin Olp about is u/s results. Pt voiced understanding. Pt did ask if he should go ahead and fill his Rx. He only has 1 day left.   SW Dr. Anitra Lauth and he said to try to fill half Rx (15 pills) if unable to do that then its okay to not fill. We can restart if need be. Pt advised and voiced understanding. Pt stated that he will wait to hear back before refilling Rx since they come prefilled by [facility].

## 2017-07-21 NOTE — Telephone Encounter (Signed)
Pt requesting call back with doppler results that was performed last week.  He is due to have his medication refilled today and needs to know results before he gets med refilled.

## 2017-07-22 DIAGNOSIS — M961 Postlaminectomy syndrome, not elsewhere classified: Secondary | ICD-10-CM | POA: Diagnosis not present

## 2017-07-24 ENCOUNTER — Telehealth: Payer: Self-pay | Admitting: Family Medicine

## 2017-07-24 DIAGNOSIS — M961 Postlaminectomy syndrome, not elsewhere classified: Secondary | ICD-10-CM | POA: Diagnosis not present

## 2017-07-24 NOTE — Telephone Encounter (Signed)
Pt advised and voiced understanding.   

## 2017-07-24 NOTE — Telephone Encounter (Signed)
Pls notify pt that his lower extremity ultrasound was unchanged compared to the one in April this year: some chronic clot but nothing new and nothing is significantly obstructing blood flow. I have put in a message to Dr. Marin Olp but haven't gotten a response.  I will send another message AND I'll try my best to carve out time today to call Dr. Marin Olp as well.  Tell him thanks for waiting, and it is ok to NOT take the xarelto right now.-thx

## 2017-07-28 ENCOUNTER — Other Ambulatory Visit: Payer: Self-pay | Admitting: Family Medicine

## 2017-07-28 ENCOUNTER — Encounter: Payer: Self-pay | Admitting: Family Medicine

## 2017-07-28 MED ORDER — ASPIRIN 81 MG PO TABS: 81.0000 mg | ORAL_TABLET | Freq: Every day | ORAL | 0 refills | Status: DC

## 2017-07-28 NOTE — Telephone Encounter (Signed)
I researched the subject of treatment duration of chronic proximal DVT (only one acute DVT in the past--PROVOKED).  Even with residual chronic venous obstruction on f/u venous doppler u/s, there is no evidence that ongoing anticoagulation results in less DVT recurrence.  Therefore, our treatment of 6 mo with acute DVT dosing of xarelto + 6 mo of lower dose tx with xarelto was an aggressive approach to prevention of DVT recurrence, but his anticoagulant is no longer beneficial at this time. He will stay OFF xarelto and he'll restart his daily 81mg  ASA qd.

## 2017-07-29 DIAGNOSIS — M961 Postlaminectomy syndrome, not elsewhere classified: Secondary | ICD-10-CM | POA: Diagnosis not present

## 2017-07-31 DIAGNOSIS — M961 Postlaminectomy syndrome, not elsewhere classified: Secondary | ICD-10-CM | POA: Diagnosis not present

## 2017-08-05 DIAGNOSIS — M961 Postlaminectomy syndrome, not elsewhere classified: Secondary | ICD-10-CM | POA: Diagnosis not present

## 2017-08-08 DIAGNOSIS — Z9689 Presence of other specified functional implants: Secondary | ICD-10-CM | POA: Diagnosis not present

## 2017-08-08 DIAGNOSIS — M961 Postlaminectomy syndrome, not elsewhere classified: Secondary | ICD-10-CM | POA: Diagnosis not present

## 2017-08-08 DIAGNOSIS — Z6831 Body mass index (BMI) 31.0-31.9, adult: Secondary | ICD-10-CM | POA: Diagnosis not present

## 2017-08-08 DIAGNOSIS — R030 Elevated blood-pressure reading, without diagnosis of hypertension: Secondary | ICD-10-CM | POA: Diagnosis not present

## 2017-08-12 DIAGNOSIS — M961 Postlaminectomy syndrome, not elsewhere classified: Secondary | ICD-10-CM | POA: Diagnosis not present

## 2017-08-14 DIAGNOSIS — M961 Postlaminectomy syndrome, not elsewhere classified: Secondary | ICD-10-CM | POA: Diagnosis not present

## 2017-08-19 DIAGNOSIS — M961 Postlaminectomy syndrome, not elsewhere classified: Secondary | ICD-10-CM | POA: Diagnosis not present

## 2017-08-21 DIAGNOSIS — M961 Postlaminectomy syndrome, not elsewhere classified: Secondary | ICD-10-CM | POA: Diagnosis not present

## 2017-08-26 DIAGNOSIS — M961 Postlaminectomy syndrome, not elsewhere classified: Secondary | ICD-10-CM | POA: Diagnosis not present

## 2017-09-01 ENCOUNTER — Ambulatory Visit (INDEPENDENT_AMBULATORY_CARE_PROVIDER_SITE_OTHER): Payer: PPO

## 2017-09-01 ENCOUNTER — Encounter (INDEPENDENT_AMBULATORY_CARE_PROVIDER_SITE_OTHER): Payer: Self-pay | Admitting: Orthopaedic Surgery

## 2017-09-01 ENCOUNTER — Ambulatory Visit (INDEPENDENT_AMBULATORY_CARE_PROVIDER_SITE_OTHER): Payer: PPO | Admitting: Orthopaedic Surgery

## 2017-09-01 DIAGNOSIS — M25552 Pain in left hip: Secondary | ICD-10-CM | POA: Insufficient documentation

## 2017-09-01 DIAGNOSIS — Z96642 Presence of left artificial hip joint: Secondary | ICD-10-CM | POA: Diagnosis not present

## 2017-09-01 NOTE — Progress Notes (Signed)
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  The original acetabular component was a [employer] ASR component of felt significantly.  In September 2017 we were able to perform a revision done anterior approach.  We placed a size 62 acetabular component with 3 screws.  Postoperatively after several days 1 of his cultures did come back positive but it was felt that this was likely artifact.  He has had no problems in his hip until recently when he feels almost as if the hip is going to come out of place.  Since we have seen him last he is also had a spinal cord stimulator so he cannot have an MRI again.  On examination when I had him lay supine his leg lengths are equal.  I can easily move his right hip around but his left hip has some pain with range of motion.  A standing AP pelvis and lateral of his left hip are concerning for lucency around the acetabular component and micromotion of the component suggesting loosening and infection cannot be ruled out.  I went over the plain film findings with him and personally reviewed all the films from his intraoperative to postoperative films to more recent films.  I would like to obtain a CT scan to better assess the acetabular component positioning.  I will see him back in a week and will have a CT scan.  I will then likely set him up for a laboratory analysis such as a sed rate, CRP and white blood cell count as well as an aspiration of his hip to rule out infection.  He understands the he will likely need some type of revision arthroplasty of the acetabular component.

## 2017-09-02 DIAGNOSIS — M961 Postlaminectomy syndrome, not elsewhere classified: Secondary | ICD-10-CM | POA: Diagnosis not present

## 2017-09-03 ENCOUNTER — Other Ambulatory Visit (INDEPENDENT_AMBULATORY_CARE_PROVIDER_SITE_OTHER): Payer: Self-pay

## 2017-09-03 DIAGNOSIS — S73005D Unspecified dislocation of left hip, subsequent encounter: Secondary | ICD-10-CM

## 2017-09-05 ENCOUNTER — Ambulatory Visit
Admission: RE | Admit: 2017-09-05 | Discharge: 2017-09-05 | Disposition: A | Discharge status: Home / Self Care | Payer: PPO | Source: Ambulatory Visit | Attending: Orthopaedic Surgery | Admitting: Orthopaedic Surgery

## 2017-09-05 DIAGNOSIS — M25552 Pain in left hip: Secondary | ICD-10-CM | POA: Diagnosis not present

## 2017-09-05 DIAGNOSIS — S73005D Unspecified dislocation of left hip, subsequent encounter: Secondary | ICD-10-CM

## 2017-09-07 ENCOUNTER — Encounter: Payer: Self-pay | Admitting: Family Medicine

## 2017-09-08 ENCOUNTER — Ambulatory Visit (INDEPENDENT_AMBULATORY_CARE_PROVIDER_SITE_OTHER): Payer: PPO | Admitting: Orthopaedic Surgery

## 2017-09-08 ENCOUNTER — Encounter (INDEPENDENT_AMBULATORY_CARE_PROVIDER_SITE_OTHER): Payer: Self-pay | Admitting: Orthopaedic Surgery

## 2017-09-08 ENCOUNTER — Other Ambulatory Visit (INDEPENDENT_AMBULATORY_CARE_PROVIDER_SITE_OTHER): Payer: Self-pay

## 2017-09-08 ENCOUNTER — Other Ambulatory Visit (INDEPENDENT_AMBULATORY_CARE_PROVIDER_SITE_OTHER): Payer: Self-pay | Admitting: Radiology

## 2017-09-08 DIAGNOSIS — T84038D Mechanical loosening of other internal prosthetic joint, subsequent encounter: Secondary | ICD-10-CM | POA: Diagnosis not present

## 2017-09-08 DIAGNOSIS — T84038A Mechanical loosening of other internal prosthetic joint, initial encounter: Secondary | ICD-10-CM | POA: Insufficient documentation

## 2017-09-08 DIAGNOSIS — Z96649 Presence of unspecified artificial hip joint: Secondary | ICD-10-CM | POA: Insufficient documentation

## 2017-09-08 DIAGNOSIS — M25552 Pain in left hip: Secondary | ICD-10-CM

## 2017-09-08 DIAGNOSIS — T84018D Broken internal joint prosthesis, other site, subsequent encounter: Secondary | ICD-10-CM

## 2017-09-08 DIAGNOSIS — M25452 Effusion, left hip: Secondary | ICD-10-CM

## 2017-09-08 DIAGNOSIS — T84018A Broken internal joint prosthesis, other site, initial encounter: Secondary | ICD-10-CM | POA: Insufficient documentation

## 2017-09-08 DIAGNOSIS — Z96642 Presence of left artificial hip joint: Secondary | ICD-10-CM

## 2017-09-08 NOTE — Progress Notes (Signed)
Patient is here today for follow-up after I sent her for CT scan to evaluate his left hip prosthesis had a concern of migration of the acetabular component getting close to a year out from an acetabular revision.  He originally had an ASR acetabular component from [employer] that was recalled.  The revised that hip for 1 of my partners with a new acetabular component and it seemed to go well.  Over this last year though he developed some hip pain and x-rays did show evidence of migration of the acetabular component on plain film.  His postoperative course we did have him on 6 weeks of IV antibiotics due to a rare sensitive staph that was seen well after the surgery.  He had been done well for a while.  He developed worsening hip pain.  He embolus with a rolling walker.  He is very tall 71 year old gentleman.  The CT scan is reviewed with him and it does show evidence of migration of the acetabular component with loosening around component suggesting particular disease versus infection.  I shared with him the findings and my recommendation for revision arthroplasty of the acetabular component.  In the interim though I will obtain a CRP, sed rate and CBC.  We will also can set him up for an intra-articular hip aspiration of the left hip to send for cell count,  Gram stain and culture.  I told him about the worse case scenario being finding infection and having to have everything removed versus just revise the acetabular component with bone grafting and a larger multihole component.  We had a long and thorough discussion about this as well as the risk and benefits.  We will need to set him up for the surgery sometime in the next 3-5 weeks.  All questions concerns were answered and addressed.

## 2017-09-08 NOTE — Addendum Note (Signed)
Addended by: Meyer Cory on: 09/08/2017 04:01 PM   Modules accepted: Orders

## 2017-09-09 LAB — CBC WITH DIFFERENTIAL/PLATELET
BASOS PCT: 0.3 %
Basophils Absolute: 20 cells/uL (ref 0–200)
EOS PCT: 3 %
Eosinophils Absolute: 201 cells/uL (ref 15–500)
HCT: 45.8 % (ref 38.5–50.0)
Hemoglobin: 15.4 g/dL (ref 13.2–17.1)
Lymphs Abs: 2265 cells/uL (ref 850–3900)
MCH: 29.2 pg (ref 27.0–33.0)
MCHC: 33.6 g/dL (ref 32.0–36.0)
MCV: 86.7 fL (ref 80.0–100.0)
MONOS PCT: 9.9 %
MPV: 9.9 fL (ref 7.5–12.5)
NEUTROS ABS: 3551 {cells}/uL (ref 1500–7800)
Neutrophils Relative %: 53 %
PLATELETS: 302 10*3/uL (ref 140–400)
RBC: 5.28 10*6/uL (ref 4.20–5.80)
RDW: 13.8 % (ref 11.0–15.0)
TOTAL LYMPHOCYTE: 33.8 %
WBC mixed population: 663 cells/uL (ref 200–950)
WBC: 6.7 10*3/uL (ref 3.8–10.8)

## 2017-09-09 LAB — SEDIMENTATION RATE: SED RATE: 28 mm/h — AB (ref 0–20)

## 2017-09-09 LAB — C-REACTIVE PROTEIN: CRP: 44.2 mg/L — ABNORMAL HIGH (ref <8.0)

## 2017-09-10 ENCOUNTER — Encounter (INDEPENDENT_AMBULATORY_CARE_PROVIDER_SITE_OTHER): Payer: Self-pay | Admitting: Physical Medicine and Rehabilitation

## 2017-09-10 ENCOUNTER — Ambulatory Visit (INDEPENDENT_AMBULATORY_CARE_PROVIDER_SITE_OTHER): Payer: PPO | Admitting: Physical Medicine and Rehabilitation

## 2017-09-10 ENCOUNTER — Ambulatory Visit (INDEPENDENT_AMBULATORY_CARE_PROVIDER_SITE_OTHER): Payer: PPO

## 2017-09-10 VITALS — BP 178/77 | HR 65 | Temp 98.4°F | Ht 75.0 in | Wt 258.0 lb

## 2017-09-10 DIAGNOSIS — M25552 Pain in left hip: Secondary | ICD-10-CM | POA: Diagnosis not present

## 2017-09-10 DIAGNOSIS — T84038D Mechanical loosening of other internal prosthetic joint, subsequent encounter: Secondary | ICD-10-CM

## 2017-09-10 DIAGNOSIS — Z96649 Presence of unspecified artificial hip joint: Secondary | ICD-10-CM

## 2017-09-10 NOTE — Progress Notes (Signed)
[name] - 71 y.o. male MRN [id number]  Date of birth: 17-Nov-1945  Office Visit Note: Visit Date: 09/10/2017 PCP: Joseph Sou, MD Referred by: Joseph Sou, MD  Subjective: Chief Complaint  Patient presents with  . Left Hip - Pain   HPI: [name] is a 71 year old gentleman with left total hip replacement with one revision and now loosening.  He comes in today at the request of [name] for hip aspiration.  They are planning a second revision.    ROS Otherwise per HPI.  Assessment & Plan: Visit Diagnoses:  1. Pain in left hip   2. Loosening of prosthetic hip, subsequent encounter     Plan: Findings:  Successful placement of a 20-gauge Touhy needle in and around the femoral neck component and acetabulum without any aspiration of fluid noted.  Did washout the area with sterile normal saline still really unable to aspirate any fluid.    Meds & Orders: No orders of the defined types were placed in this encounter.   Orders Placed This Encounter  Procedures  . Large Joint Inj: L hip joint  . XR C-ARM NO REPORT    Follow-up: Return for [name].   Procedures: Large Joint Inj: L hip joint on 09/10/2017 1:54 PM Indications: diagnostic evaluation and pain Details: 22 G 3.5 in needle, fluoroscopy-guided anterior approach  Arthrogram: No  Aspirate: 0.5 mL blood-tinged Outcome: tolerated well, no immediate complications  Unable to aspirate more than just a half a milliliter of fluid.  This was after 10 mL normal saline washout. Procedure, treatment alternatives, risks and benefits explained, specific risks discussed. Consent was given by the patient. Immediately prior to procedure a time out was called to verify the correct patient, procedure, equipment, support staff and site/side marked as required. Patient was prepped and draped in the usual sterile fashion.      No notes on file   Clinical History: IMPRESSION: 1. Loosening and migration of  the acetabular component of the left total hip prosthesis, now with a protrusio deformity and cortical defect in the medial wall of the acetabulum. 2. Abnormal soft tissue in the left hip joint. 3. Multiple bone erosions of the acetabulum. 4. 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   Electronically Signed   By: [name] M.D.   On: 09/05/2017 14:11  He reports that  has never smoked. he has never used smokeless tobacco. No results for input(s): HGBA1C, LABURIC in the last 8760 hours.  Objective:  VS:  HT:6\' 3"  (190.5 cm)   WT:258 lb (117 kg)  BMI:32.25    BP:(!) 178/77  HR:65bpm  TEMP:98.4 F (36.9 C)(Oral)  RESP:97 % Physical Exam  Musculoskeletal:  Patient ambulates with a walker.  He is very slow to arise from a seated position.  He has well-healed surgical scars anteriorly and posterior about the hip.  There is no swelling or induration or redness.    Ortho Exam Imaging: Xr C-arm No Report  Result Date: 09/10/2017 Please see Notes or Procedures tab for imaging impression.   Past Medical/Family/Surgical/Social History: Medications & Allergies reviewed per EMR Patient Active Problem List   Diagnosis Date Noted  . Loosening of prosthetic hip ([place]) 09/08/2017  . 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 Pain in left hip 09/01/2017  . History of revision of total replacement of left hip joint 09/01/2017  . Encounter for surveillance of recalled total hip arthroplasty hardware ([place]) 06/07/2016  . Status post revision  of total hip replacement 06/07/2016  . 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 Red blood cell abnormality 02/11/2016  . Elevated blood pressure reading without diagnosis of hypertension 07/01/2013  . Peripheral neuropathy 06/02/2013  . Health maintenance examination 11/26/2011  . ELEVATED PROSTATE SPECIFIC ANTIGEN 05/21/2010  . Obesity, Class I, BMI 30-34.9 05/18/2010  . VENOUS INSUFFICIENCY,  CHRONIC 05/18/2010  . OSTEOARTHRITIS, KNEES, BILATERAL 05/18/2010   Past Medical History:  Diagnosis Date  . Anterior dislocation of right shoulder 09/2013   Reduced in ED under sedation.  Dislocated posteriorly 2015, ? partial RC tear, has f/u planned with Dr. Marlou Sa--? surgery? possible plan for 2016.  [name] Blood donor, whole blood    Has donated approx 30 gallons in his lifetime  . 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  CT hip 08/2017 w/acetabular erosion--findings worrisome for particle disease vs infection--ortho checking labs and possibly doing jt aspiration for G stain/culture---needs hip surgery either way..  . Chronic pain syndrome    Candidate for spinal cord stimulator (03/2017)  . Chronic renal insufficiency, stage II (mild)   . Chronic venous insufficiency    +compression hose  . DDD (degenerative disc disease), lumbar 2017   Dr. Noe Gens ortho.  He is now wearing a new leg brace.  Has had ESI and will get another.  [name] DVT (deep venous thrombosis) ([place]) 06/14/2016   provoked, bilateral LL's; in post-op setting s/p hip surgery.  Xarelto x 78mo at full dosing, then 6 more months at 1/2 dosing, then stopped xarelto.    . Foot drop, left    s/p hip fracture 1969  . 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  As of urol f/u 08/2014, plan is for me to follow PSAs and fax results to Dr. Junious Silk: PSA 02/2015 was 0.21 (stable).  Stable on f/u with Dr. Junious Silk 08/2015 and 11/2016.  [name] Hx of adenomatous colonic polyps    polypectomy 2007; 2012; 2015 (+adenomatous, no high grade dysplasia); recall 5 yrs  . Hypercholesterolemia 02/2017   Recommended statin 02/10/17 but pt declined, wanted to do TLC trial first.  . Osteoarthritis    knees and hips primarily  . Toe  infection 05/27/2013   Left great toe and right 2nd toe  . Trochanteric bursitis of left hip    Steroid injection by ortho 09/10/16  . Urge incontinence    vesicare helpful   Family History  Problem Relation Age of Onset  . Lung cancer Mother   . Heart disease Father    Past Surgical History:  Procedure Laterality Date  . ANTERIOR HIP REVISION Left 06/07/2016   Procedure: LEFT ANTERIOR HIP ACETABULAR REVISION;  Surgeon: Mcarthur Rossetti, MD;  Location: [facility];  Service: Orthopedics;  Laterality: Left;  . arthroscopy left shoulder Left 07  . bilateral carpal tunnel release  2003  . BILATERAL CARPAL TUNNEL RELEASE    . COLONOSCOPY  2007;2012;2015   04/12/2014 Tubular adenoma x 1; per Dr. Henrene Pastor recall 5 yrs  . FRACTURE SURGERY Left   . JOINT REPLACEMENT Left    hip  . LUMBAR LAMINECTOMY/DECOMPRESSION MICRODISCECTOMY Left 03/28/2016   Procedure: Left Lumbar three-four Laminoforaminotomy;  Surgeon: Erline Levine, MD;  Location: [facility];  Service: Neurosurgery;  Laterality: Left;  left  . RADIOACTIVE  SEED IMPLANT  2012  . ROTATOR CUFF REPAIR  2006   right  . seed implant prostate  2012  . SHOULDER ARTHROSCOPY Left   . SPINAL CORD STIMULATOR IMPLANT  06/2017   Very helpful  . TOTAL HIP ARTHROPLASTY  2008   left (cobalt chrome)  . VASECTOMY     Social History   Occupational History  . Not on file  Tobacco Use  . Smoking status: Never Smoker  . Smokeless tobacco: Never Used  Substance and Sexual Activity  . Alcohol use: No  . Drug use: No  . Sexual activity: Not on file

## 2017-09-11 ENCOUNTER — Encounter (INDEPENDENT_AMBULATORY_CARE_PROVIDER_SITE_OTHER): Payer: Self-pay | Admitting: Physical Medicine and Rehabilitation

## 2017-10-02 ENCOUNTER — Other Ambulatory Visit (INDEPENDENT_AMBULATORY_CARE_PROVIDER_SITE_OTHER): Payer: Self-pay | Admitting: Orthopaedic Surgery

## 2017-10-02 DIAGNOSIS — Z96649 Presence of unspecified artificial hip joint: Principal | ICD-10-CM

## 2017-10-02 DIAGNOSIS — T84018D Broken internal joint prosthesis, other site, subsequent encounter: Secondary | ICD-10-CM

## 2017-10-03 ENCOUNTER — Other Ambulatory Visit (HOSPITAL_COMMUNITY): Payer: Self-pay | Admitting: Emergency Medicine

## 2017-10-03 NOTE — Patient Instructions (Addendum)
HASSEL UPHOFF  10/03/2017   Your procedure is scheduled on: 10-09-17  Report to [place]  Entrance    Report to admitting at 7:30AM   Call this number if you have problems the morning of surgery [phone]   Remember: ONLY 1 PERSON MAY GO WITH YOU TO SHORT STAY TO GET  READY MORNING OF YOUR SURGERY.  Do not eat food or drink liquids :After Midnight.     Take these medicines the morning of surgery with A SIP OF WATER: tylenol, gabapentin, vesicare                                 You may not have any metal on your body including hair pins and              piercings  Do not wear jewelry, make-up, lotions, powders or perfumes, deodorant                        Men may shave face and neck.   Do not bring valuables to the hospital. [place].  Contacts, dentures or bridgework may not be worn into surgery.  Leave suitcase in the car. After surgery it may be brought to your room.                 Please read over the following fact sheets you were given: _____________________________________________________________________            [facility] - Preparing for Surgery Before surgery, you can play an important role.  Because skin is not sterile, your skin needs to be as free of germs as possible.  You can reduce the number of germs on your skin by washing with CHG (chlorahexidine gluconate) soap before surgery.  CHG is an antiseptic cleaner which kills germs and bonds with the skin to continue killing germs even after washing. Please DO NOT use if you have an allergy to CHG or antibacterial soaps.  If your skin becomes reddened/irritated stop using the CHG and inform your nurse when you arrive at Short Stay. Do not shave (including legs and underarms) for at least 48 hours prior to the first CHG shower.  You may shave your face/neck. Please follow these instructions carefully:  1.  Shower with CHG Soap  the night before surgery and the  morning of Surgery.  2.  If you choose to wash your hair, wash your hair first as usual with your  normal  shampoo.  3.  After you shampoo, rinse your hair and body thoroughly to remove the  shampoo.                           4.  Use CHG as you would any other liquid soap.  You can apply chg directly  to the skin and wash                       Gently with a scrungie or clean washcloth.  5.  Apply the CHG Soap to your body ONLY FROM THE NECK DOWN.   Do not use on face/ open  Wound or open sores. Avoid contact with eyes, ears mouth and genitals (private parts).                       Wash face,  Genitals (private parts) with your normal soap.             6.  Wash thoroughly, paying special attention to the area where your surgery  will be performed.  7.  Thoroughly rinse your body with warm water from the neck down.  8.  DO NOT shower/wash with your normal soap after using and rinsing off  the CHG Soap.                9.  Pat yourself dry with a clean towel.            10.  Wear clean pajamas.            11.  Place clean sheets on your bed the night of your first shower and do not  sleep with pets. Day of Surgery : Do not apply any lotions/deodorants the morning of surgery.  Please wear clean clothes to the hospital/surgery center.   FAILURE TO FOLLOW THESE INSTRUCTIONS MAY RESULT IN THE CANCELLATION OF YOUR SURGERY PATIENT SIGNATURE_________________________________  NURSE SIGNATURE__________________________________  ________________________________________________________________________

## 2017-10-06 ENCOUNTER — Encounter (HOSPITAL_COMMUNITY)
Admission: RE | Admit: 2017-10-06 | Discharge: 2017-10-06 | Disposition: A | Discharge status: Home / Self Care | Payer: PPO | Source: Ambulatory Visit | Attending: Orthopaedic Surgery | Admitting: Orthopaedic Surgery

## 2017-10-06 ENCOUNTER — Other Ambulatory Visit: Payer: Self-pay

## 2017-10-06 ENCOUNTER — Other Ambulatory Visit (INDEPENDENT_AMBULATORY_CARE_PROVIDER_SITE_OTHER): Payer: Self-pay

## 2017-10-06 ENCOUNTER — Encounter (HOSPITAL_COMMUNITY): Payer: Self-pay

## 2017-10-06 DIAGNOSIS — G629 Polyneuropathy, unspecified: Secondary | ICD-10-CM | POA: Diagnosis not present

## 2017-10-06 DIAGNOSIS — Y793 Surgical instruments, materials and orthopedic devices (including sutures) associated with adverse incidents: Secondary | ICD-10-CM | POA: Diagnosis present

## 2017-10-06 DIAGNOSIS — T8452XA Infection and inflammatory reaction due to internal left hip prosthesis, initial encounter: Secondary | ICD-10-CM | POA: Diagnosis not present

## 2017-10-06 DIAGNOSIS — Z8249 Family history of ischemic heart disease and other diseases of the circulatory system: Secondary | ICD-10-CM | POA: Diagnosis not present

## 2017-10-06 DIAGNOSIS — Z923 Personal history of irradiation: Secondary | ICD-10-CM | POA: Diagnosis not present

## 2017-10-06 DIAGNOSIS — Z471 Aftercare following joint replacement surgery: Secondary | ICD-10-CM | POA: Diagnosis not present

## 2017-10-06 DIAGNOSIS — T84091A Other mechanical complication of internal left hip prosthesis, initial encounter: Secondary | ICD-10-CM | POA: Diagnosis not present

## 2017-10-06 DIAGNOSIS — M48062 Spinal stenosis, lumbar region with neurogenic claudication: Secondary | ICD-10-CM | POA: Diagnosis not present

## 2017-10-06 DIAGNOSIS — Z8546 Personal history of malignant neoplasm of prostate: Secondary | ICD-10-CM | POA: Diagnosis not present

## 2017-10-06 DIAGNOSIS — E7800 Pure hypercholesterolemia, unspecified: Secondary | ICD-10-CM | POA: Diagnosis not present

## 2017-10-06 DIAGNOSIS — Z96642 Presence of left artificial hip joint: Secondary | ICD-10-CM | POA: Diagnosis not present

## 2017-10-06 DIAGNOSIS — T84018D Broken internal joint prosthesis, other site, subsequent encounter: Secondary | ICD-10-CM | POA: Diagnosis not present

## 2017-10-06 DIAGNOSIS — Z91048 Other nonmedicinal substance allergy status: Secondary | ICD-10-CM | POA: Diagnosis not present

## 2017-10-06 DIAGNOSIS — N182 Chronic kidney disease, stage 2 (mild): Secondary | ICD-10-CM | POA: Diagnosis not present

## 2017-10-06 DIAGNOSIS — Z7982 Long term (current) use of aspirin: Secondary | ICD-10-CM | POA: Diagnosis not present

## 2017-10-06 DIAGNOSIS — Z86010 Personal history of colonic polyps: Secondary | ICD-10-CM | POA: Diagnosis not present

## 2017-10-06 DIAGNOSIS — Z801 Family history of malignant neoplasm of trachea, bronchus and lung: Secondary | ICD-10-CM | POA: Diagnosis not present

## 2017-10-06 DIAGNOSIS — Z96649 Presence of unspecified artificial hip joint: Secondary | ICD-10-CM | POA: Diagnosis not present

## 2017-10-06 DIAGNOSIS — M170 Bilateral primary osteoarthritis of knee: Secondary | ICD-10-CM | POA: Diagnosis not present

## 2017-10-06 DIAGNOSIS — G894 Chronic pain syndrome: Secondary | ICD-10-CM | POA: Diagnosis not present

## 2017-10-06 DIAGNOSIS — Z86718 Personal history of other venous thrombosis and embolism: Secondary | ICD-10-CM | POA: Diagnosis not present

## 2017-10-06 LAB — SURGICAL PCR SCREEN
MRSA, PCR: NEGATIVE
Staphylococcus aureus: NEGATIVE

## 2017-10-06 LAB — CBC
HCT: 47.8 % (ref 39.0–52.0)
Hemoglobin: 15.6 g/dL (ref 13.0–17.0)
MCH: 29.9 pg (ref 26.0–34.0)
MCHC: 32.6 g/dL (ref 30.0–36.0)
MCV: 91.6 fL (ref 78.0–100.0)
PLATELETS: 268 10*3/uL (ref 150–400)
RBC: 5.22 MIL/uL (ref 4.22–5.81)
RDW: 15.3 % (ref 11.5–15.5)
WBC: 7.2 10*3/uL (ref 4.0–10.5)

## 2017-10-06 LAB — BASIC METABOLIC PANEL
Anion gap: 6 (ref 5–15)
BUN: 19 mg/dL (ref 6–20)
CALCIUM: 10 mg/dL (ref 8.9–10.3)
CO2: 25 mmol/L (ref 22–32)
Chloride: 107 mmol/L (ref 101–111)
Creatinine, Ser: 1.07 mg/dL (ref 0.61–1.24)
GFR calc Af Amer: >60 mL/min (ref >60)
GLUCOSE: 110 mg/dL — AB (ref 65–99)
Potassium: 4.6 mmol/L (ref 3.5–5.1)
Sodium: 138 mmol/L (ref 135–145)

## 2017-10-07 DIAGNOSIS — Z96649 Presence of unspecified artificial hip joint: Secondary | ICD-10-CM

## 2017-10-07 DIAGNOSIS — T8459XA Infection and inflammatory reaction due to other internal joint prosthesis, initial encounter: Secondary | ICD-10-CM

## 2017-10-07 HISTORY — DX: Infection and inflammatory reaction due to other internal joint prosthesis, initial encounter: Z96.649

## 2017-10-07 HISTORY — DX: Presence of unspecified artificial hip joint: T84.59XA

## 2017-10-09 ENCOUNTER — Inpatient Hospital Stay (HOSPITAL_COMMUNITY): Payer: PPO

## 2017-10-09 ENCOUNTER — Inpatient Hospital Stay (HOSPITAL_COMMUNITY): Payer: PPO | Admitting: Certified Registered Nurse Anesthetist

## 2017-10-09 ENCOUNTER — Other Ambulatory Visit: Payer: Self-pay

## 2017-10-09 ENCOUNTER — Encounter (HOSPITAL_COMMUNITY)
Admission: RE | Disposition: A | Discharge status: Home Health | Payer: Self-pay | Source: Ambulatory Visit | Attending: Orthopaedic Surgery

## 2017-10-09 ENCOUNTER — Encounter (HOSPITAL_COMMUNITY): Payer: Self-pay | Admitting: *Deleted

## 2017-10-09 ENCOUNTER — Inpatient Hospital Stay (HOSPITAL_COMMUNITY)
Admission: RE | Admit: 2017-10-09 | Discharge: 2017-10-13 | DRG: 468 | Disposition: A | Discharge status: Home Health | Payer: PPO | Source: Ambulatory Visit | Attending: Orthopaedic Surgery | Admitting: Orthopaedic Surgery

## 2017-10-09 DIAGNOSIS — E7800 Pure hypercholesterolemia, unspecified: Secondary | ICD-10-CM | POA: Diagnosis present

## 2017-10-09 DIAGNOSIS — T8452XA Infection and inflammatory reaction due to internal left hip prosthesis, initial encounter: Principal | ICD-10-CM | POA: Diagnosis present

## 2017-10-09 DIAGNOSIS — Z801 Family history of malignant neoplasm of trachea, bronchus and lung: Secondary | ICD-10-CM | POA: Diagnosis not present

## 2017-10-09 DIAGNOSIS — Z7982 Long term (current) use of aspirin: Secondary | ICD-10-CM | POA: Diagnosis not present

## 2017-10-09 DIAGNOSIS — Z86718 Personal history of other venous thrombosis and embolism: Secondary | ICD-10-CM

## 2017-10-09 DIAGNOSIS — Z923 Personal history of irradiation: Secondary | ICD-10-CM

## 2017-10-09 DIAGNOSIS — Z91048 Other nonmedicinal substance allergy status: Secondary | ICD-10-CM | POA: Diagnosis not present

## 2017-10-09 DIAGNOSIS — Z8249 Family history of ischemic heart disease and other diseases of the circulatory system: Secondary | ICD-10-CM | POA: Diagnosis not present

## 2017-10-09 DIAGNOSIS — G894 Chronic pain syndrome: Secondary | ICD-10-CM | POA: Diagnosis present

## 2017-10-09 DIAGNOSIS — Z8546 Personal history of malignant neoplasm of prostate: Secondary | ICD-10-CM

## 2017-10-09 DIAGNOSIS — T84091A Other mechanical complication of internal left hip prosthesis, initial encounter: Secondary | ICD-10-CM | POA: Diagnosis present

## 2017-10-09 DIAGNOSIS — Y793 Surgical instruments, materials and orthopedic devices (including sutures) associated with adverse incidents: Secondary | ICD-10-CM | POA: Diagnosis present

## 2017-10-09 DIAGNOSIS — T84018D Broken internal joint prosthesis, other site, subsequent encounter: Secondary | ICD-10-CM

## 2017-10-09 DIAGNOSIS — Z86010 Personal history of colonic polyps: Secondary | ICD-10-CM

## 2017-10-09 DIAGNOSIS — Z419 Encounter for procedure for purposes other than remedying health state, unspecified: Secondary | ICD-10-CM

## 2017-10-09 DIAGNOSIS — Z96649 Presence of unspecified artificial hip joint: Secondary | ICD-10-CM

## 2017-10-09 DIAGNOSIS — N182 Chronic kidney disease, stage 2 (mild): Secondary | ICD-10-CM | POA: Diagnosis present

## 2017-10-09 DIAGNOSIS — Z96642 Presence of left artificial hip joint: Secondary | ICD-10-CM | POA: Diagnosis present

## 2017-10-09 DIAGNOSIS — T84018A Broken internal joint prosthesis, other site, initial encounter: Secondary | ICD-10-CM

## 2017-10-09 HISTORY — PX: ANTERIOR HIP REVISION: SHX6527

## 2017-10-09 LAB — POCT I-STAT 4, (NA,K, GLUC, HGB,HCT)
Glucose, Bld: 125 mg/dL — ABNORMAL HIGH (ref 65–99)
HCT: 37 % — ABNORMAL LOW (ref 39.0–52.0)
Hemoglobin: 12.6 g/dL — ABNORMAL LOW (ref 13.0–17.0)
Potassium: 4.7 mmol/L (ref 3.5–5.1)
SODIUM: 139 mmol/L (ref 135–145)

## 2017-10-09 LAB — TYPE AND SCREEN
ABO/RH(D): A POS
ANTIBODY SCREEN: NEGATIVE

## 2017-10-09 SURGERY — REVISION, TOTAL ARTHROPLASTY, HIP, ANTERIOR APPROACH
Anesthesia: General | Site: Hip | Laterality: Left

## 2017-10-09 MED ORDER — GABAPENTIN 100 MG PO CAPS
200.0000 mg | ORAL_CAPSULE | Freq: Two times a day (BID) | ORAL | Status: DC
  Administered 2017-10-09 – 2017-10-13 (×8): 200 mg via ORAL
  Filled 2017-10-09 (×8): qty 2

## 2017-10-09 MED ORDER — ONDANSETRON HCL 4 MG/2ML IJ SOLN: 4.0000 mg | Freq: Four times a day (QID) | INTRAMUSCULAR | Status: DC | PRN

## 2017-10-09 MED ORDER — DEXAMETHASONE SODIUM PHOSPHATE 10 MG/ML IJ SOLN
INTRAMUSCULAR | Status: DC | PRN
  Administered 2017-10-09: 10 mg via INTRAVENOUS

## 2017-10-09 MED ORDER — TRANEXAMIC ACID 1000 MG/10ML IV SOLN
1000.0000 mg | INTRAVENOUS | Status: AC
  Administered 2017-10-09: 1000 mg via INTRAVENOUS
  Filled 2017-10-09: qty 1100

## 2017-10-09 MED ORDER — MENTHOL 3 MG MT LOZG: 1.0000 | LOZENGE | OROMUCOSAL | Status: DC | PRN

## 2017-10-09 MED ORDER — FENTANYL CITRATE (PF) 250 MCG/5ML IJ SOLN
INTRAMUSCULAR | Status: AC
  Filled 2017-10-09: qty 5

## 2017-10-09 MED ORDER — ACETAMINOPHEN 650 MG RE SUPP: 650.0000 mg | RECTAL | Status: DC | PRN

## 2017-10-09 MED ORDER — DEXTROSE 5 % IV SOLN
500.0000 mg | Freq: Four times a day (QID) | INTRAVENOUS | Status: DC | PRN
  Administered 2017-10-09: 500 mg via INTRAVENOUS
  Filled 2017-10-09: qty 5

## 2017-10-09 MED ORDER — DOCUSATE SODIUM 100 MG PO CAPS
100.0000 mg | ORAL_CAPSULE | Freq: Two times a day (BID) | ORAL | Status: DC
  Administered 2017-10-09 – 2017-10-13 (×8): 100 mg via ORAL
  Filled 2017-10-09 (×8): qty 1

## 2017-10-09 MED ORDER — ALBUMIN HUMAN 5 % IV SOLN
INTRAVENOUS | Status: AC
  Filled 2017-10-09: qty 250

## 2017-10-09 MED ORDER — ROCURONIUM BROMIDE 10 MG/ML (PF) SYRINGE
PREFILLED_SYRINGE | INTRAVENOUS | Status: DC | PRN
  Administered 2017-10-09: 40 mg via INTRAVENOUS
  Administered 2017-10-09: 3 mg via INTRAVENOUS

## 2017-10-09 MED ORDER — PROMETHAZINE HCL 25 MG/ML IJ SOLN: 6.2500 mg | INTRAMUSCULAR | Status: DC | PRN

## 2017-10-09 MED ORDER — STERILE WATER FOR IRRIGATION IR SOLN
Status: DC | PRN
  Administered 2017-10-09: 2000 mL

## 2017-10-09 MED ORDER — HYDROMORPHONE HCL 1 MG/ML IJ SOLN
INTRAMUSCULAR | Status: AC
  Administered 2017-10-09: 0.5 mg via INTRAVENOUS
  Filled 2017-10-09: qty 2

## 2017-10-09 MED ORDER — PHENOL 1.4 % MT LIQD: 1.0000 | OROMUCOSAL | Status: DC | PRN

## 2017-10-09 MED ORDER — RIVAROXABAN 10 MG PO TABS
10.0000 mg | ORAL_TABLET | Freq: Every day | ORAL | Status: DC
  Administered 2017-10-10 – 2017-10-13 (×4): 10 mg via ORAL
  Filled 2017-10-09 (×4): qty 1

## 2017-10-09 MED ORDER — HYDROMORPHONE HCL 1 MG/ML IJ SOLN
0.2500 mg | INTRAMUSCULAR | Status: DC | PRN
  Administered 2017-10-09 (×2): 0.5 mg via INTRAVENOUS

## 2017-10-09 MED ORDER — ALBUMIN HUMAN 5 % IV SOLN
INTRAVENOUS | Status: DC | PRN
  Administered 2017-10-09 (×3): via INTRAVENOUS

## 2017-10-09 MED ORDER — ASPIRIN EC 325 MG PO TBEC: 325.0000 mg | DELAYED_RELEASE_TABLET | Freq: Two times a day (BID) | ORAL | Status: DC

## 2017-10-09 MED ORDER — MIDAZOLAM HCL 5 MG/5ML IJ SOLN
INTRAMUSCULAR | Status: DC | PRN
  Administered 2017-10-09: 2 mg via INTRAVENOUS

## 2017-10-09 MED ORDER — CEFAZOLIN SODIUM-DEXTROSE 2-4 GM/100ML-% IV SOLN
2.0000 g | INTRAVENOUS | Status: AC
  Administered 2017-10-09: 2 g via INTRAVENOUS

## 2017-10-09 MED ORDER — FENTANYL CITRATE (PF) 250 MCG/5ML IJ SOLN
INTRAMUSCULAR | Status: DC | PRN
  Administered 2017-10-09 (×3): 50 ug via INTRAVENOUS
  Administered 2017-10-09: 100 ug via INTRAVENOUS

## 2017-10-09 MED ORDER — ACETAMINOPHEN 325 MG PO TABS: 650.0000 mg | ORAL_TABLET | ORAL | Status: DC | PRN

## 2017-10-09 MED ORDER — DEXAMETHASONE SODIUM PHOSPHATE 10 MG/ML IJ SOLN
INTRAMUSCULAR | Status: AC
  Filled 2017-10-09: qty 1

## 2017-10-09 MED ORDER — HYDROCODONE-ACETAMINOPHEN 5-325 MG PO TABS: 1.0000 | ORAL_TABLET | ORAL | Status: DC | PRN

## 2017-10-09 MED ORDER — LACTATED RINGERS IV SOLN
INTRAVENOUS | Status: DC
  Administered 2017-10-09 (×4): via INTRAVENOUS

## 2017-10-09 MED ORDER — HYDROMORPHONE HCL 1 MG/ML IJ SOLN
1.0000 mg | INTRAMUSCULAR | Status: DC | PRN
  Administered 2017-10-09: 18:00:00 1 mg via INTRAVENOUS
  Filled 2017-10-09: qty 1

## 2017-10-09 MED ORDER — EPHEDRINE SULFATE-NACL 50-0.9 MG/10ML-% IV SOSY
PREFILLED_SYRINGE | INTRAVENOUS | Status: DC | PRN
  Administered 2017-10-09: 15 mg via INTRAVENOUS
  Administered 2017-10-09: 10 mg via INTRAVENOUS

## 2017-10-09 MED ORDER — SODIUM CHLORIDE 0.9 % IR SOLN
Status: DC | PRN
  Administered 2017-10-09: 4000 mL

## 2017-10-09 MED ORDER — CEFAZOLIN SODIUM-DEXTROSE 2-4 GM/100ML-% IV SOLN
INTRAVENOUS | Status: AC
  Filled 2017-10-09: qty 100

## 2017-10-09 MED ORDER — SUGAMMADEX SODIUM 200 MG/2ML IV SOLN
INTRAVENOUS | Status: DC | PRN
  Administered 2017-10-09: 150 mg via INTRAVENOUS

## 2017-10-09 MED ORDER — SUCCINYLCHOLINE CHLORIDE 200 MG/10ML IV SOSY
PREFILLED_SYRINGE | INTRAVENOUS | Status: DC | PRN
  Administered 2017-10-09: 140 mg via INTRAVENOUS

## 2017-10-09 MED ORDER — METOCLOPRAMIDE HCL 5 MG PO TABS: 5.0000 mg | ORAL_TABLET | Freq: Three times a day (TID) | ORAL | Status: DC | PRN

## 2017-10-09 MED ORDER — OXYCODONE HCL 5 MG PO TABS
10.0000 mg | ORAL_TABLET | ORAL | Status: DC | PRN
  Administered 2017-10-09 – 2017-10-10 (×3): 5 mg via ORAL
  Administered 2017-10-10 (×3): 10 mg via ORAL
  Administered 2017-10-10 – 2017-10-11 (×2): 5 mg via ORAL
  Administered 2017-10-11 – 2017-10-12 (×3): 10 mg via ORAL
  Filled 2017-10-09 (×11): qty 2

## 2017-10-09 MED ORDER — ONDANSETRON HCL 4 MG PO TABS: 4.0000 mg | ORAL_TABLET | Freq: Four times a day (QID) | ORAL | Status: DC | PRN

## 2017-10-09 MED ORDER — ALUM & MAG HYDROXIDE-SIMETH 200-200-20 MG/5ML PO SUSP
30.0000 mL | ORAL | Status: DC | PRN
Start: 2017-10-09 — End: 2017-10-13

## 2017-10-09 MED ORDER — METOCLOPRAMIDE HCL 5 MG/ML IJ SOLN: 5.0000 mg | Freq: Three times a day (TID) | INTRAMUSCULAR | Status: DC | PRN

## 2017-10-09 MED ORDER — 0.9 % SODIUM CHLORIDE (POUR BTL) OPTIME
TOPICAL | Status: DC | PRN
  Administered 2017-10-09: 1000 mL

## 2017-10-09 MED ORDER — HYDROMORPHONE HCL 1 MG/ML IJ SOLN
INTRAMUSCULAR | Status: DC | PRN
  Administered 2017-10-09: 0.5 mg via INTRAVENOUS

## 2017-10-09 MED ORDER — KETAMINE HCL 10 MG/ML IJ SOLN
INTRAMUSCULAR | Status: AC
  Filled 2017-10-09: qty 1

## 2017-10-09 MED ORDER — ZOLPIDEM TARTRATE 5 MG PO TABS: 5.0000 mg | ORAL_TABLET | Freq: Every evening | ORAL | Status: DC | PRN

## 2017-10-09 MED ORDER — MIDAZOLAM HCL 2 MG/2ML IJ SOLN
INTRAMUSCULAR | Status: AC
  Filled 2017-10-09: qty 2

## 2017-10-09 MED ORDER — HYDROMORPHONE HCL 2 MG/ML IJ SOLN
INTRAMUSCULAR | Status: AC
  Filled 2017-10-09: qty 1

## 2017-10-09 MED ORDER — PROPOFOL 10 MG/ML IV BOLUS
INTRAVENOUS | Status: DC | PRN
  Administered 2017-10-09: 200 mg via INTRAVENOUS

## 2017-10-09 MED ORDER — CEFAZOLIN SODIUM-DEXTROSE 2-4 GM/100ML-% IV SOLN
2.0000 g | Freq: Four times a day (QID) | INTRAVENOUS | Status: AC
  Administered 2017-10-09 (×2): 2 g via INTRAVENOUS
  Filled 2017-10-09 (×2): qty 100

## 2017-10-09 MED ORDER — SODIUM CHLORIDE 0.9 % IV SOLN
INTRAVENOUS | Status: DC
  Administered 2017-10-09: 15:00:00 via INTRAVENOUS

## 2017-10-09 MED ORDER — METHOCARBAMOL 500 MG PO TABS
500.0000 mg | ORAL_TABLET | Freq: Four times a day (QID) | ORAL | Status: DC | PRN
  Administered 2017-10-10: 500 mg via ORAL
  Filled 2017-10-09 (×3): qty 1

## 2017-10-09 MED ORDER — DARIFENACIN HYDROBROMIDE ER 7.5 MG PO TB24
7.5000 mg | ORAL_TABLET | Freq: Every day | ORAL | Status: DC
  Administered 2017-10-10 – 2017-10-13 (×4): 7.5 mg via ORAL
  Filled 2017-10-09 (×5): qty 1

## 2017-10-09 MED ORDER — ONDANSETRON HCL 4 MG/2ML IJ SOLN
INTRAMUSCULAR | Status: DC | PRN
  Administered 2017-10-09: 4 mg via INTRAVENOUS

## 2017-10-09 MED ORDER — POLYETHYLENE GLYCOL 3350 17 G PO PACK
17.0000 g | PACK | Freq: Every day | ORAL | Status: DC | PRN
Start: 2017-10-09 — End: 2017-10-13

## 2017-10-09 MED ORDER — KETAMINE HCL 10 MG/ML IJ SOLN
INTRAMUSCULAR | Status: DC | PRN
  Administered 2017-10-09 (×2): 30 mg via INTRAVENOUS

## 2017-10-09 MED ORDER — PROPOFOL 10 MG/ML IV BOLUS
INTRAVENOUS | Status: AC
  Filled 2017-10-09: qty 20

## 2017-10-09 MED ORDER — DIPHENHYDRAMINE HCL 12.5 MG/5ML PO ELIX: 12.5000 mg | ORAL_SOLUTION | ORAL | Status: DC | PRN

## 2017-10-09 SURGICAL SUPPLY — 54 items
APL SKNCLS STERI-STRIP NONHPOA (GAUZE/BANDAGES/DRESSINGS)
BAG SPEC THK2 15X12 ZIP CLS (MISCELLANEOUS)
BAG ZIPLOCK 12X15 (MISCELLANEOUS) IMPLANT
BENZOIN TINCTURE PRP APPL 2/3 (GAUZE/BANDAGES/DRESSINGS) ×1 IMPLANT
BLADE SAW SGTL 18X1.27X75 (BLADE) ×1 IMPLANT
BLADE SAW SGTL 18X1.27X75MM (BLADE)
BONE CHIP PRESERV 30CC PCAN30 (Bone Implant) ×3 IMPLANT
CATH COUDE 24FR 5CC (CATHETERS) IMPLANT
CATH RIBBED COUDE  30CC (CATHETERS) ×2
CATH RIBBED COUDE 30CC (CATHETERS) ×1
CELLS DAT CNTRL 66122 CELL SVR (MISCELLANEOUS) IMPLANT
CLOSURE WOUND 1/2 X4 (GAUZE/BANDAGES/DRESSINGS) ×1
CLOTH BEACON ORANGE TIMEOUT ST (SAFETY) ×3 IMPLANT
COVER PERINEAL POST (MISCELLANEOUS) ×3 IMPLANT
COVER SURGICAL LIGHT HANDLE (MISCELLANEOUS) ×3 IMPLANT
DRAPE STERI IOBAN 125X83 (DRAPES) ×6 IMPLANT
DRAPE U-SHAPE 47X51 STRL (DRAPES) ×6 IMPLANT
DRSG AQUACEL AG ADV 3.5X10 (GAUZE/BANDAGES/DRESSINGS) ×3 IMPLANT
DURAPREP 26ML APPLICATOR (WOUND CARE) ×3 IMPLANT
ELECT REM PT RETURN 15FT ADLT (MISCELLANEOUS) ×3 IMPLANT
GAUZE XEROFORM 1X8 LF (GAUZE/BANDAGES/DRESSINGS) ×3 IMPLANT
GLOVE BIO SURGEON STRL SZ7.5 (GLOVE) ×6 IMPLANT
GLOVE BIOGEL PI IND STRL 7.5 (GLOVE) IMPLANT
GLOVE BIOGEL PI INDICATOR 7.5 (GLOVE) ×14
GLOVE ECLIPSE 8.0 STRL XLNG CF (GLOVE) ×3 IMPLANT
GLOVE INDICATOR 8.0 STRL GRN (GLOVE) ×6 IMPLANT
GOWN STRL REUS W/TWL LRG LVL3 (GOWN DISPOSABLE) ×14 IMPLANT
GRAFT BNE CANC CHIPS 1-8 30CC (Bone Implant) IMPLANT
GRPTN ATABULAR SHELL REVSN 68 (Orthopedic Implant) ×3 IMPLANT
HANDPIECE INTERPULSE COAX TIP (DISPOSABLE) ×3
HEAD SROM 36MM PLUS 12 IMPLANT
HOLDER FOLEY CATH W/STRAP (MISCELLANEOUS) ×3 IMPLANT
LINER NEUTRAL 62MMC36MM P4 (Liner) ×2 IMPLANT
PACK ANTERIOR HIP CUSTOM (KITS) ×3 IMPLANT
RETRACTOR WND ALEXIS 18 MED (MISCELLANEOUS) ×1 IMPLANT
RTRCTR WOUND ALEXIS 18CM MED (MISCELLANEOUS)
SCREW 6.5MMX25MM (Screw) ×2 IMPLANT
SCREW 6.5MMX30MM (Screw) ×8 IMPLANT
SCREW BN 35X5XPERI TPR HD (Screw) IMPLANT
SCREW BONE 5.0X35 (Screw) ×3 IMPLANT
SCREW PINN CAN 6.5X20 (Screw) ×2 IMPLANT
SCREW PINN CAN BONE 6.5MMX15MM (Screw) ×3 IMPLANT
SCREW TPR HD 5.0MM DIA 50 (Screw) ×8 IMPLANT
SET HNDPC FAN SPRY TIP SCT (DISPOSABLE) ×1 IMPLANT
SHELL ACTBLR GRIPTION REVSN 68 (Orthopedic Implant) IMPLANT
SPONGE LAP 18X18 X RAY DECT (DISPOSABLE) ×2 IMPLANT
SROM HEAD 36MM PLUS 12 ×3 IMPLANT
STRIP CLOSURE SKIN 1/2X4 (GAUZE/BANDAGES/DRESSINGS) ×2 IMPLANT
SUCTION YANKAUER HANDLE (MISCELLANEOUS) ×2 IMPLANT
SUT MNCRL AB 4-0 PS2 18 (SUTURE) ×1 IMPLANT
SUT VIC AB 1 CT1 36 (SUTURE) ×9 IMPLANT
SUT VIC AB 2-0 CT1 27 (SUTURE) ×6
SUT VIC AB 2-0 CT1 TAPERPNT 27 (SUTURE) ×2 IMPLANT
TRAY FOLEY W/METER SILVER 16FR (SET/KITS/TRAYS/PACK) ×3 IMPLANT

## 2017-10-09 NOTE — H&P (Signed)
[name] is an 72 y.o. male.   Chief Complaint: left hip pain; known failed acetabular component of a left total hip revision arthroplasty HPI: The patient is a 72 year old gentleman who has a history of a total hip arthroplasty that was performed several years ago.  The acetabular component was a DePuy ASR that failed.  This was the recalled acetabular component.  A partner of mine who did his original surgery asked if I could revise the acetabular component which we felt we were able to do successfully in September 2017.  Although his intraoperative Gram stain was negative at the time of the revision and did eventually grow out sensitive staph.  He was still  treated aggressively with a PICC line and IV antibiotics.  We have followed him over the last year he has done well.  He then started to develop some hip pain toward the end of this past year.  X-rays were concerning for migration of the acetabular component and this was confirmed with a CT scan.  At this point the concern is what is the quality of his pelvic bone and could there be residual infection.  We attempted an aspiration under direct fluoroscopy which was negative for any fluid collection.  His white blood cell count peripherally is normal but he does have an elevated CRP and sed rate.  At this point we recommended revision of it minimal the acetabular component with bone grafting and a revision cup with the idea that everything may need to be removed depending on our intraoperative findings.  This is been shared to him we had a long and thorough discussion about the risk and benefits of this surgery.  Past Medical History:  Diagnosis Date  . Anterior dislocation of right shoulder 09/2013   Reduced in ED under sedation.  Dislocated posteriorly 2015, ? partial RC tear, has f/u planned with Dr. Marlou Sa--? surgery? possible plan for 2016.  [name] Blood donor, whole blood    Has donated approx 30 gallons in his lifetime  . Chronic left hip pain     s/p left hip revision arthroplasty for acetabular failure; MRI 10/2016 showed diffuse muscle atrophy around L hip--gabapentin started and pt referred to PT.  CT hip 08/2017 w/acetabular erosion--findings worrisome for particle disease vs infection--ortho checking labs and possibly doing jt aspiration for G stain/culture---needs hip surgery either way..  . Chronic pain syndrome    Candidate for spinal cord stimulator (03/2017)  . Chronic renal insufficiency, stage II (mild)   . Chronic venous insufficiency    +compression hose  . DDD (degenerative disc disease), lumbar 2017   Dr. Noe Gens ortho.  He is now wearing a new leg brace.  Has had ESI and will get another.  [name] DVT (deep venous thrombosis) ([place]) 06/14/2016   provoked, bilateral LL's; in post-op setting s/p hip surgery.  Xarelto x 59mo at full dosing, then 6 more months at 1/2 dosing, then stopped xarelto.    . Foot drop, left    s/p hip fracture 1969  . 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  As of urol f/u 08/2014, plan is for me to follow PSAs and fax results to Dr. Junious Silk: PSA 02/2015 was 0.21 (stable).  Stable on f/u with Dr. Junious Silk 08/2015 and 11/2016.  [name] Hx of adenomatous colonic polyps    polypectomy 2007; 2012; 2015 (+adenomatous, no high grade dysplasia); recall 5 yrs  . Hypercholesterolemia  02/2017   Recommended statin 02/10/17 but pt declined, wanted to do TLC trial first.  . Osteoarthritis    knees and hips primarily  . Toe infection 05/27/2013   Left great toe and right 2nd toe  . Trochanteric bursitis of left hip    Steroid injection by ortho 09/10/16  . Urge incontinence    vesicare helpful    Past Surgical History:  Procedure Laterality Date  . ANTERIOR HIP REVISION Left 06/07/2016   Procedure: LEFT ANTERIOR HIP ACETABULAR REVISION;  Surgeon: Mcarthur Rossetti, MD;  Location: [facility];  Service: Orthopedics;  Laterality:  Left;  . arthroscopy left shoulder Left 07  . bilateral carpal tunnel release  2003  . BILATERAL CARPAL TUNNEL RELEASE    . COLONOSCOPY  2007;2012;2015   04/12/2014 Tubular adenoma x 1; per Dr. Henrene Pastor recall 5 yrs  . FRACTURE SURGERY Left   . JOINT REPLACEMENT Left    hip  . LUMBAR LAMINECTOMY/DECOMPRESSION MICRODISCECTOMY Left 03/28/2016   Procedure: Left Lumbar three-four Laminoforaminotomy;  Surgeon: Erline Levine, MD;  Location: [place] [facility];  Service: Neurosurgery;  Laterality: Left;  left  . RADIOACTIVE SEED IMPLANT  2012  . ROTATOR CUFF REPAIR  2006   right  . seed implant prostate  2012  . SHOULDER ARTHROSCOPY Left   . SPINAL CORD STIMULATOR IMPLANT  06/2017   Very helpful  . TOTAL HIP ARTHROPLASTY  2008   left (cobalt chrome)  . VASECTOMY      Family History  Problem Relation Age of Onset  . Lung cancer Mother   . Heart disease Father    Social History:  reports that  has never smoked. he has never used smokeless tobacco. He reports that he does not drink alcohol or use drugs.  Allergies:  Allergies  Allergen Reactions  . Adhesive [Tape] Rash    Paper tape is ok    Medications Prior to Admission  Medication Sig Dispense Refill  . acetaminophen (TYLENOL) 500 MG tablet Take 1,000 mg by mouth 2 (two) times daily as needed for moderate pain or headache.    [name] aspirin 81 MG tablet Take 1 tablet (81 mg total) by mouth daily. 30 tablet 0  . gabapentin (NEURONTIN) 100 MG capsule TAKE 1 CAPSULE (100 MG TOTAL) BY MOUTH 3 (THREE) TIMES DAILY. (Patient taking differently: Take 200 mg by mouth 2 (two) times daily. ) 60 capsule 1  . solifenacin (VESICARE) 5 MG tablet Take 5 mg by mouth daily.      No results found for this or any previous visit (from the past 48 hour(s)). No results found.  Review of Systems  Musculoskeletal: Positive for back pain and joint pain.  All other systems reviewed and are negative.   Blood pressure (!) 142/86, pulse 61, temperature 98.3 F (36.8  C), temperature source Oral, resp. rate 16, height 6\' 3"  (1.905 m), weight 264 lb (119.7 kg), SpO2 99 %. Physical Exam  Constitutional: He is oriented to person, place, and time. He appears well-developed and well-nourished.  HENT:  Head: Normocephalic and atraumatic.  Eyes: EOM are normal. Pupils are equal, round, and reactive to light.  Neck: Normal range of motion. Neck supple.  Cardiovascular: Normal rate and regular rhythm.  Respiratory: Effort normal and breath sounds normal.  GI: Soft. Bowel sounds are normal.  Musculoskeletal:       Left hip: He exhibits decreased range of motion, decreased strength, tenderness, bony tenderness and deformity.  Neurological: He is alert and oriented to person,  place, and time.  Skin: Skin is warm and dry.  Psychiatric: He has a normal mood and affect.     Assessment/Plan Failed left hip acetabular component  We plan to proceed to surgery today for revision of the acetabular component and potential bone grafting versus an excision arthroplasty pending our intraoperative findings.  The risk and benefits of this have been explained in significant detail and informed consent is obtained.  Mcarthur Rossetti, MD 10/09/2017, 9:21 AM

## 2017-10-09 NOTE — Anesthesia Postprocedure Evaluation (Signed)
Anesthesia Post Note  Patient: [name]  Procedure(s) Performed: Left hip acetabular revision (Left Hip)     Patient location during evaluation: PACU Anesthesia Type: General Level of consciousness: awake, awake and alert and oriented Pain management: pain level controlled Vital Signs Assessment: post-procedure vital signs reviewed and stable Respiratory status: spontaneous breathing, nonlabored ventilation and respiratory function stable Cardiovascular status: blood pressure returned to baseline Anesthetic complications: no    Last Vitals:  Vitals:   10/09/17 1626 10/09/17 1715  BP: 130/72 121/64  Pulse: 62 64  Resp: 16 16  Temp: 36.6 C 36.6 C  SpO2: 97% 99%    Last Pain:  Vitals:   10/09/17 1715  TempSrc: Oral  PainSc:                  Desa Rech COKER

## 2017-10-09 NOTE — Anesthesia Procedure Notes (Signed)
Procedure Name: Intubation Date/Time: 10/09/2017 9:51 AM Performed by: [place] Brando Taves C, CRNA Pre-anesthesia Checklist: Patient identified, Emergency Drugs available, Suction available and Patient being monitored Patient Re-evaluated:Patient Re-evaluated prior to induction Oxygen Delivery Method: Circle system utilized Preoxygenation: Pre-oxygenation with 100% oxygen Induction Type: IV induction Ventilation: Mask ventilation without difficulty Laryngoscope Size: Mac and 4 Grade View: Grade I Tube type: Oral Tube size: 7.5 mm Number of attempts: 1 Airway Equipment and Method: Stylet and Oral airway Placement Confirmation: ETT inserted through vocal cords under direct vision,  positive ETCO2 and breath sounds checked- equal and bilateral Secured at: 22 cm Tube secured with: Tape Dental Injury: Teeth and Oropharynx as per pre-operative assessment

## 2017-10-09 NOTE — Transfer of Care (Signed)
Immediate Anesthesia Transfer of Care Note  Patient: [name]  Procedure(s) Performed: Left hip acetabular revision (Left Hip)  Patient Location: PACU  Anesthesia Type:General  Level of Consciousness: drowsy, patient cooperative and responds to stimulation  Airway & Oxygen Therapy: Patient Spontanous Breathing and Patient connected to face mask oxygen  Post-op Assessment: Report given to RN and Post -op Vital signs reviewed and stable  Post vital signs: Reviewed and stable  Last Vitals:  Vitals:   10/09/17 0749 10/09/17 1319  BP: (!) 142/86 110/66  Pulse: 61 69  Resp: 16 10  Temp: 36.8 C 36.6 C  SpO2: 99% 99%    Last Pain:  Vitals:   10/09/17 0749  TempSrc: Oral  PainSc: 2          Complications: No apparent anesthesia complications

## 2017-10-09 NOTE — Plan of Care (Signed)
Reviewed plan of care with pt, understanding was verbalized and all questions answered.

## 2017-10-09 NOTE — Brief Op Note (Signed)
10/09/2017  12:56 PM  PATIENT:  [name]  72 y.o. male  PRE-OPERATIVE DIAGNOSIS:  failed acetabular component left hip  POST-OPERATIVE DIAGNOSIS:  failed acetabular component left hip  PROCEDURE:  Procedure(s): Left hip acetabular revision (Left)  SURGEON:  Surgeon(s) and Role:    Mcarthur Rossetti, MD - Primary  PHYSICIAN ASSISTANT:   Benita Stabile, PA-C  ANESTHESIA:   general  EBL:  1500 mL   COUNTS:  YES  DICTATION: .Other Dictation: Dictation Number [id number]  PLAN OF CARE: Admit to inpatient   PATIENT DISPOSITION:  PACU - hemodynamically stable.   Delay start of Pharmacological VTE agent (>24hrs) due to surgical blood loss or risk of bleeding: no

## 2017-10-09 NOTE — Discharge Instructions (Addendum)
Information on my medicine - XARELTO® (Rivaroxaban) ° °This medication education was reviewed with me or my healthcare representative as part of my discharge preparation.  The pharmacist that spoke with me during my hospital stay was:  °Why was Xarelto® prescribed for you? °Xarelto® was prescribed for you to reduce the risk of blood clots forming after orthopedic surgery. The medical term for these abnormal blood clots is venous thromboembolism (VTE). ° °What do you need to know about xarelto® ? °Take your Xarelto® ONCE DAILY at the same time every day. °You may take it either with or without food. ° °If you have difficulty swallowing the tablet whole, you may crush it and mix in applesauce just prior to taking your dose. ° °Take Xarelto® exactly as prescribed by your doctor and DO NOT stop taking Xarelto® without talking to the doctor who prescribed the medication.  Stopping without other VTE prevention medication to take the place of Xarelto® may increase your risk of developing a clot. ° °After discharge, you should have regular check-up appointments with your healthcare provider that is prescribing your Xarelto®.   ° °What do you do if you miss a dose? °If you miss a dose, take it as soon as you remember on the same day then continue your regularly scheduled once daily regimen the next day. Do not take two doses of Xarelto® on the same day.  ° °Important Safety Information °A possible side effect of Xarelto® is bleeding. You should call your healthcare provider right away if you experience any of the following: °? Bleeding from an injury or your nose that does not stop. °? Unusual colored urine (red or dark brown) or unusual colored stools (red or black). °? Unusual bruising for unknown reasons. °? A serious fall or if you hit your head (even if there is no bleeding). ° °Some medicines may interact with Xarelto® and might increase your risk of bleeding while on Xarelto®. To help avoid this, consult your  healthcare provider or pharmacist prior to using any new prescription or non-prescription medications, including herbals, vitamins, non-steroidal anti-inflammatory drugs (NSAIDs) and supplements. ° °This website has more information on Xarelto®: www.xarelto.com. ° °INSTRUCTIONS AFTER JOINT REPLACEMENT  ° °o Remove items at home which could result in a fall. This includes throw rugs or furniture in walking pathways °o ICE to the affected joint every three hours while awake for 30 minutes at a time, for at least the first 3-5 days, and then as needed for pain and swelling.  Continue to use ice for pain and swelling. You may notice swelling that will progress down to the foot and ankle.  This is normal after surgery.  Elevate your leg when you are not up walking on it.   °o Continue to use the breathing machine you got in the hospital (incentive spirometer) which will help keep your temperature down.  It is common for your temperature to cycle up and down following surgery, especially at night when you are not up moving around and exerting yourself.  The breathing machine keeps your lungs expanded and your temperature down. ° ° °DIET:  As you were doing prior to hospitalization, we recommend a well-balanced diet. ° °DRESSING / WOUND CARE / SHOWERING ° °Keep the surgical dressing until follow up.  The dressing is water proof, so you can shower without any extra covering.  IF THE DRESSING FALLS OFF or the wound gets wet inside, change the dressing with sterile gauze.  Please use good hand washing   techniques before changing the dressing.  Do not use any lotions or creams on the incision until instructed by your surgeon.   ° °ACTIVITY ° °o Increase activity slowly as tolerated, but follow the weight bearing instructions below.   °o No driving for 6 weeks or until further direction given by your physician.  You cannot drive while taking narcotics.  °o No lifting or carrying greater than 10 lbs. until further directed by  your surgeon. °o Avoid periods of inactivity such as sitting longer than an hour when not asleep. This helps prevent blood clots.  °o You may return to work once you are authorized by your doctor.  ° ° ° °WEIGHT BEARING  ° °Weight bearing as tolerated with assist device (walker, cane, etc) as directed, use it as long as suggested by your surgeon or therapist, typically at least 4-6 weeks. ° ° °EXERCISES ° °Results after joint replacement surgery are often greatly improved when you follow the exercise, range of motion and muscle strengthening exercises prescribed by your doctor. Safety measures are also important to protect the joint from further injury. Any time any of these exercises cause you to have increased pain or swelling, decrease what you are doing until you are comfortable again and then slowly increase them. If you have problems or questions, call your caregiver or physical therapist for advice.  ° °Rehabilitation is important following a joint replacement. After just a few days of immobilization, the muscles of the leg can become weakened and shrink (atrophy).  These exercises are designed to build up the tone and strength of the thigh and leg muscles and to improve motion. Often times heat used for twenty to thirty minutes before working out will loosen up your tissues and help with improving the range of motion but do not use heat for the first two weeks following surgery (sometimes heat can increase post-operative swelling).  ° °These exercises can be done on a training (exercise) mat, on the floor, on a table or on a bed. Use whatever works the best and is most comfortable for you.    Use music or television while you are exercising so that the exercises are a pleasant break in your day. This will make your life better with the exercises acting as a break in your routine that you can look forward to.   Perform all exercises about fifteen times, three times per day or as directed.  You should exercise  both the operative leg and the other leg as well. ° °Exercises include: °  °• Quad Sets - Tighten up the muscle on the front of the thigh (Quad) and hold for 5-10 seconds.   °• Straight Leg Raises - With your knee straight (if you were given a brace, keep it on), lift the leg to 60 degrees, hold for 3 seconds, and slowly lower the leg.  Perform this exercise against resistance later as your leg gets stronger.  °• Leg Slides: Lying on your back, slowly slide your foot toward your buttocks, bending your knee up off the floor (only go as far as is comfortable). Then slowly slide your foot back down until your leg is flat on the floor again.  °• Angel Wings: Lying on your back spread your legs to the side as far apart as you can without causing discomfort.  °• Hamstring Strength:  Lying on your back, push your heel against the floor with your leg straight by tightening up the muscles of your buttocks.    Repeat, but this time bend your knee to a comfortable angle, and push your heel against the floor.  You may put a pillow under the heel to make it more comfortable if necessary.  ° °A rehabilitation program following joint replacement surgery can speed recovery and prevent re-injury in the future due to weakened muscles. Contact your doctor or a physical therapist for more information on knee rehabilitation.  ° ° °CONSTIPATION ° °Constipation is defined medically as fewer than three stools per week and severe constipation as less than one stool per week.  Even if you have a regular bowel pattern at home, your normal regimen is likely to be disrupted due to multiple reasons following surgery.  Combination of anesthesia, postoperative narcotics, change in appetite and fluid intake all can affect your bowels.  ° °YOU MUST use at least one of the following options; they are listed in order of increasing strength to get the job done.  They are all available over the counter, and you may need to use some, POSSIBLY even all of  these options:   ° °Drink plenty of fluids (prune juice may be helpful) and high fiber foods °Colace 100 mg by mouth twice a day  °Senokot for constipation as directed and as needed Dulcolax (bisacodyl), take with full glass of water  °Miralax (polyethylene glycol) once or twice a day as needed. ° °If you have tried all these things and are unable to have a bowel movement in the first 3-4 days after surgery call either your surgeon or your primary doctor.   ° °If you experience loose stools or diarrhea, hold the medications until you stool forms back up.  If your symptoms do not get better within 1 week or if they get worse, check with your doctor.  If you experience "the worst abdominal pain ever" or develop nausea or vomiting, please contact the office immediately for further recommendations for treatment. ° ° °ITCHING:  If you experience itching with your medications, try taking only a single pain pill, or even half a pain pill at a time.  You can also use Benadryl over the counter for itching or also to help with sleep.  ° °TED HOSE STOCKINGS:  Use stockings on both legs until for at least 2 weeks or as directed by physician office. They may be removed at night for sleeping. ° °MEDICATIONS:  See your medication summary on the “After Visit Summary” that nursing will review with you.  You may have some home medications which will be placed on hold until you complete the course of blood thinner medication.  It is important for you to complete the blood thinner medication as prescribed. ° °PRECAUTIONS:  If you experience chest pain or shortness of breath - call 911 immediately for transfer to the hospital emergency department.  ° °If you develop a fever greater that 101 F, purulent drainage from wound, increased redness or drainage from wound, foul odor from the wound/dressing, or calf pain - CONTACT YOUR SURGEON.   °                                                °FOLLOW-UP APPOINTMENTS:  If you do not already have  a post-op appointment, please call the office for an appointment to be seen by your surgeon.  Guidelines for how soon to be seen are   listed in your “After Visit Summary”, but are typically between 1-4 weeks after surgery. ° °OTHER INSTRUCTIONS:  ° °Knee Replacement:  Do not place pillow under knee, focus on keeping the knee straight while resting. CPM instructions: 0-90 degrees, 2 hours in the morning, 2 hours in the afternoon, and 2 hours in the evening. Place foam block, curve side up under heel at all times except when in CPM or when walking.  DO NOT modify, tear, cut, or change the foam block in any way. ° °MAKE SURE YOU:  °• Understand these instructions.  °• Get help right away if you are not doing well or get worse.  ° ° °Thank you for letting us be a part of your medical care team.  It is a privilege we respect greatly.  We hope these instructions will help you stay on track for a fast and full recovery!  ° °

## 2017-10-09 NOTE — Anesthesia Preprocedure Evaluation (Addendum)
Anesthesia Evaluation  Patient identified by MRN, date of birth, ID band Patient awake    Reviewed: Allergy & Precautions, NPO status , Patient's Chart, lab work & pertinent test results  Airway Mallampati: II  TM Distance: >3 FB Neck ROM: Full    Dental no notable dental hx.    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  Xarelto x 79mo at full dosing, then 6 more months at 1/2 dosing, then stopped xarelto.      Neuro/Psych negative neurological ROS  negative psych ROS   GI/Hepatic negative GI ROS, Neg liver ROS,   Endo/Other  negative endocrine ROS  Renal/GU Renal InsufficiencyRenal disease  negative genitourinary   Musculoskeletal negative musculoskeletal ROS (+)   Abdominal   Peds negative pediatric ROS (+)  Hematology negative hematology ROS (+)   Anesthesia Other Findings   Reproductive/Obstetrics negative OB ROS                            Anesthesia Physical Anesthesia Plan  ASA: III  Anesthesia Plan: General   Post-op Pain Management:    Induction: Intravenous  PONV Risk Score and Plan: 2 and Ondansetron, Dexamethasone and Treatment may vary due to age or medical condition  Airway Management Planned: Oral ETT  Additional Equipment:   Intra-op Plan:   Post-operative Plan: Extubation in OR  Informed Consent: I have reviewed the patients History and Physical, chart, labs and discussed the procedure including the risks, benefits and alternatives for the proposed anesthesia with the patient or authorized representative who has indicated his/her understanding and acceptance.   Dental advisory given  Plan Discussed with: CRNA and  Surgeon  Anesthesia Plan Comments:         Anesthesia Quick Evaluation

## 2017-10-10 ENCOUNTER — Encounter (HOSPITAL_COMMUNITY): Payer: Self-pay | Admitting: Orthopaedic Surgery

## 2017-10-10 LAB — CBC
HCT: 36.4 % — ABNORMAL LOW (ref 39.0–52.0)
Hemoglobin: 12.4 g/dL — ABNORMAL LOW (ref 13.0–17.0)
MCH: 30.9 pg (ref 26.0–34.0)
MCHC: 34.1 g/dL (ref 30.0–36.0)
MCV: 90.8 fL (ref 78.0–100.0)
PLATELETS: 277 10*3/uL (ref 150–400)
RBC: 4.01 MIL/uL — AB (ref 4.22–5.81)
RDW: 15.1 % (ref 11.5–15.5)
WBC: 9.5 10*3/uL (ref 4.0–10.5)

## 2017-10-10 LAB — BASIC METABOLIC PANEL
Anion gap: 4 — ABNORMAL LOW (ref 5–15)
BUN: 17 mg/dL (ref 6–20)
CALCIUM: 9 mg/dL (ref 8.9–10.3)
CO2: 25 mmol/L (ref 22–32)
Chloride: 107 mmol/L (ref 101–111)
Creatinine, Ser: 1.12 mg/dL (ref 0.61–1.24)
GFR calc Af Amer: >60 mL/min (ref >60)
Glucose, Bld: 133 mg/dL — ABNORMAL HIGH (ref 65–99)
Potassium: 4.7 mmol/L (ref 3.5–5.1)
SODIUM: 136 mmol/L (ref 135–145)

## 2017-10-10 MED ORDER — VANCOMYCIN HCL IN DEXTROSE 1-5 GM/200ML-% IV SOLN
1000.0000 mg | Freq: Two times a day (BID) | INTRAVENOUS | Status: DC
  Administered 2017-10-10 – 2017-10-11 (×2): 1000 mg via INTRAVENOUS
  Filled 2017-10-10 (×3): qty 200

## 2017-10-10 MED ORDER — VANCOMYCIN HCL 10 G IV SOLR
2000.0000 mg | Freq: Once | INTRAVENOUS | Status: AC
  Administered 2017-10-10: 13:00:00 2000 mg via INTRAVENOUS
  Filled 2017-10-10: qty 2000

## 2017-10-10 NOTE — Evaluation (Signed)
Occupational Therapy Evaluation Patient Details Name: [name] MRN: [id number] DOB: 04/10/1946 Today's Date: 10/10/2017    History of Present Illness 72 yo male s/p revision of acetabular component of previous L THA 10/09/17. Hx of chronic L foot dro.    Clinical Impression   Pt is s/p THA resulting in the deficits listed below (see OT Problem List).  Pt will benefit from skilled OT to increase their safety and independence with ADL and functional mobility for ADL to facilitate discharge to venue listed below.       Follow Up Recommendations  No OT follow up    Equipment Recommendations  None recommended by OT       Precautions / Restrictions Precautions Precautions: Anterior Hip Restrictions Weight Bearing Restrictions: Yes LLE Weight Bearing: Partial weight bearing LLE Partial Weight Bearing Percentage or Pounds: 50%      Mobility Bed Mobility Overal bed mobility: Needs Assistance Bed Mobility: Supine to Sit     Supine to sit: Min assist;HOB elevated     General bed mobility comments: pt in chair  Transfers Overall transfer level: Needs assistance Equipment used: Rolling walker (2 wheeled) Transfers: Sit to/from [facility] Sit to Stand: From elevated surface;Min guard         General transfer comment: close guard for safety. VCs safety, hand placement.     Balance Overall balance assessment: Needs assistance           Standing balance-Leahy Scale: Poor                             ADL either performed or assessed with clinical judgement   ADL Overall ADL's : Needs assistance/impaired Eating/Feeding: Set up;Sitting   Grooming: Set up;Sitting   Upper Body Bathing: Set up;Sitting   Lower Body Bathing: Moderate assistance;Sit to/from stand;Cueing for safety;Cueing for sequencing   Upper Body Dressing : Set up;Sitting   Lower Body Dressing: Moderate assistance;Sit to/from stand;Cueing for sequencing;Cueing for  safety                 General ADL Comments: discussed options for tub and PWB.  Pt may need a tub bench     Vision Patient Visual Report: No change from baseline              Pertinent Vitals/Pain Pain Assessment: 0-10 Pain Score: 3  Pain Location: L hip Pain Descriptors / Indicators: Discomfort;Sore Pain Intervention(s): Monitored during session;Repositioned     Hand Dominance     Extremity/Trunk Assessment Upper Extremity Assessment Upper Extremity Assessment: Defer to OT evaluation   Lower Extremity Assessment Lower Extremity Assessment: LLE deficits/detail LLE Deficits / Details: chronic L foot drop.    Cervical / Trunk Assessment Cervical / Trunk Assessment: Normal   Communication Communication Communication: No difficulties   Cognition Arousal/Alertness: Awake/alert Behavior During Therapy: WFL for tasks assessed/performed Overall Cognitive Status: Within Functional Limits for tasks assessed                                                Home Living Family/patient expects to be discharged to:: Private residence Living Arrangements: Spouse/significant other Available Help at Discharge: Family Type of Home: House Home Access: Ramped entrance     Home Layout: Multi-level Alternate Level Stairs-Number of Steps: 6 up/down bedroom Alternate Level Stairs-Rails:  Left Bathroom Shower/Tub: Tub/shower unit   Bathroom Toilet: Handicapped height     Home Equipment: Environmental consultant - 2 wheels;Cane - single point                   OT Problem List: Decreased strength      OT Treatment/Interventions: Self-care/ADL training;Patient/family education;DME and/or AE instruction    OT Goals(Current goals can be found in the care plan section) Acute Rehab OT Goals Patient Stated Goal: home OT Goal Formulation: With patient Time For Goal Achievement: 10/24/17 ADL Goals Pt Will Perform Lower Body Bathing: with supervision;sit to/from  stand Pt Will Perform Lower Body Dressing: with supervision;sit to/from stand Pt Will Transfer to Toilet: with supervision;ambulating Pt Will Perform Toileting - Clothing Manipulation and hygiene: with supervision;sit to/from stand  OT Frequency: Min 2X/week    AM-PAC PT "6 Clicks" Daily Activity     Outcome Measure Help from another person eating meals?: None Help from another person taking care of personal grooming?: A Little Help from another person toileting, which includes using toliet, bedpan, or urinal?: A Little Help from another person bathing (including washing, rinsing, drying)?: A Little Help from another person to put on and taking off regular upper body clothing?: None Help from another person to put on and taking off regular lower body clothing?: A Little 6 Click Score: 20   End of Session Equipment Utilized During Treatment: Rolling walker Nurse Communication: Mobility status  Activity Tolerance: Patient tolerated treatment well Patient left: in chair;with call bell/phone within reach;with family/visitor present  OT Visit Diagnosis: Unsteadiness on feet (R26.81)                Time: 1030-1043 OT Time Calculation (min): 13 min Charges:  OT General Charges $OT Visit: 1 Visit OT Evaluation $OT Eval Low Complexity: 1 Low G-Codes:     Kari Baars, [place]    Payton Mccallum D 10/10/2017, 1:35 PM

## 2017-10-10 NOTE — Op Note (Signed)
NAME:  [name], [name]                   ACCOUNT NO.:  MEDICAL RECORD NO.:  [id number]  LOCATION:                                 FACILITY:  PHYSICIAN:  Lind Guest. Ninfa Linden, M.D.DATE OF BIRTH:  DATE OF PROCEDURE:  10/09/2017 DATE OF DISCHARGE:                              OPERATIVE REPORT   PREOPERATIVE DIAGNOSIS:  Failed left hip acetabular component.  POSTOPERATIVE DIAGNOSIS:  Failed left hip acetabular component.  PROCEDURE:  Revision of the acetabular component of left total hip arthroplasty.  IMPLANTS:  Size 68 multi-hole Pinnacle revision Deep Profile acetabular component from DePuy with a size 36 +4 liner, a size 36 +12 metal hip ball.  SURGEON:  Lind Guest. Ninfa Linden, M.D.  ASSISTANT:  Erskine Emery, PA-C.  ANESTHESIA:  General.  ANTIBIOTICS:  3 grams of IV Ancef.  BLOOD LOSS:  1500 mL.  PATHOLOGY:  Intraoperative Gram stain negative for organisms, but high white blood cell count.  COMPLICATIONS:  None.  INDICATIONS:  Mr. Repsher is a 72 year old gentleman who, in September 2017, underwent a revision of a failed DePuy ASR acetabular component. It was placed by another orthopedic surgeon in town, that was done through a posterior approach.  We were able to go into the hip anteriorly and take out that failed ASR component and placed a 3-hole DePuy cup going up several sizes, and we were very confident at the time of surgery.  Three days postoperatively, he did grow out a sensitive Staph, and we treated him aggressively with 6 weeks of IV antibiotics. He had done well for a long period of time and then this past year, started developing some hip pain.  I got new x-ray and it showed migration of the acetabular component.  I obtained a CT scan as well and it was worrisome for migration of the cup superiorly and medially.  With that kind of migration, there is always a concern about residual infection.  Given that, I recommended that he undergo a  revision arthroplasty without difficulty.  We would have to have everything come out if there was any infection.  I am concerned about his bone quality as well.  We had a long and thorough discussion about the risks and benefits of the surgery, and he does understand the reasoning behind proceeding with surgery.  PROCEDURE DESCRIPTION:  After informed consent was obtained, appropriate left hip was marked.  He was brought to the operating room, where general anesthesia was obtained while he was on a stretcher.  A Foley catheter was placed and then traction boots were placed on both his feet.  Next, he was placed supine on the Hana fracture table with perineal post in place and both legs in inline skeletal traction devices, but no traction applied.  His left operative hip was prepped and draped with DuraPrep and sterile drapes.  Time-out was called, and he was identified as correct patient and correct left hip.  We then made an incision through our previous incision, took a direct anterior approach to the hip, and dissected down the hip capsule.  I opened this up and finding a very large joint effusion.  Of note, we did try  to aspirate his hip under direct fluoroscopy and preoperatively in our office, and was negative.  There was definitely a large fluid collection, and we sent this off for immediate Gram stain and culture. While we were waiting for this, we were able to dislocate the hip and removed the previous hip ball.  We then removed all 3 screws from the acetabular component and the component without difficulty showing no bone ingrowth at all.  We then cleaned the remnants of the acetabular soft tissue that was remaining and touched a couple of times with different reamers to try to get some type of bleeding bony surface with the worry that we would penetrate the inner table, which we did not. Once we were able to do this, we went up to a size 66 reamer.  I was concerned about  pushing up to a higher reamer because of the thinness of the bone.  At that point, our Gram stain came back as negative.  So, we mixed cancellous bone chips and laid them into the acetabulum, and then we placed the real Deep Profile Pinnacle revision acetabular component, which is a multi-hole component size 68.  We then placed multiple screws deep in the socket and peripheral.  We felt like we had a good fit from there.  We then placed the real 36 +4 liner and went back with a metal +12 hip ball, reduced this in the acetabulum, and we were appreciative of the stability.  This was all done under direct visualization and fluoroscopy.  We then irrigated the soft tissue with normal saline solution using pulsatile lavage.  We put about 6 L of fluid through the hip in general.  We then closed remnants at the joint capsule with interrupted #1 Ethibond suture, followed by running #1 Vicryl in tensor fascia, 0 Vicryl in the deep tissue, 2-0 Vicryl in the subcutaneous tissue, and interrupted staples on the skin.  Xeroform and Aquacel dressing were applied.  He was taken off the Hana table, awakened, extubated, and taken to the recovery room in a stable condition.  Of note, with 1500 mL of blood loss, we did obtain an intraoperative hemoglobin which was 12.  It was stable throughout the case.  Of note, Erskine Emery, PA-C, assisted in the entire case.  His assistance was crucial for facilitating all aspects of this case.     Lind Guest. Ninfa Linden, M.D.     CYB/MEDQ  D:  10/09/2017  T:  10/09/2017  Job:  [id number]

## 2017-10-10 NOTE — Evaluation (Signed)
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 Hx of chronic L foot dro.   Clinical Impression  On eval, pt required Min assist for mobility. He walked ~50 feet with a RW. Pain rated 3/10 with activity. Reviewed anterior precautions (issued handout) and PWB status. Discussed d/c plan-pt would like to d/c home, once medically ready, if MD is okay with that. Will follow and progress activity as tolerated.     Follow Up Recommendations Home health PT    Equipment Recommendations       Recommendations for Other Services       Precautions / Restrictions Precautions Precautions: Anterior Hip Restrictions Weight Bearing Restrictions: Yes LLE Weight Bearing: Partial weight bearing LLE Partial Weight Bearing Percentage or Pounds: 50%      Mobility  Bed Mobility Overal bed mobility: Needs Assistance Bed Mobility: Supine to Sit     Supine to sit: Min assist;HOB elevated     General bed mobility comments: Small amount of assist for legs to ensure adherence to anterior precautions. Cued pt to try to keep knees together and avoid sliding L LE out to the side.   Transfers Overall transfer level: Needs assistance Equipment used: Rolling walker (2 wheeled) Transfers: Sit to/from Stand Sit to Stand: From elevated surface;Min guard         General transfer comment: close guard for safety. VCs safety, hand placement.   Ambulation/Gait Ambulation/Gait assistance: Min guard Ambulation Distance (Feet): 50 Feet Assistive device: Rolling walker (2 wheeled) Gait Pattern/deviations: Step-to pattern     General Gait Details: VCs safety, sequence, technique. Slow gait speed. Cues for adherence to PWB status.   Stairs            Wheelchair Mobility    Modified Rankin (Stroke Patients Only)        Balance Overall balance assessment: Needs assistance           Standing balance-Leahy Scale: Poor                               Pertinent Vitals/Pain Pain Assessment: 0-10 Pain Score: 3  Pain Location: L hip Pain Descriptors / Indicators: Discomfort;Sore Pain Intervention(s): Monitored during session;Repositioned    Home Living Family/patient expects to be discharged to:: Private residence Living Arrangements: Spouse/significant other Available Help at Discharge: Family Type of Home: House Home Access: Ramped entrance     Home Layout: Multi-level Home Equipment: Environmental consultant - 2 wheels;Cane - single point      Prior Function                 Hand Dominance        Extremity/Trunk Assessment   Upper Extremity Assessment Upper Extremity Assessment: Defer to OT evaluation    Lower Extremity Assessment Lower Extremity Assessment: LLE deficits/detail LLE Deficits / Details: chronic L foot drop.     Cervical / Trunk Assessment Cervical / Trunk Assessment: Normal  Communication   Communication: No difficulties  Cognition Arousal/Alertness: Awake/alert Behavior During Therapy: WFL for tasks assessed/performed Overall Cognitive Status: Within Functional Limits for tasks assessed  General Comments      Exercises     Assessment/Plan    PT Assessment Patient needs continued PT services  PT Problem List Decreased strength;Decreased balance;Decreased range of motion;Decreased mobility;Decreased knowledge of use of DME;Decreased knowledge of precautions;Decreased activity tolerance       PT Treatment Interventions DME instruction;Functional mobility training;Balance training;Patient/family education;Therapeutic activities;Gait training;Therapeutic exercise    PT Goals (Current goals can be found in the Care Plan section)  Acute Rehab PT Goals Patient Stated Goal: home PT Goal Formulation:  With patient/family Time For Goal Achievement: 10/24/17 Potential to Achieve Goals: Good    Frequency 7X/week   Barriers to discharge        Co-evaluation               AM-PAC PT "6 Clicks" Daily Activity  Outcome Measure Difficulty turning over in bed (including adjusting bedclothes, sheets and blankets)?: A Little Difficulty moving from lying on back to sitting on the side of the bed? : A Little Difficulty sitting down on and standing up from a chair with arms (e.g., wheelchair, bedside commode, etc,.)?: Unable Help needed moving to and from a bed to chair (including a wheelchair)?: A Little Help needed walking in hospital room?: A Little Help needed climbing 3-5 steps with a railing? : A Little 6 Click Score: 16    End of Session Equipment Utilized During Treatment: Gait belt Activity Tolerance: Patient tolerated treatment well Patient left: in chair;with call bell/phone within reach;with family/visitor present   PT Visit Diagnosis: Muscle weakness (generalized) (M62.81);Difficulty in walking, not elsewhere classified (R26.2);Pain Pain - Right/Left: Left Pain - part of body: Leg    Time: 7903-8333 PT Time Calculation (min) (ACUTE ONLY): 22 min   Charges:   PT Evaluation $PT Eval Moderate Complexity: 1 Mod     PT G Codes:         Weston Anna, MPT Pager: [phone]

## 2017-10-10 NOTE — Progress Notes (Signed)
Subjective: 1 Day Post-Op Procedure(s) (LRB): Left hip acetabular revision (Left) Patient reports pain as moderate.  Making progress with therapy.  Although intra-op gram stain was negative, is culture did show rare staph.  Objective: Vital signs in last 24 hours: Temp:  [98 F (36.7 C)-98.3 F (36.8 C)] 98.2 F (36.8 C) (01/04 1444) Pulse Rate:  [68-71] 70 (01/04 1444) Resp:  [16-18] 16 (01/04 1444) BP: (109-139)/(57-73) 109/57 (01/04 1444) SpO2:  [94 %-99 %] 97 % (01/04 1444)  Intake/Output from previous day: 01/03 0701 - 01/04 0700 In: 5553.8 [P.O.:1070; I.V.:3818.8; IV Piggyback:665] Out: 5009 [Urine:1745; Blood:1500] Intake/Output this shift: Total I/O In: 600 [P.O.:600] Out: 800 [Urine:800]  Recent Labs    10/09/17 1243 10/10/17 0545  HGB 12.6* 12.4*   Recent Labs    10/09/17 1243 10/10/17 0545  WBC  --  9.5  RBC  --  4.01*  HCT 37.0* 36.4*  PLT  --  277   Recent Labs    10/09/17 1243 10/10/17 0545  NA 139 136  K 4.7 4.7  CL  --  107  CO2  --  25  BUN  --  17  CREATININE  --  1.12  GLUCOSE 125* 133*  CALCIUM  --  9.0   No results for input(s): LABPT, INR in the last 72 hours.  Sensation intact distally Intact pulses distally Dorsiflexion/Plantar flexion intact Incision: scant drainage  Assessment/Plan: 1 Day Post-Op Procedure(s) (LRB): Left hip acetabular revision (Left) Up with therapy - 50% weight only on left hip Will need PICC line for at least 6 weeks of IV antibiotics. Will start Vanc for now and follow cultures for sensitivities  Mcarthur Rossetti 10/10/2017, 6:42 PM

## 2017-10-10 NOTE — Progress Notes (Signed)
Pharmacy Antibiotic Note  [name] is a 72 y.o. 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  Pharmacy has been consulted for vancomycin dosing.  In 2017 during revision of hip, patient grew MSSA.  Due to migration of the cup, surgeon concerned for ongoing infection.  Synovial fluid from 1/3 OR culture growing S. Aureus.    Today, 10/10/2017  Renal: SCr WNL  WBC WNL  afebrile  Plan:  Vancomycin 2gm x 1 then 1000mg  IV q12h  Monitor renal function  Await susceptibilities   If vanco to continue for MRSA Infection, check levels as appropriate  Height: 6\' 3"  (190.5 cm) Weight: 264 lb (119.7 kg) IBW/kg (Calculated) : 84.5  Temp (24hrs), Avg:97.9 F (36.6 C), Min:97.7 F (36.5 C), Max:98.3 F (36.8 C)  Recent Labs  Lab 10/06/17 0837 10/10/17 0545  WBC 7.2 9.5  CREATININE 1.07 1.12    Estimated Creatinine Clearance: 83.1 mL/min (by C-G formula based on SCr of 1.12 mg/dL).    Allergies  Allergen Reactions  . Adhesive [Tape] Rash    Paper tape is ok    Antimicrobials this admission: 1/4  vanco >>  Dose adjustments this admission:  Microbiology results: 1/3 synovial fluid (hip) intra-op: S. aureus  Thank you for allowing pharmacy to be a part of this patient's care.  Doreene Eland, PharmD, BCPS.   Pager: [phone] 10/10/2017 12:23 PM

## 2017-10-10 NOTE — Progress Notes (Signed)
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 Hx of chronic L foot drop.     PT Comments    Progressing with mobility. Continued education for anterior precautions and PWB status. Will need to practice stair negotiation prior to discharge, once medically ready. Will continue to follow and progress activity.   Follow Up Recommendations  Home health PT     Equipment Recommendations  None recommended by PT    Recommendations for Other Services       Precautions / Restrictions Precautions Precautions: Anterior Hip Precaution Comments: Issued anterior precaution handout and reviewed pwb status. Wears L AFO.  Restrictions Weight Bearing Restrictions: Yes LLE Weight Bearing: Partial weight bearing LLE Partial Weight Bearing Percentage or Pounds: 50%    Mobility  Bed Mobility Overal bed mobility: Needs Assistance Bed Mobility: Sit to Supine      Sit to supine: Min assist;HOB elevated   General bed mobility comments: Assist for L LE. Cues for adherence to precautions.   Transfers Overall transfer level: Needs assistance Equipment used: Rolling walker (2 wheeled) Transfers: Sit to/from Stand Sit to Stand: Min guard         General transfer comment: close guard for safety. VCs safety, hand placement.   Ambulation/Gait Ambulation/Gait assistance: Min guard Ambulation Distance (Feet): 115 Feet Assistive device: Rolling walker (2 wheeled) Gait Pattern/deviations: Step-to pattern     General Gait Details: VCs safety, sequence, technique. Slow gait speed. Cues for adherence to PWB status.    Stairs            Wheelchair Mobility    Modified Rankin (Stroke Patients Only)       Balance Overall balance assessment: Needs assistance           Standing balance-Leahy Scale:  Poor                              Cognition Arousal/Alertness: Awake/alert Behavior During Therapy: WFL for tasks assessed/performed Overall Cognitive Status: Within Functional Limits for tasks assessed                                        Exercises Total Joint Exercises Ankle Circles/Pumps: AROM;Supine;Right;15 reps Quad Sets: AROM;Both;15 reps;Supine Heel Slides: AAROM;Left;15 reps;Supine    General Comments        Pertinent Vitals/Pain Pain Assessment: 0-10 Pain Score: 5  Pain Location: L hip Pain Descriptors / Indicators: Discomfort;Sore Pain Intervention(s): Monitored during session;Repositioned    Home Living Family/patient expects to be discharged to:: Private residence Living Arrangements: Spouse/significant other Available Help at Discharge: Family Type of Home: House Home Access: Ramped entrance   Home Layout: Multi-level Home Equipment: Environmental consultant - 2 wheels;Cane - single point      Prior Function            PT Goals (current goals can now be found in the care plan section) Acute Rehab PT Goals Patient Stated Goal: home PT Goal Formulation: With patient/family Time For Goal Achievement: 10/24/17 Potential to Achieve Goals: Good Progress towards PT goals: Progressing toward goals    Frequency    7X/week      PT Plan Current plan remains appropriate    Co-evaluation  AM-PAC PT "6 Clicks" Daily Activity  Outcome Measure  Difficulty turning over in bed (including adjusting bedclothes, sheets and blankets)?: A Little Difficulty moving from lying on back to sitting on the side of the bed? : Unable Difficulty sitting down on and standing up from a chair with arms (e.g., wheelchair, bedside commode, etc,.)?: A Little Help needed moving to and from a bed to chair (including a wheelchair)?: A Little Help needed walking in hospital room?: A Little Help needed climbing 3-5 steps with a railing? : A  Little 6 Click Score: 16    End of Session Equipment Utilized During Treatment: Gait belt Activity Tolerance: Patient tolerated treatment well Patient left: in bed;with call bell/phone within reach;with family/visitor present   PT Visit Diagnosis: Muscle weakness (generalized) (M62.81);Difficulty in walking, not elsewhere classified (R26.2);Pain Pain - Right/Left: Left Pain - part of body: Leg     Time: 7530-0511 PT Time Calculation (min) (ACUTE ONLY): 25 min  Charges:  $Gait Training: 8-22 mins $Therapeutic Exercise: 8-22 mins                    G Codes:          Weston Anna, MPT Pager: [phone]

## 2017-10-10 NOTE — Progress Notes (Signed)
Discharge planning, spoke with patient and spouse at beside. Chose [facility] for [facility] services, PT to eval and treat. Contacted [facility] for referral. Has RW and 3-n-1. May need a transfer bench but thinks they have one at home. [phone]

## 2017-10-11 LAB — CBC
HCT: 33.8 % — ABNORMAL LOW (ref 39.0–52.0)
Hemoglobin: 10.8 g/dL — ABNORMAL LOW (ref 13.0–17.0)
MCH: 29.3 pg (ref 26.0–34.0)
MCHC: 32 g/dL (ref 30.0–36.0)
MCV: 91.6 fL (ref 78.0–100.0)
PLATELETS: 231 10*3/uL (ref 150–400)
RBC: 3.69 MIL/uL — AB (ref 4.22–5.81)
RDW: 15.4 % (ref 11.5–15.5)
WBC: 9.4 10*3/uL (ref 4.0–10.5)

## 2017-10-11 MED ORDER — CEFAZOLIN SODIUM-DEXTROSE 2-4 GM/100ML-% IV SOLN
2.0000 g | Freq: Three times a day (TID) | INTRAVENOUS | Status: DC
  Administered 2017-10-11 – 2017-10-13 (×6): 2 g via INTRAVENOUS
  Filled 2017-10-11 (×6): qty 100

## 2017-10-11 NOTE — Progress Notes (Signed)
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 Hx of chronic L foot drop.     PT Comments    Pt motivated and moving well.  Pt hopeful for dc tomorrow.   Follow Up Recommendations  Home health PT     Equipment Recommendations  None recommended by PT    Recommendations for Other Services       Precautions / Restrictions Precautions Precautions: Anterior Hip Precaution Comments: Reviewed all precautions Restrictions Weight Bearing Restrictions: Yes LLE Weight Bearing: Partial weight bearing LLE Partial Weight Bearing Percentage or Pounds: 50%    Mobility  Bed Mobility Overal bed mobility: Needs Assistance Bed Mobility: Supine to Sit     Supine to sit: Min assist;HOB elevated     General bed mobility comments: Assist for L LE. Cues for adherence to precautions.   Transfers Overall transfer level: Needs assistance Equipment used: Rolling walker (2 wheeled) Transfers: Sit to/from Stand Sit to Stand: Min guard         General transfer comment: close guard for safety. VCs safety, hand placement.   Ambulation/Gait Ambulation/Gait assistance: Min guard Ambulation Distance (Feet): 150 Feet Assistive device: Rolling walker (2 wheeled) Gait Pattern/deviations: Step-to pattern Gait velocity: decr Gait velocity interpretation: Below normal speed for age/gender General Gait Details: VCs safety, sequence, technique. Slow gait speed. Cues for adherence to PWB status.    Stairs            Wheelchair Mobility    Modified Rankin (Stroke Patients Only)       Balance Overall balance assessment: Needs assistance Sitting-balance support: No upper extremity supported;Feet supported Sitting balance-Leahy Scale: Good     Standing balance support: No upper extremity supported Standing  balance-Leahy Scale: Fair                              Cognition Arousal/Alertness: Awake/alert Behavior During Therapy: WFL for tasks assessed/performed Overall Cognitive Status: Within Functional Limits for tasks assessed                                        Exercises Total Joint Exercises Ankle Circles/Pumps: AROM;Supine;Right;15 reps Quad Sets: AROM;Both;15 reps;Supine Heel Slides: AAROM;Left;15 reps;Supine    General Comments        Pertinent Vitals/Pain Pain Assessment: 0-10 Pain Score: 5  Pain Location: L hip Pain Descriptors / Indicators: Discomfort;Sore Pain Intervention(s): Limited activity within patient's tolerance;Monitored during session;Premedicated before session;Ice applied    Home Living                      Prior Function            PT Goals (current goals can now be found in the care plan section) Acute Rehab PT Goals Patient Stated Goal: home PT Goal Formulation: With patient/family Time For Goal Achievement: 10/24/17 Potential to Achieve Goals: Good Progress towards PT goals: Progressing toward goals    Frequency    7X/week      PT Plan Current plan remains appropriate    Co-evaluation              AM-PAC PT "6 Clicks" Daily Activity  Outcome Measure  Difficulty turning  over in bed (including adjusting bedclothes, sheets and blankets)?: A Little Difficulty moving from lying on back to sitting on the side of the bed? : Unable Difficulty sitting down on and standing up from a chair with arms (e.g., wheelchair, bedside commode, etc,.)?: A Little Help needed moving to and from a bed to chair (including a wheelchair)?: A Little Help needed walking in hospital room?: A Little Help needed climbing 3-5 steps with a railing? : A Little 6 Click Score: 16    End of Session   Activity Tolerance: Patient tolerated treatment well Patient left: in chair;with call bell/phone within reach;with  family/visitor present Nurse Communication: Mobility status PT Visit Diagnosis: Muscle weakness (generalized) (M62.81);Difficulty in walking, not elsewhere classified (R26.2);Pain Pain - Right/Left: Left Pain - part of body: Leg     Time: 8119-1478 PT Time Calculation (min) (ACUTE ONLY): 36 min  Charges:  $Gait Training: 8-22 mins $Therapeutic Exercise: 8-22 mins                    G Codes:       Pg [phone]    Saket Hellstrom 10/11/2017, 9:14 AM

## 2017-10-11 NOTE — Progress Notes (Signed)
Patient ID: [name], male   DOB: Jan 21, 1946, 72 y.o.   MRN: [id number] Postoperative day 1 status post revision total hip arthroplasty.  Patient is scheduled for a PICC line planned for discharge to home with home health antibiotics and PICC line.

## 2017-10-11 NOTE — Progress Notes (Signed)
Unable to contact RN to notify PICC to be placed in the am.  Attempted multiple times.  Message left for RN.

## 2017-10-11 NOTE — Progress Notes (Signed)
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 Hx of chronic L foot drop.     PT Comments    Pt continues very motivated and progressing steadily with mobility including negotiating stairs this pm with crutch and rail.   Follow Up Recommendations  Home health PT     Equipment Recommendations  None recommended by PT    Recommendations for Other Services       Precautions / Restrictions Precautions Precautions: Anterior Hip Precaution Comments: Reviewed all precautions Restrictions Weight Bearing Restrictions: Yes LLE Weight Bearing: Partial weight bearing LLE Partial Weight Bearing Percentage or Pounds: 50%    Mobility  Bed Mobility Overal bed mobility: Needs Assistance Bed Mobility: Supine to Sit     Supine to sit: Min assist;HOB elevated     General bed mobility comments: Assist for L LE. Cues for adherence to precautions.   Transfers Overall transfer level: Needs assistance Equipment used: Rolling walker (2 wheeled) Transfers: Sit to/from Stand Sit to Stand: Min guard         General transfer comment: close guard for safety. VCs safety, hand placement.   Ambulation/Gait Ambulation/Gait assistance: Min guard Ambulation Distance (Feet): 100 Feet Assistive device: Rolling walker (2 wheeled) Gait Pattern/deviations: Step-to pattern Gait velocity: decr Gait velocity interpretation: Below normal speed for age/gender General Gait Details: VCs safety, sequence, technique. Slow gait speed. Cues for adherence to PWB status.    Stairs Stairs: Yes   Stair Management: One rail Left;Step to pattern;Forwards;With crutches Number of Stairs: 4 General stair comments: cues for sequence and foot/crutch placement  Wheelchair Mobility    Modified Rankin (Stroke Patients Only)        Balance Overall balance assessment: Needs assistance Sitting-balance support: No upper extremity supported;Feet supported Sitting balance-Leahy Scale: Good     Standing balance support: No upper extremity supported Standing balance-Leahy Scale: Fair                              Cognition Arousal/Alertness: Awake/alert Behavior During Therapy: WFL for tasks assessed/performed Overall Cognitive Status: Within Functional Limits for tasks assessed                                        Exercises      General Comments        Pertinent Vitals/Pain Pain Assessment: 0-10 Pain Score: 4  Pain Location: L hip Pain Descriptors / Indicators: Discomfort;Sore Pain Intervention(s): Limited activity within patient's tolerance;Monitored during session;Premedicated before session;Ice applied    Home Living                      Prior Function            PT Goals (current goals can now be found in the care plan section) Acute Rehab PT Goals Patient Stated Goal: home PT Goal Formulation: With patient/family Time For Goal Achievement: 10/24/17 Potential to Achieve Goals: Good Progress towards PT goals: Progressing toward goals    Frequency    7X/week      PT Plan Current plan remains appropriate    Co-evaluation              AM-PAC PT "6  Clicks" Daily Activity  Outcome Measure  Difficulty turning over in bed (including adjusting bedclothes, sheets and blankets)?: A Little Difficulty moving from lying on back to sitting on the side of the bed? : Unable Difficulty sitting down on and standing up from a chair with arms (e.g., wheelchair, bedside commode, etc,.)?: A Little Help needed moving to and from a bed to chair (including a wheelchair)?: A Little Help needed walking in hospital room?: A Little Help needed climbing 3-5 steps with a railing? : A Little 6 Click Score: 16    End of Session Equipment Utilized During Treatment:  Gait belt Activity Tolerance: Patient tolerated treatment well Patient left: in chair;with call bell/phone within reach;with family/visitor present Nurse Communication: Mobility status PT Visit Diagnosis: Muscle weakness (generalized) (M62.81);Difficulty in walking, not elsewhere classified (R26.2);Pain Pain - Right/Left: Left Pain - part of body: Leg     Time: 0175-1025 PT Time Calculation (min) (ACUTE ONLY): 27 min  Charges:  $Gait Training: 8-22 mins $Therapeutic Activity: 8-22 mins                    G Codes:       Pg [phone]    Dianey Suchy 10/11/2017, 4:45 PM

## 2017-10-12 LAB — BASIC METABOLIC PANEL
Anion gap: 5 (ref 5–15)
BUN: 16 mg/dL (ref 6–20)
CHLORIDE: 105 mmol/L (ref 101–111)
CO2: 25 mmol/L (ref 22–32)
Calcium: 8.8 mg/dL — ABNORMAL LOW (ref 8.9–10.3)
Creatinine, Ser: 0.98 mg/dL (ref 0.61–1.24)
GFR calc Af Amer: >60 mL/min (ref >60)
GFR calc non Af Amer: >60 mL/min (ref >60)
GLUCOSE: 113 mg/dL — AB (ref 65–99)
Potassium: 3.8 mmol/L (ref 3.5–5.1)
Sodium: 135 mmol/L (ref 135–145)

## 2017-10-12 LAB — C-REACTIVE PROTEIN: CRP: 11.5 mg/dL — ABNORMAL HIGH (ref <1.0)

## 2017-10-12 LAB — CBC
HEMATOCRIT: 33.1 % — AB (ref 39.0–52.0)
HEMOGLOBIN: 10.6 g/dL — AB (ref 13.0–17.0)
MCH: 29.2 pg (ref 26.0–34.0)
MCHC: 32 g/dL (ref 30.0–36.0)
MCV: 91.2 fL (ref 78.0–100.0)
Platelets: 233 10*3/uL (ref 150–400)
RBC: 3.63 MIL/uL — ABNORMAL LOW (ref 4.22–5.81)
RDW: 15.3 % (ref 11.5–15.5)
WBC: 7.9 10*3/uL (ref 4.0–10.5)

## 2017-10-12 LAB — SEDIMENTATION RATE: Sed Rate: 45 mm/hr — ABNORMAL HIGH (ref 0–16)

## 2017-10-12 MED ORDER — SODIUM CHLORIDE 0.9% FLUSH: 10.0000 mL | INTRAVENOUS | Status: DC | PRN

## 2017-10-12 NOTE — Progress Notes (Signed)
Patient ID: [name], male   DOB: 02-02-1946, 73 y.o.   MRN: [id number] Patient has staph aureus is susceptible to Kefzol.  Orders are written for home health IV antibiotics with Kefzol.  Patient had his PICC line placed yesterday.  I discussed with his caseworker this morning and plan for discharge to home Monday with advanced home care for IV antibiotics.  Face-to-face completed for home health nursing.

## 2017-10-12 NOTE — Progress Notes (Signed)
Pharmacy Antibiotic Note  [name] is a 72 y.o. 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  Pharmacy has been consulted for vancomycin dosing.  In 2017 during revision of hip, patient grew MSSA.  Due to migration of the cup, surgeon concerned for ongoing infection.  Synovial fluid from 1/3 OR culture growing S. Aureus.     1/5: after d/w Dr. Sharol Given, will convert to Ancef and place OPAT consult  Plan:  Ancef 2g IV q8 hr, pharmacy to sign off  OPAT consult placed with tentative stop date of 2/13  Most recent note from Dr. Ninfa Linden states "at least 6 weeks total abx"; pharmacist please confirm stop date with Dr. Ninfa Linden on Monday and enter OPAT orders accordingly  Height: 6\' 3"  (190.5 cm) Weight: 264 lb (119.7 kg) IBW/kg (Calculated) : 84.5  Temp (24hrs), Avg:98.5 F (36.9 C), Min:98.3 F (36.8 C), Max:98.7 F (37.1 C)  Recent Labs  Lab 10/06/17 0837 10/10/17 0545 10/11/17 0555 10/12/17 0549  WBC 7.2 9.5 9.4 7.9  CREATININE 1.07 1.12  --  0.98    Estimated Creatinine Clearance: 95 mL/min (by C-G formula based on SCr of 0.98 mg/dL).    Allergies  Allergen Reactions  . Adhesive [Tape] Rash    Paper tape is ok     Thank you for allowing pharmacy to be a part of this patient's care.  Reuel Boom, PharmD, BCPS Pager: [phone] 10/12/2017, 12:29 PM

## 2017-10-12 NOTE — Progress Notes (Signed)
Peripherally Inserted Central Catheter/Midline Placement  The IV Nurse has discussed with the patient and/or persons authorized to consent for the patient, the purpose of this procedure and the potential benefits and risks involved with this procedure.  The benefits include less needle sticks, lab draws from the catheter, and the patient may be discharged home with the catheter. Risks include, but not limited to, infection, bleeding, blood clot (thrombus formation), and puncture of an artery; nerve damage and irregular heartbeat and possibility to perform a PICC exchange if needed/ordered by physician.  Alternatives to this procedure were also discussed.  Bard Power PICC patient education guide, fact sheet on infection prevention and patient information card has been provided to patient /or left at bedside.    PICC/Midline Placement Documentation  PICC Single Lumen 06/10/16 PICC Right Brachial 45 cm 1 cm (Active)     PICC Single Lumen 10/12/17 PICC Right Cephalic 42 cm 0 cm (Active)  Indication for Insertion or Continuance of Line Home intravenous therapies (PICC only) 10/12/2017  9:30 AM  Exposed Catheter (cm) 0 cm 10/12/2017  9:30 AM  Site Assessment Clean;Dry;Intact 10/12/2017  9:30 AM  Line Status Flushed;Saline locked;Blood return noted 10/12/2017  9:30 AM  Dressing Type Transparent 10/12/2017  9:30 AM  Dressing Status Clean;Dry;Intact;Antimicrobial disc in place 10/12/2017  9:30 AM  Line Care Connections checked and tightened 10/12/2017  9:30 AM  Line Adjustment (NICU/IV Team Only) No 10/12/2017  9:30 AM  Dressing Intervention New dressing 10/12/2017  9:30 AM  Dressing Change Due 10/19/17 10/12/2017  9:30 AM       Rolena Infante 10/12/2017, 9:31 AM

## 2017-10-12 NOTE — Progress Notes (Signed)
Occupational Therapy Treatment Patient Details Name: [name] MRN: [id number] DOB: [birth year] Today's Date: 10/12/2017    History of present illness 72 yo male s/p revision of acetabular component of previous L THA 10/09/17. Hx of chronic L foot drop.    OT comments  Pt doing well. Practiced LB dressing with AE and reviewed all hip precautions. Pt has 3in1 and they have options for obtaining a tub bench. Pt's wife present for session. Pt doing well with self care tasks.   Follow Up Recommendations  No OT follow up    Equipment Recommendations  None recommended by OT    Recommendations for Other Services      Precautions / Restrictions Precautions Precautions: Anterior Hip Precaution Comments: reviewed all precautions with pt and spouse Restrictions Weight Bearing Restrictions: Yes LLE Weight Bearing: Partial weight bearing LLE Partial Weight Bearing Percentage or Pounds: 50%       Mobility Bed Mobility Overal bed mobility: Needs Assistance Bed Mobility: Supine to Sit     Supine to sit: Min assist     General bed mobility comments: Assist for L LE and min verbal cues for technique and hip precaution adherence. Bed in flat position.  Transfers Overall transfer level: Needs assistance Equipment used: Rolling walker (2 wheeled) Transfers: Sit to/from Stand Sit to Stand: Min guard         General transfer comment: cues for hand placement and safety.    Balance                                           ADL either performed or assessed with clinical judgement   ADL                       Lower Body Dressing: Min guard;Adhering to hip precautions;Sitting/lateral leans Lower Body Dressing Details (indicate cue type and reason): educated pt on how to doff L sock with reacher. Pt performed with min guard assist at EOB. Pt has AFO brace that his wife assists with donning.  Pt is familiar with using a sock aid as he owns one and has used  it in the past.  Toilet Transfer: Min guard;Stand-pivot;RW             General ADL Comments: Pt's wife states they have options for obtaining at tub transfer bench. Educated on how to use properly and demonstrated use. Pt has a sock aid, long handled shoe horn and sponge. They state they can obtain a reacher on their own. Educated on importance of using ice frequently and moving also frequently. Reviewed all hip precautions and how they apply to ADL including tub transfers and LB dressing.      Vision Patient Visual Report: No change from baseline     Perception     Praxis      Cognition Arousal/Alertness: Awake/alert Behavior During Therapy: WFL for tasks assessed/performed Overall Cognitive Status: Within Functional Limits for tasks assessed                                          Exercises     Shoulder Instructions       General Comments      Pertinent Vitals/ Pain       Pain Assessment: 0-10  Pain Score: 3  Pain Location: L hip Pain Descriptors / Indicators: Sore Pain Intervention(s): Monitored during session;Ice applied  Home Living                                          Prior Functioning/Environment              Frequency  Min 2X/week        Progress Toward Goals  OT Goals(current goals can now be found in the care plan section)  Progress towards OT goals: Progressing toward goals     Plan      Co-evaluation                 AM-PAC PT "6 Clicks" Daily Activity     Outcome Measure   Help from another person eating meals?: None Help from another person taking care of personal grooming?: A Little Help from another person toileting, which includes using toliet, bedpan, or urinal?: A Little Help from another person bathing (including washing, rinsing, drying)?: A Little Help from another person to put on and taking off regular upper body clothing?: None Help from another person to put on and taking  off regular lower body clothing?: A Little 6 Click Score: 20    End of Session Equipment Utilized During Treatment: Rolling walker  OT Visit Diagnosis: Unsteadiness on feet (R26.81)   Activity Tolerance Patient tolerated treatment well   Patient Left in chair;with call bell/phone within reach;with family/visitor present   Nurse Communication          Time: 4098-1191 OT Time Calculation (min): 28 min  Charges: OT General Charges $OT Visit: 1 Visit OT Treatments $Self Care/Home Management : 8-22 mins $Therapeutic Activity: 8-22 mins     Zannie Kehr [name] 10/12/2017, 10:48 AM

## 2017-10-12 NOTE — Care Management Note (Addendum)
Case Management Note  Patient Details  Name: [name] MRN: [id number] Date of Birth: 12-24-1945  Subjective/Objective:     Left hip acetabular revision                  Action/Plan: Discharge Planning: NCM spoke to pt and he has RW and bedside commode at home. Offered choice for [facility] and pt states he had [facility] in the past. Sent notification to [facility]. Pt will dc home on IV abx. Notified [facility] rep and scheduled dc on 1/7/201. Will need OPAT orders completed for IV abx. Notified pharmacist and will clarify with attending length of therapy.  PCP Tammi Sou MD   Expected Discharge Date:                  Expected Discharge Plan:  [place]  In-House Referral:  NA  Discharge planning Services  CM Consult  Post Acute Care Choice:  Home Health Choice offered to:  Patient  DME Arranged:  N/A DME Agency:  NA  HH Arranged:  PT, RN, IV Antibiotics HH Agency:  [facility]  Status of Service:  Completed, signed off  If discussed at [place] of Stay Meetings, dates discussed:    Additional Comments:  Erenest Rasher, RN 10/12/2017, 10:33 AM

## 2017-10-12 NOTE — Progress Notes (Signed)
Physical Therapy Treatment Patient Details Name: [name] MRN: [id number] DOB: 08-21-1946 Today's Date: 10/12/2017    History of Present Illness 72 yo male s/p revision of acetabular component of previous L THA 10/09/17. Hx of chronic L foot drop.     PT Comments    Pt continues to progress well with mobility and hopeful for dc home tomorrow.   Follow Up Recommendations  Home health PT     Equipment Recommendations  None recommended by PT    Recommendations for Other Services       Precautions / Restrictions Precautions Precautions: Anterior Hip Precaution Comments: Pt recalls all ant/lat THP Restrictions Weight Bearing Restrictions: Yes LLE Weight Bearing: Partial weight bearing LLE Partial Weight Bearing Percentage or Pounds: 50%    Mobility  Bed Mobility Overal bed mobility: Needs Assistance Bed Mobility: Supine to Sit;Sit to Supine     Supine to sit: Min assist Sit to supine: Min assist   General bed mobility comments: min cues for technique and min assist for L LE  Transfers Overall transfer level: Needs assistance Equipment used: Rolling walker (2 wheeled) Transfers: Sit to/from Stand Sit to Stand: Supervision         General transfer comment: min cues for hand placement and safety.  Ambulation/Gait Ambulation/Gait assistance: Supervision Ambulation Distance (Feet): 170 Feet(and 15' back from bathroom) Assistive device: Rolling walker (2 wheeled) Gait Pattern/deviations: Step-to pattern Gait velocity: decr Gait velocity interpretation: Below normal speed for age/gender General Gait Details: cues for posture, position from RW and PWB   Stairs Stairs: Yes   Stair Management: One rail Left;Step to pattern;Forwards;With crutches   General stair comments: cues for sequence and foot/crutch placement  Wheelchair Mobility    Modified Rankin (Stroke Patients Only)       Balance Overall balance assessment: Needs  assistance Sitting-balance support: No upper extremity supported;Feet supported Sitting balance-Leahy Scale: Good     Standing balance support: No upper extremity supported Standing balance-Leahy Scale: Fair                              Cognition Arousal/Alertness: Awake/alert Behavior During Therapy: WFL for tasks assessed/performed Overall Cognitive Status: Within Functional Limits for tasks assessed                                        Exercises      General Comments        Pertinent Vitals/Pain Pain Assessment: 0-10 Pain Score: 4  Pain Location: L hip Pain Descriptors / Indicators: Sore Pain Intervention(s): Limited activity within patient's tolerance;Monitored during session;Patient requesting pain meds-RN notified;Ice applied    Home Living                      Prior Function            PT Goals (current goals can now be found in the care plan section) Acute Rehab PT Goals Patient Stated Goal: home PT Goal Formulation: With patient/family Time For Goal Achievement: 10/24/17 Potential to Achieve Goals: Good Progress towards PT goals: Progressing toward goals    Frequency    7X/week      PT Plan Current plan remains appropriate    Co-evaluation              AM-PAC PT "6 Clicks" Daily Activity  Outcome  Measure  Difficulty turning over in bed (including adjusting bedclothes, sheets and blankets)?: A Little Difficulty moving from lying on back to sitting on the side of the bed? : A Lot Difficulty sitting down on and standing up from a chair with arms (e.g., wheelchair, bedside commode, etc,.)?: A Little Help needed moving to and from a bed to chair (including a wheelchair)?: A Little Help needed walking in hospital room?: A Little Help needed climbing 3-5 steps with a railing? : A Little 6 Click Score: 17    End of Session Equipment Utilized During Treatment: Gait belt Activity Tolerance: Patient  tolerated treatment well Patient left: in bed;with call bell/phone within reach;with family/visitor present Nurse Communication: Mobility status PT Visit Diagnosis: Muscle weakness (generalized) (M62.81);Difficulty in walking, not elsewhere classified (R26.2);Pain Pain - Right/Left: Left Pain - part of body: Leg     Time: 1458-1530 PT Time Calculation (min) (ACUTE ONLY): 32 min  Charges:  $Gait Training: 23-37 mins                    G Codes:       Pg [phone]    Quasean Frye 10/12/2017, 3:51 PM

## 2017-10-12 NOTE — Progress Notes (Signed)
Physical Therapy Treatment Patient Details Name: [name] MRN: [id number] DOB: [birth year] Today's Date: 10/12/2017    History of Present Illness 72 yo male s/p revision of acetabular component of previous L THA 10/09/17. Hx of chronic L foot drop.     PT Comments    Pt in good spirits and progressing steadily with mobility tasks.  Pt hopeful for dc home tomorrow.   Follow Up Recommendations  Home health PT     Equipment Recommendations  None recommended by PT    Recommendations for Other Services       Precautions / Restrictions Precautions Precautions: Anterior Hip Precaution Comments: Pt recalls all ant/lat THP Restrictions Weight Bearing Restrictions: Yes LLE Weight Bearing: Partial weight bearing LLE Partial Weight Bearing Percentage or Pounds: 50%    Mobility  Bed Mobility Overal bed mobility: Needs Assistance Bed Mobility: Supine to Sit     Supine to sit: Min assist     General bed mobility comments: Assist for L LE and min verbal cues for technique and hip precaution adherence. Bed in flat position.  Transfers Overall transfer level: Needs assistance Equipment used: Rolling walker (2 wheeled) Transfers: Sit to/from Stand Sit to Stand: Min guard;Supervision         General transfer comment: min cues for hand placement and safety.  Ambulation/Gait Ambulation/Gait assistance: Min guard;Supervision Ambulation Distance (Feet): 150 Feet Assistive device: Rolling walker (2 wheeled) Gait Pattern/deviations: Step-to pattern Gait velocity: decr Gait velocity interpretation: Below normal speed for age/gender General Gait Details: min cues for posture, position from RW and PWB   Stairs Stairs: Yes   Stair Management: One rail Left;Step to pattern;Forwards;With crutches   General stair comments: cues for sequence and foot/crutch placement  Wheelchair Mobility    Modified Rankin (Stroke Patients Only)       Balance                                             Cognition Arousal/Alertness: Awake/alert Behavior During Therapy: WFL for tasks assessed/performed Overall Cognitive Status: Within Functional Limits for tasks assessed                                        Exercises      General Comments        Pertinent Vitals/Pain Pain Assessment: 0-10 Pain Score: 3  Pain Location: L hip Pain Descriptors / Indicators: Sore Pain Intervention(s): Limited activity within patient's tolerance;Monitored during session;Premedicated before session;Ice applied    Home Living                      Prior Function            PT Goals (current goals can now be found in the care plan section) Acute Rehab PT Goals Patient Stated Goal: home PT Goal Formulation: With patient/family Time For Goal Achievement: 10/24/17 Potential to Achieve Goals: Good Progress towards PT goals: Progressing toward goals    Frequency    7X/week      PT Plan Current plan remains appropriate    Co-evaluation              AM-PAC PT "6 Clicks" Daily Activity  Outcome Measure  Difficulty turning over in bed (including adjusting bedclothes, sheets and blankets)?:  A Little Difficulty moving from lying on back to sitting on the side of the bed? : Unable Difficulty sitting down on and standing up from a chair with arms (e.g., wheelchair, bedside commode, etc,.)?: A Little Help needed moving to and from a bed to chair (including a wheelchair)?: A Little Help needed walking in hospital room?: A Little Help needed climbing 3-5 steps with a railing? : A Little 6 Click Score: 16    End of Session Equipment Utilized During Treatment: Gait belt Activity Tolerance: Patient tolerated treatment well Patient left: in chair;with call bell/phone within reach;with family/visitor present Nurse Communication: Mobility status PT Visit Diagnosis: Muscle weakness (generalized) (M62.81);Difficulty in walking, not  elsewhere classified (R26.2);Pain Pain - Right/Left: Left Pain - part of body: Leg     Time: 9629-5284 PT Time Calculation (min) (ACUTE ONLY): 28 min  Charges:  $Gait Training: 23-37 mins                    G Codes:       Pg [phone]    Jazzy Parmer 10/12/2017, 12:48 PM

## 2017-10-13 ENCOUNTER — Telehealth (INDEPENDENT_AMBULATORY_CARE_PROVIDER_SITE_OTHER): Payer: Self-pay | Admitting: Radiology

## 2017-10-13 ENCOUNTER — Encounter (HOSPITAL_COMMUNITY): Payer: Self-pay | Admitting: Orthopaedic Surgery

## 2017-10-13 MED ORDER — OXYCODONE-ACETAMINOPHEN 5-325 MG PO TABS: 1.0000 | ORAL_TABLET | ORAL | 0 refills | Status: DC | PRN

## 2017-10-13 MED ORDER — CEFAZOLIN SODIUM-DEXTROSE 2-4 GM/100ML-% IV SOLN: 2.0000 g | Freq: Three times a day (TID) | INTRAVENOUS | Status: DC

## 2017-10-13 NOTE — Telephone Encounter (Signed)
We need to do this ASAP.  Also, the end date would be The end date of the Ancef would be 11/20/17.

## 2017-10-13 NOTE — Telephone Encounter (Signed)
PA# [id number], I have notified Pam, this is for brand name only per insurance, valid through 10/06/2018.

## 2017-10-13 NOTE — Discharge Summary (Signed)
Patient ID: [name] MRN: [id number] DOB/AGE: March 09, 1946 72 y.o.  Admit date: 10/09/2017 Discharge date: 10/13/2017  Admission Diagnoses:  Principal Problem:   Failed total hip arthroplasty [facility]) Active Problems:   History of revision of total replacement of left hip joint   Discharge Diagnoses:  Same  Past Medical History:  Diagnosis Date  . Anterior dislocation of right shoulder 09/2013   Reduced in ED under sedation.  Dislocated posteriorly 2015, ? partial RC tear, has f/u planned with Dr. Marlou Sa--? surgery? possible plan for 2016.  [name] Blood donor, whole blood    Has donated approx 30 gallons in his lifetime  . 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  CT hip 08/2017 w/acetabular erosion--findings worrisome for particle disease vs infection--ortho checking labs and possibly doing jt aspiration for G stain/culture---needs hip surgery either way..  . Chronic pain syndrome    Candidate for spinal cord stimulator (03/2017)  . Chronic renal insufficiency, stage II (mild)   . Chronic venous insufficiency    +compression hose  . DDD (degenerative disc disease), lumbar 2017   Dr. Noe Gens ortho.  He is now wearing a new leg brace.  Has had ESI and will get another.  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  Xarelto x 109mo at full dosing, then 6 more months at 1/2 dosing, then stopped xarelto.    . Foot drop, left    s/p hip fracture 1969  . 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  As of urol f/u 08/2014, plan is for me to follow PSAs and fax results to Dr. Junious Silk: PSA 02/2015 was 0.21 (stable).  Stable on f/u with Dr. Junious Silk 08/2015 and 11/2016.  [name] Hx of adenomatous colonic polyps     polypectomy 2007; 2012; 2015 (+adenomatous, no high grade dysplasia); recall 5 yrs  . Hypercholesterolemia 02/2017   Recommended statin 02/10/17 but pt declined, wanted to do TLC trial first.  . Osteoarthritis    knees and hips primarily  . Toe infection 05/27/2013   Left great toe and right 2nd toe  . Trochanteric bursitis of left hip    Steroid injection by ortho 09/10/16  . Urge incontinence    vesicare helpful    Surgeries: Procedure(s): Left hip acetabular revision on 10/09/2017   Consultants:   Discharged Condition: Improved  Hospital Course: [name] is an 72 y.o. male who was admitted 10/09/2017 for operative treatment ofFailed total hip arthroplasty ([place]). Patient has severe unremitting pain that affects sleep, daily activities, and work/hobbies. After pre-op clearance the patient was taken to the operating room on 10/09/2017 and underwent  Procedure(s): Left hip acetabular revision.    Patient was given perioperative antibiotics:  Anti-infectives (From admission, onward)   Start     Dose/Rate Route Frequency Ordered Stop   10/13/17 0000  ceFAZolin (ANCEF) 2-4 GM/100ML-% IVPB     2 g 200 mL/hr over 30 Minutes Intravenous Every 8 hours 10/13/17 0731     10/11/17 1500  ceFAZolin (ANCEF) IVPB 2g/100 mL premix     2 g 200 mL/hr over 30 Minutes Intravenous Every 8 hours 10/11/17 1437     10/10/17 2200  vancomycin (VANCOCIN) IVPB 1000 mg/200 mL premix  Status:  Discontinued  1,000 mg 200 mL/hr over 60 Minutes Intravenous Every 12 hours 10/10/17 1224 10/11/17 1437   10/10/17 1400  vancomycin (VANCOCIN) 2,000 mg in sodium chloride 0.9 % 500 mL IVPB     2,000 mg 250 mL/hr over 120 Minutes Intravenous  Once 10/10/17 1224 10/10/17 1513   10/09/17 1600  ceFAZolin (ANCEF) IVPB 2g/100 mL premix     2 g 200 mL/hr over 30 Minutes Intravenous Every 6 hours 10/09/17 1438 10/09/17 2159   10/09/17 0737  ceFAZolin (ANCEF) 2-4 GM/100ML-% IVPB    Comments:  [name]   : cabinet  override      10/09/17 0737 10/09/17 0952   10/09/17 0732  ceFAZolin (ANCEF) IVPB 2g/100 mL premix     2 g 200 mL/hr over 30 Minutes Intravenous On call to O.R. 10/09/17 0732 10/09/17 1007       Patient was given sequential compression devices, early ambulation, and chemoprophylaxis to prevent DVT.  Patient benefited maximally from hospital stay and there were no complications.  His cultures did grow rare MSSA.  Recent vital signs:  Patient Vitals for the past 24 hrs:  BP Temp Temp src Pulse Resp SpO2  10/13/17 0524 133/66 98.9 F (37.2 C) Oral 65 13 100 %  10/12/17 2053 (!) 143/59 99 F (37.2 C) Oral 73 15 100 %  10/12/17 1342 139/65 98.2 F (36.8 C) Oral 63 14 100 %     Recent laboratory studies:  Recent Labs    10/11/17 0555 10/12/17 0549  WBC 9.4 7.9  HGB 10.8* 10.6*  HCT 33.8* 33.1*  PLT 231 233  NA  --  135  K  --  3.8  CL  --  105  CO2  --  25  BUN  --  16  CREATININE  --  0.98  GLUCOSE  --  113*  CALCIUM  --  8.8*     Discharge Medications:   Allergies as of 10/13/2017      Reactions   Adhesive [tape] Rash   Paper tape is ok      Medication List    STOP taking these medications   aspirin 81 MG tablet     TAKE these medications   acetaminophen 500 MG tablet Commonly known as:  TYLENOL Take 1,000 mg by mouth 2 (two) times daily as needed for moderate pain or headache.   ceFAZolin 2-4 GM/100ML-% IVPB Commonly known as:  ANCEF Inject 100 mLs (2 g total) into the vein every 8 (eight) hours.   gabapentin 100 MG capsule Commonly known as:  NEURONTIN TAKE 1 CAPSULE (100 MG TOTAL) BY MOUTH 3 (THREE) TIMES DAILY. What changed:    how much to take  when to take this   oxyCODONE-acetaminophen 5-325 MG tablet Commonly known as:  ROXICET Take 1-2 tablets by mouth every 4 (four) hours as needed.   solifenacin 5 MG tablet Commonly known as:  VESICARE Take 5 mg by mouth daily.            Durable Medical Equipment  (From admission, onward)         Start     Ordered   10/09/17 1439  DME 3 n 1  Once     10/09/17 1438   10/09/17 1439  DME Walker rolling  Once    Question:  Patient needs a walker to treat with the following condition  Answer:  Status post revision of total hip replacement   10/09/17 1438      Diagnostic Studies:  Dg Pelvis Portable  Result Date: 10/09/2017 CLINICAL DATA:  Postoperative left acetabular revision. EXAM: PORTABLE PELVIS 1-2 VIEWS COMPARISON:  Fluoro spot radiographs of earlier today. FINDINGS: The patient has undergone re- CT Ing of the acetabulum with placement of 7 additional cortical screws. The prosthetic femoral head appears appropriately position within the prosthetic acetabular cup. IMPRESSION: No postprocedure complication observed following prosthetic left acetabular revision. Electronically Signed   By: David  Martinique M.D.   On: 10/09/2017 13:55   Dg C-arm 1-60 Min-no Report  Result Date: 10/09/2017 Fluoroscopy was utilized by the requesting physician.  No radiographic interpretation.   Dg Hip Operative Unilat W Or W/o Pelvis Left  Result Date: 10/09/2017 CLINICAL DATA:  Division of left hip prosthesis. EXAM: OPERATIVE left HIP (WITH PELVIS IF PERFORMED) 2 VIEWS TECHNIQUE: Fluoroscopic spot image(s) were submitted for interpretation post-operatively. COMPARISON:  06/07/2016 FINDINGS: The first fluoroscopic spot image demonstrates a fairly vertical orientation of the acetabular cup. Subsequent image demonstrates normal orientation of the acetabular cup with numerous fixating screws. The femoral prosthesis is intact and unchanged. IMPRESSION: Revision of the acetabular cup with no complicating feature. Electronically Signed   By: Marijo Sanes M.D.   On: 10/09/2017 15:48    Disposition: 01-Home or Self Care    Follow-up Information    [facility] Follow up.   Specialty:  Home Health Services Why:  Home Health RN and Physical Therapy - agency will call to arrange initial  visit Contact information: [address] [place] [address] [phone]        Mcarthur Rossetti, MD. Schedule an appointment as soon as possible for a visit in 2 week(s).   Specialty:  Orthopedic Surgery Contact information: [place] [address] [phone]            Signed: Mcarthur Rossetti 10/13/2017, 7:31 AM

## 2017-10-13 NOTE — Care Management Important Message (Signed)
Important Message  Patient Details  Name: [name] MRN: [id number] Date of Birth: [birth year]   Medicare Important Message Given:  Yes    Kerin Salen 10/13/2017, 10:58 AMImportant Message  Patient Details  Name: [name] MRN: [id number] Date of Birth: [birth year]   Medicare Important Message Given:  Yes    Kerin Salen 10/13/2017, 10:58 AM

## 2017-10-13 NOTE — Progress Notes (Signed)
Physical Therapy Treatment Patient Details Name: [name] MRN: [id number] DOB: Mar 31, 1946 Today's Date: 10/13/2017    History of Present Illness 72 yo male s/p revision of acetabular component of previous L THA 10/09/17. Hx of chronic L foot drop.     PT Comments    Pt continues to participate well. He recalled hip precautions and PWB status. Reviewed gait training and stair training. Pt did not feel the need to practice stair negotiation again on today. Discussed car transfer. All education completed. Okay to d/c from PT standpoint-RN aware.     Follow Up Recommendations  Home health PT     Equipment Recommendations  None recommended by PT    Recommendations for Other Services       Precautions / Restrictions Precautions Precautions: Anterior Hip Precaution Comments: Pt recalls all anterior THP Restrictions Weight Bearing Restrictions: Yes LLE Weight Bearing: Partial weight bearing LLE Partial Weight Bearing Percentage or Pounds: 50%    Mobility  Bed Mobility Overal bed mobility: Needs Assistance Bed Mobility: Supine to Sit     Supine to sit: Min assist;HOB elevated     General bed mobility comments: small amount of assist for L LE initially. Cues for adherence to precautions.   Transfers Overall transfer level: Needs assistance Equipment used: Rolling walker (2 wheeled) Transfers: Sit to/from Stand Sit to Stand: Supervision         General transfer comment: cues for safety.   Ambulation/Gait Ambulation/Gait assistance: Supervision Ambulation Distance (Feet): 115 Feet Assistive device: Rolling walker (2 wheeled) Gait Pattern/deviations: Step-to pattern     General Gait Details: cues for posture, PWB adherence.    Stairs Stairs: (did not perform. Pt felt comfortable from prior practice )          Wheelchair Mobility    Modified Rankin (Stroke Patients Only)       Balance                                             Cognition Arousal/Alertness: Awake/alert Behavior During Therapy: WFL for tasks assessed/performed Overall Cognitive Status: Within Functional Limits for tasks assessed                                        Exercises Total Joint Exercises Ankle Circles/Pumps: AROM;Supine;Right;10 reps Quad Sets: AROM;Both;Supine;10 reps Heel Slides: AAROM;Left;Supine;10 reps    General Comments        Pertinent Vitals/Pain Pain Assessment: 0-10 Pain Score: 4  Pain Location: L hip Pain Descriptors / Indicators: Sore Pain Intervention(s): Monitored during session;Repositioned    Home Living                      Prior Function            PT Goals (current goals can now be found in the care plan section) Progress towards PT goals: Progressing toward goals    Frequency    7X/week      PT Plan Current plan remains appropriate    Co-evaluation              AM-PAC PT "6 Clicks" Daily Activity  Outcome Measure  Difficulty turning over in bed (including adjusting bedclothes, sheets and blankets)?: A Little Difficulty moving from lying on back to sitting  on the side of the bed? : A Lot Difficulty sitting down on and standing up from a chair with arms (e.g., wheelchair, bedside commode, etc,.)?: A Little Help needed moving to and from a bed to chair (including a wheelchair)?: A Little Help needed walking in hospital room?: A Little Help needed climbing 3-5 steps with a railing? : A Little 6 Click Score: 17    End of Session   Activity Tolerance: Patient tolerated treatment well Patient left: in chair;with call bell/phone within reach;with family/visitor present   PT Visit Diagnosis: Muscle weakness (generalized) (M62.81);Difficulty in walking, not elsewhere classified (R26.2);Pain Pain - Right/Left: Left Pain - part of body: Leg     Time: 8592-9244 PT Time Calculation (min) (ACUTE ONLY): 33 min  Charges:  $Gait Training: 8-22  mins $Therapeutic Exercise: 8-22 mins                    G Codes:         Weston Anna, MPT Pager: [phone]

## 2017-10-13 NOTE — Telephone Encounter (Signed)
IC s/w Eddie, [id number], pending, Vennie Waymire take up to 24 hours, marked urgent.  Eddie will contact pharm team to advise urgent, to have them try to go ahead and review.  I will call to f/u on this as well, then advise Pam.

## 2017-10-13 NOTE — Progress Notes (Signed)
Patient d/ced home with wife

## 2017-10-13 NOTE — Telephone Encounter (Signed)
Please advise 

## 2017-10-13 NOTE — Progress Notes (Signed)
Patient ID: [name], male   DOB: 05/07/1946, 72 y.o.   MRN: [id number] Doing well overall.  Vitals stable.  PICC line in place.  Left hip incision looks good.  Can be discharged home today on 6 weeks of IV ancef - 2 gm every 8 hours.

## 2017-10-13 NOTE — Telephone Encounter (Signed)
Thank you for helping me

## 2017-10-13 NOTE — Telephone Encounter (Signed)
[name] is with [facility].  She is calling to ask again what the end date for the ancef is? ALSO- PRIOR AUTH needed for the ancef, call [phone].  This drug is on national backorder and you need to specify that you need the 100 gram vial, [name] [phone]. This is urgent, and patient has already d/c, and they need this ASAP, so they can take the meds to patient around 4pm.

## 2017-10-14 ENCOUNTER — Other Ambulatory Visit (INDEPENDENT_AMBULATORY_CARE_PROVIDER_SITE_OTHER): Payer: Self-pay

## 2017-10-14 ENCOUNTER — Telehealth (INDEPENDENT_AMBULATORY_CARE_PROVIDER_SITE_OTHER): Payer: Self-pay | Admitting: Orthopaedic Surgery

## 2017-10-14 ENCOUNTER — Encounter: Payer: Self-pay | Admitting: Family Medicine

## 2017-10-14 DIAGNOSIS — Z96642 Presence of left artificial hip joint: Secondary | ICD-10-CM | POA: Diagnosis not present

## 2017-10-14 DIAGNOSIS — G894 Chronic pain syndrome: Secondary | ICD-10-CM | POA: Diagnosis not present

## 2017-10-14 DIAGNOSIS — Z5181 Encounter for therapeutic drug level monitoring: Secondary | ICD-10-CM | POA: Diagnosis not present

## 2017-10-14 DIAGNOSIS — Z792 Long term (current) use of antibiotics: Secondary | ICD-10-CM | POA: Diagnosis not present

## 2017-10-14 DIAGNOSIS — N182 Chronic kidney disease, stage 2 (mild): Secondary | ICD-10-CM | POA: Diagnosis not present

## 2017-10-14 DIAGNOSIS — Z7901 Long term (current) use of anticoagulants: Secondary | ICD-10-CM | POA: Diagnosis not present

## 2017-10-14 DIAGNOSIS — M5186 Other intervertebral disc disorders, lumbar region: Secondary | ICD-10-CM | POA: Diagnosis not present

## 2017-10-14 DIAGNOSIS — Z8546 Personal history of malignant neoplasm of prostate: Secondary | ICD-10-CM | POA: Diagnosis not present

## 2017-10-14 DIAGNOSIS — T8452XA Infection and inflammatory reaction due to internal left hip prosthesis, initial encounter: Secondary | ICD-10-CM | POA: Diagnosis not present

## 2017-10-14 DIAGNOSIS — Z79891 Long term (current) use of opiate analgesic: Secondary | ICD-10-CM | POA: Diagnosis not present

## 2017-10-14 DIAGNOSIS — N3941 Urge incontinence: Secondary | ICD-10-CM | POA: Diagnosis not present

## 2017-10-14 DIAGNOSIS — I872 Venous insufficiency (chronic) (peripheral): Secondary | ICD-10-CM | POA: Diagnosis not present

## 2017-10-14 DIAGNOSIS — M00852 Arthritis due to other bacteria, left hip: Secondary | ICD-10-CM | POA: Diagnosis not present

## 2017-10-14 DIAGNOSIS — Z452 Encounter for adjustment and management of vascular access device: Secondary | ICD-10-CM | POA: Diagnosis not present

## 2017-10-14 DIAGNOSIS — Z86718 Personal history of other venous thrombosis and embolism: Secondary | ICD-10-CM | POA: Diagnosis not present

## 2017-10-14 LAB — AEROBIC/ANAEROBIC CULTURE (SURGICAL/DEEP WOUND)

## 2017-10-14 LAB — AEROBIC/ANAEROBIC CULTURE W GRAM STAIN (SURGICAL/DEEP WOUND)

## 2017-10-14 MED ORDER — RIVAROXABAN 10 MG PO TABS: 10.0000 mg | ORAL_TABLET | Freq: Every day | ORAL | 0 refills | Status: DC

## 2017-10-14 NOTE — Telephone Encounter (Signed)
Apparently he didn't get sent home with a Rx xarelto Can you tell me dose and how to take it

## 2017-10-14 NOTE — Telephone Encounter (Signed)
Please advise 

## 2017-10-14 NOTE — Telephone Encounter (Signed)
No aspirin while on Xarelto

## 2017-10-14 NOTE — Telephone Encounter (Signed)
Called into pharmacy

## 2017-10-14 NOTE — Telephone Encounter (Signed)
Cherryl (nurse) with [facility] called needing clarification that Dr. Ninfa Linden does not want the patient on Aspirin or Xarelto. The number to contact Cherryl is [phone]

## 2017-10-14 NOTE — Telephone Encounter (Signed)
I thought I escribed it.  Xarelto 10 mg daily, #15

## 2017-10-16 DIAGNOSIS — Z452 Encounter for adjustment and management of vascular access device: Secondary | ICD-10-CM | POA: Diagnosis not present

## 2017-10-16 DIAGNOSIS — Z792 Long term (current) use of antibiotics: Secondary | ICD-10-CM | POA: Diagnosis not present

## 2017-10-16 DIAGNOSIS — M00852 Arthritis due to other bacteria, left hip: Secondary | ICD-10-CM | POA: Diagnosis not present

## 2017-10-16 DIAGNOSIS — Z5181 Encounter for therapeutic drug level monitoring: Secondary | ICD-10-CM | POA: Diagnosis not present

## 2017-10-16 DIAGNOSIS — Z96642 Presence of left artificial hip joint: Secondary | ICD-10-CM | POA: Diagnosis not present

## 2017-10-16 DIAGNOSIS — Z8546 Personal history of malignant neoplasm of prostate: Secondary | ICD-10-CM | POA: Diagnosis not present

## 2017-10-16 DIAGNOSIS — T8452XA Infection and inflammatory reaction due to internal left hip prosthesis, initial encounter: Secondary | ICD-10-CM | POA: Diagnosis not present

## 2017-10-20 DIAGNOSIS — N182 Chronic kidney disease, stage 2 (mild): Secondary | ICD-10-CM | POA: Diagnosis not present

## 2017-10-20 DIAGNOSIS — Z96642 Presence of left artificial hip joint: Secondary | ICD-10-CM | POA: Diagnosis not present

## 2017-10-20 DIAGNOSIS — Z792 Long term (current) use of antibiotics: Secondary | ICD-10-CM | POA: Diagnosis not present

## 2017-10-20 DIAGNOSIS — Z5181 Encounter for therapeutic drug level monitoring: Secondary | ICD-10-CM | POA: Diagnosis not present

## 2017-10-20 DIAGNOSIS — Z452 Encounter for adjustment and management of vascular access device: Secondary | ICD-10-CM | POA: Diagnosis not present

## 2017-10-20 DIAGNOSIS — Z8546 Personal history of malignant neoplasm of prostate: Secondary | ICD-10-CM | POA: Diagnosis not present

## 2017-10-20 DIAGNOSIS — M00852 Arthritis due to other bacteria, left hip: Secondary | ICD-10-CM | POA: Diagnosis not present

## 2017-10-20 DIAGNOSIS — T8452XA Infection and inflammatory reaction due to internal left hip prosthesis, initial encounter: Secondary | ICD-10-CM | POA: Diagnosis not present

## 2017-10-21 ENCOUNTER — Telehealth (INDEPENDENT_AMBULATORY_CARE_PROVIDER_SITE_OTHER): Payer: Self-pay | Admitting: Orthopaedic Surgery

## 2017-10-21 NOTE — Telephone Encounter (Signed)
FYI

## 2017-10-21 NOTE — Telephone Encounter (Signed)
Barney Drain, PT with [facility] was calling to let Dr. Ninfa Linden know she is changing the POC for patient but not adding any additional visits. She stated patient wanted to "push back a little bit".

## 2017-10-22 DIAGNOSIS — Z8546 Personal history of malignant neoplasm of prostate: Secondary | ICD-10-CM | POA: Diagnosis not present

## 2017-10-22 DIAGNOSIS — Z792 Long term (current) use of antibiotics: Secondary | ICD-10-CM | POA: Diagnosis not present

## 2017-10-22 DIAGNOSIS — T8452XA Infection and inflammatory reaction due to internal left hip prosthesis, initial encounter: Secondary | ICD-10-CM | POA: Diagnosis not present

## 2017-10-22 DIAGNOSIS — Z96642 Presence of left artificial hip joint: Secondary | ICD-10-CM | POA: Diagnosis not present

## 2017-10-22 DIAGNOSIS — M00852 Arthritis due to other bacteria, left hip: Secondary | ICD-10-CM | POA: Diagnosis not present

## 2017-10-22 DIAGNOSIS — Z5181 Encounter for therapeutic drug level monitoring: Secondary | ICD-10-CM | POA: Diagnosis not present

## 2017-10-22 DIAGNOSIS — Z452 Encounter for adjustment and management of vascular access device: Secondary | ICD-10-CM | POA: Diagnosis not present

## 2017-10-27 ENCOUNTER — Encounter (INDEPENDENT_AMBULATORY_CARE_PROVIDER_SITE_OTHER): Payer: Self-pay | Admitting: Orthopaedic Surgery

## 2017-10-27 ENCOUNTER — Ambulatory Visit (INDEPENDENT_AMBULATORY_CARE_PROVIDER_SITE_OTHER): Payer: PPO | Admitting: Orthopaedic Surgery

## 2017-10-27 ENCOUNTER — Ambulatory Visit (INDEPENDENT_AMBULATORY_CARE_PROVIDER_SITE_OTHER): Payer: PPO

## 2017-10-27 DIAGNOSIS — Z452 Encounter for adjustment and management of vascular access device: Secondary | ICD-10-CM | POA: Diagnosis not present

## 2017-10-27 DIAGNOSIS — Z5181 Encounter for therapeutic drug level monitoring: Secondary | ICD-10-CM | POA: Diagnosis not present

## 2017-10-27 DIAGNOSIS — M00852 Arthritis due to other bacteria, left hip: Secondary | ICD-10-CM | POA: Diagnosis not present

## 2017-10-27 DIAGNOSIS — Z792 Long term (current) use of antibiotics: Secondary | ICD-10-CM | POA: Diagnosis not present

## 2017-10-27 DIAGNOSIS — Z8546 Personal history of malignant neoplasm of prostate: Secondary | ICD-10-CM | POA: Diagnosis not present

## 2017-10-27 DIAGNOSIS — Z96649 Presence of unspecified artificial hip joint: Secondary | ICD-10-CM

## 2017-10-27 DIAGNOSIS — Z96642 Presence of left artificial hip joint: Secondary | ICD-10-CM | POA: Diagnosis not present

## 2017-10-27 DIAGNOSIS — T8452XA Infection and inflammatory reaction due to internal left hip prosthesis, initial encounter: Secondary | ICD-10-CM | POA: Diagnosis not present

## 2017-10-27 NOTE — Progress Notes (Signed)
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  His cultures did show a sensitive staph infection with MSSA.  He is on IV antibiotics for this.  He is in 2 weeks of 6 weeks of total IV antibiotics.  He understands to be on at least a year to a lifetime of oral antibiotics after this.  He is feeling well overall.  With that of just 50% weightbearing.  On exam his incision looks good.  I removed all staples apply Steri-Strips.  I did drain at least 60 cc of fluid from around the hip tissue.  He is on Xarelto.  At this point I will have him stop Xarelto and go back to baby aspirin.  We will see him back in 4 weeks to see how he is doing overall.  I would like an AP and lateral of his left hip at that visit and we do not need to see the pelvis.  This is just an AP and lateral left hip.  He will continue the walker for 2 more weeks and then can transition to a cane in his opposite hand.  All questions concerns were answered and addressed.

## 2017-10-29 ENCOUNTER — Telehealth (INDEPENDENT_AMBULATORY_CARE_PROVIDER_SITE_OTHER): Payer: Self-pay | Admitting: Orthopaedic Surgery

## 2017-10-29 NOTE — Telephone Encounter (Signed)
[name] with [facility] wants to let Dr. Ninfa Linden know that patient is doing well but as long as he is on antibiotics? he wanted to draw out the visits so she will be changing the placement of the next visits.

## 2017-11-03 DIAGNOSIS — Z452 Encounter for adjustment and management of vascular access device: Secondary | ICD-10-CM | POA: Diagnosis not present

## 2017-11-03 DIAGNOSIS — T8452XA Infection and inflammatory reaction due to internal left hip prosthesis, initial encounter: Secondary | ICD-10-CM | POA: Diagnosis not present

## 2017-11-03 DIAGNOSIS — Z96642 Presence of left artificial hip joint: Secondary | ICD-10-CM | POA: Diagnosis not present

## 2017-11-03 DIAGNOSIS — Z792 Long term (current) use of antibiotics: Secondary | ICD-10-CM | POA: Diagnosis not present

## 2017-11-03 DIAGNOSIS — Z0000 Encounter for general adult medical examination without abnormal findings: Secondary | ICD-10-CM | POA: Diagnosis not present

## 2017-11-03 DIAGNOSIS — M00852 Arthritis due to other bacteria, left hip: Secondary | ICD-10-CM | POA: Diagnosis not present

## 2017-11-03 DIAGNOSIS — Z8546 Personal history of malignant neoplasm of prostate: Secondary | ICD-10-CM | POA: Diagnosis not present

## 2017-11-03 DIAGNOSIS — Z5181 Encounter for therapeutic drug level monitoring: Secondary | ICD-10-CM | POA: Diagnosis not present

## 2017-11-07 DIAGNOSIS — Z8546 Personal history of malignant neoplasm of prostate: Secondary | ICD-10-CM | POA: Diagnosis not present

## 2017-11-07 DIAGNOSIS — N3941 Urge incontinence: Secondary | ICD-10-CM | POA: Diagnosis not present

## 2017-11-10 ENCOUNTER — Encounter: Payer: Self-pay | Admitting: Family Medicine

## 2017-11-10 DIAGNOSIS — Z5181 Encounter for therapeutic drug level monitoring: Secondary | ICD-10-CM | POA: Diagnosis not present

## 2017-11-10 DIAGNOSIS — Z452 Encounter for adjustment and management of vascular access device: Secondary | ICD-10-CM | POA: Diagnosis not present

## 2017-11-10 DIAGNOSIS — Z8546 Personal history of malignant neoplasm of prostate: Secondary | ICD-10-CM | POA: Diagnosis not present

## 2017-11-10 DIAGNOSIS — Z96642 Presence of left artificial hip joint: Secondary | ICD-10-CM | POA: Diagnosis not present

## 2017-11-10 DIAGNOSIS — Z7689 Persons encountering health services in other specified circumstances: Secondary | ICD-10-CM | POA: Diagnosis not present

## 2017-11-10 DIAGNOSIS — M00852 Arthritis due to other bacteria, left hip: Secondary | ICD-10-CM | POA: Diagnosis not present

## 2017-11-10 DIAGNOSIS — T8452XA Infection and inflammatory reaction due to internal left hip prosthesis, initial encounter: Secondary | ICD-10-CM | POA: Diagnosis not present

## 2017-11-10 DIAGNOSIS — Z792 Long term (current) use of antibiotics: Secondary | ICD-10-CM | POA: Diagnosis not present

## 2017-11-10 LAB — LAB REPORT - SCANNED: PSA, Total: 0.18

## 2017-11-17 ENCOUNTER — Encounter: Payer: Self-pay | Admitting: Family Medicine

## 2017-11-17 DIAGNOSIS — I872 Venous insufficiency (chronic) (peripheral): Secondary | ICD-10-CM | POA: Diagnosis not present

## 2017-11-17 DIAGNOSIS — Z96642 Presence of left artificial hip joint: Secondary | ICD-10-CM | POA: Diagnosis not present

## 2017-11-17 DIAGNOSIS — Z5181 Encounter for therapeutic drug level monitoring: Secondary | ICD-10-CM | POA: Diagnosis not present

## 2017-11-17 DIAGNOSIS — Z7901 Long term (current) use of anticoagulants: Secondary | ICD-10-CM | POA: Diagnosis not present

## 2017-11-17 DIAGNOSIS — Z86718 Personal history of other venous thrombosis and embolism: Secondary | ICD-10-CM | POA: Diagnosis not present

## 2017-11-17 DIAGNOSIS — M00852 Arthritis due to other bacteria, left hip: Secondary | ICD-10-CM | POA: Diagnosis not present

## 2017-11-17 DIAGNOSIS — N182 Chronic kidney disease, stage 2 (mild): Secondary | ICD-10-CM | POA: Diagnosis not present

## 2017-11-17 DIAGNOSIS — G894 Chronic pain syndrome: Secondary | ICD-10-CM | POA: Diagnosis not present

## 2017-11-17 DIAGNOSIS — Z452 Encounter for adjustment and management of vascular access device: Secondary | ICD-10-CM | POA: Diagnosis not present

## 2017-11-17 DIAGNOSIS — Z8546 Personal history of malignant neoplasm of prostate: Secondary | ICD-10-CM | POA: Diagnosis not present

## 2017-11-17 DIAGNOSIS — Z792 Long term (current) use of antibiotics: Secondary | ICD-10-CM | POA: Diagnosis not present

## 2017-11-17 DIAGNOSIS — M5186 Other intervertebral disc disorders, lumbar region: Secondary | ICD-10-CM | POA: Diagnosis not present

## 2017-11-17 DIAGNOSIS — T8452XA Infection and inflammatory reaction due to internal left hip prosthesis, initial encounter: Secondary | ICD-10-CM | POA: Diagnosis not present

## 2017-11-17 DIAGNOSIS — N3941 Urge incontinence: Secondary | ICD-10-CM | POA: Diagnosis not present

## 2017-11-17 DIAGNOSIS — Z79891 Long term (current) use of opiate analgesic: Secondary | ICD-10-CM | POA: Diagnosis not present

## 2017-11-19 ENCOUNTER — Other Ambulatory Visit (INDEPENDENT_AMBULATORY_CARE_PROVIDER_SITE_OTHER): Payer: Self-pay

## 2017-11-19 ENCOUNTER — Telehealth (INDEPENDENT_AMBULATORY_CARE_PROVIDER_SITE_OTHER): Payer: Self-pay | Admitting: Orthopaedic Surgery

## 2017-11-19 MED ORDER — DOXYCYCLINE HYCLATE 100 MG PO CAPS: 100.0000 mg | ORAL_CAPSULE | Freq: Two times a day (BID) | ORAL | 6 refills | Status: DC

## 2017-11-19 NOTE — Telephone Encounter (Signed)
We can give an order to stop the IV antibiotics after tonight's dose and they can pull the PICC line.  We need to make sure that he is on doxycycline 100 mg orally twice daily.  Please send it into his pharmacy with #60 and 6 refills.

## 2017-11-19 NOTE — Telephone Encounter (Signed)
See below

## 2017-11-19 NOTE — Telephone Encounter (Signed)
Patient aware Rx ready at pharmacy [facility] aware of the below message from [facility]

## 2017-11-19 NOTE — Telephone Encounter (Signed)
Amanda with [facility] called advised patient will be finishing his IV antibiotics tonight. Estill Bamberg wanted to see if that will be the last of the antibiotics and if Dr. Ninfa Linden wants her to DC his Picc line. Estill Bamberg need to get orders and clarification for that. The number to contact Estill Bamberg is [phone]

## 2017-11-25 ENCOUNTER — Encounter: Payer: Self-pay | Admitting: Family Medicine

## 2017-11-27 ENCOUNTER — Encounter (INDEPENDENT_AMBULATORY_CARE_PROVIDER_SITE_OTHER): Payer: Self-pay | Admitting: Orthopaedic Surgery

## 2017-11-27 ENCOUNTER — Ambulatory Visit (INDEPENDENT_AMBULATORY_CARE_PROVIDER_SITE_OTHER): Payer: PPO | Admitting: Orthopaedic Surgery

## 2017-11-27 ENCOUNTER — Ambulatory Visit (INDEPENDENT_AMBULATORY_CARE_PROVIDER_SITE_OTHER): Payer: PPO

## 2017-11-27 DIAGNOSIS — Z96642 Presence of left artificial hip joint: Secondary | ICD-10-CM

## 2017-11-27 NOTE — Progress Notes (Signed)
The patient is now about 7 weeks status post revision arthroplasty of the left acetabular component.  He did have residual sensitive staph infection.  He is been on 6 weeks of IV antibiotics and now is on oral doxycycline.  He is ambulate with a cane.  He says he is doing better overall would like to have outpatient physical therapy to work on his balance and coordination as well as gait training.  I did give him a prescription for this.  He said he is doing well otherwise.  I did put his right hip and left hip to internal and external rotation.  They seem to move well in his left operative hip is not significantly painful to him.  X-ray of the pelvis and left hip shows a total hip arthroplasty with a revision acetabular component that I compared to previous films and it appears to be stable.  At this point I let him know that I would like to have more oral antibiotics for at least a period of a year.  We will send him to outpatient physical therapy and I would like to see him back in 6 weeks to see how is doing overall.  At that visit I would like an AP pelvis and a lateral of his left hip but I would like these both done with him supine and not standing.

## 2017-12-02 ENCOUNTER — Telehealth (INDEPENDENT_AMBULATORY_CARE_PROVIDER_SITE_OTHER): Payer: Self-pay | Admitting: Orthopaedic Surgery

## 2017-12-02 DIAGNOSIS — M1612 Unilateral primary osteoarthritis, left hip: Secondary | ICD-10-CM | POA: Diagnosis not present

## 2017-12-02 NOTE — Telephone Encounter (Signed)
10/09/2017 OP note faxed to [facility] [phone], Attn: Pamala Hurry

## 2017-12-04 DIAGNOSIS — M1612 Unilateral primary osteoarthritis, left hip: Secondary | ICD-10-CM | POA: Diagnosis not present

## 2017-12-07 ENCOUNTER — Encounter: Payer: Self-pay | Admitting: Family Medicine

## 2017-12-09 DIAGNOSIS — M1612 Unilateral primary osteoarthritis, left hip: Secondary | ICD-10-CM | POA: Diagnosis not present

## 2017-12-11 DIAGNOSIS — M1612 Unilateral primary osteoarthritis, left hip: Secondary | ICD-10-CM | POA: Diagnosis not present

## 2017-12-16 DIAGNOSIS — M1612 Unilateral primary osteoarthritis, left hip: Secondary | ICD-10-CM | POA: Diagnosis not present

## 2017-12-18 DIAGNOSIS — M1612 Unilateral primary osteoarthritis, left hip: Secondary | ICD-10-CM | POA: Diagnosis not present

## 2017-12-23 DIAGNOSIS — M1612 Unilateral primary osteoarthritis, left hip: Secondary | ICD-10-CM | POA: Diagnosis not present

## 2017-12-25 DIAGNOSIS — M1612 Unilateral primary osteoarthritis, left hip: Secondary | ICD-10-CM | POA: Diagnosis not present

## 2017-12-30 DIAGNOSIS — M1612 Unilateral primary osteoarthritis, left hip: Secondary | ICD-10-CM | POA: Diagnosis not present

## 2018-01-02 DIAGNOSIS — M1612 Unilateral primary osteoarthritis, left hip: Secondary | ICD-10-CM | POA: Diagnosis not present

## 2018-01-06 DIAGNOSIS — M1612 Unilateral primary osteoarthritis, left hip: Secondary | ICD-10-CM | POA: Diagnosis not present

## 2018-01-08 ENCOUNTER — Ambulatory Visit (INDEPENDENT_AMBULATORY_CARE_PROVIDER_SITE_OTHER): Payer: PPO

## 2018-01-08 ENCOUNTER — Ambulatory Visit (INDEPENDENT_AMBULATORY_CARE_PROVIDER_SITE_OTHER): Payer: PPO | Admitting: Orthopaedic Surgery

## 2018-01-08 ENCOUNTER — Encounter (INDEPENDENT_AMBULATORY_CARE_PROVIDER_SITE_OTHER): Payer: Self-pay | Admitting: Orthopaedic Surgery

## 2018-01-08 DIAGNOSIS — Z96649 Presence of unspecified artificial hip joint: Secondary | ICD-10-CM

## 2018-01-08 DIAGNOSIS — M1612 Unilateral primary osteoarthritis, left hip: Secondary | ICD-10-CM | POA: Diagnosis not present

## 2018-01-08 NOTE — Progress Notes (Signed)
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  There is residual infection as well.  He understands only keep him on doxycycline for at least a year postoperative.  He is doing well overall.  He is been going to physical therapy says that helped a lot.  He is also had spine issues.  He is ambulate with a cane but he says he has no issues with pain in his hip other than at the incision site.  It is been no drainage either.  His leg lengths are close to equal as well.  On exam he tolerates me easily putting his right hip and left hip the range of motion.  The left operative hip seems to be moving normally without any pain.  An AP pelvis and lateral left hip were reviewed and compared to previous films and I see no evidence of hardware failure or loosening or subsidence.  At this point we do not need to see him back for 6 months unless there is any issues.  At that visit I would like a low AP pelvis and lateral of his left hip.

## 2018-01-13 DIAGNOSIS — M1612 Unilateral primary osteoarthritis, left hip: Secondary | ICD-10-CM | POA: Diagnosis not present

## 2018-01-20 DIAGNOSIS — M1612 Unilateral primary osteoarthritis, left hip: Secondary | ICD-10-CM | POA: Diagnosis not present

## 2018-01-22 DIAGNOSIS — M1612 Unilateral primary osteoarthritis, left hip: Secondary | ICD-10-CM | POA: Diagnosis not present

## 2018-01-30 DIAGNOSIS — M1612 Unilateral primary osteoarthritis, left hip: Secondary | ICD-10-CM | POA: Diagnosis not present

## 2018-02-03 DIAGNOSIS — M1612 Unilateral primary osteoarthritis, left hip: Secondary | ICD-10-CM | POA: Diagnosis not present

## 2018-02-05 DIAGNOSIS — M1612 Unilateral primary osteoarthritis, left hip: Secondary | ICD-10-CM | POA: Diagnosis not present

## 2018-02-10 DIAGNOSIS — M1612 Unilateral primary osteoarthritis, left hip: Secondary | ICD-10-CM | POA: Diagnosis not present

## 2018-02-12 ENCOUNTER — Encounter: Payer: Self-pay | Admitting: Family Medicine

## 2018-02-12 ENCOUNTER — Telehealth: Payer: Self-pay

## 2018-02-12 ENCOUNTER — Ambulatory Visit (INDEPENDENT_AMBULATORY_CARE_PROVIDER_SITE_OTHER): Payer: PPO | Admitting: Family Medicine

## 2018-02-12 VITALS — BP 146/74 | HR 59 | Temp 97.6°F | Resp 16 | Ht 73.5 in | Wt 264.0 lb

## 2018-02-12 DIAGNOSIS — M1612 Unilateral primary osteoarthritis, left hip: Secondary | ICD-10-CM | POA: Diagnosis not present

## 2018-02-12 DIAGNOSIS — Z0000 Encounter for general adult medical examination without abnormal findings: Secondary | ICD-10-CM

## 2018-02-12 DIAGNOSIS — E669 Obesity, unspecified: Secondary | ICD-10-CM

## 2018-02-12 DIAGNOSIS — Z8546 Personal history of malignant neoplasm of prostate: Secondary | ICD-10-CM | POA: Diagnosis not present

## 2018-02-12 LAB — COMPREHENSIVE METABOLIC PANEL
ALBUMIN: 3.9 g/dL (ref 3.5–5.2)
ALT: 12 U/L (ref 0–53)
AST: 16 U/L (ref 0–37)
Alkaline Phosphatase: 95 U/L (ref 39–117)
BILIRUBIN TOTAL: 0.4 mg/dL (ref 0.2–1.2)
BUN: 19 mg/dL (ref 6–23)
CALCIUM: 10.1 mg/dL (ref 8.4–10.5)
CO2: 27 mEq/L (ref 19–32)
CREATININE: 1.06 mg/dL (ref 0.40–1.50)
Chloride: 105 mEq/L (ref 96–112)
GFR: 72.77 mL/min (ref >60.00)
Glucose, Bld: 94 mg/dL (ref 70–99)
Potassium: 4.5 mEq/L (ref 3.5–5.1)
SODIUM: 137 meq/L (ref 135–145)
Total Protein: 6.9 g/dL (ref 6.0–8.3)

## 2018-02-12 LAB — LIPID PANEL
CHOLESTEROL: 181 mg/dL (ref 0–200)
HDL: 38.2 mg/dL — ABNORMAL LOW (ref >39.00)
LDL CALC: 114 mg/dL — AB (ref 0–99)
NonHDL: 142.31
TRIGLYCERIDES: 143 mg/dL (ref 0.0–149.0)
Total CHOL/HDL Ratio: 5
VLDL: 28.6 mg/dL (ref 0.0–40.0)

## 2018-02-12 LAB — CBC WITH DIFFERENTIAL/PLATELET
Basophils Absolute: 0 10*3/uL (ref 0.0–0.1)
Basophils Relative: 0.8 % (ref 0.0–3.0)
EOS ABS: 0.2 10*3/uL (ref 0.0–0.7)
Eosinophils Relative: 2.9 % (ref 0.0–5.0)
HEMATOCRIT: 47.5 % (ref 39.0–52.0)
Hemoglobin: 15.3 g/dL (ref 13.0–17.0)
LYMPHS PCT: 39.4 % (ref 12.0–46.0)
Lymphs Abs: 2.2 10*3/uL (ref 0.7–4.0)
MCHC: 32.1 g/dL (ref 30.0–36.0)
MCV: 80.1 fl (ref 78.0–100.0)
MONO ABS: 0.6 10*3/uL (ref 0.1–1.0)
MONOS PCT: 11.1 % (ref 3.0–12.0)
Neutro Abs: 2.5 10*3/uL (ref 1.4–7.7)
Neutrophils Relative %: 45.8 % (ref 43.0–77.0)
Platelets: 258 10*3/uL (ref 150.0–400.0)
RBC: 5.94 Mil/uL — ABNORMAL HIGH (ref 4.22–5.81)
RDW: 18.1 % — ABNORMAL HIGH (ref 11.5–15.5)
WBC: 5.5 10*3/uL (ref 4.0–10.5)

## 2018-02-12 LAB — PSA: PSA: 0.43 ng/mL (ref 0.10–4.00)

## 2018-02-12 MED ORDER — ZOSTER VAC RECOMB ADJUVANTED 50 MCG/0.5ML IM SUSR: 0.5000 mL | Freq: Once | INTRAMUSCULAR | 1 refills | Status: AC

## 2018-02-12 NOTE — Telephone Encounter (Signed)
Pt declines AWV per Diane Tomerlin.

## 2018-02-12 NOTE — Progress Notes (Signed)
Office Note 02/12/2018  CC:  Chief Complaint  Patient presents with  . Annual Exam    Pt is fasting.    HPI:  [name] is a 72 y.o. White male who is here for annual health maintenance exam.  Most recent surgery was L hip acetabular revision January this year.  Had mild post-op anemia, need to recheck CBC today.  Doing really well now, last rehab session is TODAY.  No Pain!  I took him off of xarelto 07/2017 (see PMH section below for details).  Exercise: active in yard and around house--not sedentary. Diet: "fair"   HOME BP when he was getting this checked twice a week while on home IV abx---always 120s/60s per pt.  Past Medical History:  Diagnosis Date  . Anterior dislocation of right shoulder 09/2013   Reduced in ED under sedation.  Dislocated posteriorly 2015, ? partial RC tear, has f/u planned with Dr. Marlou Hughes--? surgery? possible plan for 2016.  [name] Blood donor, whole blood    Has donated approx 30 gallons in his lifetime  . 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  CT hip 08/2017 w/acetabular erosion--findings worrisome for particle disease vs infection--ortho checking labs and possibly doing jt aspiration for G stain/culture---needs hip surgery either way..  . Chronic left hip pain    Severe DJD.  THA 2008.  Left acetabular revision in 2017 AND 2019.  [name] Chronic pain syndrome    Candidate for spinal cord stimulator (03/2017)  . Chronic renal insufficiency, stage II (mild)   . Chronic venous insufficiency    +compression hose  . DDD (degenerative disc disease), lumbar 2017   Dr. Noe Hughes ortho.  He is now wearing a new leg brace.  Has had ESI and will get another.  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  Xarelto x 63mo at full dosing, then 6 more  months at 1/2 dosing, then stopped xarelto.    . Foot drop, left    s/p hip fracture 1969  . 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  As of urol f/u 08/2014, plan is for me to follow PSAs and fax results to Dr. Junious Hughes: PSA 02/2015 was 0.21 (stable).  Stable on f/u with Dr. Junious Hughes 08/2015, 11/2016.  [name] Hx of adenomatous colonic polyps    polypectomy 2007; 2012; 2015 (+adenomatous, no high grade dysplasia); recall 5 yrs  . Hypercholesterolemia 02/2017   Recommended statin 02/10/17 but pt declined, wanted to do TLC trial first.  . Osteoarthritis    knees and hips primarily  . 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  . Toe infection 05/27/2013   Left great toe and right 2nd toe  . Trochanteric bursitis of left hip    Steroid injection by ortho 09/10/16  . Urge incontinence    vesicare helpful    Past Surgical History:  Procedure Laterality Date  . ANTERIOR HIP REVISION Left 06/07/2016   Procedure: LEFT ANTERIOR HIP ACETABULAR REVISION;  Surgeon: Joseph Rossetti, MD;  Location: [facility];  Service: Orthopedics;  Laterality: Left;  . ANTERIOR HIP REVISION Left 10/09/2017   Procedure: Left hip acetabular revision;  Surgeon: Joseph Rossetti, MD;  Location: [facility];  Service: Orthopedics;  Laterality: Left;  . arthroscopy left shoulder Left 07  . bilateral carpal tunnel release  2003  . BILATERAL CARPAL TUNNEL RELEASE    . COLONOSCOPY  2007;2012;2015   04/12/2014 Tubular adenoma x 1; per Dr. Henrene Hughes recall 5 yrs  . FRACTURE SURGERY Left   . JOINT REPLACEMENT Left    hip  . LUMBAR LAMINECTOMY/DECOMPRESSION MICRODISCECTOMY Left 03/28/2016   Procedure: Left Lumbar three-four Laminoforaminotomy;  Surgeon: Erline Levine, MD;  Location: [facility];  Service:  Neurosurgery;  Laterality: Left;  left  . RADIOACTIVE SEED IMPLANT  2012  . ROTATOR CUFF REPAIR  2006   right  . seed implant prostate  2012  . SHOULDER ARTHROSCOPY Left   . SPINAL CORD STIMULATOR IMPLANT  06/2017   Very helpful  . TOTAL HIP ARTHROPLASTY  2008   left ([place])  . VASECTOMY      Family History  Problem Relation Age of Onset  . Lung cancer Mother   . Heart disease Father     Social History   Socioeconomic History  . Marital status: Married    Spouse name: Not on file  . Number of children: Not on file  . Years of education: Not on file  . Highest education level: Not on file  Occupational History  . Not on file  Social Needs  . Financial resource strain: Not on file  . Food insecurity:    Worry: Not on file    Inability: Not on file  . Transportation needs:    Medical: Not on file    Non-medical: Not on file  Tobacco Use  . Smoking status: Never Smoker  . Smokeless tobacco: Never Used  Substance and Sexual Activity  . Alcohol use: No  . Drug use: No  . Sexual activity: Not on file  Lifestyle  . Physical activity:    Days per week: 3 days    Minutes per session: 120 min  . Stress: Not on file  Relationships  . Social connections:    Talks on phone: Not on file    Gets together: Not on file    Attends religious service: Not on file    Active member of club or organization: Not on file    Attends meetings of clubs or organizations: Not on file    Relationship status: Not on file  . Intimate partner violence:    Fear of current or ex partner: Not on file    Emotionally abused: Not on file    Physically abused: Not on file    Forced sexual activity: Not on file  Other Topics Concern  . Not on file  Social History Narrative   Married, 2 biologic children, 2 adopted, 5 GC.   Lives in [place].  Worked 35 yrs at [employer].   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   No formal exercise.  No T/A/Ds.   Enjoys woodworking.     Outpatient Medications Prior to Visit  Medication Sig Dispense Refill  . acetaminophen (TYLENOL) 500 MG tablet Take 1,000 mg by mouth 2 (two) times daily as needed for moderate pain or headache.    [name] aspirin EC 81 MG tablet Take 81 mg by mouth daily.    [name] doxycycline (VIBRAMYCIN) 100 MG capsule Take 1 capsule (100 mg  total) by mouth 2 (two) times daily. 60 capsule 6  . gabapentin (NEURONTIN) 100 MG capsule TAKE 1 CAPSULE (100 MG TOTAL) BY MOUTH 3 (THREE) TIMES DAILY. (Patient taking differently: Take 100 mg by mouth daily. ) 60 capsule 1  . Trospium Chloride 60 MG CP24 Take 1 capsule by mouth daily.    [name] ceFAZolin (ANCEF) 2-4 GM/100ML-% IVPB Inject 100 mLs (2 g total) into the vein every 8 (eight) hours. (Patient not taking: Reported on 02/12/2018) 1 each 120  . oxyCODONE-acetaminophen (ROXICET) 5-325 MG tablet Take 1-2 tablets by mouth every 4 (four) hours as needed. (Patient not taking: Reported on 02/12/2018) 60 tablet 0  . rivaroxaban (XARELTO) 10 MG TABS tablet Take 1 tablet (10 mg total) by mouth daily. (Patient not taking: Reported on 02/12/2018) 15 tablet 0  . solifenacin (VESICARE) 5 MG tablet Take 5 mg by mouth daily.     No facility-administered medications prior to visit.     Allergies  Allergen Reactions  . Adhesive [Tape] Rash    Paper tape is ok    ROS Review of Systems  Constitutional: Negative for appetite change, chills, fatigue and fever.  HENT: Negative for congestion, dental problem, ear pain and sore throat.   Eyes: Negative for discharge, redness and visual disturbance.  Respiratory: Negative for cough, chest tightness, shortness of breath and wheezing.   Cardiovascular: Negative for chest pain, palpitations and leg swelling.  Gastrointestinal: Negative for abdominal pain, blood in stool, diarrhea, nausea and vomiting.  Genitourinary: Negative for difficulty urinating, dysuria, flank pain, frequency, hematuria and urgency.  Musculoskeletal: Negative for  arthralgias, back pain, joint swelling, myalgias and neck stiffness.  Skin: Negative for pallor and rash.  Neurological: Negative for dizziness, speech difficulty, weakness and headaches.  Hematological: Negative for adenopathy. Does not bruise/bleed easily.  Psychiatric/Behavioral: Negative for confusion and sleep disturbance. The patient is not nervous/anxious.     PE; Blood pressure (!) 146/74, pulse (!) 59, temperature 97.6 F (36.4 C), resp. rate 16, height 6' 1.5" (1.867 m), weight 264 lb (119.7 kg), SpO2 96 %. Gen: Alert, well appearing.  Patient is oriented to person, place, time, and situation. AFFECT: pleasant, lucid thought and speech. ENT: Ears: EACs clear, normal epithelium.  TMs with good light reflex and landmarks bilaterally.  Eyes: no injection, icteris, swelling, or exudate.  EOMI, PERRLA. Nose: no drainage or turbinate edema/swelling.  No injection or focal lesion.  Mouth: lips without lesion/swelling.  Oral mucosa pink and moist.  Dentition intact and without obvious caries or gingival swelling.  Oropharynx without erythema, exudate, or swelling.  Neck: supple/nontender.  No LAD, mass, or TM.  Carotid pulses 2+ bilaterally, without bruits. CV: RRR, no m/r/g.   LUNGS: CTA bilat, nonlabored resps, good aeration in all lung fields. ABD: soft, NT, ND, BS normal.  No hepatospenomegaly or mass.  No bruits. EXT: no clubbing, cyanosis, or edema.  He has compression hose on both LL's. Musculoskeletal: no joint swelling, erythema, warmth, or tenderness.   Skin - no sores or suspicious lesions or rashes or color changes Rectal exam: negative without mass, lesions or tenderness, PROSTATE EXAM: smooth and symmetric without nodules or tenderness.  Pertinent labs:  Lab Results  Component Value Date   TSH 2.07 02/10/2017   Lab Results  Component Value Date   WBC 7.9 10/12/2017   HGB 10.6 (L) 10/12/2017   HCT 33.1 (L) 10/12/2017   MCV 91.2 10/12/2017   PLT 233 10/12/2017  No  results found for: IRON, TIBC,  FERRITIN  Lab Results  Component Value Date   CREATININE 0.98 10/12/2017   BUN 16 10/12/2017   NA 135 10/12/2017   K 3.8 10/12/2017   CL 105 10/12/2017   CO2 25 10/12/2017   Lab Results  Component Value Date   ALT 14 02/10/2017   AST 12 02/10/2017   ALKPHOS 81 02/10/2017   BILITOT 0.6 02/10/2017   Lab Results  Component Value Date   CHOL 185 02/10/2017   Lab Results  Component Value Date   HDL 49.30 02/10/2017   Lab Results  Component Value Date   LDLCALC 120 (H) 02/10/2017   Lab Results  Component Value Date   TRIG 80.0 02/10/2017   Lab Results  Component Value Date   CHOLHDL 4 02/10/2017   Lab Results  Component Value Date   PSA 0.22 02/10/2017   PSA 0.20 02/09/2016   PSA 0.21 02/08/2015   Lab Results  Component Value Date   HGBA1C 5.4 05/27/2013    ASSESSMENT AND PLAN:   Health maintenance exam: Reviewed age and gender appropriate health maintenance issues (prudent diet, regular exercise, health risks of tobacco and excessive alcohol, use of seatbelts, fire alarms in home, use of sunscreen).  Also reviewed age and gender appropriate health screening as well as vaccine recommendations. Vaccines: UTD.  Shingrix discussed--rx faxed to his pharmacy today. Labs: CBC, CMET, FLP, PSA. Prostate ca screening: hx of prostate ca; DRE normal here today.  PSA surveillance today. Colon ca screening: next colonoscopy due 2020.  An After Visit Summary was printed and given to the patient.  FOLLOW UP:  Return in about 1 year (around 02/13/2019) for annual CPE (fasting).  Signed:  Crissie Sickles, MD           02/12/2018

## 2018-02-12 NOTE — Patient Instructions (Signed)

## 2018-02-13 ENCOUNTER — Encounter: Payer: Self-pay | Admitting: *Deleted

## 2018-04-14 IMAGING — RF DG HIP (WITH PELVIS) OPERATIVE*L*
1 series · 3 of 3 positions shown · non-contrast
Comparison: 05/09/2016 left hip radiographs

CLINICAL DATA: Left acetabular revision

EXAM:
OPERATIVE LEFT HIP (WITH PELVIS IF PERFORMED) 3 VIEWS
TECHNIQUE: Fluoroscopic spot image(s) were submitted for interpretation
post-operatively.

[Series 1: run · 3 of 3 slices shown]
[im 1/3]
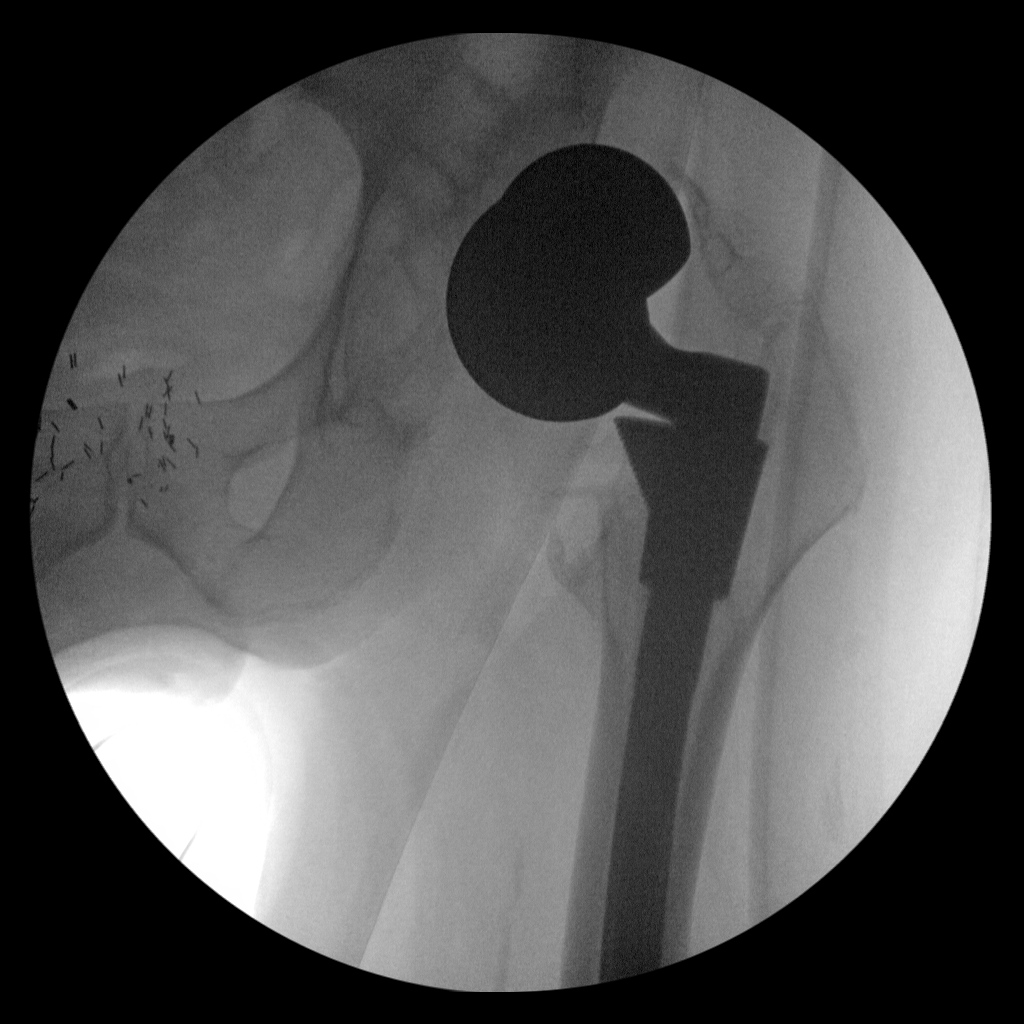
[im 2/3]
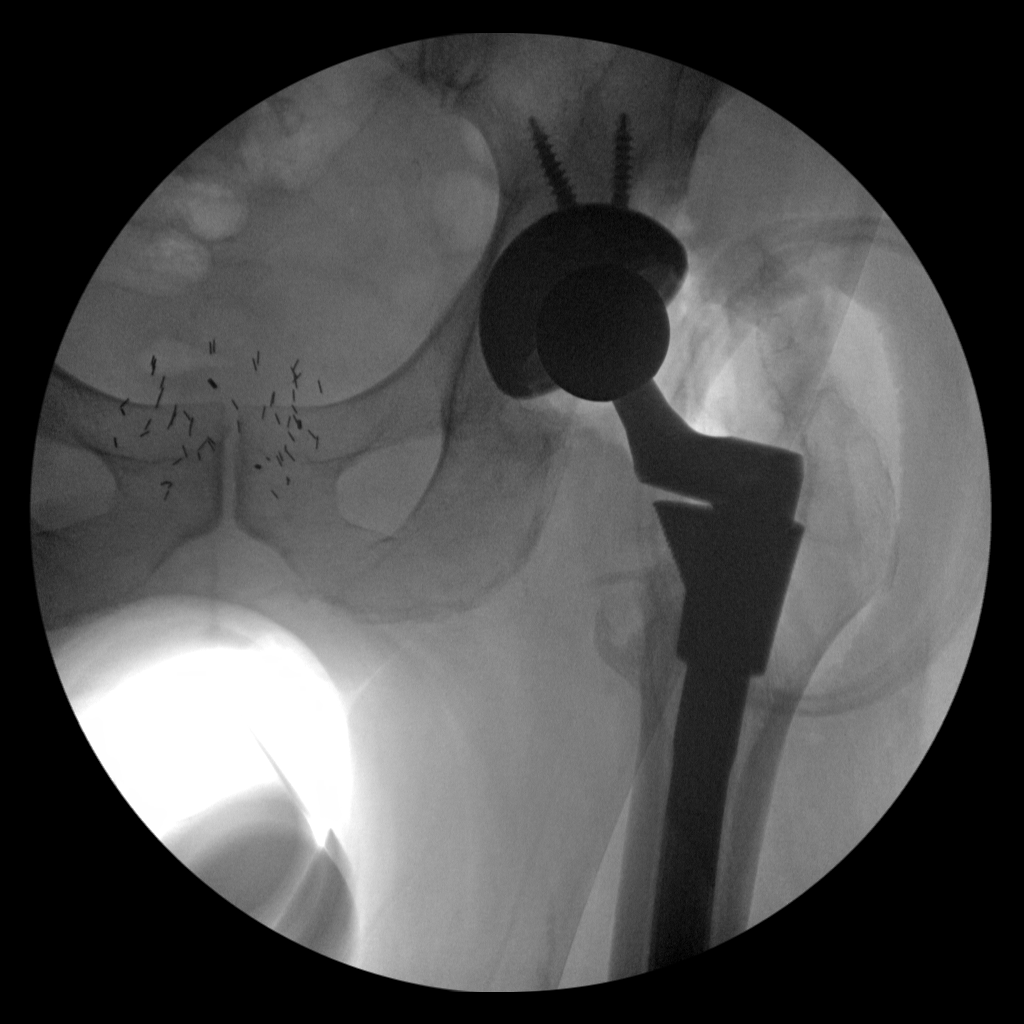
[im 3/3]
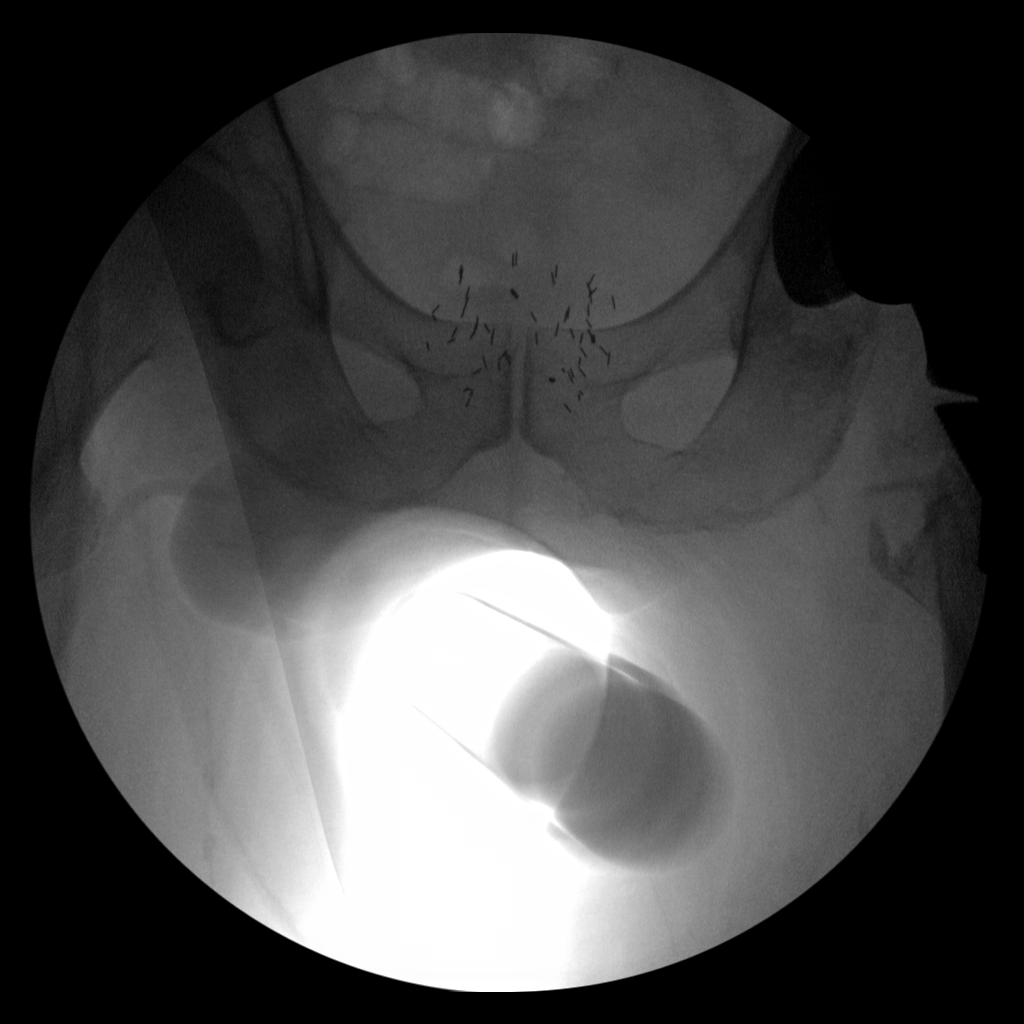

[3 of 3 positions shown; findings below may reference images not displayed]

FINDINGS: Fluoroscopy time 0.9 minutes. Three nondiagnostic spot fluoroscopic
intraoperative left hip radiographs demonstrate postsurgical changes
from completion of left total hip arthroplasty with left acetabular
prosthetic placement. Brachytherapy seeds overlie the prostate.
IMPRESSION: Intraoperative fluoroscopic guidance for left acetabular revision.

## 2018-04-28 DIAGNOSIS — Z9689 Presence of other specified functional implants: Secondary | ICD-10-CM | POA: Diagnosis not present

## 2018-05-22 DIAGNOSIS — Z9689 Presence of other specified functional implants: Secondary | ICD-10-CM | POA: Diagnosis not present

## 2018-05-22 DIAGNOSIS — M961 Postlaminectomy syndrome, not elsewhere classified: Secondary | ICD-10-CM | POA: Diagnosis not present

## 2018-05-22 DIAGNOSIS — G894 Chronic pain syndrome: Secondary | ICD-10-CM | POA: Diagnosis not present

## 2018-05-22 DIAGNOSIS — Z4549 Encounter for adjustment and management of other implanted nervous system device: Secondary | ICD-10-CM | POA: Diagnosis not present

## 2018-05-24 ENCOUNTER — Encounter: Payer: Self-pay | Admitting: Family Medicine

## 2018-06-19 ENCOUNTER — Other Ambulatory Visit (INDEPENDENT_AMBULATORY_CARE_PROVIDER_SITE_OTHER): Payer: Self-pay | Admitting: Orthopaedic Surgery

## 2018-06-19 NOTE — Telephone Encounter (Signed)
Please advise 

## 2018-06-25 ENCOUNTER — Telehealth (INDEPENDENT_AMBULATORY_CARE_PROVIDER_SITE_OTHER): Payer: Self-pay | Admitting: Orthopaedic Surgery

## 2018-06-25 NOTE — Telephone Encounter (Signed)
Mr.Brougham called he is currently taking Doxycycline for infection. Patient stated he is currently in a donut hole period with Medicare causing patient to pay more for prescriptions. Mr.Obsborne would like to know can he take one pill a day instead of 2 since he will be in a  donut hole period until the end of the year.

## 2018-06-25 NOTE — Telephone Encounter (Signed)
Please advise 

## 2018-06-25 NOTE — Telephone Encounter (Signed)
That will be fine. 

## 2018-06-25 NOTE — Telephone Encounter (Signed)
Patient aware of the below message from [facility]

## 2018-07-13 ENCOUNTER — Ambulatory Visit (INDEPENDENT_AMBULATORY_CARE_PROVIDER_SITE_OTHER): Payer: PPO | Admitting: Orthopaedic Surgery

## 2018-07-13 ENCOUNTER — Ambulatory Visit (INDEPENDENT_AMBULATORY_CARE_PROVIDER_SITE_OTHER): Payer: Self-pay

## 2018-07-13 ENCOUNTER — Ambulatory Visit (INDEPENDENT_AMBULATORY_CARE_PROVIDER_SITE_OTHER): Payer: PPO

## 2018-07-13 ENCOUNTER — Encounter (INDEPENDENT_AMBULATORY_CARE_PROVIDER_SITE_OTHER): Payer: Self-pay | Admitting: Orthopaedic Surgery

## 2018-07-13 VITALS — Ht 73.5 in | Wt 264.0 lb

## 2018-07-13 DIAGNOSIS — Z96649 Presence of unspecified artificial hip joint: Secondary | ICD-10-CM

## 2018-07-13 NOTE — Progress Notes (Signed)
The patient is now 9 months status post revision arthroplasty of an acetabular component of his left hip.  We found him on chronic doxycycline as well due to history of infection.  He is doing well overall and has no complaints or issues.  He does not have any increased pain at all.  He denies any issues with his incision.  He is 72 years old and very mobile.  He does walk with a walking stick.  On exam there is no significant leg length difference and I can see.  He tolerates me putting his left hip the range of motion with no issues.  I inspected his incision and I see no issues with his incision.  An x-ray is obtained of his pelvis from left hip and I see no complicating features of the revision of the acetabular component.  At this point we will see him back in 6 months for final AP pelvis and lateral of his left hip.  He can stop antibiotics after he is been on them for 1 year.  That would be in January.  He has any issues between now and we see him in 6 months he will keep [place] abreast of changes and let us know immediately.

## 2018-11-10 DIAGNOSIS — N3941 Urge incontinence: Secondary | ICD-10-CM | POA: Diagnosis not present

## 2018-11-10 DIAGNOSIS — Z8546 Personal history of malignant neoplasm of prostate: Secondary | ICD-10-CM | POA: Diagnosis not present

## 2018-12-17 DIAGNOSIS — H52223 Regular astigmatism, bilateral: Secondary | ICD-10-CM | POA: Diagnosis not present

## 2018-12-17 DIAGNOSIS — H5213 Myopia, bilateral: Secondary | ICD-10-CM | POA: Diagnosis not present

## 2019-01-08 ENCOUNTER — Telehealth (INDEPENDENT_AMBULATORY_CARE_PROVIDER_SITE_OTHER): Payer: Self-pay

## 2019-01-08 NOTE — Telephone Encounter (Signed)
Called patient and asked the screening questions.  Do you have now or have you had in the past 7 days a fever and/or chills? NO  Do you have now or have you had in the past 7 days a cough? NO  Do you have now or have you had in the last 7 days nausea, vomiting or abdominal pain? NO  Have you been exposed to anyone who has tested positive for COVID-19? NO  Have you or anyone who lives with you traveled within the last month? NO 

## 2019-01-11 ENCOUNTER — Ambulatory Visit (INDEPENDENT_AMBULATORY_CARE_PROVIDER_SITE_OTHER): Payer: PPO | Admitting: Orthopaedic Surgery

## 2019-01-11 ENCOUNTER — Encounter (INDEPENDENT_AMBULATORY_CARE_PROVIDER_SITE_OTHER): Payer: Self-pay | Admitting: Orthopaedic Surgery

## 2019-01-11 ENCOUNTER — Other Ambulatory Visit: Payer: Self-pay

## 2019-01-11 ENCOUNTER — Ambulatory Visit (INDEPENDENT_AMBULATORY_CARE_PROVIDER_SITE_OTHER): Payer: PPO

## 2019-01-11 DIAGNOSIS — Z96642 Presence of left artificial hip joint: Secondary | ICD-10-CM

## 2019-01-11 DIAGNOSIS — R0989 Other specified symptoms and signs involving the circulatory and respiratory systems: Secondary | ICD-10-CM | POA: Diagnosis not present

## 2019-01-11 DIAGNOSIS — E559 Vitamin D deficiency, unspecified: Secondary | ICD-10-CM | POA: Diagnosis not present

## 2019-01-11 DIAGNOSIS — Z79899 Other long term (current) drug therapy: Secondary | ICD-10-CM | POA: Diagnosis not present

## 2019-01-11 DIAGNOSIS — E782 Mixed hyperlipidemia: Secondary | ICD-10-CM | POA: Diagnosis not present

## 2019-01-11 DIAGNOSIS — E039 Hypothyroidism, unspecified: Secondary | ICD-10-CM | POA: Diagnosis not present

## 2019-01-11 DIAGNOSIS — R7303 Prediabetes: Secondary | ICD-10-CM | POA: Diagnosis not present

## 2019-01-11 DIAGNOSIS — R7309 Other abnormal glucose: Secondary | ICD-10-CM | POA: Diagnosis not present

## 2019-01-11 NOTE — Progress Notes (Signed)
Office Visit Note   Patient: [name]           Date of Birth: 1946/08/30           MRN: [id number] Visit Date: 01/11/2019              Requested by: Tammi Sou, MD [address] [place],  [address] PCP: Tammi Sou, MD   Assessment & Plan: Visit Diagnoses:  1. History of revision of total replacement of left hip joint     Plan: He seems to be doing well overall.  He will continue his current level activities.  We will see him back in 1 year for repeat AP pelvis and lateral of the left hip.  This can be done supine.  If there is any issues before then he will let us know.  Follow-Up Instructions: Return in about 1 year (around 01/11/2020).   Orders:  Orders Placed This Encounter  Procedures  . XR HIP UNILAT W OR W/O PELVIS 1V LEFT   No orders of the defined types were placed in this encounter.     Procedures: No procedures performed   Clinical Data: No additional findings.   Subjective: Chief Complaint  Patient presents with  . Left Hip - Follow-up  The patient is now 15 months status post revision of a failed and infected acetabular component involving his left hip.  He has had multiple surgeries on that left hip.  He ambulates with a cane.  He denies any pain in the left hip at all.  He is on chronic antibiotics as well orally.  He said no other acute changes in medical status  HPI  Review of Systems He currently denies any headache, chest pain, shortness of breath, fever, chills, nausea, vomiting  Objective: Vital Signs: There were no vitals taken for this visit.  Physical Exam He is alert and oriented x3 and in no acute distress Ortho Exam Examination of his left hip shows that it moves fluidly with internal and external rotation.  Compression as well is performed on his hip and he denies pain with any of these stresses on the hip or motions. Specialty Comments:  No specialty comments available.  Imaging: Xr Hip Unilat W  Or W/o Pelvis 1v Left  Result Date: 01/11/2019 An AP pelvis and lateral of the left hip shows revision acetabular component and femoral component with no significant change when compared to prior films.  There is no evidence of hardware failure.    PMFS History: Patient Active Problem List   Diagnosis Date Noted  . 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 Pain in left hip 09/01/2017  . History of revision of total replacement of left hip joint 09/01/2017  . Encounter for surveillance of recalled total hip arthroplasty hardware ([place]) 06/07/2016  . Status post revision of total hip replacement 06/07/2016  . 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 Red blood cell abnormality 02/11/2016  . Elevated blood pressure reading without diagnosis of hypertension 07/01/2013  . Peripheral neuropathy 06/02/2013  . Health maintenance examination 11/26/2011  . ELEVATED PROSTATE SPECIFIC ANTIGEN 05/21/2010  . Obesity, Class I, BMI 30-34.9 05/18/2010  . VENOUS INSUFFICIENCY, CHRONIC 05/18/2010  . OSTEOARTHRITIS, KNEES, BILATERAL 05/18/2010   Past Medical History:  Diagnosis Date  . Anterior dislocation of right shoulder 09/2013   Reduced in ED under sedation.  Dislocated posteriorly 2015, ? partial  RC tear, has f/u planned with Dr. Marlou Sa--? surgery? possible plan for 2016.  [name] Blood donor, whole blood    Has donated approx 30 gallons in his lifetime  . Chronic left hip pain    Severe DJD.  THA 2008.  Left acetabular revision in 2017 AND 2019.  [name] Chronic pain syndrome    Candidate for spinal cord stimulator (03/2017)  . Chronic renal insufficiency, stage II (mild)   . Chronic venous insufficiency    +compression hose  . DDD (degenerative disc disease), lumbar 2017   Dr. Noe Gens ortho.  He is now wearing a new leg brace.  Has had ESI and will get another.  [name] DVT (deep venous thrombosis) ([place]) 06/14/2016    provoked, bilateral LL's; in post-op setting s/p hip surgery.  Xarelto x 36mo at full dosing, then 6 more months at 1/2 dosing, then stopped xarelto.    . Foot drop, left    s/p hip fracture 1969  . 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  As of urol f/u 08/2014, plan is for me to follow PSAs and fax results to Dr. Junious Silk: PSA 02/2015 was 0.21 (stable).  Stable on f/u with Dr. Junious Silk 08/2015, 11/2016.  [name] Hx of adenomatous colonic polyps    polypectomy 2007; 2012; 2015 (+adenomatous, no high grade dysplasia); recall 5 yrs  . Hypercholesterolemia 02/2017   Recommended statin 02/10/17 but pt declined, wanted to do TLC trial first.  . Osteoarthritis    knees and hips primarily  . Post laminectomy syndrome    improved with spinal cord stim-->stim to be removed summer/fall 2019.  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  . Urge incontinence    vesicare helpful    Family History  Problem Relation Age of Onset  . Lung cancer Mother   . Heart disease Father     Past Surgical History:  Procedure Laterality Date  . ANTERIOR HIP REVISION Left 06/07/2016   Procedure: LEFT ANTERIOR HIP ACETABULAR REVISION;  Surgeon: Mcarthur Rossetti, MD;  Location: [facility];  Service: Orthopedics;  Laterality: Left;  . ANTERIOR HIP REVISION Left 10/09/2017   Procedure: Left hip acetabular revision;  Surgeon: Mcarthur Rossetti, MD;  Location: [facility];  Service: Orthopedics;  Laterality: Left;  . arthroscopy left shoulder Left 07  . bilateral carpal tunnel release  2003  . BILATERAL CARPAL TUNNEL RELEASE    . COLONOSCOPY  2007;2012;2015   04/12/2014 Tubular adenoma x 1; per Dr. Henrene Pastor recall 5 yrs  . FRACTURE SURGERY Left   . JOINT REPLACEMENT Left    hip  . LUMBAR  LAMINECTOMY/DECOMPRESSION MICRODISCECTOMY Left 03/28/2016   Procedure: Left Lumbar three-four Laminoforaminotomy;  Surgeon: Erline Levine, MD;  Location: [facility];  Service: Neurosurgery;  Laterality: Left;  left  . RADIOACTIVE SEED IMPLANT  2012  . ROTATOR CUFF REPAIR  2006   right  . seed implant prostate  2012  . SHOULDER ARTHROSCOPY Left   . SPINAL CORD STIMULATOR IMPLANT  06/2017   Very helpful  . TOTAL HIP ARTHROPLASTY  2008   left (cobalt chrome)  . VASECTOMY     Social History   Occupational History  . Not on file  Tobacco Use  . Smoking  status: Never Smoker  . Smokeless tobacco: Never Used  Substance and Sexual Activity  . Alcohol use: No  . Drug use: No  . Sexual activity: Not on file

## 2019-02-15 ENCOUNTER — Encounter: Payer: PPO | Admitting: Family Medicine

## 2019-03-18 ENCOUNTER — Ambulatory Visit (INDEPENDENT_AMBULATORY_CARE_PROVIDER_SITE_OTHER): Payer: PPO | Admitting: Physician Assistant

## 2019-03-18 ENCOUNTER — Other Ambulatory Visit: Payer: Self-pay

## 2019-03-18 ENCOUNTER — Ambulatory Visit (INDEPENDENT_AMBULATORY_CARE_PROVIDER_SITE_OTHER): Payer: PPO

## 2019-03-18 ENCOUNTER — Encounter: Payer: Self-pay | Admitting: Physician Assistant

## 2019-03-18 DIAGNOSIS — Z96642 Presence of left artificial hip joint: Secondary | ICD-10-CM | POA: Diagnosis not present

## 2019-03-18 MED ORDER — MELOXICAM 7.5 MG PO TABS: ORAL_TABLET | ORAL | 0 refills | Status: DC

## 2019-03-18 MED ORDER — METHOCARBAMOL 500 MG PO TABS: 500.0000 mg | ORAL_TABLET | Freq: Two times a day (BID) | ORAL | 0 refills | Status: DC | PRN

## 2019-03-18 NOTE — Progress Notes (Signed)
Office Visit Note   Patient: [name]           Date of Birth: [birth year]           MRN: [id number] Visit Date: 03/18/2019              Requested by: Joseph Sou, MD [address] [place],  [place] [address] PCP: Joseph Sou, MD   Assessment & Plan: Visit Diagnoses:  1. History of revision of total replacement of left hip joint     Plan: Reassurance given to patient that the components all appear well-seated without any hardware failure.  His injury and clinical findings are consistent with muscle strain.  Recommend relative rest.  Use of anti-inflammatory will place him on Mobic 7.5 mg once daily up to twice daily as needed.  No other NSAIDs while on the Mobic and this is explained to him at length.  Also is given a muscle relaxant Robaxin to take mainly at night.  He can take during the day but is cautioned about driving or operating heavy equipment on this.  He will follow-up with [place] in a month sooner if he develops any worsening symptoms.  Questions encouraged and answered at length.  Follow-Up Instructions: Return in about 4 weeks (around 04/15/2019).   Orders:  Orders Placed This Encounter  Procedures  . XR HIP UNILAT W OR W/O PELVIS 2-3 VIEWS LEFT   Meds ordered this encounter  Medications  . meloxicam (MOBIC) 7.5 MG tablet    Sig: 1 po bid prn pain    Dispense:  30 tablet    Refill:  0  . methocarbamol (ROBAXIN) 500 MG tablet    Sig: Take 1 tablet (500 mg total) by mouth every 12 (twelve) hours as needed for muscle spasms.    Dispense:  30 tablet    Refill:  0      Procedures: No procedures performed   Clinical Data: No additional findings.   Subjective: Chief Complaint  Patient presents with  . Left Hip - Pain    HPI [name] is well-known.by my service comes in today due to left hip pain.  He reports about a month ago he was getting on his lawnmower somehow got his foot [name] straining his hip.  This is the same hip he has had  multiple surgeries in the hip revision on.  He is requesting repeat x-rays as he is concerned that he may cause some type of damage to the left hip with the implants.  He states having mainly pain in his left buttocks region.  Always has soreness along the lateral aspect of the left hip. Review of Systems See HPI otherwise negative or noncontributory.  Patient denies any fevers or chills  Objective: Vital Signs: There were no vitals taken for this visit.  Physical Exam General well-developed well-nourished male in no acute distress.  Ambulates without any assistive device. Ortho Exam Left hip good range of motion without pain.  Minimal tenderness over the greater trochanteric region.  Negative straight leg raise bilaterally. Specialty Comments:  No specialty comments available.  Imaging: Xr Hip Unilat W Or W/o Pelvis 2-3 Views Left  Result Date: 03/18/2019 AP pelvis and lateral view of the left hip: No periosteal reaction.  No evidence of acute fracture.  No change in overall position alignment of the total hip components.  No hardware failure.    PMFS History: Patient Active Problem List   Diagnosis Date Noted  .  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 Pain in left hip 09/01/2017  . History of revision of total replacement of left hip joint 09/01/2017  . Encounter for surveillance of recalled total hip arthroplasty hardware ([place]) 06/07/2016  . Status post revision of total hip replacement 06/07/2016  . 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 Red blood cell abnormality 02/11/2016  . Elevated blood pressure reading without diagnosis of hypertension 07/01/2013  . Peripheral neuropathy 06/02/2013  . Health maintenance examination 11/26/2011  . ELEVATED PROSTATE SPECIFIC ANTIGEN 05/21/2010  . Obesity, Class I, BMI 30-34.9 05/18/2010  . VENOUS INSUFFICIENCY, CHRONIC 05/18/2010  .  OSTEOARTHRITIS, KNEES, BILATERAL 05/18/2010   Past Medical History:  Diagnosis Date  . Anterior dislocation of right shoulder 09/2013   Reduced in ED under sedation.  Dislocated posteriorly 2015, ? partial RC tear, has f/u planned with Dr. Marlou Sa--? surgery? possible plan for 2016.  [name] Blood donor, whole blood    Has donated approx 30 gallons in his lifetime  . Chronic left hip pain    Severe DJD.  THA 2008.  Left acetabular revision in 2017 AND 2019.  [name] Chronic pain syndrome    Candidate for spinal cord stimulator (03/2017)  . Chronic renal insufficiency, stage II (mild)   . Chronic venous insufficiency    +compression hose  . DDD (degenerative disc disease), lumbar 2017   Dr. Noe Gens ortho.  He is now wearing a new leg brace.  Has had ESI and will get another.  [name] DVT (deep venous thrombosis) ([place]) 06/14/2016   provoked, bilateral LL's; in post-op setting s/p hip surgery.  Xarelto x 10mo at full dosing, then 6 more months at 1/2 dosing, then stopped xarelto.    . Foot drop, left    s/p hip fracture 1969  . 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  As of urol f/u 08/2014, plan is for me to follow PSAs and fax results to Dr. Junious Silk: PSA 02/2015 was 0.21 (stable).  Stable on f/u with Dr. Junious Silk 08/2015, 11/2016.  [name] Hx of adenomatous colonic polyps    polypectomy 2007; 2012; 2015 (+adenomatous, no high grade dysplasia); recall 5 yrs  . Hypercholesterolemia 02/2017   Recommended statin 02/10/17 but pt declined, wanted to do TLC trial first.  . Osteoarthritis    knees and hips primarily  . Post laminectomy syndrome    improved with spinal cord stim-->stim to be removed summer/fall 2019.  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  . Urge incontinence     vesicare helpful    Family History  Problem Relation Age of Onset  . Lung cancer Mother   . Heart disease Father     Past Surgical History:  Procedure Laterality Date  . ANTERIOR HIP REVISION Left 06/07/2016   Procedure: LEFT ANTERIOR HIP ACETABULAR REVISION;  Surgeon: Mcarthur Rossetti, MD;  Location: [facility];  Service: Orthopedics;  Laterality: Left;  . ANTERIOR HIP REVISION Left 10/09/2017   Procedure: Left hip acetabular revision;  Surgeon: Mcarthur Rossetti, MD;  Location: [facility];  Service: Orthopedics;  Laterality: Left;  . arthroscopy left shoulder Left 07  . bilateral carpal tunnel release  2003  . BILATERAL CARPAL TUNNEL RELEASE    . COLONOSCOPY  2007;2012;2015   04/12/2014 Tubular adenoma x 1; per Dr. Henrene Pastor recall 5 yrs  . FRACTURE SURGERY Left   . JOINT REPLACEMENT Left    hip  . LUMBAR LAMINECTOMY/DECOMPRESSION MICRODISCECTOMY Left 03/28/2016   Procedure: Left Lumbar three-four Laminoforaminotomy;  Surgeon: Erline Levine, MD;  Location: [facility];  Service: Neurosurgery;  Laterality: Left;  left  . RADIOACTIVE SEED IMPLANT  2012  . ROTATOR CUFF REPAIR  2006   right  . seed implant prostate  2012  . SHOULDER ARTHROSCOPY Left   . SPINAL CORD STIMULATOR IMPLANT  06/2017   Very helpful  . TOTAL HIP ARTHROPLASTY  2008   left (cobalt chrome)  . VASECTOMY     Social History   Occupational History  . Not on file  Tobacco Use  . Smoking status: Never Smoker  . Smokeless tobacco: Never Used  Substance and Sexual Activity  . Alcohol use: No  . Drug use: No  . Sexual activity: Not on file

## 2019-04-05 ENCOUNTER — Encounter: Payer: Self-pay | Admitting: Internal Medicine

## 2019-04-06 ENCOUNTER — Encounter: Payer: Self-pay | Admitting: Internal Medicine

## 2019-04-15 ENCOUNTER — Other Ambulatory Visit: Payer: Self-pay

## 2019-04-15 ENCOUNTER — Encounter: Payer: Self-pay | Admitting: Physician Assistant

## 2019-04-15 ENCOUNTER — Ambulatory Visit (INDEPENDENT_AMBULATORY_CARE_PROVIDER_SITE_OTHER): Payer: PPO | Admitting: Physician Assistant

## 2019-04-15 DIAGNOSIS — M25452 Effusion, left hip: Secondary | ICD-10-CM | POA: Diagnosis not present

## 2019-04-15 MED ORDER — TRAMADOL HCL 50 MG PO TABS: 50.0000 mg | ORAL_TABLET | Freq: Four times a day (QID) | ORAL | 0 refills | Status: DC | PRN

## 2019-04-15 MED ORDER — METHOCARBAMOL 500 MG PO TABS: 500.0000 mg | ORAL_TABLET | Freq: Two times a day (BID) | ORAL | 1 refills | Status: DC | PRN

## 2019-04-15 NOTE — Progress Notes (Signed)
HPI: [name] returns today for follow-up of his left hip pain.  Again he is now about 2 months out from injuring his left hip when he somehow got his foot hung on his lawnmower.  He states that overall he feels like he is trending towards improvement.  Still ambulating with a cane.  Has some pain but mainly on the lateral aspect of the thigh.  He does report taking some old tramadol and this greatly helped with his pain.  He also feels Robaxin is helped.  He is asking for refill on tramadol and Robaxin today.  He is having no radicular symptoms down the left leg.  No groin pain.   Review of systems see HPI otherwise negative.  Physical exam: General well-developed well-nourished male who walks with a cane with a slight antalgic gait on the left. Left hip good range of motion without pain.  He has tenderness over the trochanteric region of the left hip only.  Calf supple nontender.  Impression: Left hip pain/trochanteric bursitis.  Plan: This point time we will have him do some IT band stretching exercises as shown.  We will refill his Robaxin also given prescription for tramadol.  He will follow-up with [place] on as-needed basis pain persist or becomes worse.  Questions encouraged and answered at length.  He may benefit from a trochanteric injection in the future.

## 2019-04-22 ENCOUNTER — Other Ambulatory Visit: Payer: Self-pay

## 2019-04-22 ENCOUNTER — Encounter: Payer: Self-pay | Admitting: Family Medicine

## 2019-04-22 ENCOUNTER — Ambulatory Visit (INDEPENDENT_AMBULATORY_CARE_PROVIDER_SITE_OTHER): Payer: PPO | Admitting: Family Medicine

## 2019-04-22 VITALS — BP 132/72 | HR 71 | Temp 98.0°F | Resp 16 | Ht 73.5 in | Wt 251.8 lb

## 2019-04-22 DIAGNOSIS — Z0000 Encounter for general adult medical examination without abnormal findings: Secondary | ICD-10-CM

## 2019-04-22 DIAGNOSIS — Z125 Encounter for screening for malignant neoplasm of prostate: Secondary | ICD-10-CM | POA: Diagnosis not present

## 2019-04-22 DIAGNOSIS — E7800 Pure hypercholesterolemia, unspecified: Secondary | ICD-10-CM

## 2019-04-22 DIAGNOSIS — Z86718 Personal history of other venous thrombosis and embolism: Secondary | ICD-10-CM

## 2019-04-22 DIAGNOSIS — I872 Venous insufficiency (chronic) (peripheral): Secondary | ICD-10-CM

## 2019-04-22 MED ORDER — ZOSTER VAC RECOMB ADJUVANTED 50 MCG/0.5ML IM SUSR: 0.5000 mL | Freq: Once | INTRAMUSCULAR | 0 refills | Status: AC

## 2019-04-22 NOTE — Progress Notes (Signed)
Shingrix script sent to pt's pharmacy.

## 2019-04-22 NOTE — Progress Notes (Signed)
Office Note 04/22/2019  CC:  Chief Complaint  Patient presents with  . Annual Exam    pt is fasting    HPI:  [name] is a 73 y.o. White male who is here for annual health maintenance exam.  Going on vacation to [place] next week. Has been feeling well.  Pulled/strained L thigh muscle while stepping onto lawn mower about 2 mo ago.  Went to ortho and was rx'd muscle relaxer and tramadol.  The problem has resolved now.  Purposeful wt loss of 12+ lb since last October.   Past Medical History:  Diagnosis Date  . Anterior dislocation of right shoulder 09/2013   Reduced in ED under sedation.  Dislocated posteriorly 2015, ? partial RC tear, has f/u planned with Dr. Marlou Sa--? surgery? possible plan for 2016.  [name] Blood donor, whole blood    Has donated approx 30 gallons in his lifetime  . Chronic left hip pain    Severe DJD.  THA 2008.  Left acetabular revision in 2017 AND 2019.  [name] Chronic pain syndrome    Candidate for spinal cord stimulator (03/2017)  . Chronic renal insufficiency, stage II (mild)   . Chronic venous insufficiency    +compression hose  . DDD (degenerative disc disease), lumbar 2017   Dr. Noe Gens ortho.  He is now wearing a new leg brace.  Has had ESI and will get another.  [name] DVT (deep venous thrombosis) ([place]) 06/14/2016   provoked, bilateral LL's; in post-op setting s/p hip surgery.  Xarelto x 26mo at full dosing, then 6 more months at 1/2 dosing, then stopped xarelto.    . Foot drop, left    s/p hip fracture 1969  . 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  As of urol f/u 08/2014, plan is for me to follow PSAs and fax results to Dr. Junious Silk: PSA 02/2015 was 0.21 (stable).  Stable on f/u with Dr. Junious Silk 08/2015, 11/2016.  [name] Hx of adenomatous colonic polyps    polypectomy 2007; 2012; 2015 (+adenomatous, no high grade dysplasia); recall 5 yrs  .  Hypercholesterolemia 02/2017   Recommended statin 02/10/17 but pt declined, wanted to do TLC trial first.  . Osteoarthritis    knees and hips primarily  . Post laminectomy syndrome    improved with spinal cord stim-->stim to be removed summer/fall 2019.  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  . Urge incontinence    vesicare helpful    Past Surgical History:  Procedure Laterality Date  . ANTERIOR HIP REVISION Left 06/07/2016   Procedure: LEFT ANTERIOR HIP ACETABULAR REVISION;  Surgeon: Mcarthur Rossetti, MD;  Location: [facility];  Service: Orthopedics;  Laterality: Left;  . ANTERIOR HIP REVISION Left 10/09/2017   Procedure: Left hip acetabular revision;  Surgeon: Mcarthur Rossetti, MD;  Location: [facility];  Service: Orthopedics;  Laterality: Left;  . arthroscopy left shoulder Left 07  . bilateral carpal tunnel release  2003  . BILATERAL CARPAL TUNNEL RELEASE    . COLONOSCOPY  2007;2012;2015   04/12/2014 Tubular adenoma x 1; per Dr. Henrene Pastor recall 5 yrs  . FRACTURE SURGERY Left   . JOINT REPLACEMENT Left    hip  . LUMBAR LAMINECTOMY/DECOMPRESSION MICRODISCECTOMY Left 03/28/2016  Procedure: Left Lumbar three-four Laminoforaminotomy;  Surgeon: Erline Levine, MD;  Location: [facility];  Service: Neurosurgery;  Laterality: Left;  left  . RADIOACTIVE SEED IMPLANT  2012  . ROTATOR CUFF REPAIR  2006   right  . seed implant prostate  2012  . SHOULDER ARTHROSCOPY Left   . SPINAL CORD STIMULATOR IMPLANT  06/2017   Very helpful  . TOTAL HIP ARTHROPLASTY  2008   left ([facility] [facility])  . VASECTOMY      Family History  Problem Relation Age of Onset  . Lung cancer Mother   . Heart disease Father     Social History   Socioeconomic History  . Marital status: Married    Spouse name: Not on file  . Number of children: Not on file  . Years  of education: Not on file  . Highest education level: Not on file  Occupational History  . Not on file  Social Needs  . Financial resource strain: Not on file  . Food insecurity    Worry: Not on file    Inability: Not on file  . Transportation needs    Medical: Not on file    Non-medical: Not on file  Tobacco Use  . Smoking status: Never Smoker  . Smokeless tobacco: Never Used  Substance and Sexual Activity  . Alcohol use: No  . Drug use: No  . Sexual activity: Not on file  Lifestyle  . Physical activity    Days per week: 3 days    Minutes per session: 120 min  . Stress: Not on file  Relationships  . Social Herbalist on phone: Not on file    Gets together: Not on file    Attends religious service: Not on file    Active member of club or organization: Not on file    Attends meetings of clubs or organizations: Not on file    Relationship status: Not on file  . Intimate partner violence    Fear of current or ex partner: Not on file    Emotionally abused: Not on file    Physically abused: Not on file    Forced sexual activity: Not on file  Other Topics Concern  . Not on file  Social History Narrative   Married, 2 biologic children, 2 adopted, 5 GC.   Lives in [place].  Worked 35 yrs at [employer].   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   No formal exercise.  No T/A/Ds.   Enjoys woodworking.    Outpatient Medications Prior to Visit  Medication Sig Dispense Refill  . acetaminophen (TYLENOL) 500 MG tablet Take 1,000 mg by mouth 2 (two) times daily as needed for moderate pain or headache.    [name] aspirin EC 81 MG tablet Take 81 mg by mouth daily.    . Trospium Chloride 60 MG CP24 Take 1 capsule by mouth daily.    [name] doxycycline (VIBRAMYCIN) 100 MG capsule TAKE 1 CAPSULE BY MOUTH TWICE A DAY (Patient not taking: Reported on 04/22/2019) 60 capsule 6  . traMADol (ULTRAM) 50 MG tablet Take 1 tablet (50 mg total) by mouth every 6 (six) hours as needed.  (Patient not taking: Reported on 04/22/2019) 30 tablet 0  . gabapentin (NEURONTIN) 100 MG capsule TAKE 1 CAPSULE (100 MG TOTAL) BY MOUTH 3 (THREE) TIMES DAILY. (Patient not taking: Reported on 04/22/2019) 60 capsule 1  . meloxicam (MOBIC) 7.5 MG tablet 1 po bid prn pain (  Patient not taking: Reported on 04/22/2019) 30 tablet 0  . methocarbamol (ROBAXIN) 500 MG tablet Take 1 tablet (500 mg total) by mouth every 12 (twelve) hours as needed for muscle spasms. (Patient not taking: Reported on 04/22/2019) 40 tablet 1   No facility-administered medications prior to visit.     Allergies  Allergen Reactions  . Adhesive [Tape] Rash    Paper tape is ok    ROS Review of Systems  Constitutional: Negative for appetite change, chills, fatigue and fever.  HENT: Negative for congestion, dental problem, ear pain and sore throat.   Eyes: Negative for discharge, redness and visual disturbance.  Respiratory: Negative for cough, chest tightness, shortness of breath and wheezing.   Cardiovascular: Negative for chest pain, palpitations and leg swelling.  Gastrointestinal: Negative for abdominal pain, blood in stool, diarrhea, nausea and vomiting.  Genitourinary: Negative for difficulty urinating, dysuria, flank pain, frequency, hematuria and urgency.  Musculoskeletal: Negative for arthralgias, back pain, joint swelling, myalgias and neck stiffness.  Skin: Negative for pallor and rash.  Neurological: Negative for dizziness, speech difficulty, weakness and headaches.  Hematological: Negative for adenopathy. Does not bruise/bleed easily.  Psychiatric/Behavioral: Negative for confusion and sleep disturbance. The patient is not nervous/anxious.     PE; Blood pressure 132/72, pulse 71, temperature 98 F (36.7 C), temperature source Temporal, resp. rate 16, height 6' 1.5" (1.867 m), weight 251 lb 12.8 oz (114.2 kg), SpO2 97 %. Gen: Alert, well appearing.  Patient is oriented to person, place, time, and  situation. AFFECT: pleasant, lucid thought and speech. ENT: Ears: EACs clear, normal epithelium.  TMs with good light reflex and landmarks bilaterally.  Eyes: no injection, icteris, swelling, or exudate.  EOMI, PERRLA. Nose: no drainage or turbinate edema/swelling.  No injection or focal lesion.  Mouth: lips without lesion/swelling.  Oral mucosa pink and moist.  Dentition intact and without obvious caries or gingival swelling.  Oropharynx without erythema, exudate, or swelling.  Neck: supple/nontender.  No LAD, mass, or TM.  Carotid pulses 2+ bilaterally, without bruits. CV: RRR, no m/r/g.   LUNGS: CTA bilat, nonlabored resps, good aeration in all lung fields. ABD: soft, NT, ND, BS normal.  No hepatospenomegaly or mass.  No bruits. EXT: no clubbing or cyanosis.  He has trace-1+ pitting in both LL's and is wearing compression stockings. Musculoskeletal: no joint swelling, erythema, warmth, or tenderness.  ROM of all joints intact. Skin - no sores or suspicious lesions or rashes or color changes   Pertinent labs:  Lab Results  Component Value Date   TSH 2.07 02/10/2017   Lab Results  Component Value Date   WBC 5.5 02/12/2018   HGB 15.3 02/12/2018   HCT 47.5 02/12/2018   MCV 80.1 02/12/2018   PLT 258.0 02/12/2018   Lab Results  Component Value Date   CREATININE 1.06 02/12/2018   BUN 19 02/12/2018   NA 137 02/12/2018   K 4.5 02/12/2018   CL 105 02/12/2018   CO2 27 02/12/2018   Lab Results  Component Value Date   ALT 12 02/12/2018   AST 16 02/12/2018   ALKPHOS 95 02/12/2018   BILITOT 0.4 02/12/2018   Lab Results  Component Value Date   CHOL 181 02/12/2018   Lab Results  Component Value Date   HDL 38.20 (L) 02/12/2018   Lab Results  Component Value Date   LDLCALC 114 (H) 02/12/2018   Lab Results  Component Value Date   TRIG 143.0 02/12/2018   Lab Results  Component Value  Date   CHOLHDL 5 02/12/2018   Lab Results  Component Value Date   PSA 0.43 02/12/2018    PSA 0.22 02/10/2017   PSA 0.20 02/09/2016   Lab Results  Component Value Date   HGBA1C 5.4 05/27/2013    ASSESSMENT AND PLAN:   Health maintenance exam: Reviewed age and gender appropriate health maintenance issues (prudent diet, regular exercise, health risks of tobacco and excessive alcohol, use of seatbelts, fire alarms in home, use of sunscreen).  Also reviewed age and gender appropriate health screening as well as vaccine recommendations. Vaccines: all UTD.  Shingrix: send rx to pharmacy (he never got called about this last time we sent rx to his pharmacy). Labs: CBC, CMET, FLP, PSA. Prostate ca screening: hx of prostate ca, surveillance PSAs had been done by urology at one point, but I have assumed responsibility for this lately.  PSA today.  Forward results to urology. Colon ca screening: hx of adenomatous polyps, recall 04/2019 (Dr. Dicie Beam is scheduled.  An After Visit Summary was printed and given to the patient.  FOLLOW UP:  Return in about 1 year (around 04/21/2020) for annual CPE (fasting).  Signed:  Crissie Sickles, MD           04/22/2019

## 2019-04-22 NOTE — Patient Instructions (Signed)

## 2019-04-23 LAB — CBC WITH DIFFERENTIAL/PLATELET
Basophils Absolute: 0.1 10*3/uL (ref 0.0–0.1)
Basophils Relative: 1.5 % (ref 0.0–3.0)
Eosinophils Absolute: 0.2 10*3/uL (ref 0.0–0.7)
Eosinophils Relative: 2.9 % (ref 0.0–5.0)
HCT: 44.7 % (ref 39.0–52.0)
Hemoglobin: 14.2 g/dL (ref 13.0–17.0)
Lymphocytes Relative: 28.5 % (ref 12.0–46.0)
Lymphs Abs: 2.1 10*3/uL (ref 0.7–4.0)
MCHC: 31.8 g/dL (ref 30.0–36.0)
MCV: 84.1 fl (ref 78.0–100.0)
Monocytes Absolute: 0.7 10*3/uL (ref 0.1–1.0)
Monocytes Relative: 8.9 % (ref 3.0–12.0)
Neutro Abs: 4.3 10*3/uL (ref 1.4–7.7)
Neutrophils Relative %: 58.2 % (ref 43.0–77.0)
Platelets: 348 10*3/uL (ref 150.0–400.0)
RBC: 5.31 Mil/uL (ref 4.22–5.81)
RDW: 17.1 % — ABNORMAL HIGH (ref 11.5–15.5)
WBC: 7.4 10*3/uL (ref 4.0–10.5)

## 2019-04-23 LAB — LIPID PANEL
Cholesterol: 158 mg/dL (ref 0–200)
HDL: 32.3 mg/dL — ABNORMAL LOW (ref >39.00)
LDL Cholesterol: 113 mg/dL — ABNORMAL HIGH (ref 0–99)
NonHDL: 125.52
Total CHOL/HDL Ratio: 5
Triglycerides: 63 mg/dL (ref 0.0–149.0)
VLDL: 12.6 mg/dL (ref 0.0–40.0)

## 2019-04-23 LAB — COMPREHENSIVE METABOLIC PANEL
ALT: 9 U/L (ref 0–53)
AST: 11 U/L (ref 0–37)
Albumin: 3.8 g/dL (ref 3.5–5.2)
Alkaline Phosphatase: 88 U/L (ref 39–117)
BUN: 18 mg/dL (ref 6–23)
CO2: 26 mEq/L (ref 19–32)
Calcium: 9.9 mg/dL (ref 8.4–10.5)
Chloride: 105 mEq/L (ref 96–112)
Creatinine, Ser: 1.11 mg/dL (ref 0.40–1.50)
GFR: 64.71 mL/min (ref >60.00)
Glucose, Bld: 95 mg/dL (ref 70–99)
Potassium: 5 mEq/L (ref 3.5–5.1)
Sodium: 137 mEq/L (ref 135–145)
Total Bilirubin: 0.5 mg/dL (ref 0.2–1.2)
Total Protein: 7 g/dL (ref 6.0–8.3)

## 2019-04-23 LAB — PSA, MEDICARE: PSA: 4.11 ng/ml — ABNORMAL HIGH (ref 0.10–4.00)

## 2019-04-26 ENCOUNTER — Encounter: Payer: Self-pay | Admitting: Family Medicine

## 2019-05-04 DIAGNOSIS — R9721 Rising PSA following treatment for malignant neoplasm of prostate: Secondary | ICD-10-CM | POA: Diagnosis not present

## 2019-05-04 DIAGNOSIS — Z8546 Personal history of malignant neoplasm of prostate: Secondary | ICD-10-CM | POA: Diagnosis not present

## 2019-05-05 ENCOUNTER — Other Ambulatory Visit: Payer: Self-pay

## 2019-05-05 ENCOUNTER — Ambulatory Visit (AMBULATORY_SURGERY_CENTER): Payer: Self-pay | Admitting: *Deleted

## 2019-05-05 VITALS — Ht 74.0 in | Wt 245.0 lb

## 2019-05-05 DIAGNOSIS — Z86010 Personal history of colonic polyps: Secondary | ICD-10-CM

## 2019-05-05 MED ORDER — NA SULFATE-K SULFATE-MG SULF 17.5-3.13-1.6 GM/177ML PO SOLN: ORAL | 0 refills | Status: DC

## 2019-05-05 NOTE — Progress Notes (Signed)
Patient's pre-visit was done today over the phone with the patient due to COVID-19 pandemic. Name,DOB and address verified. Insurance verified. Packet of Prep instructions mailed to patient including copy of a consent form and pre-procedure patient acknowledgement form-pt is aware. Patient understands to call us back with any questions or concerns.  Patient denies any allergies to eggs or soy. Patient denies any problems with anesthesia/sedation. Patient denies any oxygen use at home. Patient denies taking any diet/weight loss medications or blood thinners. EMMI education assisgned to patient on colonoscopy, this was explained and instructions given to patient. Pt is aware that care partner will wait in the car during procedure; if they feel like they will be too hot to wait in the car; they may wait in the lobby.  We want them to wear a mask (we do not have any that we can provide them), practice social distancing, and we will check their temperatures when they get here.  I did remind patient that their care partner needs to stay in the parking lot the entire time. Pt will wear mask into building.

## 2019-05-18 ENCOUNTER — Telehealth: Payer: Self-pay | Admitting: Internal Medicine

## 2019-05-18 NOTE — Telephone Encounter (Signed)
Left message for patient regarding Covid-19 screening questions. °Covid-19 Screening Questions: °  °Do you now or have you had a fever in the last 14 days?  °  °Do you have any respiratory symptoms of shortness of breath or cough now or in the last 14 days?  °  °Do you have any family members or close contacts with diagnosed or suspected Covid-19 in the past 14 days?  °  °Have you been tested for Covid-19 and found to be positive?  °  ° °

## 2019-05-18 NOTE — Telephone Encounter (Signed)
Pt returned call and answered “No” to all questions.  °  °Pt made aware of that care partner may come to the lobby during the procedure but will need to provide their own mask. ° ° °

## 2019-05-19 ENCOUNTER — Encounter: Payer: Self-pay | Admitting: Internal Medicine

## 2019-05-19 ENCOUNTER — Ambulatory Visit (AMBULATORY_SURGERY_CENTER): Payer: PPO | Admitting: Internal Medicine

## 2019-05-19 ENCOUNTER — Other Ambulatory Visit: Payer: Self-pay

## 2019-05-19 VITALS — BP 115/70 | HR 53 | Temp 98.7°F | Resp 20 | Ht 74.0 in | Wt 245.0 lb

## 2019-05-19 DIAGNOSIS — Z1211 Encounter for screening for malignant neoplasm of colon: Secondary | ICD-10-CM | POA: Diagnosis not present

## 2019-05-19 DIAGNOSIS — D122 Benign neoplasm of ascending colon: Secondary | ICD-10-CM | POA: Diagnosis not present

## 2019-05-19 DIAGNOSIS — Z86010 Personal history of colonic polyps: Secondary | ICD-10-CM

## 2019-05-19 MED ORDER — SODIUM CHLORIDE 0.9 % IV SOLN: 500.0000 mL | Freq: Once | INTRAVENOUS | Status: DC

## 2019-05-19 NOTE — Progress Notes (Signed)
Pt's states no medical or surgical changes since previsit or office visit. 

## 2019-05-19 NOTE — Progress Notes (Signed)
Called to room to assist during endoscopic procedure.  Patient ID and intended procedure confirmed with present staff. Received instructions for my participation in the procedure from the performing physician.  

## 2019-05-19 NOTE — Progress Notes (Signed)
Temperature taken by J.B., CMA, VS taken by C.W. CMA

## 2019-05-19 NOTE — Progress Notes (Signed)
To PACU, VSS. Report to [email or web address] 

## 2019-05-19 NOTE — Op Note (Signed)
[facility] Patient Name: [name] Procedure Date: 05/19/2019 9:17 AM MRN: [id number] Endoscopist: Docia Chuck. Henrene Pastor , MD Age: 73 Referring MD:  Date of Birth: 09/01/1946 Gender: Male Account #: [id number] Procedure:                Colonoscopy with cold snare polypectomy x 3 Indications:              High risk colon cancer surveillance: Personal                            history of adenoma (10 mm or greater in size), High                            risk colon cancer surveillance: Personal history of                            multiple (3 or more) adenomas. Previous                            examinations 2007, 2012, 2015 Medicines:                Monitored Anesthesia Care Procedure:                Pre-Anesthesia Assessment:                           - Prior to the procedure, a History and Physical                            was performed, and patient medications and                            allergies were reviewed. The patient's tolerance of                            previous anesthesia was also reviewed. The risks                            and benefits of the procedure and the sedation                            options and risks were discussed with the patient.                            All questions were answered, and informed consent                            was obtained. Prior Anticoagulants: The patient has                            taken no previous anticoagulant or antiplatelet                            agents. ASA Grade Assessment: II - A patient with  mild systemic disease. After reviewing the risks                            and benefits, the patient was deemed in                            satisfactory condition to undergo the procedure.                           After obtaining informed consent, the colonoscope                            was passed under direct vision. Throughout the                            procedure, the  patient's blood pressure, pulse, and                            oxygen saturations were monitored continuously. The                            Colonoscope was introduced through the anus and                            advanced to the the cecum, identified by                            appendiceal orifice and ileocecal valve. The                            ileocecal valve, appendiceal orifice, and rectum                            were photographed. The quality of the bowel                            preparation was excellent. The colonoscopy was                            performed without difficulty. The patient tolerated                            the procedure well. The bowel preparation used was                            SUPREP via split dose instruction. Scope In: 9:25:37 AM Scope Out: 9:43:27 AM Scope Withdrawal Time: 0 hours 9 minutes 41 seconds  Total Procedure Duration: 0 hours 17 minutes 50 seconds  Findings:                 Three polyps were found in the ascending colon. The                            polyps were 2 to 4 mm in size. These polyps  were                            removed with a cold snare. Resection and retrieval                            were complete.                           A few diverticula were found in the sigmoid colon.                           The exam was otherwise without abnormality on                            direct and retroflexion views. The colon was                            redundant Complications:            No immediate complications. Estimated blood loss:                            None. Estimated Blood Loss:     Estimated blood loss: none. Impression:               - Three 2 to 4 mm polyps in the ascending colon,                            removed with a cold snare. Resected and retrieved.                           - Diverticulosis in the sigmoid colon.                           - The examination was otherwise normal on direct                             and retroflexion views.                           - REDUNDANT COLON Recommendation:           - Repeat colonoscopy in 5 years for surveillance                            (REDUNDANT COLON).                           - Patient has a contact number available for                            emergencies. The signs and symptoms of potential                            delayed complications were discussed with the  patient. Return to normal activities tomorrow.                            Written discharge instructions were provided to the                            patient.                           - Resume previous diet.                           - Continue present medications.                           - Await pathology results. Docia Chuck. Henrene Pastor, MD 05/19/2019 9:51:11 AM This report has been signed electronically.

## 2019-05-19 NOTE — Patient Instructions (Signed)
Discharge instructions given. Handouts on polyps and Diverticulosis. Resume previous medications. YOU HAD AN ENDOSCOPIC PROCEDURE TODAY AT [facility]:   Refer to the procedure report that was given to you for any specific questions about what was found during the examination.  If the procedure report does not answer your questions, please call your gastroenterologist to clarify.  If you requested that your care partner not be given the details of your procedure findings, then the procedure report has been included in a sealed envelope for you to review at your convenience later.  YOU SHOULD EXPECT: Some feelings of bloating in the abdomen. Passage of more gas than usual.  Walking can help get rid of the air that was put into your GI tract during the procedure and reduce the bloating. If you had a lower endoscopy (such as a colonoscopy or flexible sigmoidoscopy) you may notice spotting of blood in your stool or on the toilet paper. If you underwent a bowel prep for your procedure, you may not have a normal bowel movement for a few days.  Please Note:  You might notice some irritation and congestion in your nose or some drainage.  This is from the oxygen used during your procedure.  There is no need for concern and it should clear up in a day or so.  SYMPTOMS TO REPORT IMMEDIATELY:   Following lower endoscopy (colonoscopy or flexible sigmoidoscopy):  Excessive amounts of blood in the stool  Significant tenderness or worsening of abdominal pains  Swelling of the abdomen that is new, acute  Fever of 100F or higher  For urgent or emergent issues, a gastroenterologist can be reached at any hour by calling [phone].   DIET:  We do recommend a small meal at first, but then you may proceed to your regular diet.  Drink plenty of fluids but you should avoid alcoholic beverages for 24 hours.  ACTIVITY:  You should plan to take it easy for the rest of today and you should NOT DRIVE  or use heavy machinery until tomorrow (because of the sedation medicines used during the test).    FOLLOW UP: Our staff will call the number listed on your records 48-72 hours following your procedure to check on you and address any questions or concerns that you may have regarding the information given to you following your procedure. If we do not reach you, we will leave a message.  We will attempt to reach you two times.  During this call, we will ask if you have developed any symptoms of COVID 19. If you develop any symptoms (ie: fever, flu-like symptoms, shortness of breath, cough etc.) before then, please call [phone].  If you test positive for Covid 19 in the 2 weeks post procedure, please call and report this information to [place].    If any biopsies were taken you will be contacted by phone or by letter within the next 1-3 weeks.  Please call us at [phone] if you have not heard about the biopsies in 3 weeks.    SIGNATURES/CONFIDENTIALITY: You and/or your care partner have signed paperwork which will be entered into your electronic medical record.  These signatures attest to the fact that that the information above on your After Visit Summary has been reviewed and is understood.  Full responsibility of the confidentiality of this discharge information lies with you and/or your care-partner.

## 2019-05-21 ENCOUNTER — Encounter: Payer: Self-pay | Admitting: Internal Medicine

## 2019-05-21 ENCOUNTER — Telehealth: Payer: Self-pay

## 2019-05-21 NOTE — Telephone Encounter (Signed)
  Follow up Call-  Call back number 05/19/2019  Post procedure Call Back phone  # [phone]  Permission to leave phone message Yes  Some recent data might be hidden     Patient questions:  Do you have a fever, pain , or abdominal swelling? No. Pain Score  0 *  Have you tolerated food without any problems? Yes.    Have you been able to return to your normal activities? Yes.    Do you have any questions about your discharge instructions: Diet   No. Medications  No. Follow up visit  No.  Do you have questions or concerns about your Care? No.  Actions: * If pain score is 4 or above: No action needed, pain <4. 1. Have you developed a fever since your procedure? no  2.   Have you had an respiratory symptoms (SOB or cough) since your procedure? no  3.   Have you tested positive for COVID 19 since your procedure no  4.   Have you had any family members/close contacts diagnosed with the COVID 19 since your procedure?  no   If yes to any of these questions please route to Joylene John, RN and Alphonsa Gin, Therapist, sports.

## 2019-05-25 ENCOUNTER — Encounter: Payer: Self-pay | Admitting: Family Medicine

## 2019-06-02 ENCOUNTER — Encounter: Payer: Self-pay | Admitting: Family Medicine

## 2019-06-18 DIAGNOSIS — Z8546 Personal history of malignant neoplasm of prostate: Secondary | ICD-10-CM | POA: Diagnosis not present

## 2019-06-19 LAB — PSA: PSA: 5.76

## 2019-06-25 DIAGNOSIS — C61 Malignant neoplasm of prostate: Secondary | ICD-10-CM | POA: Diagnosis not present

## 2019-06-28 ENCOUNTER — Other Ambulatory Visit (HOSPITAL_COMMUNITY): Payer: Self-pay | Admitting: Urology

## 2019-06-28 DIAGNOSIS — C61 Malignant neoplasm of prostate: Secondary | ICD-10-CM

## 2019-06-30 ENCOUNTER — Encounter: Payer: Self-pay | Admitting: Family Medicine

## 2019-07-08 ENCOUNTER — Ambulatory Visit (HOSPITAL_COMMUNITY)
Admission: RE | Admit: 2019-07-08 | Discharge: 2019-07-08 | Disposition: A | Discharge status: Home / Self Care | Payer: PPO | Source: Ambulatory Visit | Attending: Urology | Admitting: Urology

## 2019-07-08 ENCOUNTER — Other Ambulatory Visit: Payer: Self-pay

## 2019-07-08 DIAGNOSIS — C61 Malignant neoplasm of prostate: Secondary | ICD-10-CM | POA: Diagnosis not present

## 2019-07-08 DIAGNOSIS — C775 Secondary and unspecified malignant neoplasm of intrapelvic lymph nodes: Secondary | ICD-10-CM | POA: Diagnosis not present

## 2019-07-08 MED ORDER — AXUMIN (FLUCICLOVINE F 18) INJECTION
10.7500 | Freq: Once | INTRAVENOUS | Status: AC
  Administered 2019-07-08: 15:00:00 10.75 via INTRAVENOUS

## 2019-07-19 DIAGNOSIS — C61 Malignant neoplasm of prostate: Secondary | ICD-10-CM | POA: Diagnosis not present

## 2019-07-19 DIAGNOSIS — C775 Secondary and unspecified malignant neoplasm of intrapelvic lymph nodes: Secondary | ICD-10-CM | POA: Diagnosis not present

## 2019-07-28 ENCOUNTER — Encounter: Payer: Self-pay | Admitting: Family Medicine

## 2019-08-10 ENCOUNTER — Encounter: Payer: Self-pay | Admitting: Family Medicine

## 2019-08-17 ENCOUNTER — Encounter: Payer: Self-pay | Admitting: Family Medicine

## 2019-10-18 ENCOUNTER — Encounter: Payer: Self-pay | Admitting: Orthopaedic Surgery

## 2019-10-18 ENCOUNTER — Other Ambulatory Visit: Payer: Self-pay

## 2019-10-18 ENCOUNTER — Ambulatory Visit (INDEPENDENT_AMBULATORY_CARE_PROVIDER_SITE_OTHER): Payer: PPO

## 2019-10-18 ENCOUNTER — Ambulatory Visit (INDEPENDENT_AMBULATORY_CARE_PROVIDER_SITE_OTHER): Payer: PPO | Admitting: Orthopaedic Surgery

## 2019-10-18 DIAGNOSIS — G8929 Other chronic pain: Secondary | ICD-10-CM

## 2019-10-18 DIAGNOSIS — M25561 Pain in right knee: Secondary | ICD-10-CM

## 2019-10-18 DIAGNOSIS — Z96642 Presence of left artificial hip joint: Secondary | ICD-10-CM

## 2019-10-18 MED ORDER — LIDOCAINE HCL 1 % IJ SOLN
3.0000 mL | INTRAMUSCULAR | Status: AC | PRN
  Administered 2019-10-18: 09:00:00 3 mL

## 2019-10-18 MED ORDER — METHYLPREDNISOLONE ACETATE 40 MG/ML IJ SUSP
40.0000 mg | INTRAMUSCULAR | Status: AC | PRN
  Administered 2019-10-18: 09:00:00 40 mg via INTRA_ARTICULAR

## 2019-10-18 NOTE — Progress Notes (Signed)
Office Visit Note   Patient: [name]           Date of Birth: 06-May-1946           MRN: [id number] Visit Date: 10/18/2019              Requested by: Tammi Sou, MD [address] [place],  [place] [address] PCP: Tammi Sou, MD   Assessment & Plan: Visit Diagnoses:  1. History of revision of total replacement of left hip joint   2. Chronic pain of right knee     Plan: Both he and I feel that is appropriate to try a steroid injection in his right knee.  He understands the risk and benefits of these injections and tolerated well.  His hip is doing well.  All question concerns were answered addressed.  We do not need to see him back for 1 year.  At that visit I would like just an AP and lateral of his left hip.  We do not need to see the entire pelvis and this can be done supine.  If there are any issues prior to then he will let us know.  Follow-Up Instructions: Return in about 1 year (around 10/17/2020).   Orders:  Orders Placed This Encounter  Procedures  . Large Joint Inj  . XR HIP UNILAT W OR W/O PELVIS 1V LEFT   No orders of the defined types were placed in this encounter.     Procedures: Large Joint Inj: L knee on 10/18/2019 8:30 AM Indications: diagnostic evaluation and pain Details: 22 G 1.5 in needle, superolateral approach  Arthrogram: No  Medications: 3 mL lidocaine 1 %; 40 mg methylPREDNISolone acetate 40 MG/ML Outcome: tolerated well, no immediate complications Procedure, treatment alternatives, risks and benefits explained, specific risks discussed. Consent was given by the patient. Immediately prior to procedure a time out was called to verify the correct patient, procedure, equipment, support staff and site/side marked as required. Patient was prepped and draped in the usual sterile fashion.       Clinical Data: No additional findings.   Subjective: Chief Complaint  Patient presents with  . Left Hip - Follow-up  The patient  is well-known to [place].  He is about 3 years out from a revision arthroplasty of a failed left hip acetabular component that then became infected.  He ambulates using a walking cane.  Has been dealing with recurrence of prostate cancer but is just on oral chemotherapy agents for that and has been doing well.  He only has had pain during cold parts of the year.  He has been experiencing right knee pain recently.  It is just pain with ambulating.  HPI  Review of Systems He currently denies any headache, chest pain, shortness of breath, fever, chills, nausea, vomiting  Objective: Vital Signs: There were no vitals taken for this visit.  Physical Exam He is alert and orient x3 and in no acute distress Ortho Exam Examination of his left hip shows it moves smoothly and fluidly with no pain.  Examination of his right knee does show medial joint line tenderness and varus malalignment with good range of motion. Specialty Comments:  No specialty comments available.  Imaging: XR HIP UNILAT W OR W/O PELVIS 1V LEFT  Result Date: 10/18/2019 A low AP pelvis and lateral left hip shows a total hip revision arthroplasty with no complicating features.  The films are not changed over the last 6 months.  There is no complicating features that can be identified.    PMFS History: Patient Active Problem List   Diagnosis Date Noted  . Loosening of prosthetic hip ([place]) 09/08/2017  . 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 Pain in left hip 09/01/2017  . History of revision of total replacement of left hip joint 09/01/2017  . Encounter for surveillance of recalled total hip arthroplasty hardware ([place]) 06/07/2016  . Status post revision of total hip replacement 06/07/2016  . 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 Red blood cell abnormality 02/11/2016  . Elevated blood pressure reading without diagnosis of hypertension 07/01/2013  . Peripheral neuropathy 06/02/2013  .  Health maintenance examination 11/26/2011  . ELEVATED PROSTATE SPECIFIC ANTIGEN 05/21/2010  . Obesity, Class I, BMI 30-34.9 05/18/2010  . VENOUS INSUFFICIENCY, CHRONIC 05/18/2010  . OSTEOARTHRITIS, KNEES, BILATERAL 05/18/2010   Past Medical History:  Diagnosis Date  . Anterior dislocation of right shoulder 09/2013   Reduced in ED under sedation.  Dislocated posteriorly 2015, ? partial RC tear, has f/u planned with Dr. Marlou Sa--? surgery? possible plan for 2016.  [name] Blood donor, whole blood    Has donated approx 30 gallons in his lifetime  . Cancer [facility]) 2012   Prostate cancer   . Chronic left hip pain    Severe DJD.  THA 2008.  Left acetabular revision in 2017 AND 2019.  [name] Chronic pain syndrome    Candidate for spinal cord stimulator (03/2017)  . Chronic renal insufficiency, stage II (mild)   . Chronic venous insufficiency    +compression hose  . DDD (degenerative disc disease), lumbar 2017   Dr. Noe Gens ortho.  He is now wearing a new leg brace.  Has had ESI and will get another.  . Diverticulosis    +redundant colon  . DVT (deep venous thrombosis) ([place]) 06/14/2016   provoked, bilateral LL's; in post-op setting s/p hip surgery.  Xarelto x 18mo at full dosing, then 6 more months at 1/2 dosing, then stopped xarelto.    . Foot drop, left    s/p hip fracture 1969  . Hx of adenomatous colonic polyps    polypectomy 2007; 2012; 2015; 05/2019; recall 79yrs  . Hypercholesterolemia 02/2017   Recommended statin 02/10/17 but pt declined, wanted to do TLC trial first.  . Osteoarthritis    knees and hips primarily  . Post laminectomy syndrome    improved with spinal cord stim-->stim to be removed summer/fall 2019.  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 Stable PSA urol f/u thru 2018. 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  . 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  . Urge incontinence    vesicare helpful    Family History  Problem Relation Age of Onset  . Lung cancer Mother   . Heart disease Father   . Colon cancer Neg Hx   . Esophageal cancer Neg Hx   . Rectal cancer Neg Hx   . Stomach cancer Neg Hx   . Colon polyps Neg Hx     Past Surgical History:  Procedure Laterality Date  . ANTERIOR HIP REVISION Left 06/07/2016   Procedure: LEFT ANTERIOR HIP  ACETABULAR REVISION;  Surgeon: Mcarthur Rossetti, MD;  Location: [facility];  Service: Orthopedics;  Laterality: Left;  . ANTERIOR HIP REVISION Left 10/09/2017   Procedure: Left hip acetabular revision;  Surgeon: Mcarthur Rossetti, MD;  Location: [facility];  Service: Orthopedics;  Laterality: Left;  . arthroscopy left shoulder Left 07  . bilateral carpal tunnel release  2003  . BILATERAL CARPAL TUNNEL RELEASE    . COLONOSCOPY  2007;2012;2015;05/2019   04/12/2014 Tubular adenoma x 1; 05/2019 adenomatous polyp; recall 5 yrs.  . FRACTURE SURGERY Left   . JOINT REPLACEMENT Left    hip  . LUMBAR LAMINECTOMY/DECOMPRESSION MICRODISCECTOMY Left 03/28/2016   Procedure: Left Lumbar three-four Laminoforaminotomy;  Surgeon: Erline Levine, MD;  Location: [facility]UR[facility]S;  Service: Neurosurgery;  Laterality: Left;  left  . POLYPECTOMY    . RADIOACTIVE SEED IMPLANT  2012  . ROTATOR CUFF REPAIR  2006   right  . seed implant prostate  2012  . SHOULDER ARTHROSCOPY Left   . SPINAL CORD STIMULATOR IMPLANT  06/2017   Very helpful  . TOTAL HIP ARTHROPLASTY  2008   left (cobalt chrome)  . VASECTOMY     Social History   Occupational History  . Not on file  Tobacco Use  . Smoking status: Never Smoker  . Smokeless tobacco: Never Used  Substance and Sexual Activity  . Alcohol use: No  . Drug use: No  . Sexual activity: Not on file

## 2019-11-01 DIAGNOSIS — C61 Malignant neoplasm of prostate: Secondary | ICD-10-CM | POA: Diagnosis not present

## 2019-11-01 DIAGNOSIS — E559 Vitamin D deficiency, unspecified: Secondary | ICD-10-CM | POA: Diagnosis not present

## 2019-11-01 LAB — PSA: PSA: 0.21

## 2019-11-08 DIAGNOSIS — R3915 Urgency of urination: Secondary | ICD-10-CM | POA: Diagnosis not present

## 2019-11-08 DIAGNOSIS — C61 Malignant neoplasm of prostate: Secondary | ICD-10-CM | POA: Diagnosis not present

## 2019-11-08 DIAGNOSIS — C775 Secondary and unspecified malignant neoplasm of intrapelvic lymph nodes: Secondary | ICD-10-CM | POA: Diagnosis not present

## 2019-11-10 ENCOUNTER — Encounter: Payer: Self-pay | Admitting: Family Medicine

## 2019-11-23 ENCOUNTER — Encounter: Payer: Self-pay | Admitting: Family Medicine

## 2019-12-03 ENCOUNTER — Ambulatory Visit: Payer: PPO | Attending: Internal Medicine

## 2019-12-03 DIAGNOSIS — Z23 Encounter for immunization: Secondary | ICD-10-CM

## 2019-12-03 NOTE — Progress Notes (Signed)
   Covid-19 Vaccination Clinic  Name:  [name]    MRN: [id number] DOB: 1945-10-08  12/03/2019  Mr. [name] was observed post Covid-19 immunization for 15 minutes without incidence. He was provided with Vaccine Information Sheet and instruction to access the V-Safe system.   Mr. [name] was instructed to call 911 with any severe reactions post vaccine: [name] Difficulty breathing  . Swelling of your face and throat  . A fast heartbeat  . A bad rash all over your body  . Dizziness and weakness    Immunizations Administered    Name Date Dose VIS Date Route   Pfizer COVID-19 Vaccine 12/03/2019  8:25 AM 0.3 mL 09/17/2019 Intramuscular   Manufacturer: [place]   Lot: [id number]   [place]: [id number]

## 2019-12-28 ENCOUNTER — Ambulatory Visit: Payer: PPO | Attending: Internal Medicine

## 2019-12-28 DIAGNOSIS — Z23 Encounter for immunization: Secondary | ICD-10-CM

## 2019-12-28 NOTE — Progress Notes (Signed)
   Covid-19 Vaccination Clinic  Name:  ODELL CARRERO    MRN: [id number] DOB: Feb 18, 1946  12/28/2019  Mr. Devillier was observed post Covid-19 immunization for 15 minutes without incident. He was provided with Vaccine Information Sheet and instruction to access the V-Safe system.   Mr. Lichtenstein was instructed to call 911 with any severe reactions post vaccine: [name] Difficulty breathing  . Swelling of face and throat  . A fast heartbeat  . A bad rash all over body  . Dizziness and weakness   Immunizations Administered    Name Date Dose VIS Date Route   Pfizer COVID-19 Vaccine 12/28/2019  9:38 AM 0.3 mL 09/17/2019 Intramuscular   Manufacturer: [place]   Lot: [id number]   [place]: [id number]

## 2020-01-06 ENCOUNTER — Telehealth: Payer: Self-pay | Admitting: Orthopaedic Surgery

## 2020-01-11 ENCOUNTER — Encounter: Payer: Self-pay | Admitting: Family Medicine

## 2020-01-11 ENCOUNTER — Other Ambulatory Visit: Payer: Self-pay

## 2020-01-11 ENCOUNTER — Ambulatory Visit: Payer: Self-pay

## 2020-01-11 ENCOUNTER — Ambulatory Visit: Payer: PPO | Admitting: Family Medicine

## 2020-01-11 DIAGNOSIS — M25559 Pain in unspecified hip: Secondary | ICD-10-CM | POA: Diagnosis not present

## 2020-01-11 MED ORDER — PREDNISONE 10 MG PO TABS: ORAL_TABLET | ORAL | 0 refills | Status: DC

## 2020-01-11 NOTE — Progress Notes (Signed)
Office Visit Note   Patient: [name]           Date of Birth: Dec 13, 1945           MRN: [id number] Visit Date: 01/11/2020 Requested by: Tammi Sou, MD [address] [place],  [place] [address] PCP: Tammi Sou, MD  Subjective: Chief Complaint  Patient presents with  . Left Hip - Pain    Persistent pain in the left hip (lateral). Hurts to put full weight on the leg. Wakes him if he rolls over on the left side at night. Ambulating with cane, since last revision 2019 (for balance).    HPI: She is here with worsening left hip pain.  Original replacement was about 11 or 12 years ago.  He had revision of the acetabular component 8 years after that, and then again in 2019.  He has done pretty well for the most part, he uses a cane for support but in the past 2 or 3 months without injury he has had gradually worsening lateral hip pain.  He cannot take NSAIDs due to anticoagulant use.  Tylenol does not seem to be helping.  Pain is primarily with weightbearing.  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  He denies any back pain.              ROS: No fevers or chills.  Appetite is normal.  All other systems were reviewed and are negative.  Objective: Vital Signs: There were no vitals taken for this visit.  Physical Exam:  General:  Alert and oriented, in no acute distress. Pulm:  Breathing unlabored. Psy:  Normal mood, congruent affect. Skin: No erythema. Left hip: He has good passive motion of the joint, smooth with no crepitus.  He is tender over the greater trochanter.  Imaging: X-rays left hip compared with x-rays from January show stable position alignment of the prosthesis and surgical hardware with no sign of loosening or infection.  Assessment & Plan: 1.  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 -We will draw CBC with differential, sed rate and C-reactive  protein. -We will treat with a prednisone taper. -If symptoms persist could try physical therapy, or discussed with Dr. Ninfa Linden regarding possibility of greater trochanter injection.     Procedures: No procedures performed  No notes on file     PMFS History: Patient Active Problem List   Diagnosis Date Noted  . Loosening of prosthetic hip ([place]) 09/08/2017  . 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 Pain in left hip 09/01/2017  . History of revision of total replacement of left hip joint 09/01/2017  . Encounter for surveillance of recalled total hip arthroplasty hardware ([place]) 06/07/2016  . Status post revision of total hip replacement 06/07/2016  . 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 Red blood cell abnormality 02/11/2016  . Elevated blood pressure reading without diagnosis of hypertension 07/01/2013  . Peripheral neuropathy 06/02/2013  . Health maintenance examination 11/26/2011  . ELEVATED PROSTATE SPECIFIC ANTIGEN 05/21/2010  . Obesity, Class I, BMI 30-34.9 05/18/2010  . VENOUS INSUFFICIENCY, CHRONIC 05/18/2010  . OSTEOARTHRITIS, KNEES, BILATERAL 05/18/2010   Past Medical History:  Diagnosis Date  . Anterior dislocation of right shoulder 09/2013   Reduced in ED under sedation.  Dislocated posteriorly 2015, ? partial RC tear, has f/u planned with Dr. Marlou Sa--? surgery? possible plan for 2016.  [name]  Blood donor, whole blood    Has donated approx 30 gallons in his lifetime  . Cancer [facility]) 2012   Prostate cancer   . Chronic left hip pain    Severe DJD.  THA 2008.  Left acetabular revision in 2017 AND 2019.  [name] Chronic pain syndrome    Candidate for spinal cord stimulator (03/2017)  . Chronic renal insufficiency, stage II (mild)   . Chronic venous insufficiency    +compression hose  . DDD (degenerative disc disease), lumbar 2017   Dr. Noe Gens ortho.  He is now wearing a new leg brace.  Has had ESI and will get another.  .  Diverticulosis    +redundant colon  . DVT (deep venous thrombosis) ([place]) 06/14/2016   provoked, bilateral LL's; in post-op setting s/p hip surgery.  Xarelto x 43mo at full dosing, then 6 more months at 1/2 dosing, then stopped xarelto.    . Foot drop, left    s/p hip fracture 1969  . Hx of adenomatous colonic polyps    polypectomy 2007; 2012; 2015; 05/2019; recall 38yrs  . Hypercholesterolemia 02/2017   Recommended statin 02/10/17 but pt declined, wanted to do TLC trial first.  . Osteoarthritis    knees and hips primarily  . Post laminectomy syndrome    improved with spinal cord stim-->stim to be removed summer/fall 2019.  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 Stable PSA urol f/u thru 2018. 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  . 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  . Urge incontinence    vesicare helpful    Family History  Problem Relation Age of Onset  . Lung cancer Mother   . Heart disease Father   . Colon cancer Neg Hx   . Esophageal cancer Neg Hx   . Rectal cancer Neg Hx   . Stomach cancer Neg Hx   . Colon polyps Neg Hx     Past Surgical History:  Procedure Laterality Date  . ANTERIOR HIP REVISION Left 06/07/2016   Procedure: LEFT ANTERIOR HIP ACETABULAR REVISION;  Surgeon: Mcarthur Rossetti, MD;  [facility];  Service: Orthopedics;  Laterality: Left;  . ANTERIOR HIP REVISION Left 10/09/2017   Procedure: Left hip acetabular revision;  Surgeon: Mcarthur Rossetti, MD;  [facility];  Service: Orthopedics;  Laterality: Left;  . arthroscopy left shoulder Left 07  . bilateral carpal tunnel release  2003  . BILATERAL CARPAL TUNNEL RELEASE    . COLONOSCOPY   2007;2012;2015;05/2019   04/12/2014 Tubular adenoma x 1; 05/2019 adenomatous polyp; recall 5 yrs.  . FRACTURE SURGERY Left   . JOINT REPLACEMENT Left    hip  . LUMBAR LAMINECTOMY/DECOMPRESSION MICRODISCECTOMY Left 03/28/2016   Procedure: Left Lumbar three-four Laminoforaminotomy;  Surgeon: Erline Levine, MD;  Location: [place] NEURO OR[facility];  Service: Neurosurgery;  Laterality: Left;  left  . POLYPECTOMY    . RADIOACTIVE SEED IMPLANT  2012  . ROTATOR CUFF REPAIR  2006   right  . seed implant prostate  2012  . SHOULDER ARTHROSCOPY Left   . SPINAL CORD STIMULATOR IMPLANT  06/2017   Very helpful  . TOTAL HIP ARTHROPLASTY  2008  left (cobalt chrome)  . VASECTOMY     Social History   Occupational History  . Not on file  Tobacco Use  . Smoking status: Never Smoker  . Smokeless tobacco: Never Used  Substance and Sexual Activity  . Alcohol use: No  . Drug use: No  . Sexual activity: Not on file

## 2020-01-12 ENCOUNTER — Telehealth: Payer: Self-pay | Admitting: Family Medicine

## 2020-01-12 LAB — C-REACTIVE PROTEIN: CRP: 34.8 mg/L — ABNORMAL HIGH (ref <8.0)

## 2020-01-12 LAB — CBC WITH DIFFERENTIAL/PLATELET
Absolute Monocytes: 527 cells/uL (ref 200–950)
Basophils Absolute: 19 cells/uL (ref 0–200)
Basophils Relative: 0.3 %
Eosinophils Absolute: 143 cells/uL (ref 15–500)
Eosinophils Relative: 2.3 %
HCT: 46 % (ref 38.5–50.0)
Hemoglobin: 15.3 g/dL (ref 13.2–17.1)
Lymphs Abs: 1358 cells/uL (ref 850–3900)
MCH: 29.4 pg (ref 27.0–33.0)
MCHC: 33.3 g/dL (ref 32.0–36.0)
MCV: 88.5 fL (ref 80.0–100.0)
MPV: 9.8 fL (ref 7.5–12.5)
Monocytes Relative: 8.5 %
Neutro Abs: 4154 cells/uL (ref 1500–7800)
Neutrophils Relative %: 67 %
Platelets: 310 10*3/uL (ref 140–400)
RBC: 5.2 10*6/uL (ref 4.20–5.80)
RDW: 13.1 % (ref 11.0–15.0)
Total Lymphocyte: 21.9 %
WBC: 6.2 10*3/uL (ref 3.8–10.8)

## 2020-01-12 LAB — SEDIMENTATION RATE: Sed Rate: 38 mm/h — ABNORMAL HIGH (ref 0–20)

## 2020-01-12 NOTE — Telephone Encounter (Signed)
Thanks Mike!

## 2020-01-12 NOTE — Telephone Encounter (Signed)
CBC is normal.  CRP and Sed Rate are both very high.  This can be due to inflammation, but also could be due to infection.  I will notify Dr. Ninfa Linden of results.

## 2020-01-19 ENCOUNTER — Ambulatory Visit: Payer: PPO | Admitting: Orthopaedic Surgery

## 2020-01-19 ENCOUNTER — Other Ambulatory Visit: Payer: Self-pay

## 2020-01-19 DIAGNOSIS — M25452 Effusion, left hip: Secondary | ICD-10-CM | POA: Diagnosis not present

## 2020-01-19 DIAGNOSIS — M25559 Pain in unspecified hip: Secondary | ICD-10-CM | POA: Diagnosis not present

## 2020-01-19 DIAGNOSIS — Z96642 Presence of left artificial hip joint: Secondary | ICD-10-CM

## 2020-01-19 MED ORDER — HYDROCODONE-ACETAMINOPHEN 7.5-325 MG PO TABS: 1.0000 | ORAL_TABLET | Freq: Four times a day (QID) | ORAL | 0 refills | Status: DC | PRN

## 2020-01-19 NOTE — Progress Notes (Signed)
[name] comes in today for continued evaluation and treatment of worsening left hip pain.  He has a history of a total hip arthroplasty that failed and eventually needed a significant revision arthroplasty especially of the acetabular component.  He had dealt with an infection at one point.  He has been off antibiotics for a long period time.  He also has a history of prostate cancer.  His right hip is been slowly getting worse in terms of pain and now it hurts with weightbearing as well.  X-rays have shown a complex multiholed with multiple screws acetabular component.  On exam I can put his hip the range of motion but is somewhat painful to him.  There is no gross evidence of infection on clinical exam.  However, recent labs show a sed rate of 38, a white blood cell count of 6.2 peripherally, and a CRP that is elevated to 34.8.  At this point I would like to hopefully obtain an aspiration of his left hip under direct fluoroscopy by Dr. Ernestina Patches.  If any fluid is found this can be sent for Gram stain and culture.  The patient is currently off antibiotics and has been off of them for a long period of time.  I would also like to have a MRI obtained of his left hip to evaluate for any type of fluid collections or other findings that could be indicative of a hip infection.  We will hold off on any antibiotics until we have these results.  I will least put him on exercise hydrocodone to help alleviate his pain.  He is still afebrile but if things worsen he will let us know.  We will get him back in the office once we have these results.  Once I do have any aspiration that may show infection we can put him on antibiotics as well while we wait for the results.  All questions and concerns were answered and addressed.

## 2020-01-20 ENCOUNTER — Other Ambulatory Visit: Payer: Self-pay

## 2020-01-20 DIAGNOSIS — Z96642 Presence of left artificial hip joint: Secondary | ICD-10-CM

## 2020-01-24 ENCOUNTER — Encounter: Payer: Self-pay | Admitting: Physical Medicine and Rehabilitation

## 2020-01-24 ENCOUNTER — Other Ambulatory Visit: Payer: Self-pay

## 2020-01-24 ENCOUNTER — Ambulatory Visit: Payer: Self-pay

## 2020-01-24 ENCOUNTER — Ambulatory Visit (INDEPENDENT_AMBULATORY_CARE_PROVIDER_SITE_OTHER): Payer: PPO | Admitting: Physical Medicine and Rehabilitation

## 2020-01-24 DIAGNOSIS — M25552 Pain in left hip: Secondary | ICD-10-CM | POA: Diagnosis not present

## 2020-01-24 NOTE — Progress Notes (Signed)
.  Numeric Pain Rating Scale and Functional Assessment Average Pain 5   In the last MONTH (on 0-10 scale) has pain interfered with the following?  1. General activity like being  able to carry out your everyday physical activities such as walking, climbing stairs, carrying groceries, or moving a chair?  Rating(8)   -Dye Allergies.

## 2020-01-24 NOTE — Progress Notes (Signed)
   [name] - 74 y.o. male MRN [id number]  Date of birth: 02/15/1946  Office Visit Note: Visit Date: 01/24/2020 PCP: Tammi Sou, MD Referred by: Tammi Sou, MD  Subjective: Chief Complaint  Patient presents with  . Left Hip - Pain   HPI:  [name] is a 74 y.o. male who comes in today For planned left total hip aspiration at the request of Dr. Jean Rosenthal.  Patient having more posterior lateral hip pain no real pain in the groin.  He said this started about 3 months ago.  Is worse with walking and twisting.  His pain is 5 out of 10.  ROS Otherwise per HPI.  Assessment & Plan: Visit Diagnoses:  1. Pain in left hip     Plan: Findings:  We did use a 20-gauge Touhy needle with fluoroscopic guidance and then across the anterior part of the femoral neck as well as under the acetabulum there was no fluid aspirated.  We did irrigate with 10 cc of normal saline and still really no fluid aspirated.    Meds & Orders: No orders of the defined types were placed in this encounter.   Orders Placed This Encounter  Procedures  . Large Joint Inj  . XR C-ARM NO REPORT    Follow-up: Return for Jean Rosenthal, MD.   Procedures: Large Joint Inj: L hip joint on 01/24/2020 7:11 PM Indications: diagnostic evaluation and pain Details: (20 G) 3.5 in needle, fluoroscopy-guided anterior approach  Arthrogram: No  Outcome: tolerated well, no immediate complications  There was excellent flow of contrast showing flow under the acetabular. We did use a 20-gauge Touhy needle with fluoroscopic guidance and then across the anterior part of the femoral neck as well as under the acetabulum there was no fluid aspirated.  We did irrigate with 10 cc of normal saline and still really no fluid aspirated. Procedure, treatment alternatives, risks and benefits explained, specific risks discussed. Consent was given by the patient. Immediately prior to procedure a time out was  called to verify the correct patient, procedure, equipment, support staff and site/side marked as required. Patient was prepped and draped in the usual sterile fashion.      No notes on file   Clinical History: IMPRESSION: 1. Loosening and migration of the acetabular component of the left total hip prosthesis, now with a protrusio deformity and cortical defect in the medial wall of the acetabulum. 2. Abnormal soft tissue in the left hip joint. 3. Multiple bone erosions of the acetabulum. 4. 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   Electronically Signed   By: Lorriane Shire M.D.   On: 09/05/2017 14:11     Objective:  VS:  HT:    WT:   BMI:     BP:   HR: bpm  TEMP: ( )  RESP:  Physical Exam  Ortho Exam Imaging: XR C-ARM NO REPORT  Result Date: 01/24/2020 Please see Notes tab for imaging impression.

## 2020-02-01 ENCOUNTER — Ambulatory Visit: Payer: PPO | Admitting: Orthopaedic Surgery

## 2020-02-03 ENCOUNTER — Ambulatory Visit
Admission: RE | Admit: 2020-02-03 | Discharge: 2020-02-03 | Disposition: A | Discharge status: Home / Self Care | Payer: PPO | Source: Ambulatory Visit | Attending: Orthopaedic Surgery | Admitting: Orthopaedic Surgery

## 2020-02-03 ENCOUNTER — Other Ambulatory Visit: Payer: Self-pay

## 2020-02-03 DIAGNOSIS — Z96642 Presence of left artificial hip joint: Secondary | ICD-10-CM

## 2020-02-03 DIAGNOSIS — M25452 Effusion, left hip: Secondary | ICD-10-CM | POA: Diagnosis not present

## 2020-02-07 ENCOUNTER — Other Ambulatory Visit: Payer: Self-pay | Admitting: Physician Assistant

## 2020-02-07 ENCOUNTER — Other Ambulatory Visit: Payer: Self-pay

## 2020-02-07 ENCOUNTER — Ambulatory Visit: Payer: PPO | Admitting: Orthopaedic Surgery

## 2020-02-07 DIAGNOSIS — Z96642 Presence of left artificial hip joint: Secondary | ICD-10-CM | POA: Diagnosis not present

## 2020-02-07 DIAGNOSIS — T8452XD Infection and inflammatory reaction due to internal left hip prosthesis, subsequent encounter: Secondary | ICD-10-CM | POA: Diagnosis not present

## 2020-02-07 NOTE — Progress Notes (Signed)
The patient is well-known to me.  He has had multiple surgeries on his left hip he did originally was the result of a failed ASR acetabular component that was placed by one of my colleagues years ago.  After that felt we had to revise that hip.  He then had a failure of that hip component and we revise it again.  There was question of whether he had infection he was on long-term antibiotics for a while but he has been off antibiotics for many years now.  He then developed recent worsening left hip pain.  Infectious labs including a white blood cell count, sed rate and CRP were all elevated.  I sent him to Dr. Ernestina Patches for an aspiration of his left hip joint under fluoroscopy.  There was no fluid that was encountered in the hip joint itself.  I also sent him for MRI.  The MRI does show a very large fluid collection around the hip joint that is certainly worrisome for chronic infection versus less likely metallosis.  The patient does report a decrease in his hip pain since that aspiration.  He denies any fever or chills or nausea or vomiting.  I spoke to him in length about the fact that given his MRI findings of the fluid collection is large combined with his elevation and acute phase reactants, I would recommend an irrigation debridement of his hip joint with further treatment of the joint interoperative as deemed necessary including a polyliner and hip ball exchange.  This is worrisome for the potential for chronic infection and he will likely need to be on suppressive antibiotics.  I had a long thorough discussion with him about this as well as the risk and benefits of surgery.  He is not pleased about having to proceed with surgery such as this but does understand why we are recommending this treatment plan.  We will work on getting him scheduled for the end of this week.  We will then see him back in 2 weeks postoperative.  All questions and concerns were answered and addressed.

## 2020-02-08 ENCOUNTER — Encounter (HOSPITAL_COMMUNITY): Payer: Self-pay

## 2020-02-08 ENCOUNTER — Inpatient Hospital Stay (HOSPITAL_COMMUNITY): Admission: RE | Admit: 2020-02-08 | Payer: PPO | Source: Ambulatory Visit

## 2020-02-08 ENCOUNTER — Encounter (HOSPITAL_COMMUNITY)
Admission: RE | Admit: 2020-02-08 | Discharge: 2020-02-08 | Disposition: A | Discharge status: Home / Self Care | Payer: PPO | Source: Ambulatory Visit | Attending: Orthopaedic Surgery | Admitting: Orthopaedic Surgery

## 2020-02-08 ENCOUNTER — Other Ambulatory Visit: Payer: Self-pay

## 2020-02-08 ENCOUNTER — Other Ambulatory Visit (HOSPITAL_COMMUNITY)
Admission: RE | Admit: 2020-02-08 | Discharge: 2020-02-08 | Disposition: A | Discharge status: Home / Self Care | Payer: PPO | Source: Ambulatory Visit | Attending: Orthopaedic Surgery | Admitting: Orthopaedic Surgery

## 2020-02-08 DIAGNOSIS — N182 Chronic kidney disease, stage 2 (mild): Secondary | ICD-10-CM | POA: Diagnosis present

## 2020-02-08 DIAGNOSIS — M25552 Pain in left hip: Secondary | ICD-10-CM | POA: Diagnosis present

## 2020-02-08 DIAGNOSIS — N19 Unspecified kidney failure: Secondary | ICD-10-CM | POA: Diagnosis not present

## 2020-02-08 DIAGNOSIS — G894 Chronic pain syndrome: Secondary | ICD-10-CM | POA: Diagnosis present

## 2020-02-08 DIAGNOSIS — Z86718 Personal history of other venous thrombosis and embolism: Secondary | ICD-10-CM | POA: Diagnosis not present

## 2020-02-08 DIAGNOSIS — T8452XD Infection and inflammatory reaction due to internal left hip prosthesis, subsequent encounter: Secondary | ICD-10-CM | POA: Diagnosis not present

## 2020-02-08 DIAGNOSIS — Y792 Prosthetic and other implants, materials and accessory orthopedic devices associated with adverse incidents: Secondary | ICD-10-CM | POA: Diagnosis present

## 2020-02-08 DIAGNOSIS — Z6831 Body mass index (BMI) 31.0-31.9, adult: Secondary | ICD-10-CM | POA: Diagnosis not present

## 2020-02-08 DIAGNOSIS — I739 Peripheral vascular disease, unspecified: Secondary | ICD-10-CM | POA: Diagnosis not present

## 2020-02-08 DIAGNOSIS — B9561 Methicillin susceptible Staphylococcus aureus infection as the cause of diseases classified elsewhere: Secondary | ICD-10-CM | POA: Diagnosis present

## 2020-02-08 DIAGNOSIS — T8459XD Infection and inflammatory reaction due to other internal joint prosthesis, subsequent encounter: Secondary | ICD-10-CM | POA: Diagnosis not present

## 2020-02-08 DIAGNOSIS — I872 Venous insufficiency (chronic) (peripheral): Secondary | ICD-10-CM | POA: Diagnosis present

## 2020-02-08 DIAGNOSIS — E669 Obesity, unspecified: Secondary | ICD-10-CM | POA: Diagnosis present

## 2020-02-08 DIAGNOSIS — M00052 Staphylococcal arthritis, left hip: Secondary | ICD-10-CM | POA: Diagnosis present

## 2020-02-08 DIAGNOSIS — M1990 Unspecified osteoarthritis, unspecified site: Secondary | ICD-10-CM | POA: Diagnosis present

## 2020-02-08 DIAGNOSIS — Z9109 Other allergy status, other than to drugs and biological substances: Secondary | ICD-10-CM | POA: Diagnosis not present

## 2020-02-08 DIAGNOSIS — Z20822 Contact with and (suspected) exposure to covid-19: Secondary | ICD-10-CM | POA: Diagnosis present

## 2020-02-08 DIAGNOSIS — M48062 Spinal stenosis, lumbar region with neurogenic claudication: Secondary | ICD-10-CM | POA: Diagnosis not present

## 2020-02-08 DIAGNOSIS — Z96642 Presence of left artificial hip joint: Secondary | ICD-10-CM | POA: Diagnosis present

## 2020-02-08 DIAGNOSIS — Z01812 Encounter for preprocedural laboratory examination: Secondary | ICD-10-CM | POA: Insufficient documentation

## 2020-02-08 DIAGNOSIS — A4901 Methicillin susceptible Staphylococcus aureus infection, unspecified site: Secondary | ICD-10-CM | POA: Diagnosis not present

## 2020-02-08 DIAGNOSIS — Y838 Other surgical procedures as the cause of abnormal reaction of the patient, or of later complication, without mention of misadventure at the time of the procedure: Secondary | ICD-10-CM | POA: Diagnosis present

## 2020-02-08 DIAGNOSIS — Z8546 Personal history of malignant neoplasm of prostate: Secondary | ICD-10-CM | POA: Diagnosis not present

## 2020-02-08 DIAGNOSIS — Z86010 Personal history of colonic polyps: Secondary | ICD-10-CM | POA: Diagnosis not present

## 2020-02-08 DIAGNOSIS — E7800 Pure hypercholesterolemia, unspecified: Secondary | ICD-10-CM | POA: Diagnosis present

## 2020-02-08 DIAGNOSIS — T8452XA Infection and inflammatory reaction due to internal left hip prosthesis, initial encounter: Secondary | ICD-10-CM | POA: Diagnosis present

## 2020-02-08 LAB — SARS CORONAVIRUS 2 (TAT 6-24 HRS): SARS Coronavirus 2: NEGATIVE

## 2020-02-08 NOTE — Patient Instructions (Addendum)
DUE TO COVID-19 ONLY ONE VISITOR IS ALLOWED TO COME WITH YOU AND STAY IN THE WAITING ROOM ONLY DURING PRE OP AND PROCEDURE DAY OF SURGERY. THE 2 VISITORS MAY VISIT WITH YOU AFTER SURGERY IN YOUR PRIVATE ROOM DURING VISITING HOURS ONLY!   ONCE YOUR COVID TEST IS COMPLETED, PLEASE BEGIN THE QUARANTINE INSTRUCTIONS AS OUTLINED IN YOUR HANDOUT.                [name]   Your procedure is scheduled on: 02/11/20   Report to [facility] Main  Entrance   Report to admitting at  6:00 AM     Call this number if you have problems the morning of surgery [phone]    . BRUSH YOUR TEETH MORNING OF SURGERY AND RINSE YOUR MOUTH OUT, NO CHEWING GUM CANDY OR MINTS.   Do not eat food After Midnight.   YOU MAY HAVE CLEAR LIQUIDS FROM MIDNIGHT UNTIL 5:30 AM.   At 5:30 AM Please finish the prescribed Pre-Surgery  drink  . Nothing by mouth after you finish the  drink !   Take these medicines the morning of surgery with A SIP OF WATER: Trospium  Cl , Erleada                                 You may not have any metal on your body including              piercings  Do not wear jewelry,  lotions, powders or  deodorant                       Men may shave face and neck.   Do not bring valuables to the hospital. [name].  Contacts, dentures or bridgework may not be worn into surgery.  .      Name and phone number of your driver:  Special Instructions: N/A              Please read over the following fact sheets you were given: _____________________________________________________________________             [facility] - Preparing for Surgery Before surgery, you can play an important role.   Because skin is not sterile, your skin needs to be as free of germs as possible .  You can reduce the number of germs on your skin by washing with CHG (chlorahexidine gluconate) soap before surgery.   CHG is an antiseptic cleaner which  kills germs and bonds with the skin to continue killing germs even after washing. Please DO NOT use if you have an allergy to CHG or antibacterial soaps.   If your skin becomes reddened/irritated stop using the CHG and inform your nurse when you arrive at Short Stay. .  You may shave your face/neck.  Please follow these instructions carefully:  1.  Shower with CHG Soap the night before surgery and the  morning of Surgery.  2.  If you choose to wash your hair, wash your hair first as usual with your  normal  shampoo.  3.  After you shampoo, rinse your hair and body thoroughly to remove the  shampoo.  4.  Use CHG as you would any other liquid soap.  You can apply chg directly  to the skin and wash                       Gently with a scrungie or clean washcloth.  5.  Apply the CHG Soap to your body ONLY FROM THE NECK DOWN.   Do not use on face/ open                           Wound or open sores. Avoid contact with eyes, ears mouth and genitals (private parts).                       Wash face,  Genitals (private parts) with your normal soap.             6.  Wash thoroughly, paying special attention to the area where your surgery  will be performed.  7.  Thoroughly rinse your body with warm water from the neck down.  8.  DO NOT shower/wash with your normal soap after using and rinsing off  the CHG Soap.             9.  Pat yourself dry with a clean towel.            10.  Wear clean pajamas.            11.  Place clean sheets on your bed the night of your first shower and do not  sleep with pets. Day of Surgery : Do not apply any lotions/deodorants the morning of surgery.  Please wear clean clothes to the hospital/surgery center.  FAILURE TO FOLLOW THESE INSTRUCTIONS MAY RESULT IN THE CANCELLATION OF YOUR SURGERY PATIENT SIGNATURE_________________________________  NURSE  SIGNATURE__________________________________  ________________________________________________________________________   [name]  An incentive spirometer is a tool that can help keep your lungs clear and active. This tool measures how well you are filling your lungs with each breath. Taking long deep breaths may help reverse or decrease the chance of developing breathing (pulmonary) problems (especially infection) following:  A long period of time when you are unable to move or be active. BEFORE THE PROCEDURE   If the spirometer includes an indicator to show your best effort, your nurse or respiratory therapist will set it to a desired goal.  If possible, sit up straight or lean slightly forward. Try not to slouch.  Hold the incentive spirometer in an upright position. INSTRUCTIONS FOR USE  1. Sit on the edge of your bed if possible, or sit up as far as you can in bed or on a chair. 2. Hold the incentive spirometer in an upright position. 3. Breathe out normally. 4. Place the mouthpiece in your mouth and seal your lips tightly around it. 5. Breathe in slowly and as deeply as possible, raising the piston or the ball toward the top of the column. 6. Hold your breath for 3-5 seconds or for as long as possible. Allow the piston or ball to fall to the bottom of the column. 7. Remove the mouthpiece from your mouth and breathe out normally. 8. Rest for a few seconds and repeat Steps 1 through 7 at least 10 times every 1-2 hours when you are awake. Take your time and take a few normal breaths between deep breaths. 9. The spirometer may include an indicator to show your best effort.  Use the indicator as a goal to work toward during each repetition. 10. After each set of 10 deep breaths, practice coughing to be sure your lungs are clear. If you have an incision (the cut made at the time of surgery), support your incision when coughing by placing a pillow or rolled up towels firmly  against it. Once you are able to get out of bed, walk around indoors and cough well. You may stop using the incentive spirometer when instructed by your caregiver.  RISKS AND COMPLICATIONS  Take your time so you do not get dizzy or light-headed.  If you are in pain, you may need to take or ask for pain medication before doing incentive spirometry. It is harder to take a deep breath if you are having pain. AFTER USE  Rest and breathe slowly and easily.  It can be helpful to keep track of a log of your progress. Your caregiver can provide you with a simple table to help with this. If you are using the spirometer at home, follow these instructions: [place] IF:   You are having difficultly using the spirometer.  You have trouble using the spirometer as often as instructed.  Your pain medication is not giving enough relief while using the spirometer.  You develop fever of 100.5 F (38.1 C) or higher. SEEK IMMEDIATE MEDICAL CARE IF:   You cough up bloody sputum that had not been present before.  You develop fever of 102 F (38.9 C) or greater.  You develop worsening pain at or near the incision site. MAKE SURE YOU:   Understand these instructions.  Will watch your condition.  Will get help right away if you are not doing well or get worse. Document Released: 02/03/2007 Document Revised: 12/16/2011 Document Reviewed: 04/06/2007 [facility] Patient Information 2014 [place], [place].   ________________________________________________________________________

## 2020-02-08 NOTE — Progress Notes (Signed)
PCP - Dr. Elijah Birk Cardiologist - none  Chest x-ray - no EKG - no Stress Test -no  ECHO - no Cardiac Cath -no   Sleep Study - no CPAP -   Fasting Blood Sugar - NA Checks Blood Sugar _____ times a day  Blood Thinner Instructions:ASA Aspirin Instructions:none Last Dose:5/4  Anesthesia review:   Patient denies shortness of breath, fever, cough and chest pain at PAT appointment  yes Patient verbalized understanding of instructions that were given to them at the PAT appointment. Patient was also instructed that they will need to review over the PAT instructions again at home before surgery. Yes Pt reports that he still has leads in his back from a stimulator but the unit was removed.

## 2020-02-09 ENCOUNTER — Encounter (HOSPITAL_COMMUNITY)
Admission: RE | Admit: 2020-02-09 | Discharge: 2020-02-09 | Disposition: A | Discharge status: Home / Self Care | Payer: PPO | Source: Ambulatory Visit | Attending: Orthopaedic Surgery | Admitting: Orthopaedic Surgery

## 2020-02-09 DIAGNOSIS — Z01812 Encounter for preprocedural laboratory examination: Secondary | ICD-10-CM | POA: Insufficient documentation

## 2020-02-09 LAB — CBC
HCT: 43.8 % (ref 39.0–52.0)
Hemoglobin: 13.9 g/dL (ref 13.0–17.0)
MCH: 29.4 pg (ref 26.0–34.0)
MCHC: 31.7 g/dL (ref 30.0–36.0)
MCV: 92.6 fL (ref 80.0–100.0)
Platelets: 325 10*3/uL (ref 150–400)
RBC: 4.73 MIL/uL (ref 4.22–5.81)
RDW: 13.9 % (ref 11.5–15.5)
WBC: 6.3 10*3/uL (ref 4.0–10.5)
nRBC: 0 % (ref 0.0–0.2)

## 2020-02-10 DIAGNOSIS — T8452XA Infection and inflammatory reaction due to internal left hip prosthesis, initial encounter: Secondary | ICD-10-CM | POA: Diagnosis present

## 2020-02-10 NOTE — H&P (Signed)
[name] is an 74 y.o. male.   Chief Complaint:  Left hip pain HPI: The patient is a very pleasant 74 year old gentleman well-known to me.  He has quite a complicated history as it relates to his left hip.  He originally had a left total hip arthroplasty in 2008 due to severe degenerative joint disease.  This was done by one of my colleagues.  The acetabular component was originally a Therapist, music component that failed catastrophically.  He then underwent a revision of the acetabular component in 2017 through an anterior approach.  That component eventually experienced a failure in the short-term with infection.  He was treated with antibiotics and revision of the isotope component was performed in 2019 with a very large multi hole and multiscrew revision a steroid component.  He had then been on antibiotics for long period of time.  His residual infection isolated at 2 different occasions was sensitive staph aureus.  He had done well for a long period of time and then recently developed left hip pain.  His peripheral white blood cell count is normal but his sed rate and CRP were elevated.  An aspiration of his left hip and direct fluoroscopy was negative for any fluid collection.  However, a MRI was recently obtained of the left hip and is worrisome for a large fluid collection in the possibility of a chronic infection.  The patient currently reports that his hip pain is significantly better and he has been off antibiotics for a very long period of time.  However, given the findings of the MRI and his elevated acute phase reactants, I am recommending an irrigation debridement of his left hip so we can washout the soft tissues and assess for the possibility of a deep infection as well as obtain Gram stain and cultures with him having been off of antibiotics for a long period of time.  Past Medical History:  Diagnosis Date  . Anterior dislocation of right shoulder 09/2013   Reduced in ED under sedation.   Dislocated posteriorly 2015, ? partial RC tear, has f/u planned with Dr. Marlou Sa--? surgery? possible plan for 2016.  [name] Blood donor, whole blood    Has donated approx 30 gallons in his lifetime  . Cancer [facility]) 2012   Prostate cancer   . Chronic left hip pain    Severe DJD.  THA 2008.  Left acetabular revision in 2017 AND 2019.  [name] Chronic pain syndrome    Candidate for spinal cord stimulator (03/2017)  . Chronic renal insufficiency, stage II (mild)   . Chronic venous insufficiency    +compression hose  . DDD (degenerative disc disease), lumbar 2017   Dr. Noe Gens ortho.  He is now wearing a new leg brace.  Has had ESI and will get another.  . Diverticulosis    +redundant colon  . DVT (deep venous thrombosis) ([place]) 06/14/2016   provoked, bilateral LL's; in post-op setting s/p hip surgery.  Xarelto x 73mo at full dosing, then 6 more months at 1/2 dosing, then stopped xarelto.    . Foot drop, left    s/p hip fracture 1969  . Hx of adenomatous colonic polyps    polypectomy 2007; 2012; 2015; 05/2019; recall 33yrs  . Hypercholesterolemia 02/2017   Recommended statin 02/10/17 but pt declined, wanted to do TLC trial first.  . Osteoarthritis    knees and hips primarily  . Post laminectomy syndrome    improved with spinal cord stim-->stim to be removed summer/fall 2019.  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 Stable PSA urol f/u thru 2018. 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  . 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  . Urge incontinence    vesicare helpful    Past Surgical History:  Procedure Laterality Date  . ANTERIOR HIP REVISION Left 06/07/2016   Procedure: LEFT ANTERIOR HIP ACETABULAR REVISION;   Surgeon: Mcarthur Rossetti, MD;  Location: [facility] ORS;  Service: Orthopedics;  Laterality: Left;  . ANTERIOR HIP REVISION Left 10/09/2017   Procedure: Left hip acetabular revision;  Surgeon: Mcarthur Rossetti, MD;  Location: [facility] ORS;  Service: Orthopedics;  Laterality: Left;  . arthroscopy left shoulder Left 07  . bilateral carpal tunnel release  2003  . BILATERAL CARPAL TUNNEL RELEASE    . COLONOSCOPY  2007;2012;2015;05/2019   04/12/2014 Tubular adenoma x 1; 05/2019 adenomatous polyp; recall 5 yrs.  . FRACTURE SURGERY Left   . JOINT REPLACEMENT Left    hip  . LUMBAR LAMINECTOMY/DECOMPRESSION MICRODISCECTOMY Left 03/28/2016   Procedure: Left Lumbar three-four Laminoforaminotomy;  Surgeon: Erline Levine, MD;  Location: [place] [facility] NEURO ORS;  Service: Neurosurgery;  Laterality: Left;  left  . POLYPECTOMY    . RADIOACTIVE SEED IMPLANT  2012  . ROTATOR CUFF REPAIR  2006   right  . seed implant prostate  2012  . SHOULDER ARTHROSCOPY Left   . SPINAL CORD STIMULATOR IMPLANT  06/2017   Very helpful  . TOTAL HIP ARTHROPLASTY  2008   left ([place])  . VASECTOMY      Family History  Problem Relation Age of Onset  . Lung cancer Mother   . Heart disease Father   . Colon cancer Neg Hx   . Esophageal cancer Neg Hx   . Rectal cancer Neg Hx   . Stomach cancer Neg Hx   . Colon polyps Neg Hx    Social History:  reports that he has never smoked. He has never used smokeless tobacco. He reports that he does not drink alcohol or use drugs.  Allergies:  Allergies  Allergen Reactions  . Adhesive [Tape] Rash    Paper tape is ok    No medications prior to admission.    Results for orders placed or performed during the hospital encounter of 02/09/20 (from the past 48 hour(s))  CBC     Status: None   Collection Time: 02/09/20  2:34 PM  Result Value Ref Range   WBC 6.3 4.0 - 10.5 K/uL   RBC 4.73 4.22 - 5.81 MIL/uL   Hemoglobin 13.9 13.0 - 17.0 g/dL   HCT 43.8 39.0 - 52.0 %   MCV 92.6 80.0 -  100.0 fL   MCH 29.4 26.0 - 34.0 pg   MCHC 31.7 30.0 - 36.0 g/dL   RDW 13.9 11.5 - 15.5 %   Platelets 325 150 - 400 K/uL   nRBC 0.0 0.0 - 0.2 %    Comment: Performed at [facility], [place] [address] [place], [place] [address]   No results found.  Review of Systems  All other systems reviewed and are negative.   There were no vitals taken for this visit. Physical Exam  Constitutional: He  is oriented to person, place, and time. He appears well-developed and well-nourished.  HENT:  Head: Normocephalic and atraumatic.  Eyes: Pupils are equal, round, and reactive to light. EOM are normal.  Cardiovascular: Normal rate.  Respiratory: Effort normal.  GI: Soft.  Musculoskeletal:     Cervical back: Normal range of motion and neck supple.     Left hip: Tenderness present.  Neurological: He is alert and oriented to person, place, and time.  Skin: Skin is warm and dry.  Psychiatric: He has a normal mood and affect.     Assessment/Plan Questionable deep infection left total hip arthroplasty  Our plan is to proceed to surgery today for opening up his left hip and obtaining cultures of the tissue and fluid collection with Gram stain.  We would then thoroughly irrigate out the hip and likely placed deep antibiotic beads.  There is a possibility of changing out his polyethylene liner and femoral head as well.  That determination we made intraoperatively.  He understands that he may likely need a PICC line placement and long-term antibiotics IV as well as oral.  The risk and benefits of surgery have been explained in detail.  Mcarthur Rossetti, MD 02/10/2020, 7:34 PM

## 2020-02-10 NOTE — Anesthesia Preprocedure Evaluation (Addendum)
Anesthesia Evaluation  Patient identified by MRN, date of birth, ID band Patient awake    Reviewed: Allergy & Precautions, NPO status , Patient's Chart, lab work & pertinent test results  Airway Mallampati: II  TM Distance: >3 FB Neck ROM: Full    Dental no notable dental hx. (+) Teeth Intact, Dental Advisory Given, Caps   Pulmonary neg pulmonary ROS,    Pulmonary exam normal breath sounds clear to auscultation       Cardiovascular + Peripheral Vascular Disease and + DVT  negative cardio ROS Normal cardiovascular exam Rhythm:Regular Rate:Normal  DVT (deep venous thrombosis) ([place]) 06/14/2016 provoked, bilateral LL's; in post-op setting s/p hip surgery.  Xarelto x 55mo at full dosing, then 6 more months at 1/2 dosing, then stopped xarelto.      Neuro/Psych negative neurological ROS  negative psych ROS   GI/Hepatic negative GI ROS, Neg liver ROS,   Endo/Other  negative endocrine ROS  Renal/GU Renal InsufficiencyRenal diseasenegative Renal ROS  negative genitourinary   Musculoskeletal negative musculoskeletal ROS (+)   Abdominal   Peds negative pediatric ROS (+)  Hematology negative hematology ROS (+)   Anesthesia Other Findings   Reproductive/Obstetrics negative OB ROS                            Anesthesia Physical  Anesthesia Plan  ASA: III  Anesthesia Plan: General   Post-op Pain Management:    Induction: Intravenous  PONV Risk Score and Plan: 2 and Ondansetron, Dexamethasone and Treatment may vary due to age or medical condition  Airway Management Planned: Oral ETT and LMA  Additional Equipment:   Intra-op Plan:   Post-operative Plan: Extubation in OR  Informed Consent: I have reviewed the patients History and Physical, chart, labs and discussed the procedure including the risks, benefits and alternatives for the proposed anesthesia with the patient or authorized  representative who has indicated his/her understanding and acceptance.     Dental advisory given  Plan Discussed with: CRNA, Surgeon and Anesthesiologist  Anesthesia Plan Comments:        Anesthesia Quick Evaluation

## 2020-02-11 ENCOUNTER — Encounter (HOSPITAL_COMMUNITY)
Admission: RE | Disposition: A | Discharge status: Home / Self Care | Payer: Self-pay | Source: Ambulatory Visit | Attending: Orthopaedic Surgery

## 2020-02-11 ENCOUNTER — Inpatient Hospital Stay (HOSPITAL_COMMUNITY): Payer: PPO | Admitting: Certified Registered"

## 2020-02-11 ENCOUNTER — Encounter (HOSPITAL_COMMUNITY): Payer: Self-pay | Admitting: Orthopaedic Surgery

## 2020-02-11 ENCOUNTER — Other Ambulatory Visit: Payer: Self-pay

## 2020-02-11 ENCOUNTER — Inpatient Hospital Stay (HOSPITAL_COMMUNITY)
Admission: RE | Admit: 2020-02-11 | Discharge: 2020-02-15 | DRG: 481 | Disposition: A | Discharge status: Home / Self Care | Payer: PPO | Attending: Orthopaedic Surgery | Admitting: Orthopaedic Surgery

## 2020-02-11 DIAGNOSIS — T8452XA Infection and inflammatory reaction due to internal left hip prosthesis, initial encounter: Secondary | ICD-10-CM | POA: Diagnosis present

## 2020-02-11 DIAGNOSIS — E7800 Pure hypercholesterolemia, unspecified: Secondary | ICD-10-CM | POA: Diagnosis present

## 2020-02-11 DIAGNOSIS — Z8546 Personal history of malignant neoplasm of prostate: Secondary | ICD-10-CM | POA: Diagnosis not present

## 2020-02-11 DIAGNOSIS — B9561 Methicillin susceptible Staphylococcus aureus infection as the cause of diseases classified elsewhere: Secondary | ICD-10-CM | POA: Diagnosis present

## 2020-02-11 DIAGNOSIS — Y838 Other surgical procedures as the cause of abnormal reaction of the patient, or of later complication, without mention of misadventure at the time of the procedure: Secondary | ICD-10-CM | POA: Diagnosis present

## 2020-02-11 DIAGNOSIS — M00052 Staphylococcal arthritis, left hip: Secondary | ICD-10-CM | POA: Diagnosis present

## 2020-02-11 DIAGNOSIS — E669 Obesity, unspecified: Secondary | ICD-10-CM | POA: Diagnosis present

## 2020-02-11 DIAGNOSIS — I872 Venous insufficiency (chronic) (peripheral): Secondary | ICD-10-CM | POA: Diagnosis present

## 2020-02-11 DIAGNOSIS — M1990 Unspecified osteoarthritis, unspecified site: Secondary | ICD-10-CM | POA: Diagnosis present

## 2020-02-11 DIAGNOSIS — E66811 Obesity, class 1: Secondary | ICD-10-CM | POA: Diagnosis present

## 2020-02-11 DIAGNOSIS — Z20822 Contact with and (suspected) exposure to covid-19: Secondary | ICD-10-CM | POA: Diagnosis present

## 2020-02-11 DIAGNOSIS — Z9109 Other allergy status, other than to drugs and biological substances: Secondary | ICD-10-CM | POA: Diagnosis not present

## 2020-02-11 DIAGNOSIS — T8452XD Infection and inflammatory reaction due to internal left hip prosthesis, subsequent encounter: Secondary | ICD-10-CM

## 2020-02-11 DIAGNOSIS — Z6831 Body mass index (BMI) 31.0-31.9, adult: Secondary | ICD-10-CM

## 2020-02-11 DIAGNOSIS — Z96642 Presence of left artificial hip joint: Secondary | ICD-10-CM | POA: Diagnosis present

## 2020-02-11 DIAGNOSIS — T8459XD Infection and inflammatory reaction due to other internal joint prosthesis, subsequent encounter: Secondary | ICD-10-CM

## 2020-02-11 DIAGNOSIS — N182 Chronic kidney disease, stage 2 (mild): Secondary | ICD-10-CM | POA: Diagnosis present

## 2020-02-11 DIAGNOSIS — M1710 Unilateral primary osteoarthritis, unspecified knee: Secondary | ICD-10-CM | POA: Diagnosis present

## 2020-02-11 DIAGNOSIS — Z86718 Personal history of other venous thrombosis and embolism: Secondary | ICD-10-CM | POA: Diagnosis not present

## 2020-02-11 DIAGNOSIS — Z86010 Personal history of colonic polyps: Secondary | ICD-10-CM

## 2020-02-11 DIAGNOSIS — IMO0002 Reserved for concepts with insufficient information to code with codable children: Secondary | ICD-10-CM | POA: Diagnosis present

## 2020-02-11 DIAGNOSIS — Y792 Prosthetic and other implants, materials and accessory orthopedic devices associated with adverse incidents: Secondary | ICD-10-CM | POA: Diagnosis present

## 2020-02-11 DIAGNOSIS — G894 Chronic pain syndrome: Secondary | ICD-10-CM | POA: Diagnosis present

## 2020-02-11 DIAGNOSIS — T8459XA Infection and inflammatory reaction due to other internal joint prosthesis, initial encounter: Secondary | ICD-10-CM

## 2020-02-11 DIAGNOSIS — M25552 Pain in left hip: Secondary | ICD-10-CM | POA: Diagnosis present

## 2020-02-11 HISTORY — PX: INCISION AND DRAINAGE HIP: SHX1801

## 2020-02-11 LAB — BASIC METABOLIC PANEL
Anion gap: 8 (ref 5–15)
BUN: 21 mg/dL (ref 8–23)
CO2: 25 mmol/L (ref 22–32)
Calcium: 9.7 mg/dL (ref 8.9–10.3)
Chloride: 104 mmol/L (ref 98–111)
Creatinine, Ser: 1.21 mg/dL (ref 0.61–1.24)
GFR calc Af Amer: >60 mL/min (ref >60)
GFR calc non Af Amer: 58 mL/min — ABNORMAL LOW (ref >60)
Glucose, Bld: 164 mg/dL — ABNORMAL HIGH (ref 70–99)
Potassium: 5.1 mmol/L (ref 3.5–5.1)
Sodium: 137 mmol/L (ref 135–145)

## 2020-02-11 SURGERY — IRRIGATION AND DEBRIDEMENT HIP WITH POLY EXCHANGE
Anesthesia: General | Site: Hip | Laterality: Left

## 2020-02-11 MED ORDER — ROCURONIUM BROMIDE 10 MG/ML (PF) SYRINGE
PREFILLED_SYRINGE | INTRAVENOUS | Status: DC | PRN
  Administered 2020-02-11: 10 mg via INTRAVENOUS
  Administered 2020-02-11: 60 mg via INTRAVENOUS

## 2020-02-11 MED ORDER — OXYCODONE HCL 5 MG PO TABS: 10.0000 mg | ORAL_TABLET | ORAL | Status: DC | PRN

## 2020-02-11 MED ORDER — VANCOMYCIN HCL IN DEXTROSE 1-5 GM/200ML-% IV SOLN
1000.0000 mg | Freq: Once | INTRAVENOUS | Status: AC
  Administered 2020-02-11: 1000 mg via INTRAVENOUS
  Filled 2020-02-11: qty 200

## 2020-02-11 MED ORDER — ONDANSETRON HCL 4 MG/2ML IJ SOLN
INTRAMUSCULAR | Status: DC | PRN
  Administered 2020-02-11: 4 mg via INTRAVENOUS

## 2020-02-11 MED ORDER — FENTANYL CITRATE (PF) 100 MCG/2ML IJ SOLN
INTRAMUSCULAR | Status: AC
  Filled 2020-02-11: qty 2

## 2020-02-11 MED ORDER — ONDANSETRON HCL 4 MG PO TABS: 4.0000 mg | ORAL_TABLET | Freq: Four times a day (QID) | ORAL | Status: DC | PRN

## 2020-02-11 MED ORDER — POVIDONE-IODINE 10 % EX SWAB: 2.0000 "application " | Freq: Once | CUTANEOUS | Status: DC

## 2020-02-11 MED ORDER — ROCURONIUM BROMIDE 10 MG/ML (PF) SYRINGE
PREFILLED_SYRINGE | INTRAVENOUS | Status: AC
  Filled 2020-02-11: qty 10

## 2020-02-11 MED ORDER — ONDANSETRON HCL 4 MG/2ML IJ SOLN
INTRAMUSCULAR | Status: AC
  Filled 2020-02-11: qty 2

## 2020-02-11 MED ORDER — ONDANSETRON HCL 4 MG/2ML IJ SOLN: 4.0000 mg | Freq: Four times a day (QID) | INTRAMUSCULAR | Status: DC | PRN

## 2020-02-11 MED ORDER — PANTOPRAZOLE SODIUM 40 MG PO TBEC
40.0000 mg | DELAYED_RELEASE_TABLET | Freq: Every day | ORAL | Status: DC
  Administered 2020-02-12 – 2020-02-14 (×3): 40 mg via ORAL
  Filled 2020-02-11 (×4): qty 1

## 2020-02-11 MED ORDER — VANCOMYCIN HCL 1000 MG IV SOLR
INTRAVENOUS | Status: AC
  Filled 2020-02-11: qty 1000

## 2020-02-11 MED ORDER — TROSPIUM CHLORIDE ER 60 MG PO CP24: 60.0000 mg | ORAL_CAPSULE | Freq: Every day | ORAL | Status: DC

## 2020-02-11 MED ORDER — DEXAMETHASONE SODIUM PHOSPHATE 10 MG/ML IJ SOLN
INTRAMUSCULAR | Status: AC
  Filled 2020-02-11: qty 1

## 2020-02-11 MED ORDER — VANCOMYCIN HCL 750 MG/150ML IV SOLN
750.0000 mg | Freq: Two times a day (BID) | INTRAVENOUS | Status: DC
  Administered 2020-02-11 – 2020-02-14 (×5): 750 mg via INTRAVENOUS
  Filled 2020-02-11 (×6): qty 150

## 2020-02-11 MED ORDER — METOCLOPRAMIDE HCL 5 MG/ML IJ SOLN: 5.0000 mg | Freq: Three times a day (TID) | INTRAMUSCULAR | Status: DC | PRN

## 2020-02-11 MED ORDER — PROPOFOL 10 MG/ML IV BOLUS
INTRAVENOUS | Status: DC | PRN
  Administered 2020-02-11: 170 mg via INTRAVENOUS

## 2020-02-11 MED ORDER — LIDOCAINE 2% (20 MG/ML) 5 ML SYRINGE
INTRAMUSCULAR | Status: AC
  Filled 2020-02-11: qty 5

## 2020-02-11 MED ORDER — ONDANSETRON HCL 4 MG/2ML IJ SOLN: 4.0000 mg | Freq: Once | INTRAMUSCULAR | Status: DC | PRN

## 2020-02-11 MED ORDER — PHENYLEPHRINE 40 MCG/ML (10ML) SYRINGE FOR IV PUSH (FOR BLOOD PRESSURE SUPPORT)
PREFILLED_SYRINGE | INTRAVENOUS | Status: AC
  Filled 2020-02-11: qty 10

## 2020-02-11 MED ORDER — METOCLOPRAMIDE HCL 5 MG PO TABS: 5.0000 mg | ORAL_TABLET | Freq: Three times a day (TID) | ORAL | Status: DC | PRN

## 2020-02-11 MED ORDER — METHOCARBAMOL 500 MG IVPB - SIMPLE MED
500.0000 mg | Freq: Four times a day (QID) | INTRAVENOUS | Status: DC | PRN
  Filled 2020-02-11: qty 50

## 2020-02-11 MED ORDER — VANCOMYCIN HCL 1500 MG/300ML IV SOLN
1500.0000 mg | Freq: Once | INTRAVENOUS | Status: DC
  Filled 2020-02-11: qty 300

## 2020-02-11 MED ORDER — LIDOCAINE 2% (20 MG/ML) 5 ML SYRINGE
INTRAMUSCULAR | Status: DC | PRN
  Administered 2020-02-11: 40 mg via INTRAVENOUS

## 2020-02-11 MED ORDER — FENTANYL CITRATE (PF) 100 MCG/2ML IJ SOLN
25.0000 ug | INTRAMUSCULAR | Status: DC | PRN
  Administered 2020-02-11 (×2): 50 ug via INTRAVENOUS

## 2020-02-11 MED ORDER — APALUTAMIDE 60 MG PO TABS
360.0000 mg | ORAL_TABLET | Freq: Every day | ORAL | Status: DC
  Administered 2020-02-14: 360 mg via ORAL

## 2020-02-11 MED ORDER — VANCOMYCIN HCL 1000 MG IV SOLR
INTRAVENOUS | Status: DC | PRN
  Administered 2020-02-11: 1000 mg

## 2020-02-11 MED ORDER — ACETAMINOPHEN 325 MG PO TABS: 325.0000 mg | ORAL_TABLET | Freq: Four times a day (QID) | ORAL | Status: DC | PRN

## 2020-02-11 MED ORDER — DOCUSATE SODIUM 100 MG PO CAPS
100.0000 mg | ORAL_CAPSULE | Freq: Two times a day (BID) | ORAL | Status: DC
  Administered 2020-02-11 – 2020-02-15 (×8): 100 mg via ORAL
  Filled 2020-02-11 (×8): qty 1

## 2020-02-11 MED ORDER — METHOCARBAMOL 500 MG PO TABS
500.0000 mg | ORAL_TABLET | Freq: Four times a day (QID) | ORAL | Status: DC | PRN
  Administered 2020-02-11 – 2020-02-13 (×6): 500 mg via ORAL
  Filled 2020-02-11 (×6): qty 1

## 2020-02-11 MED ORDER — SODIUM CHLORIDE 0.9 % IV SOLN: INTRAVENOUS | Status: DC

## 2020-02-11 MED ORDER — HYDROMORPHONE HCL 1 MG/ML IJ SOLN: 0.5000 mg | INTRAMUSCULAR | Status: DC | PRN

## 2020-02-11 MED ORDER — FENTANYL CITRATE (PF) 100 MCG/2ML IJ SOLN
INTRAMUSCULAR | Status: AC
  Filled 2020-02-11: qty 4

## 2020-02-11 MED ORDER — ACETAMINOPHEN 160 MG/5ML PO SOLN: 325.0000 mg | ORAL | Status: DC | PRN

## 2020-02-11 MED ORDER — OXYCODONE HCL 5 MG PO TABS: 5.0000 mg | ORAL_TABLET | Freq: Once | ORAL | Status: DC | PRN

## 2020-02-11 MED ORDER — MEPERIDINE HCL 50 MG/ML IJ SOLN: 6.2500 mg | INTRAMUSCULAR | Status: DC | PRN

## 2020-02-11 MED ORDER — OXYCODONE HCL 5 MG PO TABS
5.0000 mg | ORAL_TABLET | ORAL | Status: DC | PRN
  Administered 2020-02-11: 5 mg via ORAL
  Administered 2020-02-12: 10 mg via ORAL
  Administered 2020-02-12 (×2): 5 mg via ORAL
  Administered 2020-02-12: 10 mg via ORAL
  Administered 2020-02-13 (×2): 5 mg via ORAL
  Filled 2020-02-11 (×4): qty 1
  Filled 2020-02-11 (×2): qty 2
  Filled 2020-02-11: qty 1
  Filled 2020-02-11: qty 2

## 2020-02-11 MED ORDER — SODIUM CHLORIDE 0.9 % IR SOLN
Status: DC | PRN
  Administered 2020-02-11 (×2): 3000 mL

## 2020-02-11 MED ORDER — PHENYLEPHRINE 40 MCG/ML (10ML) SYRINGE FOR IV PUSH (FOR BLOOD PRESSURE SUPPORT)
PREFILLED_SYRINGE | INTRAVENOUS | Status: DC | PRN
  Administered 2020-02-11: 60 ug via INTRAVENOUS

## 2020-02-11 MED ORDER — DEXAMETHASONE SODIUM PHOSPHATE 10 MG/ML IJ SOLN
INTRAMUSCULAR | Status: DC | PRN
  Administered 2020-02-11: 4 mg via INTRAVENOUS

## 2020-02-11 MED ORDER — TRANEXAMIC ACID-NACL 1000-0.7 MG/100ML-% IV SOLN
1000.0000 mg | INTRAVENOUS | Status: AC
  Administered 2020-02-11: 1000 mg via INTRAVENOUS
  Filled 2020-02-11: qty 100

## 2020-02-11 MED ORDER — FENTANYL CITRATE (PF) 250 MCG/5ML IJ SOLN
INTRAMUSCULAR | Status: DC | PRN
  Administered 2020-02-11 (×2): 25 ug via INTRAVENOUS
  Administered 2020-02-11: 100 ug via INTRAVENOUS
  Administered 2020-02-11: 50 ug via INTRAVENOUS

## 2020-02-11 MED ORDER — ACETAMINOPHEN 325 MG PO TABS: 325.0000 mg | ORAL_TABLET | ORAL | Status: DC | PRN

## 2020-02-11 MED ORDER — SUGAMMADEX SODIUM 200 MG/2ML IV SOLN
INTRAVENOUS | Status: DC | PRN
  Administered 2020-02-11: 250 mg via INTRAVENOUS

## 2020-02-11 MED ORDER — SUGAMMADEX SODIUM 500 MG/5ML IV SOLN
INTRAVENOUS | Status: AC
  Filled 2020-02-11: qty 5

## 2020-02-11 MED ORDER — LACTATED RINGERS IV SOLN: INTRAVENOUS | Status: DC

## 2020-02-11 MED ORDER — 0.9 % SODIUM CHLORIDE (POUR BTL) OPTIME
TOPICAL | Status: DC | PRN
  Administered 2020-02-11: 09:00:00 1000 mL

## 2020-02-11 MED ORDER — OXYCODONE HCL 5 MG/5ML PO SOLN: 5.0000 mg | Freq: Once | ORAL | Status: DC | PRN

## 2020-02-11 MED ORDER — VANCOMYCIN HCL 1000 MG IV SOLR
INTRAVENOUS | Status: DC | PRN
  Administered 2020-02-11: 1500 mg via INTRAVENOUS

## 2020-02-11 MED ORDER — DARIFENACIN HYDROBROMIDE ER 15 MG PO TB24
15.0000 mg | ORAL_TABLET | Freq: Every day | ORAL | Status: DC
  Administered 2020-02-12 – 2020-02-15 (×4): 15 mg via ORAL
  Filled 2020-02-11 (×4): qty 1

## 2020-02-11 MED ORDER — DIPHENHYDRAMINE HCL 12.5 MG/5ML PO ELIX: 12.5000 mg | ORAL_SOLUTION | ORAL | Status: DC | PRN

## 2020-02-11 MED ORDER — PROPOFOL 10 MG/ML IV BOLUS
INTRAVENOUS | Status: AC
  Filled 2020-02-11: qty 20

## 2020-02-11 MED ORDER — PHENYLEPHRINE HCL (PRESSORS) 10 MG/ML IV SOLN
INTRAVENOUS | Status: AC
  Filled 2020-02-11: qty 1

## 2020-02-11 MED ORDER — ASPIRIN 81 MG PO CHEW
81.0000 mg | CHEWABLE_TABLET | Freq: Two times a day (BID) | ORAL | Status: DC
  Administered 2020-02-11 – 2020-02-15 (×8): 81 mg via ORAL
  Filled 2020-02-11 (×8): qty 1

## 2020-02-11 SURGICAL SUPPLY — 42 items
APL SKNCLS STERI-STRIP NONHPOA (GAUZE/BANDAGES/DRESSINGS)
BAG SPEC THK2 15X12 ZIP CLS (MISCELLANEOUS)
BAG ZIPLOCK 12X15 (MISCELLANEOUS) IMPLANT
BENZOIN TINCTURE PRP APPL 2/3 (GAUZE/BANDAGES/DRESSINGS) IMPLANT
BLADE SAW SGTL 18X1.27X75 (BLADE) ×1 IMPLANT
BLADE SAW SGTL 18X1.27X75MM (BLADE)
CLOSURE WOUND 1/2 X4 (GAUZE/BANDAGES/DRESSINGS)
COVER PERINEAL POST (MISCELLANEOUS) ×3 IMPLANT
COVER SURGICAL LIGHT HANDLE (MISCELLANEOUS) ×3 IMPLANT
COVER WAND RF STERILE (DRAPES) ×3 IMPLANT
DRAPE STERI IOBAN 125X83 (DRAPES) ×3 IMPLANT
DRAPE U-SHAPE 47X51 STRL (DRAPES) ×6 IMPLANT
DRESSING AQUACEL AG SP 3.5X10 (GAUZE/BANDAGES/DRESSINGS) IMPLANT
DRSG AQUACEL AG ADV 3.5X10 (GAUZE/BANDAGES/DRESSINGS) ×3 IMPLANT
DRSG AQUACEL AG SP 3.5X10 (GAUZE/BANDAGES/DRESSINGS) ×3
DURAPREP 26ML APPLICATOR (WOUND CARE) ×3 IMPLANT
ELECT REM PT RETURN 15FT ADLT (MISCELLANEOUS) ×3 IMPLANT
GAUZE XEROFORM 1X8 LF (GAUZE/BANDAGES/DRESSINGS) ×3 IMPLANT
GLOVE BIO SURGEON STRL SZ7.5 (GLOVE) ×3 IMPLANT
GLOVE BIOGEL PI IND STRL 8 (GLOVE) ×2 IMPLANT
GLOVE BIOGEL PI INDICATOR 8 (GLOVE) ×4
GLOVE ECLIPSE 8.0 STRL XLNG CF (GLOVE) ×3 IMPLANT
GOWN STRL REUS W/TWL XL LVL3 (GOWN DISPOSABLE) ×6 IMPLANT
HANDPIECE INTERPULSE COAX TIP (DISPOSABLE) ×3
HOLDER FOLEY CATH W/STRAP (MISCELLANEOUS) ×3 IMPLANT
KIT STIMULAN RAPID CURE  10CC (Orthopedic Implant) ×3 IMPLANT
KIT STIMULAN RAPID CURE 10CC (Orthopedic Implant) IMPLANT
KIT TURNOVER KIT A (KITS) IMPLANT
PACK ANTERIOR HIP CUSTOM (KITS) ×3 IMPLANT
PENCIL SMOKE EVACUATOR (MISCELLANEOUS) IMPLANT
SET HNDPC FAN SPRY TIP SCT (DISPOSABLE) ×1 IMPLANT
STAPLER VISISTAT 35W (STAPLE) IMPLANT
STRIP CLOSURE SKIN 1/2X4 (GAUZE/BANDAGES/DRESSINGS) IMPLANT
SUT ETHIBOND NAB CT1 #1 30IN (SUTURE) ×3 IMPLANT
SUT ETHILON 2 0 PS N (SUTURE) IMPLANT
SUT MNCRL AB 4-0 PS2 18 (SUTURE) IMPLANT
SUT VIC AB 0 CT1 36 (SUTURE) ×3 IMPLANT
SUT VIC AB 1 CT1 36 (SUTURE) ×5 IMPLANT
SUT VIC AB 2-0 CT1 27 (SUTURE) ×6
SUT VIC AB 2-0 CT1 TAPERPNT 27 (SUTURE) ×2 IMPLANT
TRAY FOLEY MTR SLVR 16FR STAT (SET/KITS/TRAYS/PACK) IMPLANT
YANKAUER SUCT BULB TIP 10FT TU (MISCELLANEOUS) ×3 IMPLANT

## 2020-02-11 NOTE — Brief Op Note (Signed)
02/11/2020  10:03 AM  PATIENT:  [name]  74 y.o. 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   Delay start of Pharmacological VTE agent (>24hrs) due to surgical blood loss or risk of bleeding: no

## 2020-02-11 NOTE — Progress Notes (Signed)
Pharmacy Antibiotic Note  [name] is a 74 y.o. male admitted on 02/11/2020 with questionable infection of left hip, s/p I&D on 5/7.  Pharmacy has been consulted for vancomycin dosing. CRP 34.8, Sed rate 38 SCr 1.2, WBC wnl, Afebrile  Plan:  Vancomycin 1g IV given preop (increased to Vancomycin 1.5g per pharmacy protocol for weight > 100kg, but updated order was not given).  Vancomycin 1g IV x1 dose now (give in addition to preop 1g dose for total loading dose 2g) then maintenance dose 750 mg IV q 12 h.  Dosing per nomogram.  Only check vancomycin trough if needed.    Follow up renal function, culture results, and clinical course.   Height: 6\' 3"  (190.5 cm) Weight: 115.2 kg (254 lb) IBW/kg (Calculated) : 84.5  Temp (24hrs), Avg:98 F (36.7 C), Min:97.7 F (36.5 C), Max:98.6 F (37 C)  Recent Labs  Lab 02/09/20 1434  WBC 6.3    CrCl cannot be calculated (Patient's most recent lab result is older than the maximum 21 days allowed.).    Allergies  Allergen Reactions  . Adhesive [Tape] Rash    Paper tape is ok    Antimicrobials this admission: 5/7 Vancomycin >>  Dose adjustments this admission:  Microbiology results: 5/7 Hip:   Thank you for allowing pharmacy to be a part of this patient's care.  Gretta Arab PharmD, BCPS Clinical Pharmacist [facility] main pharmacy [phone] 02/11/2020 12:25 PM

## 2020-02-11 NOTE — Op Note (Signed)
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  PROCEDURE: 1.  Irrigation and debridement of left hip joint. 2.  Placement of deep antibiotic Stimulan beads with vancomycin, left hip joint.  FINDINGS:  Fluid collection around the left hip prosthesis with stat Gram stain and cultures, intraoperative sent.  SURGEON:  Vanita Panda. Magnus Ivan, MD  ASSISTANT:  Richardean Canal, PA-C  ANESTHESIA:  General.  ANTIBIOTICS:  One gram IV vancomycin after cultures obtained.  ESTIMATED BLOOD LOSS:  200 mL.  COMPLICATIONS:  None.  INDICATIONS:  The patient is a 74 year old gentleman well known to me.  He has a complicated medical history and surgical history as it relates to his left hip.  He had an original left total hip arthroplasty done many years ago by one of my colleagues.   This was done through a posterior approach.  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  This femoral  component with a S-ROM DePuy femoral component, which was well seated.  He was taken to the operating room several years ago for revision of the acetabular component only.  We were able to dislocate the hip and revise the acetabular component.  There was  question at some point of him developing a staph infection that was sensitive.  He was on antibiotics for quite some time.  The acetabular component that I placed then failed and he was taken to the operating room several years ago for a complete  revision of the acetabular  component.  At that point, there was no gross evidence of infection, but we did obtain cultures.  We were able to completely revise the acetabular component with a liner and hip ball as well.  He had then done well for many  years.  At some point had gotten off of suppressive antibiotics.  He has been off antibiotics for a long period of time.  He then developed hip pain earlier this year.  He was seen in our office.  X-rays show well-seated implants.  We did send off labs  including a white blood cell count, sed rate and CRP.  The white blood cell count peripherally was normal but the CRP and sed rate were elevated, worrisome for acute phase reaction with the possibility of infection.  I then had an aspiration performed in  the office under direct fluoroscopy into the hip joint itself.  They did put saline around the hip joint itself and did not get any fluid and so nothing was sent off.  I did obtain an MRI of his left hip and it was worrisome for a fluid collection all  around the hip itself.  The patient understands this is worrisome for chronic infection.  I had a long and thorough discussion with him and I do feel that the most appropriate treatment from a chronic infection like this is complete excision of all  components and placement of antibiotic spacer.  He is 74 years old and does have significant medical issues.  He wants an attempt at suppression.  He understands that this may lead to further surgeries of the hip.  He does say that his hip does  not hurt  at this point.  He still has normal white blood cell count.  He understands why we are recommending surgery though, based on the fluid collection we are seeing on MRI and the likelihood of him having still a chronic infection.  He also understands that  chronic infections cannot be eradicated completed without removal of all hardware.  DESCRIPTION OF PROCEDURE:  After informed consent was obtained and appropriate left hip was marked.  He was  brought to the operating room and placed supine on the operating table where general anesthesia was then obtained.  I placed him on the Hana table  because I felt that the irrigation and debridement through the anterior approach was still more appropriate.  His left operative hip was prepped and draped with DuraPrep and sterile drapes.  A time-out was called.  He was identified, correct patient,  correct left hip.  We then dissected down through the previous incision to the tensor fascia.  I then divided the tensor fascia and got down to the hip joint.  We did find a fluid collection and we sent off 2 sets of the immediate cultures of the  surrounding fluid collection.  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  The soft tissue around this did not appear to be significantly inflamed.  I was able to get down to the hip joint itself.  I was  able to assess the hip ball and acetabular component and I did not see any evidence of metalosis or wear.  I then used a #10 blade and performed an extensive excisional debridement of capsular tissue all around the hip joint to remove as much tissue as I  could for a wide sharp excisional debridement.  We then irrigated 6 liters of normal saline solution through the hip joint itself removing all the visible tissue that I could see that had any inflammatory component or worrisome tissue.  We did give him  IV antibiotics with vancomycin.  We mixed our Stimulan beads with vancomycin and once they had hardened, we placed these all around the arthrotomy and the components.  We then closed the deep tissue with #1 Vicryl followed by the tensor fascia with #1  Vicryl.  We closed the next layer with 0 Vicryl followed by 2-0 Vicryl in subcutaneous tissue and interrupted 2-0 nylon in the skin.  An Aquacel dressing was applied and he was taken off the Hana table, awakened, extubated, and taken to recovery room in  stable condition.   All final counts were correct.  No complications noted.  Postoperatively, he will be admitted as inpatient and we will see what the cultures grow far then likely infectious disease consult and a PICC line placement for long-term  antibiotics.  Of note, Rexene Edison, PA-C, assisted during the entire case and his assistance was definitely crucial for facilitating all aspects of this case.  CN/NUANCE  D:02/11/2020 T:02/11/2020 JOB:[id number]

## 2020-02-11 NOTE — Evaluation (Signed)
Physical Therapy Evaluation Patient Details Name: [name] MRN: [id number] DOB: Feb 11, 1946 Today's Date: 02/11/2020   History of Present Illness  Pt s/p I&D of L THR by anterior direct approach.  Pt with hx of Prostate CA, DDD, back surgery with spinal stimulator implant, and L THR with multiple revisions  Clinical Impression  Pt admitted as above and presenting with functional mobility limitations 2* decreased L LE strength/ROM and post op pain.  Pt should progress well to dc home with assist of family.    Follow Up Recommendations No PT follow up    Equipment Recommendations  None recommended by PT    Recommendations for Other Services       Precautions / Restrictions Precautions Precautions: Fall Restrictions Weight Bearing Restrictions: No Other Position/Activity Restrictions: WBAT      Mobility  Bed Mobility Overal bed mobility: Needs Assistance Bed Mobility: Supine to Sit     Supine to sit: Min guard     General bed mobility comments: min guard for safety  Transfers Overall transfer level: Needs assistance Equipment used: Rolling walker (2 wheeled) Transfers: Sit to/from Stand Sit to Stand: Min guard         General transfer comment: cues for LE management and use of UEs to self assist  Ambulation/Gait Ambulation/Gait assistance: Min assist;Min guard Gait Distance (Feet): 150 Feet Assistive device: Rolling walker (2 wheeled) Gait Pattern/deviations: Step-to pattern;Step-through pattern;Shuffle;Trunk flexed     General Gait Details: cues for posture, position from RW and initial sequence  Stairs            Wheelchair Mobility    Modified Rankin (Stroke Patients Only)       Balance Overall balance assessment: Mild deficits observed, not formally tested                                           Pertinent Vitals/Pain Pain Assessment: 0-10 Pain Score: 5  Pain Location: L hip    Home Living Family/patient  expects to be discharged to:: Private residence Living Arrangements: Spouse/significant other Available Help at Discharge: Family Type of Home: House Home Access: Ramped entrance     Home Layout: Multi-level Home Equipment: Environmental consultant - 2 wheels      Prior Function Level of Independence: Independent               Hand Dominance        Extremity/Trunk Assessment   Upper Extremity Assessment Upper Extremity Assessment: Overall WFL for tasks assessed    Lower Extremity Assessment Lower Extremity Assessment: LLE deficits/detail    Cervical / Trunk Assessment Cervical / Trunk Assessment: Normal  Communication   Communication: No difficulties;HOH  Cognition Arousal/Alertness: Awake/alert Behavior During Therapy: WFL for tasks assessed/performed Overall Cognitive Status: Within Functional Limits for tasks assessed                                        General Comments      Exercises Total Joint Exercises Ankle Circles/Pumps: AROM;Both;15 reps;Supine   Assessment/Plan    PT Assessment    PT Problem List         PT Treatment Interventions      PT Goals (Current goals can be found in the Care Plan section)  Acute Rehab PT Goals Patient Stated  Goal: Regain IND PT Goal Formulation: With patient Time For Goal Achievement: 02/18/20 Potential to Achieve Goals: Good    Frequency 7X/week   Barriers to discharge        Co-evaluation               AM-PAC PT "6 Clicks" Mobility  Outcome Measure Help needed turning from your back to your side while in a flat bed without using bedrails?: A Little Help needed moving from lying on your back to sitting on the side of a flat bed without using bedrails?: A Little Help needed moving to and from a bed to a chair (including a wheelchair)?: A Little Help needed standing up from a chair using your arms (e.g., wheelchair or bedside chair)?: A Little Help needed to walk in hospital room?: A  Little Help needed climbing 3-5 steps with a railing? : A Little 6 Click Score: 18    End of Session Equipment Utilized During Treatment: Gait belt Activity Tolerance: Patient tolerated treatment well Patient left: in chair;with call bell/phone within reach;with family/visitor present;with chair alarm set Nurse Communication: Mobility status PT Visit Diagnosis: Difficulty in walking, not elsewhere classified (R26.2)    Time: 1610-9604 PT Time Calculation (min) (ACUTE ONLY): 23 min   Charges:   PT Evaluation $PT Eval Low Complexity: 1 Low          Mauro Kaufmann PT Acute Rehabilitation Services Pager [phone] Office [phone]   Jordyn Hofacker 02/11/2020, 5:37 PM

## 2020-02-11 NOTE — Anesthesia Procedure Notes (Signed)
Procedure Name: Intubation Date/Time: 02/11/2020 8:42 AM Performed by: Eben Burow, CRNA Pre-anesthesia Checklist: Patient identified, Emergency Drugs available, Suction available, Patient being monitored and Timeout performed Patient Re-evaluated:Patient Re-evaluated prior to induction Oxygen Delivery Method: Circle system utilized Preoxygenation: Pre-oxygenation with 100% oxygen Induction Type: IV induction Ventilation: Mask ventilation without difficulty Laryngoscope Size: Mac and 4 Grade View: Grade I Tube type: Oral Tube size: 7.5 mm Number of attempts: 1 Airway Equipment and Method: Stylet Placement Confirmation: ETT inserted through vocal cords under direct vision,  positive ETCO2 and breath sounds checked- equal and bilateral Secured at: 23 cm Tube secured with: Tape Dental Injury: Teeth and Oropharynx as per pre-operative assessment

## 2020-02-11 NOTE — Interval H&P Note (Signed)
History and Physical Interval Note:  The patient understands that is here today for an irrigation debridement of his left hip due to likely chronic infection.  He has been off antibiotics.  Our goal is to washout the hip today and place deep antibiotic beads if warranted.  We will obtain cultures.  He will then be admitted for further treatment as indicated depending on her interoperative findings.  He understands fully why we are proceeding with this surgery today.  There is been no recent change in his medical status.  See recent H&P.  The left hip has been marked.  The risk and benefits of surgery have been explained in detail and informed consent is obtained.  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  The various methods of treatment have been discussed with the patient and family. After consideration of risks, benefits and other options for treatment, the patient has consented to  Procedure(s): IRRIGATION AND DEBRIDEMENT LEFT HIP WITH POSSIBLE POLYETHYLENE LINER EXCHANGE (Left) as a surgical intervention.  The patient's history has been reviewed, patient examined, no change in status, stable for surgery.  I have reviewed the patient's chart and labs.  Questions were answered to the patient's satisfaction.     Mcarthur Rossetti

## 2020-02-11 NOTE — Transfer of Care (Signed)
Immediate Anesthesia Transfer of Care Note  Patient: [name]  Procedure(s) Performed: IRRIGATION AND DEBRIDEMENT LEFT HIP WITH ANTIBIOTIC SPACER PLACEMENT (Left Hip)  Patient Location: PACU  Anesthesia Type:General  Level of Consciousness: awake, alert  and oriented  Airway & Oxygen Therapy: Patient Spontanous Breathing and Patient connected to face mask oxygen  Post-op Assessment: Report given to RN and Post -op Vital signs reviewed and stable  Post vital signs: Reviewed and stable  Last Vitals:  Vitals Value Taken Time  BP 139/78 02/11/20 1028  Temp    Pulse 59 02/11/20 1031  Resp 16 02/11/20 1031  SpO2 99 % 02/11/20 1031  Vitals shown include unvalidated device data.  Last Pain:  Vitals:   02/11/20 0655  TempSrc: Oral      Patients Stated Pain Goal: 3 ([id number])  Complications: No apparent anesthesia complications

## 2020-02-11 NOTE — Anesthesia Postprocedure Evaluation (Signed)
Anesthesia Post Note  Patient: [name]  Procedure(s) Performed: IRRIGATION AND DEBRIDEMENT LEFT HIP WITH ANTIBIOTIC SPACER PLACEMENT (Left Hip)     Patient location during evaluation: PACU Anesthesia Type: General Level of consciousness: awake and alert Pain management: pain level controlled Vital Signs Assessment: post-procedure vital signs reviewed and stable Respiratory status: spontaneous breathing, nonlabored ventilation, respiratory function stable and patient connected to nasal cannula oxygen Cardiovascular status: blood pressure returned to baseline and stable Postop Assessment: no apparent nausea or vomiting Anesthetic complications: no    Last Vitals:  Vitals:   02/11/20 1357 02/11/20 1527  BP: 128/67 129/75  Pulse: (!) 53 (!) 58  Resp: 17 17  Temp: 36.6 C 36.6 C  SpO2: 100% 98%    Last Pain:  Vitals:   02/11/20 1527  TempSrc: Oral  PainSc:                  Cayce Paschal

## 2020-02-12 NOTE — Plan of Care (Signed)
  Problem: Clinical Measurements: Goal: Respiratory complications will improve 02/12/2020 1008 by Elza Rafter, RN Outcome: Progressing 02/12/2020 1008 by Elza Rafter, RN Outcome: Progressing   Problem: Clinical Measurements: Goal: Diagnostic test results will improve 02/12/2020 1008 by Elza Rafter, RN Outcome: Progressing 02/12/2020 1008 by Elza Rafter, RN Outcome: Progressing   Problem: Clinical Measurements: Goal: Will remain free from infection 02/12/2020 1008 by Elza Rafter, RN Outcome: Progressing 02/12/2020 1008 by Elza Rafter, RN Outcome: Progressing   Problem: Clinical Measurements: Goal: Cardiovascular complication will be avoided 02/12/2020 1008 by Elza Rafter, RN Outcome: Progressing 02/12/2020 1008 by Elza Rafter, RN Outcome: Progressing   Problem: Nutrition: Goal: Adequate nutrition will be maintained 02/12/2020 1008 by Elza Rafter, RN Outcome: Progressing 02/12/2020 1008 by Elza Rafter, RN Outcome: Progressing   Problem: Pain Managment: Goal: General experience of comfort will improve 02/12/2020 1008 by Elza Rafter, RN Outcome: Progressing 02/12/2020 1008 by Elza Rafter, RN Outcome: Progressing

## 2020-02-12 NOTE — Progress Notes (Signed)
Patient ID: [name], male   DOB: Nov 30, 1945, 74 y.o.   MRN: [id number] [name] is comfortable this morning.  He is status post irrigation and debridement of his left hip.  We did find fluid around the hip joint itself.  The stat Gram stain and culture showed moderate white blood cells and no organisms.  Certainly this could be metallosis but given his past history of infection it could still be infection.  The cultures are being reincubated.  He understands that I would not discharge him until we know the final results of those cultures.  If it does come back positive for infection, he would need a PICC line and at least 6 weeks of IV antibiotics followed by suppressive oral antibiotics.  His dressing is clean and dry today and he has been mobilizing.  We will continue to follow him closely.  I do not anticipate discharging him until at least Monday.

## 2020-02-12 NOTE — Progress Notes (Signed)
Physical Therapy Treatment Patient Details Name: [name] MRN: [id number] DOB: 1946/04/06 Today's Date: 02/12/2020    History of Present Illness Pt s/p I&D of L THR by anterior direct approach.  Pt with hx of Prostate CA, DDD, back surgery with spinal stimulator implant, and L THR with multiple revisions    PT Comments    Pt very motivated and progressed to ambulation with SPC as well as negotiating stairs and up to bathroom.  Pt eager for dc home.   Follow Up Recommendations  Follow surgeon's recommendation for DC plan and follow-up therapies     Equipment Recommendations  None recommended by PT    Recommendations for Other Services       Precautions / Restrictions Precautions Precautions: Fall Restrictions Weight Bearing Restrictions: No Other Position/Activity Restrictions: WBAT    Mobility  Bed Mobility Overal bed mobility: Needs Assistance Bed Mobility: Sit to Supine       Sit to supine: Min guard   General bed mobility comments: min guard for safety  Transfers Overall transfer level: Needs assistance Equipment used: Rolling walker (2 wheeled) Transfers: Sit to/from Stand Sit to Stand: Min guard;Supervision         General transfer comment: cues for LE management and use of UEs to self assist  Ambulation/Gait Ambulation/Gait assistance: Min guard Gait Distance (Feet): 380 Feet Assistive device: Rolling walker (2 wheeled);Straight cane Gait Pattern/deviations: Step-to pattern;Step-through pattern;Shuffle;Trunk flexed     General Gait Details: cues for posture and position from RW; pt ambulated 190' with RW and additional  190" with cane   Stairs Stairs: Yes Stairs assistance: Min guard Stair Management: One rail Left;Step to pattern;Forwards;With cane Number of Stairs: 7 General stair comments: min cues for sequence   Wheelchair Mobility    Modified Rankin (Stroke Patients Only)       Balance Overall balance assessment: Mild  deficits observed, not formally tested                                          Cognition Arousal/Alertness: Awake/alert Behavior During Therapy: WFL for tasks assessed/performed Overall Cognitive Status: Within Functional Limits for tasks assessed                                        Exercises      General Comments        Pertinent Vitals/Pain Pain Assessment: 0-10 Pain Score: 4  Pain Location: L hip Pain Descriptors / Indicators: Aching;Sore Pain Intervention(s): Limited activity within patient's tolerance;Monitored during session;Premedicated before session    Home Living                      Prior Function            PT Goals (current goals can now be found in the care plan section) Acute Rehab PT Goals Patient Stated Goal: Regain IND PT Goal Formulation: With patient Time For Goal Achievement: 02/18/20 Potential to Achieve Goals: Good Progress towards PT goals: Progressing toward goals    Frequency    7X/week      PT Plan Current plan remains appropriate    Co-evaluation              AM-PAC PT "6 Clicks" Mobility   Outcome Measure  Help  needed turning from your back to your side while in a flat bed without using bedrails?: A Little Help needed moving from lying on your back to sitting on the side of a flat bed without using bedrails?: A Little Help needed moving to and from a bed to a chair (including a wheelchair)?: A Little Help needed standing up from a chair using your arms (e.g., wheelchair or bedside chair)?: A Little Help needed to walk in hospital room?: A Little Help needed climbing 3-5 steps with a railing? : A Little 6 Click Score: 18    End of Session Equipment Utilized During Treatment: Gait belt Activity Tolerance: Patient tolerated treatment well Patient left: in bed;with call bell/phone within reach;with family/visitor present Nurse Communication: Mobility status PT Visit  Diagnosis: Difficulty in walking, not elsewhere classified (R26.2)     Time: 6045-4098 PT Time Calculation (min) (ACUTE ONLY): 27 min  Charges:  $Gait Training: 8-22 mins $Therapeutic Activity: 8-22 mins                     Mauro Kaufmann PT Acute Rehabilitation Services Pager [phone] Office [phone]    Gery Sabedra 02/12/2020, 5:14 PM

## 2020-02-12 NOTE — Plan of Care (Signed)
  Problem: Health Behavior/Discharge Planning: Goal: Ability to manage health-related needs will improve Outcome: Progressing   Problem: Clinical Measurements: Goal: Ability to maintain clinical measurements within normal limits will improve Outcome: Progressing Goal: Will remain free from infection Outcome: Progressing Goal: Diagnostic test results will improve Outcome: Progressing Goal: Respiratory complications will improve Outcome: Progressing Goal: Cardiovascular complication will be avoided Outcome: Progressing   Problem: Activity: Goal: Risk for activity intolerance will decrease Outcome: Progressing   Problem: Nutrition: Goal: Adequate nutrition will be maintained Outcome: Progressing   Problem: Elimination: Goal: Will not experience complications related to bowel motility Outcome: Progressing Goal: Will not experience complications related to urinary retention Outcome: Progressing   Problem: Pain Managment: Goal: General experience of comfort will improve Outcome: Progressing   Problem: Safety: Goal: Ability to remain free from injury will improve Outcome: Progressing   Problem: Skin Integrity: Goal: Risk for impaired skin integrity will decrease Outcome: Progressing   Problem: Education: Goal: Knowledge of the prescribed therapeutic regimen will improve Outcome: Progressing Goal: Understanding of discharge needs will improve Outcome: Progressing   Problem: Activity: Goal: Ability to avoid complications of mobility impairment will improve Outcome: Progressing Goal: Ability to tolerate increased activity will improve Outcome: Progressing   Problem: Clinical Measurements: Goal: Postoperative complications will be avoided or minimized Outcome: Progressing   Problem: Pain Management: Goal: Pain level will decrease with appropriate interventions Outcome: Progressing   Problem: Skin Integrity: Goal: Will show signs of wound healing Outcome:  Progressing

## 2020-02-12 NOTE — Progress Notes (Signed)
Physical Therapy Treatment Patient Details Name: [name] MRN: [id number] DOB: 1946/07/12 Today's Date: 02/12/2020    History of Present Illness Pt s/p I&D of L THR by anterior direct approach.  Pt with hx of Prostate CA, DDD, back surgery with spinal stimulator implant, and L THR with multiple revisions    PT Comments    Pt progressing well with mobility - pain well controlled and pt in good spirits.   Follow Up Recommendations  Follow surgeon's recommendation for DC plan and follow-up therapies     Equipment Recommendations  None recommended by PT    Recommendations for Other Services       Precautions / Restrictions Precautions Precautions: Fall Restrictions Weight Bearing Restrictions: No Other Position/Activity Restrictions: WBAT    Mobility  Bed Mobility                  Transfers Overall transfer level: Needs assistance Equipment used: Rolling walker (2 wheeled) Transfers: Sit to/from Stand Sit to Stand: Min guard         General transfer comment: cues for LE management and use of UEs to self assist  Ambulation/Gait Ambulation/Gait assistance: Min guard Gait Distance (Feet): 450 Feet   Gait Pattern/deviations: Step-to pattern;Step-through pattern;Shuffle;Trunk flexed     General Gait Details: cues for posture, position from RW and initial sequence   Stairs             Wheelchair Mobility    Modified Rankin (Stroke Patients Only)       Balance Overall balance assessment: Mild deficits observed, not formally tested                                          Cognition Arousal/Alertness: Awake/alert Behavior During Therapy: WFL for tasks assessed/performed Overall Cognitive Status: Within Functional Limits for tasks assessed                                        Exercises      General Comments        Pertinent Vitals/Pain Pain Assessment: 0-10 Pain Score: 4  Pain Location: L  hip Pain Descriptors / Indicators: Aching;Sore Pain Intervention(s): Limited activity within patient's tolerance;Monitored during session;Premedicated before session;Ice applied    Home Living                      Prior Function            PT Goals (current goals can now be found in the care plan section) Acute Rehab PT Goals Patient Stated Goal: Regain IND PT Goal Formulation: With patient Time For Goal Achievement: 02/18/20 Potential to Achieve Goals: Good Progress towards PT goals: Progressing toward goals    Frequency    7X/week      PT Plan Current plan remains appropriate    Co-evaluation              AM-PAC PT "6 Clicks" Mobility   Outcome Measure  Help needed turning from your back to your side while in a flat bed without using bedrails?: A Little Help needed moving from lying on your back to sitting on the side of a flat bed without using bedrails?: A Little Help needed moving to and from a bed to a chair (including a  wheelchair)?: A Little Help needed standing up from a chair using your arms (e.g., wheelchair or bedside chair)?: A Little Help needed to walk in hospital room?: A Little Help needed climbing 3-5 steps with a railing? : A Little 6 Click Score: 18    End of Session Equipment Utilized During Treatment: Gait belt Activity Tolerance: Patient tolerated treatment well Patient left: in chair;with call bell/phone within reach;with family/visitor present;with chair alarm set Nurse Communication: Mobility status PT Visit Diagnosis: Difficulty in walking, not elsewhere classified (R26.2)     Time: 1040-1102 PT Time Calculation (min) (ACUTE ONLY): 22 min  Charges:  $Gait Training: 8-22 mins                     [name] PT Acute Rehabilitation Services Pager [phone] Office [phone]    [name] 02/12/2020, 5:09 PM

## 2020-02-13 NOTE — Progress Notes (Signed)
     Subjective: 2 Days Post-Op Procedure(s) (LRB): IRRIGATION AND DEBRIDEMENT LEFT HIP WITH ANTIBIOTIC SPACER PLACEMENT (Left) Awake, alert and oriented x 4. Patient is sitting upright in bed no complaints. Results of  Preliminary culture discussed with patient. He has a planned trip to [place] in June. Wife is knowledgeable about PICCU line and antibiotics and hopefully by June may be able to go. Continue with PT. Await susceptibility testing of the staph a.   Patient reports pain as moderate.    Objective:   VITALS:  Temp:  [98.1 F (36.7 C)-98.8 F (37.1 C)] 98.8 F (37.1 C) (05/09 0619) Pulse Rate:  [55-62] 57 (05/09 0619) Resp:  [15-18] 18 (05/09 0619) BP: (114-125)/(52-67) 125/67 (05/09 0619) SpO2:  [96 %-99 %] 96 % (05/09 0619)  Neurologically intact ABD soft Neurovascular intact Sensation intact distally Intact pulses distally Incision: no drainage No cellulitis present Compartment soft   LABS No results for input(s): HGB, WBC, PLT in the last 72 hours. Recent Labs    02/11/20 1319  NA 137  K 5.1  CL 104  CO2 25  BUN 21  CREATININE 1.21  GLUCOSE 164*   No results for input(s): LABPT, INR in the last 72 hours.  Specimen Information: Joint, Hip     Component 2 d ago  Specimen Description HIP LEFT 2 FLUID  Performed at [facility], [place] [address] [place], [place] [address]   Special Requests NONE  Performed at [facility], [place] [address] [place], [place] [address]   Gram Stain ABUNDANT WBC PRESENT, PREDOMINANTLY PMN  NO ORGANISMS SEEN  Gram Stain Report Called to,Read Back By and Verified With: [id number]. RN @1018  ON 5.7.2021 BY [facility]  Performed at [facility], [place] [address] [place], [place] [address]   Culture FEW STAPHYLOCOCCUS AUREUS  SUSCEPTIBILITIES TO FOLLOW  CRITICAL RESULT CALLED TO, READ BACK BY AND VERIFIED WITH: J. MILLER RN, AT [phone] 02/13/20 BY D. Victoriano Lain REGARDING CULTURE  GROWTH  Performed at [facility], [place] [address], [place], [place] [address]   Report Status PENDING          Specimen Collected: 02/11/20 09:15 Last Resulted: 02/13/20 09:18      Lab Flowsheet    Order Details    View Encounter    Lab and Collection Details    Routing    Result History         MyChart Results Release  MyChart Status: Active Results Release    Assessment/Plan: 2 Days Post-Op Procedure(s) (LRB): IRRIGATION AND DEBRIDEMENT LEFT HIP WITH ANTIBIOTIC SPACER PLACEMENT (Left)  Advance diet Up with therapy Continue ABX therapy due to Post-op infection  Awaiting culture susceptibility testing for MRSA vs MSSA. Continue with PT/OT and aspirin for antiDVT prophylaxis. Apparently his Twin Brother passed recently due to PE and thromboembolic disease.  Basil Dess 02/13/2020, 9:28 AMPatient ID: [name], male   DOB: August 29, 1946, 74 y.o.   MRN: [id number]

## 2020-02-13 NOTE — Progress Notes (Signed)
Physical Therapy Treatment Patient Details Name: [name] MRN: [id number] DOB: June 02, 1946 Today's Date: 02/13/2020    History of Present Illness Pt s/p I&D of L THR by anterior direct approach.  Pt with hx of Prostate CA, DDD, back surgery with spinal stimulator implant, and L THR with multiple revisions    PT Comments    Pt continues steady progress with mobility and currently at Sup level and eager for dc home.   Follow Up Recommendations  Follow surgeon's recommendation for DC plan and follow-up therapies     Equipment Recommendations  None recommended by PT    Recommendations for Other Services       Precautions / Restrictions Precautions Precautions: Fall Restrictions Weight Bearing Restrictions: No Other Position/Activity Restrictions: WBAT    Mobility  Bed Mobility Overal bed mobility: Modified Independent Bed Mobility: Supine to Sit     Supine to sit: Modified independent (Device/Increase time)        Transfers Overall transfer level: Needs assistance Equipment used: Straight cane Transfers: Sit to/from Stand Sit to Stand: Supervision         General transfer comment: mild instability but no LOB  Ambulation/Gait Ambulation/Gait assistance: Min guard;Supervision Gait Distance (Feet): 650 Feet Assistive device: Straight cane Gait Pattern/deviations: Step-to pattern;Step-through pattern;Shuffle;Trunk flexed     General Gait Details: min cues for posture, noted increased BOS and L foot slap down 2* long term foot drop - pt states has AFO at home   Stairs             Wheelchair Mobility    Modified Rankin (Stroke Patients Only)       Balance Overall balance assessment: Mild deficits observed, not formally tested                                          Cognition Arousal/Alertness: Awake/alert Behavior During Therapy: WFL for tasks assessed/performed Overall Cognitive Status: Within Functional Limits for  tasks assessed                                        Exercises      General Comments        Pertinent Vitals/Pain Pain Assessment: 0-10 Pain Score: 3  Pain Location: L hip Pain Descriptors / Indicators: Sore Pain Intervention(s): Limited activity within patient's tolerance;Monitored during session    Home Living                      Prior Function            PT Goals (current goals can now be found in the care plan section) Acute Rehab PT Goals Patient Stated Goal: Regain IND PT Goal Formulation: With patient Time For Goal Achievement: 02/18/20 Potential to Achieve Goals: Good Progress towards PT goals: Progressing toward goals    Frequency    7X/week      PT Plan Current plan remains appropriate    Co-evaluation              AM-PAC PT "6 Clicks" Mobility   Outcome Measure  Help needed turning from your back to your side while in a flat bed without using bedrails?: None Help needed moving from lying on your back to sitting on the side of a flat bed without  using bedrails?: None Help needed moving to and from a bed to a chair (including a wheelchair)?: A Little Help needed standing up from a chair using your arms (e.g., wheelchair or bedside chair)?: A Little Help needed to walk in hospital room?: A Little Help needed climbing 3-5 steps with a railing? : A Little 6 Click Score: 20    End of Session Equipment Utilized During Treatment: Gait belt Activity Tolerance: Patient tolerated treatment well Patient left: in chair;with call bell/phone within reach;with family/visitor present Nurse Communication: Mobility status PT Visit Diagnosis: Difficulty in walking, not elsewhere classified (R26.2)     Time: 9528-4132 PT Time Calculation (min) (ACUTE ONLY): 25 min  Charges:  $Gait Training: 8-22 mins                     Mauro Kaufmann PT Acute Rehabilitation Services Pager [phone] Office  [phone]    Damone Fancher 02/13/2020, 4:59 PM

## 2020-02-13 NOTE — Plan of Care (Signed)
  Problem: Clinical Measurements: Goal: Respiratory complications will improve Outcome: Progressing   Problem: Clinical Measurements: Goal: Cardiovascular complication will be avoided Outcome: Progressing   Problem: Activity: Goal: Risk for activity intolerance will decrease Outcome: Progressing   Problem: Pain Managment: Goal: General experience of comfort will improve Outcome: Progressing   Problem: Education: Goal: Knowledge of the prescribed therapeutic regimen will improve Outcome: Progressing   Problem: Pain Management: Goal: Pain level will decrease with appropriate interventions Outcome: Progressing

## 2020-02-13 NOTE — Progress Notes (Signed)
Physical Therapy Treatment Patient Details Name: [name] MRN: [id number] DOB: 10-02-1946 Today's Date: 02/13/2020    History of Present Illness Pt s/p I&D of L THR by anterior direct approach.  Pt with hx of Prostate CA, DDD, back surgery with spinal stimulator implant, and L THR with multiple revisions    PT Comments    Pt progressing well with mobility and hopeful for dc home tomorrow - as long as lab work is completed.   Follow Up Recommendations  Follow surgeon's recommendation for DC plan and follow-up therapies     Equipment Recommendations  None recommended by PT    Recommendations for Other Services       Precautions / Restrictions Precautions Precautions: Fall Restrictions Weight Bearing Restrictions: No Other Position/Activity Restrictions: WBAT    Mobility  Bed Mobility Overal bed mobility: Needs Assistance Bed Mobility: Supine to Sit     Supine to sit: Supervision        Transfers Overall transfer level: Needs assistance Equipment used: Straight cane Transfers: Sit to/from Stand Sit to Stand: Min guard;Supervision            Ambulation/Gait Ambulation/Gait assistance: Min Emergency planning/management officer (Feet): 600 Feet Assistive device: Straight cane Gait Pattern/deviations: Step-to pattern;Step-through pattern;Shuffle;Trunk flexed     General Gait Details: min cues for posture   Stairs Stairs: Yes Stairs assistance: Min guard Stair Management: One rail Left;Step to pattern;Forwards;With cane;Alternating pattern Number of Stairs: 7 General stair comments: min cues for sequence   Wheelchair Mobility    Modified Rankin (Stroke Patients Only)       Balance Overall balance assessment: Mild deficits observed, not formally tested                                          Cognition Arousal/Alertness: Awake/alert Behavior During Therapy: WFL for tasks assessed/performed Overall Cognitive Status: Within  Functional Limits for tasks assessed                                        Exercises      General Comments        Pertinent Vitals/Pain Pain Assessment: 0-10 Pain Score: 3  Pain Location: L hip Pain Descriptors / Indicators: Aching;Sore Pain Intervention(s): Limited activity within patient's tolerance;Monitored during session;Premedicated before session;Ice applied    Home Living                      Prior Function            PT Goals (current goals can now be found in the care plan section) Acute Rehab PT Goals Patient Stated Goal: Regain IND PT Goal Formulation: With patient Time For Goal Achievement: 02/18/20 Potential to Achieve Goals: Good Progress towards PT goals: Progressing toward goals    Frequency    7X/week      PT Plan Current plan remains appropriate    Co-evaluation              AM-PAC PT "6 Clicks" Mobility   Outcome Measure  Help needed turning from your back to your side while in a flat bed without using bedrails?: A Little Help needed moving from lying on your back to sitting on the side of a flat bed without using bedrails?: A Little Help  needed moving to and from a bed to a chair (including a wheelchair)?: A Little Help needed standing up from a chair using your arms (e.g., wheelchair or bedside chair)?: A Little Help needed to walk in hospital room?: A Little Help needed climbing 3-5 steps with a railing? : A Little 6 Click Score: 18    End of Session Equipment Utilized During Treatment: Gait belt Activity Tolerance: Patient tolerated treatment well Patient left: in chair;with call bell/phone within reach;with family/visitor present Nurse Communication: Mobility status PT Visit Diagnosis: Difficulty in walking, not elsewhere classified (R26.2)     Time: 1130-1156 PT Time Calculation (min) (ACUTE ONLY): 26 min  Charges:  $Gait Training: 8-22 mins $Therapeutic Activity: 8-22 mins                      Mauro Kaufmann PT Acute Rehabilitation Services Pager [phone] Office [phone]    [name] 02/13/2020, 12:36 PM

## 2020-02-13 NOTE — Progress Notes (Signed)
Dr. Louanne Skye notified fluid culture results. He discussed these findings with family.

## 2020-02-14 ENCOUNTER — Inpatient Hospital Stay: Payer: Self-pay

## 2020-02-14 ENCOUNTER — Telehealth: Payer: Self-pay

## 2020-02-14 ENCOUNTER — Encounter: Payer: Self-pay | Admitting: Family Medicine

## 2020-02-14 DIAGNOSIS — T8452XA Infection and inflammatory reaction due to internal left hip prosthesis, initial encounter: Principal | ICD-10-CM

## 2020-02-14 LAB — BASIC METABOLIC PANEL
Anion gap: 6 (ref 5–15)
BUN: 18 mg/dL (ref 8–23)
CO2: 25 mmol/L (ref 22–32)
Calcium: 9.3 mg/dL (ref 8.9–10.3)
Chloride: 106 mmol/L (ref 98–111)
Creatinine, Ser: 1.18 mg/dL (ref 0.61–1.24)
GFR calc Af Amer: >60 mL/min (ref >60)
GFR calc non Af Amer: >60 mL/min (ref >60)
Glucose, Bld: 112 mg/dL — ABNORMAL HIGH (ref 70–99)
Potassium: 4.6 mmol/L (ref 3.5–5.1)
Sodium: 137 mmol/L (ref 135–145)

## 2020-02-14 LAB — CREATININE, SERUM
Creatinine, Ser: 1.14 mg/dL (ref 0.61–1.24)
GFR calc Af Amer: >60 mL/min (ref >60)
GFR calc non Af Amer: >60 mL/min (ref >60)

## 2020-02-14 LAB — CBC
HCT: 40 % (ref 39.0–52.0)
Hemoglobin: 12.7 g/dL — ABNORMAL LOW (ref 13.0–17.0)
MCH: 29.5 pg (ref 26.0–34.0)
MCHC: 31.8 g/dL (ref 30.0–36.0)
MCV: 92.8 fL (ref 80.0–100.0)
Platelets: 309 10*3/uL (ref 150–400)
RBC: 4.31 MIL/uL (ref 4.22–5.81)
RDW: 13.7 % (ref 11.5–15.5)
WBC: 7 10*3/uL (ref 4.0–10.5)
nRBC: 0 % (ref 0.0–0.2)

## 2020-02-14 LAB — BODY FLUID CULTURE

## 2020-02-14 LAB — C-REACTIVE PROTEIN: CRP: 8.6 mg/dL — ABNORMAL HIGH (ref <1.0)

## 2020-02-14 LAB — SEDIMENTATION RATE: Sed Rate: 40 mm/hr — ABNORMAL HIGH (ref 0–16)

## 2020-02-14 MED ORDER — CEFAZOLIN SODIUM-DEXTROSE 2-4 GM/100ML-% IV SOLN
2.0000 g | Freq: Three times a day (TID) | INTRAVENOUS | Status: DC
  Administered 2020-02-14 – 2020-02-15 (×3): 2 g via INTRAVENOUS
  Filled 2020-02-14 (×3): qty 100

## 2020-02-14 MED ORDER — SODIUM CHLORIDE 0.9% FLUSH: 10.0000 mL | INTRAVENOUS | Status: DC | PRN

## 2020-02-14 MED ORDER — CHLORHEXIDINE GLUCONATE CLOTH 2 % EX PADS
6.0000 | MEDICATED_PAD | Freq: Every day | CUTANEOUS | Status: DC
  Administered 2020-02-14 – 2020-02-15 (×2): 6 via TOPICAL

## 2020-02-14 MED ORDER — HYDROCORTISONE 1 % EX CREA
TOPICAL_CREAM | Freq: Every day | CUTANEOUS | Status: DC | PRN
  Administered 2020-02-14: 1 via TOPICAL
  Filled 2020-02-14 (×2): qty 28

## 2020-02-14 NOTE — Discharge Instructions (Signed)
You can increase your activities as comfort allows. You can get your dressing wet daily in the shower. You can change your dressing at least once or twice if needed. Do expect some drainage as the antibiotic beads slow the dissolve.

## 2020-02-14 NOTE — Progress Notes (Signed)
Patient mentioned a rash under left arm, said it wasn't painful but would occasionally itch and was uncomfortable. Called MD to make aware and received an order for some hydrocortisone cream prn.

## 2020-02-14 NOTE — Progress Notes (Signed)
PHARMACY CONSULT NOTE FOR:  OUTPATIENT  PARENTERAL ANTIBIOTIC THERAPY (OPAT)  Indication: prosthetic joint infection   Regimen: Cefazolin 2g IV q8h  End date: 03/24/2020  IV antibiotic discharge orders are pended. To discharging provider:  please sign these orders via discharge navigator,  Select New Orders & click on the button choice - Manage This Unsigned Work.     Thank you for allowing pharmacy to be a part of this patient's care.  Luiz Ochoa 02/14/2020, 5:47 PM

## 2020-02-14 NOTE — Plan of Care (Signed)
  Problem: Health Behavior/Discharge Planning: Goal: Ability to manage health-related needs will improve Outcome: Progressing   Problem: Clinical Measurements: Goal: Ability to maintain clinical measurements within normal limits will improve Outcome: Progressing Goal: Will remain free from infection Outcome: Progressing Goal: Diagnostic test results will improve Outcome: Progressing   

## 2020-02-14 NOTE — Progress Notes (Addendum)
Pharmacy Antibiotic Note  [name] is a 74 y.o. male admitted on 02/11/2020 with questionable infection of left hip, s/p I&D on 5/7.  Pharmacy has been consulted for vancomycin dosing. Baseline - CRP 34.8, Sed rate 38  Today, 02/14/2020  Day 4 vancomycin total, Day #3 since I&D (source control)  SCr stable  WBC WNL  CRP improved, ESR relatively unchanged  Afebrile  5/7 OR cultures: MSSA from Left Hip  Ortho's note today suggests ID to be consulted  Plan:  Continue Vancomycin 750 mg IV q 12 BUT anticipate change to anti-staphylococcal beta lactam (per susceptibility results) today once seen by ID   Hardware retained, anticipate rifampin will be considered.   Potential interactions:   Rifampin can decrease effect of opioids.    Apalutamide (anti-androgen for h/o prostate cancer).  Not much clinical experience with the use these two medications together but rifampin does reduce apalutamide plasma concentrations.  See comment below from 2019 AHFS - based on final sentence, appears less likely would see clinically significant deleterious effect with this combination but conclusion is based on limited data.   Rifampin Based on pharmacokinetic modeling, concomitant use of the potent CYP3A4 and moderate CYP2C8 inducer rifampin (600 mg daily) and apalutamide is expected to decrease steady-state AUC and peak plasma concentrations of apalutamide by 34 and 25%, respectively, and to decrease steadystate AUC and peak plasma concentrations of the total active forms of apalutamide (i.e., unbound apalutamide plus potency-adjusted unbound N-desmethylapalutamide) by 19 and 15%, respectively. However, no pharmacokinetic interactions were observed between apalutamide and CYP3A4 inducers in a population pharmacokinetic analysis based on limited data  Follow up renal function, culture results, and clinical course   Height: '6\' 3"'  (190.5 cm) Weight: 115.2 kg (254 lb) IBW/kg (Calculated) :  84.5  Temp (24hrs), Avg:98.7 F (37.1 C), Min:98.5 F (36.9 C), Max:98.9 F (37.2 C)  Recent Labs  Lab 02/09/20 1434 02/11/20 1319 02/14/20 0330 02/14/20 0748  WBC 6.3  --   --  7.0  CREATININE  --  1.21 1.14 1.18    Estimated Creatinine Clearance: 74.1 mL/min (by C-G formula based on SCr of 1.18 mg/dL).    Allergies  Allergen Reactions  . Adhesive [Tape] Rash    Paper tape is ok    Antimicrobials this admission: 5/7 Vancomycin >>  Dose adjustments this admission:  Microbiology results: 5/7 Hip: MSSA  Thank you for allowing pharmacy to be a part of this patient's care.  Doreene Eland, PharmD, BCPS.   Work Cell: [phone] 02/14/2020 10:22 AM

## 2020-02-14 NOTE — Progress Notes (Signed)
PT Cancellation Note  Patient Details Name: [name] MRN: [id number] DOB: 02-Jul-1946   Cancelled Treatment:    Reason Eval/Treat Not Completed:Observed pt walking the unit with family. Will check back another day.    Doreatha Massed, PT Acute Rehabilitation

## 2020-02-14 NOTE — Telephone Encounter (Signed)
Sent as FYI.  Received fax that patient recently admitted on 02/11/20

## 2020-02-14 NOTE — Progress Notes (Addendum)
Patient ID: [name], male   DOB: 1946/04/01, 74 y.o.   MRN: [id number]         [facility] Infectious Disease    Date of Admission:  02/11/2020           Day 4 vancomycin       Reason for Consult: MSSA left prosthetic hip infection    Referring Provider: Dr. 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  It is extremely unlikely that this is curable.  I will treat him with 6 weeks of IV cefazolin and consider adding oral rifampin (will need to consider drug drug interactions with his prostate cancer medication).  Following completion of IV cefazolin I would recommend converting to long-term suppressive oral cephalexin.  Plan: 1. Change vancomycin to cefazolin 2. Will consider the addition of oral rifampin  Diagnosis: Prosthetic hip infection  Culture Result: MSSA  Allergies  Allergen Reactions  . Adhesive [Tape] Rash    Paper tape is ok    OPAT Orders Discharge antibiotics to be given via PICC line Discharge antibiotics: Per pharmacy protocol cefazolin  Duration: 6 weeks End Date: 03/24/2020  [facility] Care Per Protocol:  Home health RN for IV administration and teaching; PICC line care and labs.    Labs weekly while on IV antibiotics: _x_ CBC with differential _x_ BMP __ CMP _x_ CRP _x_ ESR __ Vancomycin trough __ CK  _x_ Please pull PIC at completion of IV antibiotics __ Please leave PIC in place until doctor has seen patient or been notified  Fax weekly labs to [phone]  Clinic Follow Up Appt: 03/09/2020  Principal Problem:   Prosthetic joint infection of left hip ([place]) Active Problems:   Obesity, Class I, BMI 30-34.9   Venous (peripheral) insufficiency   Osteoarthrosis involving lower leg   Prosthetic hip infection, subsequent encounter   Scheduled Meds: . apalutamide  360 mg Oral Daily  . aspirin  81 mg Oral BID PC  . Chlorhexidine Gluconate Cloth  6 each Topical Daily  . darifenacin   15 mg Oral Daily  . docusate sodium  100 mg Oral BID  . pantoprazole  40 mg Oral Daily   Continuous Infusions: . sodium chloride Stopped (02/14/20 0403)  . methocarbamol (ROBAXIN) IV    . vancomycin 750 mg (02/14/20 0823)   PRN Meds:.acetaminophen, diphenhydrAMINE, hydrocortisone cream, HYDROmorphone (DILAUDID) injection, methocarbamol **OR** methocarbamol (ROBAXIN) IV, metoCLOPramide **OR** metoCLOPramide (REGLAN) injection, ondansetron **OR** ondansetron (ZOFRAN) IV, oxyCODONE, oxyCODONE, sodium chloride flush  HPI: [name] is a 74 y.o. male who underwent left total hip arthroplasty in 2008.  He did well until he developed failure of the acetabular component requiring revision in September 2017.  Operative cultures grew MSSA.  He recalls being on a 4-week course of IV antibiotic doing surgery.  He did well until late 2018.  He required repeat revision in January of that year and cultures grew MSSA again.  He was treated with a 6-week course of IV antibiotics and did well until recently starting to have more pain.  MRI showed a fluid collection in the left hip.  He was admitted and underwent incision and drainage and antibiotic bead placement on 02/11/2020.  Cultures have grown MSSA again.  He had a PICC placed earlier today.   Review of Systems: Review of Systems  Constitutional: Negative for chills, diaphoresis and fever.  Gastrointestinal: Negative for abdominal pain, diarrhea, nausea and vomiting.  Musculoskeletal: Positive for joint  pain.    Past Medical History:  Diagnosis Date  . Anterior dislocation of right shoulder 09/2013   Reduced in ED under sedation.  Dislocated posteriorly 2015, ? partial RC tear, has f/u planned with Dr. Marlou Sa--? surgery? possible plan for 2016.  [name] Blood donor, whole blood    Has donated approx 30 gallons in his lifetime  . Cancer [facility]) 2012   Prostate cancer   . Chronic left hip pain    Severe DJD.  THA 2008.  Left acetabular revision in 2017 AND  2019.  [name] Chronic pain syndrome    Candidate for spinal cord stimulator (03/2017)  . Chronic renal insufficiency, stage II (mild)   . Chronic venous insufficiency    +compression hose  . DDD (degenerative disc disease), lumbar 2017   Dr. Noe Gens ortho.  He is now wearing a new leg brace.  Has had ESI and will get another.  . Diverticulosis    +redundant colon  . DVT (deep venous thrombosis) ([place]) 06/14/2016   provoked, bilateral LL's; in post-op setting s/p hip surgery.  Xarelto x 69moat full dosing, then 6 more months at 1/2 dosing, then stopped xarelto.    . Foot drop, left    s/p hip fracture 1969  . Hx of adenomatous colonic polyps    polypectomy 2007; 2012; 2015; 05/2019; recall 520yr . Hypercholesterolemia 02/2017   Recommended statin 02/10/17 but pt declined, wanted to do TLC trial first.  . Osteoarthritis    knees and hips primarily  . Post laminectomy syndrome    improved with spinal cord stim-->stim to be removed summer/fall 2019.  . 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 Stable PSA urol f/u thru 2018. 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  . [place]   MSSA: IV abx x 6 wks, plan for long term oral abx after IV abx (as of 11/2017 ortho f/u the plan was for at least 1 more year of oral antibiotics).  . Urge incontinence    vesicare helpful    Social History   Tobacco Use  . Smoking status: Never Smoker  . Smokeless tobacco: Never Used  Substance Use Topics  . Alcohol use: No  . Drug use: No    Family History  Problem Relation Age of Onset  . Lung cancer Mother   . Heart disease Father   . Colon cancer Neg Hx   . Esophageal cancer Neg Hx   . Rectal cancer Neg Hx   . Stomach cancer Neg Hx   . Colon polyps Neg Hx    Allergies  Allergen Reactions  . Adhesive [Tape] Rash    Paper tape is  ok    OBJECTIVE: Blood pressure 132/68, pulse (!) 57, temperature 98.1 F (36.7 C), resp. rate 19, height 6' 3" (1.905 m), weight 115.2 kg, SpO2 97 %.  Physical Exam Constitutional:      Comments: Is resting quietly in bed.  His wife is visiting.  Skin:    Comments: He has a new right arm PICC.  Psychiatric:        Mood and Affect: Mood normal.     Lab Results Lab Results  Component Value Date   WBC 7.0 02/14/2020   HGB 12.7 (L) 02/14/2020   HCT 40.0 02/14/2020   MCV 92.8 02/14/2020   PLT 309 02/14/2020  Lab Results  Component Value Date   CREATININE 1.18 02/14/2020   BUN 18 02/14/2020   NA 137 02/14/2020   K 4.6 02/14/2020   CL 106 02/14/2020   CO2 25 02/14/2020    Lab Results  Component Value Date   ALT 9 04/22/2019   AST 11 04/22/2019   ALKPHOS 88 04/22/2019   BILITOT 0.5 04/22/2019     Microbiology: Recent Results (from the past 240 hour(s))  SARS CORONAVIRUS 2 (TAT 6-24 HRS) Nasopharyngeal Nasopharyngeal Swab     Status: None   Collection Time: 02/08/20  9:19 AM   Specimen: Nasopharyngeal Swab  Result Value Ref Range Status   SARS Coronavirus 2 NEGATIVE NEGATIVE Final    Comment: (NOTE) SARS-CoV-2 target nucleic acids are NOT DETECTED. The SARS-CoV-2 RNA is generally detectable in upper and lower respiratory specimens during the acute phase of infection. Negative results do not preclude SARS-CoV-2 infection, do not rule out co-infections with other pathogens, and should not be used as the sole basis for treatment or other patient management decisions. Negative results must be combined with clinical observations, patient history, and epidemiological information. The expected result is Negative. Fact Sheet for Patients: SugarRoll.be Fact Sheet for Healthcare Providers: https://www.woods-mathews.com/ This test is not yet approved or cleared by the [place] FDA and  has been authorized for detection  and/or diagnosis of SARS-CoV-2 by FDA under an Emergency Use Authorization (EUA). This EUA will remain  in effect (meaning this test can be used) for the duration of the COVID-19 declaration under Section 56 4(b)(1) of the Act, 21 U.S.C. section 360bbb-3(b)(1), unless the authorization is terminated or revoked sooner. Performed at [facility], [place] [address] [place], [place] [address]   Anaerobic culture     Status: None (Preliminary result)   Collection Time: 02/11/20  9:11 AM   Specimen: Joint, Hip  Result Value Ref Range Status   Specimen Description   Final    HIP LEFT  FLUID Performed at [place] [address], [place], [place] [address]    Special Requests   Final    NONE Performed at [facility], [place] [address] [place], [place] [address]    Culture   Final    NO ANAEROBES ISOLATED; CULTURE IN PROGRESS FOR 5 DAYS   Report Status PENDING  Incomplete  Body fluid culture     Status: None   Collection Time: 02/11/20  9:11 AM   Specimen: Joint, Hip  Result Value Ref Range Status   Specimen Description   Final    HIP LEFT Performed at [facility], [place] [address], [place], [place] [address]    Special Requests   Final    NONE Performed at [facility], [place] [address] [place], [place] [address]    Gram Stain   Final    RARE WBC PRESENT, PREDOMINANTLY PMN NO ORGANISMS SEEN Gram Stain Report Called to,Read Back By and Verified With: YJWLK,H. RN _0  ON 5.7.2021 BY [facility] Performed at [facility], [place] [address] [place], [place] [address]    Culture   Final    FEW STAPHYLOCOCCUS AUREUS CRITICAL RESULT CALLED TO, READ BACK BY AND VERIFIED WITH: J. MILLER RN, AT [phone] 02/13/20 BY D. Victoriano Lain REGARDING CULTURE GROWTH Performed at [facility], [place] [address], [place], [place] [address]    Report Status 02/14/2020 FINAL  Final   Organism ID, Bacteria  STAPHYLOCOCCUS AUREUS  Final  Susceptibility   Staphylococcus aureus - MIC*    CIPROFLOXACIN <=0.5 SENSITIVE Sensitive     ERYTHROMYCIN <=0.25 SENSITIVE Sensitive     GENTAMICIN <=0.5 SENSITIVE Sensitive     OXACILLIN 0.5 SENSITIVE Sensitive     TETRACYCLINE <=1 SENSITIVE Sensitive     VANCOMYCIN <=0.5 SENSITIVE Sensitive     TRIMETH/SULFA <=10 SENSITIVE Sensitive     CLINDAMYCIN <=0.25 SENSITIVE Sensitive     RIFAMPIN <=0.5 SENSITIVE Sensitive     Inducible Clindamycin NEGATIVE Sensitive     * FEW STAPHYLOCOCCUS AUREUS  Body fluid culture     Status: None   Collection Time: 02/11/20  9:15 AM   Specimen: Joint, Hip  Result Value Ref Range Status   Specimen Description   Final    HIP LEFT 2 FLUID Performed at [facility] [address], [place], [place] [address]    Special Requests   Final    NONE Performed at [facility], [place] [address], [place], [place] [address]    Gram Stain   Final    ABUNDANT WBC PRESENT, PREDOMINANTLY PMN NO ORGANISMS SEEN Gram Stain Report Called to,Read Back By and Verified With: BSJGG,E. RN _0  ON 5.7.2021 BY [facility] Performed at [facility], [place] [address] [place], [place] [address]    Culture   Final    FEW STAPHYLOCOCCUS AUREUS SUSCEPTIBILITIES TO FOLLOW CRITICAL RESULT CALLED TO, READ BACK BY AND VERIFIED WITH: J. MILLER RN, AT [phone] 02/13/20 BY D. Victoriano Lain REGARDING CULTURE GROWTH Performed at [facility], [place] [address], [place], [place] [address]    Report Status 02/14/2020 FINAL  Final   Organism ID, Bacteria STAPHYLOCOCCUS AUREUS  Final      Susceptibility   Staphylococcus aureus - MIC*    CIPROFLOXACIN <=0.5 SENSITIVE Sensitive     ERYTHROMYCIN <=0.25 SENSITIVE Sensitive     GENTAMICIN <=0.5 SENSITIVE Sensitive     OXACILLIN 0.5 SENSITIVE Sensitive     TETRACYCLINE <=1 SENSITIVE Sensitive     VANCOMYCIN <=0.5 SENSITIVE Sensitive     TRIMETH/SULFA <=10 SENSITIVE  Sensitive     CLINDAMYCIN <=0.25 SENSITIVE Sensitive     RIFAMPIN <=0.5 SENSITIVE Sensitive     Inducible Clindamycin NEGATIVE Sensitive     * FEW STAPHYLOCOCCUS AUREUS  Anaerobic culture     Status: None (Preliminary result)   Collection Time: 02/11/20  9:15 AM   Specimen: Joint, Hip  Result Value Ref Range Status   Specimen Description   Final    HIP LEFT 2 FLUID Performed at [place] [address], [place], [place] [address]    Special Requests   Final    NONE Performed at [facility], [place] [address] [place], [place] [address]    Culture   Final    NO ANAEROBES ISOLATED; CULTURE IN PROGRESS FOR 5 DAYS   Report Status PENDING  Incomplete    Michel Bickers, MD [facility] [phone] pager   [phone] cell 02/14/2020, 5:12 PM

## 2020-02-14 NOTE — Care Management Important Message (Signed)
Important Message  Patient Details IM Letter given to [place] Case Manager to present to the Patient Name: [name] MRN: [id number] Date of Birth: 24-Feb-1946   Medicare Important Message Given:  Yes     Kerin Salen 02/14/2020, 1:05 PM

## 2020-02-14 NOTE — Progress Notes (Signed)
Patient ID: [name], male   DOB: 08-29-1946, 74 y.o.   MRN: [id number] The patient's cultures did unfortunately grow few staph aureus. Susceptibilities are pending. I did talk to the patient about this. We are looking at a situation of hopefully long-term suppression. Given the patient's comorbidities he would rather not have an excision arthroplasty if there is the potential for suppression. We will order a PICC line today as well as consult the Infectious Disease service for antibiotic type and duration. Once we have a plan, hopefully will be able to discharge him by tomorrow.

## 2020-02-14 NOTE — Progress Notes (Signed)
Peripherally Inserted Central Catheter Placement  The IV Nurse has discussed with the patient and/or persons authorized to consent for the patient, the purpose of this procedure and the potential benefits and risks involved with this procedure.  The benefits include less needle sticks, lab draws from the catheter, and the patient may be discharged home with the catheter. Risks include, but not limited to, infection, bleeding, blood clot (thrombus formation), and puncture of an artery; nerve damage and irregular heartbeat and possibility to perform a PICC exchange if needed/ordered by physician.  Alternatives to this procedure were also discussed.  Bard Power PICC patient education guide, fact sheet on infection prevention and patient information card has been provided to patient /or left at bedside.    PICC Placement Documentation  PICC Single Lumen 02/14/20 PICC Right Cephalic 45 cm 0 cm (Active)  Indication for Insertion or Continuance of Line Home intravenous therapies (PICC only);Prolonged intravenous therapies 02/14/20 1400  Exposed Catheter (cm) 0 cm 02/14/20 1400  Site Assessment Clean;Dry;Intact 02/14/20 1400  Line Status Flushed;Saline locked;Blood return noted 02/14/20 1400  Dressing Type Transparent;Securing device 02/14/20 1400  Dressing Status Clean;Dry;Intact;Antimicrobial disc in place 02/14/20 1400  Safety Lock Not Applicable [id number]  Dressing Intervention New dressing 02/14/20 1400  Dressing Change Due 02/21/20 02/14/20 1400       Enos Fling 02/14/2020, 2:23 PM

## 2020-02-15 ENCOUNTER — Other Ambulatory Visit: Payer: Self-pay | Admitting: Internal Medicine

## 2020-02-15 ENCOUNTER — Telehealth: Payer: Self-pay | Admitting: Internal Medicine

## 2020-02-15 DIAGNOSIS — T8452XD Infection and inflammatory reaction due to internal left hip prosthesis, subsequent encounter: Secondary | ICD-10-CM

## 2020-02-15 LAB — FUNGUS STAIN

## 2020-02-15 LAB — FUNGAL STAIN REFLEX

## 2020-02-15 MED ORDER — HEPARIN SOD (PORK) LOCK FLUSH 100 UNIT/ML IV SOLN
250.0000 [IU] | INTRAVENOUS | Status: AC | PRN
  Administered 2020-02-15: 250 [IU]
  Filled 2020-02-15: qty 2.5

## 2020-02-15 MED ORDER — CEFAZOLIN IV (FOR PTA / DISCHARGE USE ONLY)
2.0000 g | Freq: Three times a day (TID) | INTRAVENOUS | 0 refills | Status: DC
Start: 2020-02-15 — End: 2020-03-09

## 2020-02-15 MED ORDER — RIFAMPIN 300 MG PO CAPS: 600.0000 mg | ORAL_CAPSULE | Freq: Every day | ORAL | 1 refills | Status: DC

## 2020-02-15 NOTE — Discharge Summary (Signed)
Patient ID: [name] MRN: [id number] DOB/AGE: 75/23/1947 75 y.o.  Admit date: 02/11/2020 Discharge date: 02/15/2020  Admission Diagnoses:  Principal Problem:   Prosthetic joint infection of left hip ([name]) Active Problems:   Obesity, Class I, BMI 30-34.9   Venous (peripheral) insufficiency   Osteoarthrosis involving lower leg   Prosthetic hip infection, subsequent encounter   Discharge Diagnoses:  Same  Past Medical History:  Diagnosis Date  . Anterior dislocation of right shoulder 09/2013   Reduced in ED under sedation.  Dislocated posteriorly 2015, ? partial RC tear, has f/u planned with Dr. Marlou Sa--? surgery? possible plan for 2016.  [name] Blood donor, whole blood    Has donated approx 30 gallons in his lifetime  . Cancer [facility]) 2012   Prostate cancer   . Chronic left hip pain    Severe DJD.  THA 2008.  Left acetabular revision in 2017 AND 2019.  [name] Chronic pain syndrome    Candidate for spinal cord stimulator (03/2017)  . Chronic renal insufficiency, stage II (mild)   . Chronic venous insufficiency    +compression hose  . DDD (degenerative disc disease), lumbar 2017   Dr. Noe Gens ortho.  He is now wearing a new leg brace.  Has had ESI and will get another.  . Diverticulosis    +redundant colon  . DVT (deep venous thrombosis) ([place]) 06/14/2016   provoked, bilateral LL's; in post-op setting s/p hip surgery.  Xarelto x 30moat full dosing, then 6 more months at 1/2 dosing, then stopped xarelto.    . Foot drop, left    s/p hip fracture 1969  . Hx of adenomatous colonic polyps    polypectomy 2007; 2012; 2015; 05/2019; recall 545yr . Hypercholesterolemia 02/2017   Recommended statin 02/10/17 but pt declined, wanted to do TLC trial first.  . Osteoarthritis    knees and hips primarily  . Post laminectomy syndrome    improved with spinal cord stim-->stim to be removed summer/fall 2019.  . 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 Stable PSA urol f/u thru 2018. 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  . Prosthetic hip infection ([facility] [place]01/2019; 02/2020   MSSA; admitted for I&D 02/11/20->plan for longterm IV abx after  . Urge incontinence    vesicare helpful    Surgeries: Procedure(s): IRRIGATION AND DEBRIDEMENT LEFT HIP WITH ANTIBIOTIC SPACER PLACEMENT on 02/11/2020   Consultants:   Discharged Condition: Improved  Hospital Course: DwESTEVAN KERSHs an [age].o. male who was admitted 02/11/2020 for operative treatment ofProsthetic joint infection of left hip ([place] Patient has severe unremitting pain that affects sleep, daily activities, and work/hobbies. After pre-op clearance the patient was taken to the operating room on 02/11/2020 and underwent  Procedure(s): IRRIGATION AND DEBRIDEMENT LEFT HIP WITH ANTIBIOTIC SPACER PLACEMENT.    Patient was given perioperative antibiotics:  Anti-infectives (From admission, onward)   Start     Dose/Rate Route Frequency Ordered Stop   02/15/20 0000  ceFAZolin (ANCEF) IVPB     2 g Intravenous Every 8 hours 02/15/20 0724 03/24/20 2359   02/14/20 2000  ceFAZolin (ANCEF) IVPB 2g/100 mL premix     2 g 200 mL/hr over 30 Minutes Intravenous Every 8 hours 02/14/20 1721     02/11/20 2200  vancomycin (VANCOREADY) IVPB 750 mg/150 mL  Status:  Discontinued     750 mg 150  mL/hr over 60 Minutes Intravenous Every 12 hours 02/11/20 1426 02/14/20 1721   02/11/20 1230  vancomycin (VANCOCIN) IVPB 1000 mg/200 mL premix     1,000 mg 200 mL/hr over 60 Minutes Intravenous  Once 02/11/20 1222 02/11/20 1401   02/11/20 0930  vancomycin (VANCOCIN) powder  Status:  Discontinued       As needed 02/11/20 0931 02/11/20 1201   02/11/20 0715  vancomycin (VANCOREADY) IVPB 1500 mg/300 mL  Status:  Discontinued     1,500 mg 150 mL/hr over 120 Minutes Intravenous  Once 02/11/20 0704 02/11/20 1412       Patient was given  sequential compression devices, early ambulation, and chemoprophylaxis to prevent DVT.  Patient benefited maximally from hospital stay and there were no complications.    Recent vital signs:  Patient Vitals for the past 24 hrs:  BP Temp Temp src Pulse Resp SpO2  02/15/20 0506 (!) 144/69 98.5 F (36.9 C) Oral (!) 53 20 96 %  02/14/20 2108 (!) 144/61 98.2 F (36.8 C) Oral (!) 57 16 99 %     Recent laboratory studies:  Recent Labs    02/14/20 0330 02/14/20 0748  WBC  --  7.0  HGB  --  12.7*  HCT  --  40.0  PLT  --  309  NA  --  137  K  --  4.6  CL  --  106  CO2  --  25  BUN  --  18  CREATININE 1.14 1.18  GLUCOSE  --  112*  CALCIUM  --  9.3     Discharge Medications:   Allergies as of 02/15/2020      Reactions   Adhesive [tape] Rash   Paper tape is ok      Medication List    TAKE these medications   acetaminophen 500 MG tablet Commonly known as: TYLENOL Take 1,000 mg by mouth 2 (two) times daily as needed for moderate pain or headache.   aspirin EC 81 MG tablet Take 81 mg by mouth daily.   CALCIUM + D3 PO Take 1 tablet by mouth daily.   ceFAZolin  IVPB Commonly known as: ANCEF Inject 2 g into the vein every 8 (eight) hours. Indication: prosthetic joint infection  First Dose: Yes Last Day of Therapy:  03/24/2020 Labs - Once weekly:  CBC/D, BMP, ESR, and CRP Method of administration: IV Push Method of administration may be changed at the discretion of home infusion pharmacist based upon assessment of the patient and/or caregiver's ability to self-administer the medication ordered.   Erleada 60 MG tablet Generic drug: apalutamide Take 360 mg by mouth daily.   HYDROcodone-acetaminophen 7.5-325 MG tablet Commonly known as: NORCO Take 1-2 tablets by mouth every 6 (six) hours as needed for moderate pain.   Trospium Chloride 60 MG Cp24 Take 60 mg by mouth daily.            Discharge Care Instructions  (From admission, onward)         Start      Ordered   02/15/20 0000  Change dressing on IV access line weekly and PRN  (Home infusion instructions - [facility] )     02/15/20 0724          Diagnostic Studies: MR Hip Left w/o contrast  Result Date: 02/03/2020 CLINICAL DATA:  Chronic persistent left hip pain. Three prior left hip replacements. EXAM: MR OF THE LEFT HIP WITHOUT CONTRAST TECHNIQUE: Multiplanar, multisequence MR imaging was performed.  No intravenous contrast was administered. COMPARISON:  Radiographs dated 01/11/2020 and 10/18/2019 and hip MRI dated 10/21/2016 FINDINGS: Bones: No acute bone abnormalities. Bone detail of the left hip is obscured by artifact from the hip prosthesis. Joint or bursal effusion Joint effusion and bursae: There is a prominent left hip effusion which extends into the left greater trochanteric bursa and extends superiorly between the left gluteus minimus and medius muscles. New fluid in the left iliopsoas bursa which may originate from the joint. Fluid also extends anteriorly along the left femoral shaft for several cm. These findings are new since the prior study of 10/21/2016. Muscles and tendons Muscles and tendons: There is chronic atrophy of several muscles around the left hip as described on the prior study. This appears unchanged. Other findings Miscellaneous: There is an chronic abnormal appearance of the left sciatic nerve with fatty infiltration and enlargement of the nerve in the pelvis extending through the sciatic notch. The left sciatic nerve is no longer on large distal to the sciatic notch, as it was on the prior study. IMPRESSION: 1. New prominent left hip effusion extending into the left greater trochanteric bursa and between the left gluteus minimus and medius muscles. New left iliopsoas bursitis, possibly representing extension of the joint effusion. 2. Chronic atrophy of several muscles around the left hip. 3. Chronic abnormal appearance of the left sciatic nerve with fatty  infiltration and enlargement of the nerve in the pelvis extending through the sciatic notch. Electronically Signed   By: Lorriane Shire M.D.   On: 02/03/2020 16:14   XR C-ARM NO REPORT  Result Date: 01/24/2020 Please see Notes tab for imaging impression.  [place] EKG SITE RITE  Result Date: 02/14/2020 If Site Rite image not attached, placement could not be confirmed due to current cardiac rhythm.   Disposition: Discharge disposition: 01-Home or Self Care       Discharge Instructions    [facility] pharmacist to adjust dose for Vancomycin, Aminoglycosides and other anti-infective therapies as requested by physician.   Complete by: As directed    [facility] to provide Cath Flo 24m   Complete by: As directed    Administer for PICC line occlusion and as ordered by physician for other access device issues.   Anaphylaxis Kit: Provided to treat any anaphylactic reaction to the medication being provided to the patient if First Dose or when requested by physician   Complete by: As directed    Epinephrine 181mml vial / amp: Administer 0.18m88m0.18ml55mubcutaneously once for moderate to severe anaphylaxis, nurse to call physician and pharmacy when reaction occurs and call 911 if needed for immediate care   Diphenhydramine 50mg29mIV vial: Administer 25-50mg 41mM PRN for first dose reaction, rash, itching, mild reaction, nurse to call physician and pharmacy when reaction occurs   Sodium Chloride 0.9% NS 500ml I818mdminister if needed for hypovolemic blood pressure drop or as ordered by physician after call to physician with anaphylactic reaction   Change dressing on IV access line weekly and PRN   Complete by: As directed    Flush IV access with Sodium Chloride 0.9% and Heparin 10 units/ml or 100 units/ml   Complete by: As directed    Home infusion instructions - [facility]   Complete by: As directed    Instructions: Flush IV access with Sodium Chloride 0.9% and  Heparin 10units/ml or 100units/ml   Change dressing on IV access line: Weekly and PRN   Instructions Cath Flo 2mg:47m  Administer for PICC Line occlusion and as ordered by physician for other access device   [facility] pharmacist to adjust dose for: Vancomycin, Aminoglycosides and other anti-infective therapies as requested by physician   Method of administration may be changed at the discretion of home infusion pharmacist based upon assessment of the patient and/or caregiver's ability to self-administer the medication ordered   Complete by: As directed       Follow-up Information    Mcarthur Rossetti, MD. Schedule an appointment as soon as possible for a visit in 2 week(s).   Specialty: Orthopedic Surgery Contact information: [place] [address] [phone]        Care, [facility] Follow up.   Specialty: Home Health Services Why: will provide home health nursing Contact information: [place] [address] [phone]            Signed: Mcarthur Rossetti 02/15/2020, 4:31 PM

## 2020-02-15 NOTE — Telephone Encounter (Signed)
Festus Aloe, MD  Michel Bickers, MD  Nyxon Strupp - rifampin is fine with me to add. Just have him hold the apalutamide for the next 6 weeks. That shouldn't make any difference with the prostate cancer treatment in the long run. He has labs 02/21/2020 and I can let you know if we need to add apalutamide back or some other option.   Thanks,   Matt       Previous Messages   ----- Message -----  From: Michel Bickers, MD  Sent: 02/15/2020 10:16 AM EDT  To: Festus Aloe, MD  Subject: Rebeca Allegra,   [name] was recently hospitalized with relapsed MSSA left prosthetic hip infection. He is going home today on IV cefazolin. I would normally add oral rifampin given the involvement of hardware but there is a potential drug drug interaction between rifampin and his Erleada. I have included the summary of this potential interaction provided by our ID pharmacist, Mina Marble.   Apalutamide (anti-androgen for h/o prostate cancer). Not much clinical experience with the use these two medications together but rifampin does reduce apalutamide plasma concentrations. See comment below from 2019 AHFS - based on final sentence, appears less likely would see clinically significant deleterious effect with this combination but conclusion is based on limited data.   Rifampin Based on pharmacokinetic modeling, concomitant use of the potent CYP3A4 and moderate CYP2C8 inducer rifampin (600 mg daily) and apalutamide is expected to decrease steady-state AUC and peak plasma concentrations of apalutamide by 34 and 25%, respectively, and to decrease steadystate AUC and peak plasma concentrations of the total active forms of apalutamide (i.e., unbound apalutamide plus potency-adjusted unbound N-desmethylapalutamide) by 19 and 15%, respectively. However, no pharmacokinetic interactions were observed between apalutamide and CYP3A4 inducers in a population pharmacokinetic  analysis based on limited data.   I am considering using rifampin for the first 6 weeks of IV antibiotic therapy but would certainly want to get your input beforehand. Please let me know which you think.  [name]  [phone]

## 2020-02-15 NOTE — Progress Notes (Signed)
Patient ID: [name], male   DOB: 02-28-1946, 74 y.o.   MRN: [id number] No acute changes.  Feels good and minimal pain.  His left hip dressing is clean and dry.  His CR-P came down.  I appreciate Dr. Hale Bogus assistance with the patient from an ID standpoint.  Can be discharged to home today on IV antibiotics.  Has his PICC line.

## 2020-02-15 NOTE — Plan of Care (Signed)
Patient discharged home in stable condition 

## 2020-02-15 NOTE — Progress Notes (Addendum)
Patient ID: [name], male   DOB: 1945-12-05, 74 y.o.   MRN: [id number]         [facility]  Date of Admission:  02/11/2020    Total days of antibiotics 5        Day 1 cefazolin         ASSESSMENT: He is doing well on therapy for relapsed MSSA prosthetic hip infection.  He will discharge home today on IV cefazolin.  I have reviewed the potential drug drug interaction between rifampin and his antiandrogen drug, Erleada, with our ID pharmacist, Mina Marble and I have messaged his urologist, Eda Keys, to get his opinion about using rifampin for the initial 6-weeks of therapy.  Addendum: I was able to speak with Dr. Junious Silk and he agreed with having Mr. Zupko start oral rifampin now.  He also instructed Mr. Minas to stop Erleada while on rifampin.  PLAN: 1. Discharge today on IV cefazolin 2. Start oral rifampin 3. Follow-up with me on 03/09/2020  Principal Problem:   Prosthetic joint infection of left hip (HCC) Active Problems:   Obesity, Class I, BMI 30-34.9   Venous (peripheral) insufficiency   Osteoarthrosis involving lower leg   Prosthetic hip infection, subsequent encounter   Scheduled Meds: . apalutamide  360 mg Oral Daily  . aspirin  81 mg Oral BID PC  . Chlorhexidine Gluconate Cloth  6 each Topical Daily  . darifenacin  15 mg Oral Daily  . docusate sodium  100 mg Oral BID  . pantoprazole  40 mg Oral Daily   Continuous Infusions: . sodium chloride 20 mL/hr at 02/15/20 0600  .  ceFAZolin (ANCEF) IV Stopped (02/15/20 0539)  . methocarbamol (ROBAXIN) IV     PRN Meds:.acetaminophen, diphenhydrAMINE, hydrocortisone cream, HYDROmorphone (DILAUDID) injection, methocarbamol **OR** methocarbamol (ROBAXIN) IV, metoCLOPramide **OR** metoCLOPramide (REGLAN) injection, ondansetron **OR** ondansetron (ZOFRAN) IV, oxyCODONE, oxyCODONE, sodium chloride flush   SUBJECTIVE: He is sitting up, dressed and ready to go home.  He has been up  walking in the hallway this morning.  Review of Systems: Review of Systems  Constitutional: Negative for fever.  Gastrointestinal: Negative for abdominal pain, diarrhea, nausea and vomiting.  Musculoskeletal: Positive for joint pain.    Allergies  Allergen Reactions  . Adhesive [Tape] Rash    Paper tape is ok    OBJECTIVE: Vitals:   02/14/20 0622 02/14/20 1305 02/14/20 2108 02/15/20 0506  BP: 126/65 132/68 (!) 144/61 (!) 144/69  Pulse: (!) 54 (!) 57 (!) 57 (!) 53  Resp: 16 19 16 20   Temp: 98.5 F (36.9 C) 98.1 F (36.7 C) 98.2 F (36.8 C) 98.5 F (36.9 C)  TempSrc: Oral  Oral Oral  SpO2: 97% 97% 99% 96%  Weight:      Height:       Body mass index is 31.75 kg/m.  Physical Exam Constitutional:      Comments: He is in good spirits.  Skin:    Comments: Right arm PICC site looks good.     Lab Results Lab Results  Component Value Date   WBC 7.0 02/14/2020   HGB 12.7 (L) 02/14/2020   HCT 40.0 02/14/2020   MCV 92.8 02/14/2020   PLT 309 02/14/2020    Lab Results  Component Value Date   CREATININE 1.18 02/14/2020   BUN 18 02/14/2020   NA 137 02/14/2020   K 4.6 02/14/2020   CL 106 02/14/2020   CO2 25 02/14/2020    Lab Results  Component Value Date   ALT 9 04/22/2019   AST 11 04/22/2019   ALKPHOS 88 04/22/2019   BILITOT 0.5 04/22/2019     Microbiology: Recent Results (from the past 240 hour(s))  SARS CORONAVIRUS 2 (TAT 6-24 HRS) Nasopharyngeal Nasopharyngeal Swab     Status: None   Collection Time: 02/08/20  9:19 AM   Specimen: Nasopharyngeal Swab  Result Value Ref Range Status   SARS Coronavirus 2 NEGATIVE NEGATIVE Final    Comment: (NOTE) SARS-CoV-2 target nucleic acids are NOT DETECTED. The SARS-CoV-2 RNA is generally detectable in upper and lower respiratory specimens during the acute phase of infection. Negative results do not preclude SARS-CoV-2 infection, do not rule out co-infections with other pathogens, and should not be used as the sole  basis for treatment or other patient management decisions. Negative results must be combined with clinical observations, patient history, and epidemiological information. The expected result is Negative. Fact Sheet for Patients: SugarRoll.be Fact Sheet for Healthcare Providers: https://www.woods-mathews.com/ This test is not yet approved or cleared by the [place] FDA and  has been authorized for detection and/or diagnosis of SARS-CoV-2 by FDA under an Emergency Use Authorization (EUA). This EUA will remain  in effect (meaning this test can be used) for the duration of the COVID-19 declaration under Section 56 4(b)(1) of the Act, 21 U.S.C. section 360bbb-3(b)(1), unless the authorization is terminated or revoked sooner. Performed at [facility], [place] [address] [place], [place] [address]   Anaerobic culture     Status: None (Preliminary result)   Collection Time: 02/11/20  9:11 AM   Specimen: Joint, Hip  Result Value Ref Range Status   Specimen Description   Final    HIP LEFT  FLUID Performed at [place] [address], [place], [place] [address]    Special Requests   Final    NONE Performed at [facility], [place] [address], [place], [place] [address]    Culture   Final    NO ANAEROBES ISOLATED; CULTURE IN PROGRESS FOR 5 DAYS   Report Status PENDING  Incomplete  Body fluid culture     Status: None   Collection Time: 02/11/20  9:11 AM   Specimen: Joint, Hip  Result Value Ref Range Status   Specimen Description   Final    HIP LEFT Performed at [facility], [place] [address], [place], [place] [address]    Special Requests   Final    NONE Performed at [facility], [place] [address] [place], [place] [address]    Gram Stain   Final    RARE WBC PRESENT, PREDOMINANTLY PMN NO ORGANISMS SEEN Gram Stain Report Called to,Read Back By and Verified With:  [id number]. RN @1016  ON 5.7.2021 BY [facility] Performed at [facility], [place] [address] [place], [place] [address]    Culture   Final    FEW STAPHYLOCOCCUS AUREUS CRITICAL RESULT CALLED TO, READ BACK BY AND VERIFIED WITH: J. MILLER RN, AT [phone] 02/13/20 BY D. Victoriano Lain REGARDING CULTURE GROWTH Performed at [facility], [place] [address], [place], [place] [address]    Report Status 02/14/2020 FINAL  Final   Organism ID, Bacteria STAPHYLOCOCCUS AUREUS  Final      Susceptibility   Staphylococcus aureus - MIC*    CIPROFLOXACIN <=0.5 SENSITIVE Sensitive     ERYTHROMYCIN <=0.25 SENSITIVE Sensitive     GENTAMICIN <=0.5 SENSITIVE Sensitive     OXACILLIN 0.5 SENSITIVE Sensitive  TETRACYCLINE <=1 SENSITIVE Sensitive     VANCOMYCIN <=0.5 SENSITIVE Sensitive     TRIMETH/SULFA <=10 SENSITIVE Sensitive     CLINDAMYCIN <=0.25 SENSITIVE Sensitive     RIFAMPIN <=0.5 SENSITIVE Sensitive     Inducible Clindamycin NEGATIVE Sensitive     * FEW STAPHYLOCOCCUS AUREUS  Body fluid culture     Status: None   Collection Time: 02/11/20  9:15 AM   Specimen: Joint, Hip  Result Value Ref Range Status   Specimen Description   Final    HIP LEFT 2 FLUID Performed at [place] [address], [place], [place] [address]    Special Requests   Final    NONE Performed at [facility], [place] [address], [place], [place] [address]    Gram Stain   Final    ABUNDANT WBC PRESENT, PREDOMINANTLY PMN NO ORGANISMS SEEN Gram Stain Report Called to,Read Back By and Verified With: [id number]. RN @1018  ON 5.7.2021 BY [facility] Performed at [facility], [place] [address], [place], [place] [address]    Culture   Final    FEW STAPHYLOCOCCUS AUREUS SUSCEPTIBILITIES TO FOLLOW CRITICAL RESULT CALLED TO, READ BACK BY AND VERIFIED WITH: J. MILLER RN, AT [phone] 02/13/20 BY D. Victoriano Lain REGARDING CULTURE GROWTH Performed at [facility], [place] [address],  [place], [place] [address]    Report Status 02/14/2020 FINAL  Final   Organism ID, Bacteria STAPHYLOCOCCUS AUREUS  Final      Susceptibility   Staphylococcus aureus - MIC*    CIPROFLOXACIN <=0.5 SENSITIVE Sensitive     ERYTHROMYCIN <=0.25 SENSITIVE Sensitive     GENTAMICIN <=0.5 SENSITIVE Sensitive     OXACILLIN 0.5 SENSITIVE Sensitive     TETRACYCLINE <=1 SENSITIVE Sensitive     VANCOMYCIN <=0.5 SENSITIVE Sensitive     TRIMETH/SULFA <=10 SENSITIVE Sensitive     CLINDAMYCIN <=0.25 SENSITIVE Sensitive     RIFAMPIN <=0.5 SENSITIVE Sensitive     Inducible Clindamycin NEGATIVE Sensitive     * FEW STAPHYLOCOCCUS AUREUS  Anaerobic culture     Status: None (Preliminary result)   Collection Time: 02/11/20  9:15 AM   Specimen: Joint, Hip  Result Value Ref Range Status   Specimen Description   Final    HIP LEFT 2 FLUID Performed at [place] [address] [place], [place] [address]    Special Requests   Final    NONE Performed at [facility], [place] [address] [place], [place] [address]    Culture   Final    NO ANAEROBES ISOLATED; CULTURE IN PROGRESS FOR 5 DAYS   Report Status PENDING  Incomplete    Michel Bickers, MD [facility] [place] [facility] [phone] pager   [phone] cell 02/15/2020, 10:23 AM

## 2020-02-16 ENCOUNTER — Encounter: Payer: Self-pay | Admitting: *Deleted

## 2020-02-16 LAB — ANAEROBIC CULTURE

## 2020-02-17 ENCOUNTER — Telehealth: Payer: Self-pay | Admitting: Orthopaedic Surgery

## 2020-02-17 ENCOUNTER — Telehealth: Payer: Self-pay

## 2020-02-17 DIAGNOSIS — M169 Osteoarthritis of hip, unspecified: Secondary | ICD-10-CM | POA: Diagnosis not present

## 2020-02-17 NOTE — Telephone Encounter (Signed)
Received call from [place] w/ [facility] to request VO for [facility] nursing 4wkx1, 1wkx2. Patient recently d/c from hospital on 5/11. He has pick line, getting lab work and incision on L hip.  Okay for order?

## 2020-02-17 NOTE — Telephone Encounter (Signed)
Actually told him that that is normal for having an I&D of the hip and placing antibiotic beads down deep in the hip and in the wound itself.  There is beads dissolved in leads to more drainage.

## 2020-02-17 NOTE — Telephone Encounter (Signed)
Beth aware of the below message

## 2020-02-17 NOTE — Telephone Encounter (Signed)
Beth with [facility] called. She says patient is having a lot of bloody drainage. More than usual. Wanted Dr. Ninfa Linden to know and someone to call her. [phone]

## 2020-02-17 NOTE — Telephone Encounter (Signed)
Beth at [facility] aware, she will fax something over for Dr Anitra Lauth to sign.

## 2020-02-17 NOTE — Telephone Encounter (Signed)
Yes okay 

## 2020-02-21 ENCOUNTER — Encounter: Payer: Self-pay | Admitting: Internal Medicine

## 2020-02-21 DIAGNOSIS — E669 Obesity, unspecified: Secondary | ICD-10-CM | POA: Diagnosis not present

## 2020-02-21 DIAGNOSIS — M5136 Other intervertebral disc degeneration, lumbar region: Secondary | ICD-10-CM | POA: Diagnosis not present

## 2020-02-21 DIAGNOSIS — Z9682 Presence of neurostimulator: Secondary | ICD-10-CM | POA: Diagnosis not present

## 2020-02-21 DIAGNOSIS — M1611 Unilateral primary osteoarthritis, right hip: Secondary | ICD-10-CM | POA: Diagnosis not present

## 2020-02-21 DIAGNOSIS — T8452XA Infection and inflammatory reaction due to internal left hip prosthesis, initial encounter: Secondary | ICD-10-CM | POA: Diagnosis not present

## 2020-02-21 DIAGNOSIS — G894 Chronic pain syndrome: Secondary | ICD-10-CM | POA: Diagnosis not present

## 2020-02-21 DIAGNOSIS — G629 Polyneuropathy, unspecified: Secondary | ICD-10-CM | POA: Diagnosis not present

## 2020-02-21 DIAGNOSIS — Z5181 Encounter for therapeutic drug level monitoring: Secondary | ICD-10-CM | POA: Diagnosis not present

## 2020-02-21 DIAGNOSIS — Z86010 Personal history of colonic polyps: Secondary | ICD-10-CM | POA: Diagnosis not present

## 2020-02-21 DIAGNOSIS — Z79891 Long term (current) use of opiate analgesic: Secondary | ICD-10-CM | POA: Diagnosis not present

## 2020-02-21 DIAGNOSIS — M170 Bilateral primary osteoarthritis of knee: Secondary | ICD-10-CM | POA: Diagnosis not present

## 2020-02-21 DIAGNOSIS — Z8546 Personal history of malignant neoplasm of prostate: Secondary | ICD-10-CM | POA: Diagnosis not present

## 2020-02-21 DIAGNOSIS — M48062 Spinal stenosis, lumbar region with neurogenic claudication: Secondary | ICD-10-CM | POA: Diagnosis not present

## 2020-02-21 DIAGNOSIS — M961 Postlaminectomy syndrome, not elsewhere classified: Secondary | ICD-10-CM | POA: Diagnosis not present

## 2020-02-21 DIAGNOSIS — Z452 Encounter for adjustment and management of vascular access device: Secondary | ICD-10-CM | POA: Diagnosis not present

## 2020-02-21 DIAGNOSIS — C61 Malignant neoplasm of prostate: Secondary | ICD-10-CM | POA: Diagnosis not present

## 2020-02-21 DIAGNOSIS — Z86718 Personal history of other venous thrombosis and embolism: Secondary | ICD-10-CM | POA: Diagnosis not present

## 2020-02-21 DIAGNOSIS — E7800 Pure hypercholesterolemia, unspecified: Secondary | ICD-10-CM | POA: Diagnosis not present

## 2020-02-21 DIAGNOSIS — I872 Venous insufficiency (chronic) (peripheral): Secondary | ICD-10-CM | POA: Diagnosis not present

## 2020-02-21 DIAGNOSIS — N182 Chronic kidney disease, stage 2 (mild): Secondary | ICD-10-CM | POA: Diagnosis not present

## 2020-02-21 DIAGNOSIS — Z7982 Long term (current) use of aspirin: Secondary | ICD-10-CM | POA: Diagnosis not present

## 2020-02-21 DIAGNOSIS — Z6830 Body mass index (BMI) 30.0-30.9, adult: Secondary | ICD-10-CM | POA: Diagnosis not present

## 2020-02-21 DIAGNOSIS — Z792 Long term (current) use of antibiotics: Secondary | ICD-10-CM | POA: Diagnosis not present

## 2020-02-21 DIAGNOSIS — M21372 Foot drop, left foot: Secondary | ICD-10-CM | POA: Diagnosis not present

## 2020-02-21 DIAGNOSIS — T8489XA Other specified complication of internal orthopedic prosthetic devices, implants and grafts, initial encounter: Secondary | ICD-10-CM | POA: Diagnosis not present

## 2020-02-21 DIAGNOSIS — N3941 Urge incontinence: Secondary | ICD-10-CM | POA: Diagnosis not present

## 2020-02-21 DIAGNOSIS — K5790 Diverticulosis of intestine, part unspecified, without perforation or abscess without bleeding: Secondary | ICD-10-CM | POA: Diagnosis not present

## 2020-02-21 LAB — PSA: PSA: 0.12

## 2020-02-22 DIAGNOSIS — T8459XD Infection and inflammatory reaction due to other internal joint prosthesis, subsequent encounter: Secondary | ICD-10-CM | POA: Diagnosis not present

## 2020-02-22 DIAGNOSIS — A4901 Methicillin susceptible Staphylococcus aureus infection, unspecified site: Secondary | ICD-10-CM | POA: Diagnosis not present

## 2020-02-28 DIAGNOSIS — Z5111 Encounter for antineoplastic chemotherapy: Secondary | ICD-10-CM | POA: Diagnosis not present

## 2020-02-28 DIAGNOSIS — C775 Secondary and unspecified malignant neoplasm of intrapelvic lymph nodes: Secondary | ICD-10-CM | POA: Diagnosis not present

## 2020-02-28 DIAGNOSIS — C61 Malignant neoplasm of prostate: Secondary | ICD-10-CM | POA: Diagnosis not present

## 2020-02-29 ENCOUNTER — Ambulatory Visit (INDEPENDENT_AMBULATORY_CARE_PROVIDER_SITE_OTHER): Payer: PPO | Admitting: Orthopaedic Surgery

## 2020-02-29 ENCOUNTER — Other Ambulatory Visit: Payer: Self-pay

## 2020-02-29 ENCOUNTER — Encounter: Payer: Self-pay | Admitting: Orthopaedic Surgery

## 2020-02-29 DIAGNOSIS — T8452XD Infection and inflammatory reaction due to internal left hip prosthesis, subsequent encounter: Secondary | ICD-10-CM

## 2020-02-29 DIAGNOSIS — T8459XD Infection and inflammatory reaction due to other internal joint prosthesis, subsequent encounter: Secondary | ICD-10-CM | POA: Diagnosis not present

## 2020-02-29 DIAGNOSIS — A4901 Methicillin susceptible Staphylococcus aureus infection, unspecified site: Secondary | ICD-10-CM | POA: Diagnosis not present

## 2020-02-29 NOTE — Progress Notes (Signed)
HPI: Mr. Vanwieren returns today now 2 weeks status post irrigation debridement left hip with antibiotic bead placement.  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  Overall doing well states he has had no drainage for the last week.  He is due to see infectious disease on June 4.  Currently he is receiving IV cefazolin and rifampin.  Physical exam: Left hip surgical incisions well approximated with interrupted nylon no signs of infection no signs of drainage.  He ambulates with a cane.  Is able to get on and off the exam table easily.  Impression: Left hip infection 2 weeks status post irrigation debridement with antibiotic bead placement  Plan: We will see him back after he returns from the beach in mid June.  Sutures removed today Steri-Strips applied.  Questions encouraged and answered by Dr. Ninfa Linden and myself.

## 2020-03-07 ENCOUNTER — Encounter: Payer: Self-pay | Admitting: Internal Medicine

## 2020-03-07 DIAGNOSIS — M961 Postlaminectomy syndrome, not elsewhere classified: Secondary | ICD-10-CM | POA: Diagnosis not present

## 2020-03-07 DIAGNOSIS — N3941 Urge incontinence: Secondary | ICD-10-CM | POA: Diagnosis not present

## 2020-03-07 DIAGNOSIS — T8459XD Infection and inflammatory reaction due to other internal joint prosthesis, subsequent encounter: Secondary | ICD-10-CM | POA: Diagnosis not present

## 2020-03-07 DIAGNOSIS — Z792 Long term (current) use of antibiotics: Secondary | ICD-10-CM | POA: Diagnosis not present

## 2020-03-07 DIAGNOSIS — K5790 Diverticulosis of intestine, part unspecified, without perforation or abscess without bleeding: Secondary | ICD-10-CM | POA: Diagnosis not present

## 2020-03-07 DIAGNOSIS — G629 Polyneuropathy, unspecified: Secondary | ICD-10-CM | POA: Diagnosis not present

## 2020-03-07 DIAGNOSIS — Z86010 Personal history of colonic polyps: Secondary | ICD-10-CM | POA: Diagnosis not present

## 2020-03-07 DIAGNOSIS — Z79891 Long term (current) use of opiate analgesic: Secondary | ICD-10-CM | POA: Diagnosis not present

## 2020-03-07 DIAGNOSIS — M48062 Spinal stenosis, lumbar region with neurogenic claudication: Secondary | ICD-10-CM | POA: Diagnosis not present

## 2020-03-07 DIAGNOSIS — M1611 Unilateral primary osteoarthritis, right hip: Secondary | ICD-10-CM | POA: Diagnosis not present

## 2020-03-07 DIAGNOSIS — Z8546 Personal history of malignant neoplasm of prostate: Secondary | ICD-10-CM | POA: Diagnosis not present

## 2020-03-07 DIAGNOSIS — Z9682 Presence of neurostimulator: Secondary | ICD-10-CM | POA: Diagnosis not present

## 2020-03-07 DIAGNOSIS — M170 Bilateral primary osteoarthritis of knee: Secondary | ICD-10-CM | POA: Diagnosis not present

## 2020-03-07 DIAGNOSIS — Z452 Encounter for adjustment and management of vascular access device: Secondary | ICD-10-CM | POA: Diagnosis not present

## 2020-03-07 DIAGNOSIS — M21372 Foot drop, left foot: Secondary | ICD-10-CM | POA: Diagnosis not present

## 2020-03-07 DIAGNOSIS — E7800 Pure hypercholesterolemia, unspecified: Secondary | ICD-10-CM | POA: Diagnosis not present

## 2020-03-07 DIAGNOSIS — E669 Obesity, unspecified: Secondary | ICD-10-CM | POA: Diagnosis not present

## 2020-03-07 DIAGNOSIS — Z86718 Personal history of other venous thrombosis and embolism: Secondary | ICD-10-CM | POA: Diagnosis not present

## 2020-03-07 DIAGNOSIS — T8452XA Infection and inflammatory reaction due to internal left hip prosthesis, initial encounter: Secondary | ICD-10-CM | POA: Diagnosis not present

## 2020-03-07 DIAGNOSIS — T8489XA Other specified complication of internal orthopedic prosthetic devices, implants and grafts, initial encounter: Secondary | ICD-10-CM | POA: Diagnosis not present

## 2020-03-07 DIAGNOSIS — M5136 Other intervertebral disc degeneration, lumbar region: Secondary | ICD-10-CM | POA: Diagnosis not present

## 2020-03-07 DIAGNOSIS — I872 Venous insufficiency (chronic) (peripheral): Secondary | ICD-10-CM | POA: Diagnosis not present

## 2020-03-07 DIAGNOSIS — Z7982 Long term (current) use of aspirin: Secondary | ICD-10-CM | POA: Diagnosis not present

## 2020-03-07 DIAGNOSIS — Z6830 Body mass index (BMI) 30.0-30.9, adult: Secondary | ICD-10-CM | POA: Diagnosis not present

## 2020-03-07 DIAGNOSIS — N182 Chronic kidney disease, stage 2 (mild): Secondary | ICD-10-CM | POA: Diagnosis not present

## 2020-03-07 DIAGNOSIS — A4901 Methicillin susceptible Staphylococcus aureus infection, unspecified site: Secondary | ICD-10-CM | POA: Diagnosis not present

## 2020-03-07 DIAGNOSIS — G894 Chronic pain syndrome: Secondary | ICD-10-CM | POA: Diagnosis not present

## 2020-03-09 ENCOUNTER — Telehealth: Payer: Self-pay | Admitting: *Deleted

## 2020-03-09 ENCOUNTER — Other Ambulatory Visit: Payer: Self-pay

## 2020-03-09 ENCOUNTER — Encounter: Payer: Self-pay | Admitting: Internal Medicine

## 2020-03-09 ENCOUNTER — Telehealth: Payer: Self-pay | Admitting: Orthopaedic Surgery

## 2020-03-09 ENCOUNTER — Ambulatory Visit: Payer: PPO | Admitting: Internal Medicine

## 2020-03-09 DIAGNOSIS — T8452XD Infection and inflammatory reaction due to internal left hip prosthesis, subsequent encounter: Secondary | ICD-10-CM

## 2020-03-09 MED ORDER — CEFAZOLIN IV (FOR PTA / DISCHARGE USE ONLY): 2.0000 g | Freq: Three times a day (TID) | INTRAVENOUS | Status: AC

## 2020-03-09 MED ORDER — RIFAMPIN 300 MG PO CAPS: 600.0000 mg | ORAL_CAPSULE | Freq: Every day | ORAL | 1 refills | Status: AC

## 2020-03-09 MED ORDER — CEPHALEXIN 500 MG PO CAPS: 500.0000 mg | ORAL_CAPSULE | Freq: Three times a day (TID) | ORAL | 11 refills | Status: DC

## 2020-03-09 NOTE — Telephone Encounter (Signed)
[facility]--[phone] ([place])--verbal order PICC line removal on the last dose antibiotic 03/17/2020 by DrMarland Kitchen Michel Bickers.

## 2020-03-09 NOTE — Telephone Encounter (Signed)
Eli with [employer] called stating they faxed [place] some paperwork on behalf of the pt getting medical supplies and would like this signed and sent back today.   Fax# [phone]

## 2020-03-09 NOTE — Telephone Encounter (Signed)
You seen this by chance?

## 2020-03-09 NOTE — Progress Notes (Signed)
[facility] for Infectious Disease  Patient Active Problem List   Diagnosis Date Noted  . 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 Prosthetic hip infection, subsequent encounter 02/11/2020  . Loosening of prosthetic hip ([place]) 09/08/2017  . 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 Pain in left hip 09/01/2017  . History of revision of total replacement of left hip joint 09/01/2017  . Encounter for surveillance of recalled total hip arthroplasty hardware ([place]) 06/07/2016  . Status post revision of total hip replacement 06/07/2016  . 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 Red blood cell abnormality 02/11/2016  . Elevated blood pressure reading without diagnosis of hypertension 07/01/2013  . Peripheral neuropathy 06/02/2013  . Health maintenance examination 11/26/2011  . Prostate cancer ([place]) 05/21/2010  . Obesity, Class I, BMI 30-34.9 05/18/2010  . Venous (peripheral) insufficiency 05/18/2010  . Osteoarthrosis involving lower leg 05/18/2010    Patient's Medications  New Prescriptions   CEPHALEXIN (KEFLEX) 500 MG CAPSULE    Take 1 capsule (500 mg total) by mouth 3 (three) times daily.  Previous Medications   ACETAMINOPHEN (TYLENOL) 500 MG TABLET    Take 1,000 mg by mouth 2 (two) times daily as needed for moderate pain or headache.   ASPIRIN EC 81 MG TABLET    Take 81 mg by mouth daily.   CALCIUM CARB-CHOLECALCIFEROL (CALCIUM + D3 PO)    Take 1 tablet by mouth daily.   ERLEADA 60 MG TABLET    Take 360 mg by mouth daily.    HYDROCODONE-ACETAMINOPHEN (NORCO) 7.5-325 MG TABLET    Take 1-2 tablets by mouth every 6 (six) hours as needed for moderate pain.   TROSPIUM CHLORIDE 60 MG CP24    Take 60 mg by mouth daily.   Modified Medications   Modified Medication Previous Medication   CEFAZOLIN (ANCEF) IVPB ceFAZolin (ANCEF) IVPB      Inject 2 g into the vein every 8 (eight) hours for 8 days.  Indication: prosthetic joint infection  First Dose: Yes Last Day of Therapy:  03/24/2020 Labs - Once weekly:  CBC/D, BMP, ESR, and CRP Method of administration: IV Push Method of administration may be changed at the discretion of home infusion pharmacist based upon assessment of the patient and/or caregiver's ability to self-administer the medication ordered.    Inject 2 g into the vein every 8 (eight) hours. Indication: prosthetic joint infection  First Dose: Yes Last Day of Therapy:  03/24/2020 Labs - Once weekly:  CBC/D, BMP, ESR, and CRP Method of administration: IV Push Method of administration may be changed at the discretion of home infusion pharmacist based upon assessment of the patient and/or caregiver's ability to self-administer the medication ordered.   RIFAMPIN (RIFADIN) 300 MG CAPSULE rifampin (RIFADIN) 300 MG capsule      Take 2 capsules (600 mg total) by mouth daily for 8 days.    Take 2 capsules (600 mg total) by mouth daily.  Discontinued Medications   No medications on file    Subjective: [name] is in for his hospital follow-up visit.  He underwent left total hip arthroplasty in 2008. He did well until he developed failure of the acetabular component requiring revision in September 2017.  Operative cultures grew MSSA.  He recalls being on a 4-week course of IV antibiotic following surgery.  He did well until late 2018.  He required repeat revision in January of that  year and cultures grew MSSA again.  He was treated with a 6-week course of IV antibiotics and did well until recently starting to have more pain.  MRI showed a fluid collection in the left hip.  He was admitted and underwent incision and drainage and antibiotic bead placement on 02/11/2020.  Cultures grew MSSA again.    A PICC was placed and he was discharged on IV cefazolin and oral rifampin with a plan to complete 6 weeks on 03/24/2020.  He is feeling much improved.  He is not having any hip pain or groin requiring any  pain medication.  [place].  He has not had any problems tolerating his PICC or antibiotics.  Because of the potential adverse drug drug interaction with rifampin the Erleada he takes for his prostate cancer has been on hold.  Review of Systems: Review of Systems  Constitutional: Negative for chills, diaphoresis and fever.  Gastrointestinal: Negative for abdominal pain, diarrhea, nausea and vomiting.  Musculoskeletal: Negative for joint pain.    Past Medical History:  Diagnosis Date  . Anterior dislocation of right shoulder 09/2013   Reduced in ED under sedation.  Dislocated posteriorly 2015, ? partial RC tear, has f/u planned with Dr. Marlou Sa--? surgery? possible plan for 2016.  [name] Blood donor, whole blood    Has donated approx 30 gallons in his lifetime  . Cancer [facility]) 2012   Prostate cancer   . Chronic left hip pain    Severe DJD.  THA 2008.  Left acetabular revision in 2017 AND 2019.  [name] Chronic pain syndrome    Candidate for spinal cord stimulator (03/2017)  . Chronic renal insufficiency, stage II (mild)   . Chronic venous insufficiency    +compression hose  . DDD (degenerative disc disease), lumbar 2017   Dr. Noe Gens ortho.  He is now wearing a new leg brace.  Has had ESI and will get another.  . Diverticulosis    +redundant colon  . DVT (deep venous thrombosis) ([place]) 06/14/2016   provoked, bilateral LL's; in post-op setting s/p hip surgery.  Xarelto x 37moat full dosing, then 6 more months at 1/2 dosing, then stopped xarelto.    . Foot drop, left    s/p hip fracture 1969  . Hx of adenomatous colonic polyps    polypectomy 2007; 2012; 2015; 05/2019; recall 530yr . Hypercholesterolemia 02/2017   Recommended statin 02/10/17 but pt declined, wanted to do TLC trial first.  . Osteoarthritis    knees and hips primarily  . Post laminectomy syndrome    improved with spinal cord stim-->stim to be removed summer/fall 2019.  . 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 Stable PSA urol f/u thru 2018. 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  . Prosthetic hip infection ([place]01/2019; 02/2020   MSSA; admitted for I&D 02/11/20->plan for longterm IV abx after  . Urge incontinence    vesicare helpful    Social History   Tobacco Use  . Smoking status: Never Smoker  . Smokeless tobacco: Never Used  Substance Use Topics  . Alcohol use: No  . Drug use: No    Family History  Problem Relation Age of Onset  . Lung cancer Mother   . Heart disease Father   . Colon cancer Neg Hx   . Esophageal cancer  Neg Hx   . Rectal cancer Neg Hx   . Stomach cancer Neg Hx   . Colon polyps Neg Hx     Allergies  Allergen Reactions  . Adhesive [Tape] Rash    Paper tape is ok    Objective: Vitals:   03/09/20 1043  BP: (!) 157/72  Pulse: 65  Temp: 98 F (36.7 C)  Weight: 255 lb (115.7 kg)  Height: 6' 1.5" (1.867 m)   Body mass index is 33.19 kg/m.  Physical Exam Constitutional:      Comments: He is in good spirits.  Musculoskeletal:     Comments: His anterior left hip incision is healing nicely.  He walks with the aid of a walking stick.  Skin:    Findings: No rash.     Comments: Right arm PICC site looks good.  Psychiatric:        Mood and Affect: Mood normal.       Problem List Items Addressed This Visit      High   Prosthetic joint infection of left hip ([place])    He is improving on therapy for relapsed MSSA left prosthetic hip infection.  I would prefer that he have his PICC line removed before he goes to the beach on 03/17/2020.  I will change him to long-term oral cephalexin and stop his rifampin.  He can restart Erleada once he is off rifampin.  He will follow-up here in 6 weeks.      Relevant Medications   rifampin (RIFADIN) 300 MG capsule   ceFAZolin (ANCEF) IVPB   cephALEXin  (KEFLEX) 500 MG capsule       Michel Bickers, MD [facility] for Infectious [place] [facility] [phone] pager   [phone] cell 03/09/2020, 11:07 AM

## 2020-03-09 NOTE — Assessment & Plan Note (Signed)
He is improving on therapy for relapsed MSSA left prosthetic hip infection.  I would prefer that he have his PICC line removed before he goes to the beach on 03/17/2020.  I will change him to long-term oral cephalexin and stop his rifampin.  He can restart Erleada once he is off rifampin.  He will follow-up here in 6 weeks.

## 2020-03-13 ENCOUNTER — Encounter: Payer: Self-pay | Admitting: Internal Medicine

## 2020-03-13 DIAGNOSIS — Z5181 Encounter for therapeutic drug level monitoring: Secondary | ICD-10-CM | POA: Diagnosis not present

## 2020-03-13 DIAGNOSIS — Z792 Long term (current) use of antibiotics: Secondary | ICD-10-CM | POA: Diagnosis not present

## 2020-03-14 ENCOUNTER — Telehealth: Payer: Self-pay | Admitting: *Deleted

## 2020-03-14 DIAGNOSIS — A4901 Methicillin susceptible Staphylococcus aureus infection, unspecified site: Secondary | ICD-10-CM | POA: Diagnosis not present

## 2020-03-14 DIAGNOSIS — T8459XD Infection and inflammatory reaction due to other internal joint prosthesis, subsequent encounter: Secondary | ICD-10-CM | POA: Diagnosis not present

## 2020-03-14 NOTE — Telephone Encounter (Signed)
Beth, RN from [facility], called to clarify pull PICC order. Patient's last dose 6/11 would be 10:00 pm.  They are asking for permission to pull after morning or afternoon dose to better accommodate home health nurse.  OK per Dr Megan Salon. Order repeated and verified. Landis Gandy, RN

## 2020-03-16 DIAGNOSIS — T8189XA Other complications of procedures, not elsewhere classified, initial encounter: Secondary | ICD-10-CM | POA: Diagnosis not present

## 2020-03-23 ENCOUNTER — Encounter: Payer: Self-pay | Admitting: Family Medicine

## 2020-03-28 ENCOUNTER — Ambulatory Visit (INDEPENDENT_AMBULATORY_CARE_PROVIDER_SITE_OTHER): Payer: PPO | Admitting: Orthopaedic Surgery

## 2020-03-28 ENCOUNTER — Encounter: Payer: Self-pay | Admitting: Orthopaedic Surgery

## 2020-03-28 ENCOUNTER — Other Ambulatory Visit: Payer: Self-pay

## 2020-03-28 DIAGNOSIS — T8459XD Infection and inflammatory reaction due to other internal joint prosthesis, subsequent encounter: Secondary | ICD-10-CM

## 2020-03-28 DIAGNOSIS — Z96649 Presence of unspecified artificial hip joint: Secondary | ICD-10-CM

## 2020-03-28 NOTE — Progress Notes (Signed)
HPI: Mr. Tuch returns today now approximately 6-1/2 weeks status post left hip irrigation debridement with placement of antibiotic beads.  Again he had methicillin sensitive staph aureus that was recurrent.  He has seen infectious disease since being in the office last.  His PICC line was removed and he was placed on oral cephalexin.  He is having no hip pain.  He has had no fevers or chills.  Physical exam: Left hip surgical incisions well-healed no signs of infection no drainage.  Hip incision nontender.  Good range of motion left hip without pain.  Left calf supple nontender.  Has a foot drop on the left side that is chronic.  Impression: Status post left hip irrigation debridement with placement antibiotic beads 02/11/2020 Recurrent methicillin sensitive staph aureus  Plan: We will follow up with [place] in 6 months sooner if there is any questions concerns.  Continue antibiotics per infectious disease.  Questions were encouraged and answered by Dr. Ninfa Linden and myself.

## 2020-04-24 ENCOUNTER — Other Ambulatory Visit: Payer: Self-pay

## 2020-04-24 ENCOUNTER — Encounter: Payer: Self-pay | Admitting: Internal Medicine

## 2020-04-24 ENCOUNTER — Ambulatory Visit: Payer: PPO | Admitting: Internal Medicine

## 2020-04-24 DIAGNOSIS — T8452XD Infection and inflammatory reaction due to internal left hip prosthesis, subsequent encounter: Secondary | ICD-10-CM | POA: Diagnosis not present

## 2020-04-24 NOTE — Progress Notes (Addendum)
[facility] for Infectious Disease  Patient Active Problem List   Diagnosis Date Noted  . 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 Prosthetic hip infection, subsequent encounter 02/11/2020  . Loosening of prosthetic hip ([place]) 09/08/2017  . 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 Pain in left hip 09/01/2017  . History of revision of total replacement of left hip joint 09/01/2017  . Encounter for surveillance of recalled total hip arthroplasty hardware ([place]) 06/07/2016  . Status post revision of total hip replacement 06/07/2016  . 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 Red blood cell abnormality 02/11/2016  . Elevated blood pressure reading without diagnosis of hypertension 07/01/2013  . Peripheral neuropathy 06/02/2013  . Health maintenance examination 11/26/2011  . Prostate cancer ([place]) 05/21/2010  . Obesity, Class I, BMI 30-34.9 05/18/2010  . Venous (peripheral) insufficiency 05/18/2010  . Osteoarthrosis involving lower leg 05/18/2010    Patient's Medications  New Prescriptions   No medications on file  Previous Medications   ACETAMINOPHEN (TYLENOL) 500 MG TABLET    Take 1,000 mg by mouth 2 (two) times daily as needed for moderate pain or headache.   ASPIRIN EC 81 MG TABLET    Take 81 mg by mouth daily.   CALCIUM CARB-CHOLECALCIFEROL (CALCIUM + D3 PO)    Take 1 tablet by mouth daily.   CEPHALEXIN (KEFLEX) 500 MG CAPSULE    Take 1 capsule (500 mg total) by mouth 3 (three) times daily.   ERLEADA 60 MG TABLET    Take 240 mg by mouth daily.    HYDROCODONE-ACETAMINOPHEN (NORCO) 7.5-325 MG TABLET    Take 1-2 tablets by mouth every 6 (six) hours as needed for moderate pain.   TROSPIUM CHLORIDE 60 MG CP24    Take 60 mg by mouth daily.   Modified Medications   No medications on file  Discontinued Medications   No medications on file    Subjective: [name] is in for his routine follow-up  visit.  He underwent left total hip arthroplasty in 2008. He did well until he developed failure of the acetabular component requiring revision in September 2017. Operative cultures grew MSSA. He recalls being on a 4-week course of IV antibiotic following surgery. He did well until late 2018. He required repeat revision in January of that year and cultures grew MSSA again. He was treated with a 6-week course of IV antibiotics and did well until recently starting to have more pain. MRI showed a fluid collection in the left hip. He was admitted and underwent incision and drainage and antibiotic bead placement on 02/11/2020. Cultures grew MSSA again.   A PICC was placed and he was discharged on IV cefazolin and oral rifampin with a plan to complete 6 weeks on 03/17/2020 before converting to oral cephalexin alone.  He is feeling much improved.  He is not having any hip pain or groin requiring any pain medication.    Review of Systems: Review of Systems  Constitutional: Negative for chills, diaphoresis and fever.  Gastrointestinal: Negative for diarrhea, nausea and vomiting.  Musculoskeletal: Positive for joint pain.       He is having minimal hip pain but is bothered by degenerative arthritis in his right knee.    Past Medical History:  Diagnosis Date  . Anterior dislocation of right shoulder 09/2013   Reduced in ED under sedation.  Dislocated posteriorly 2015, ? partial RC tear, has  f/u planned with Dr. Marlou Sa--? surgery? possible plan for 2016.  [name] Blood donor, whole blood    Has donated approx 30 gallons in his lifetime  . Cancer [facility]) 2012   Prostate cancer   . Chronic left hip pain    Severe DJD.  THA 2008.  Left acetabular revision in 2017 AND 2019.  [name] Chronic pain syndrome    Candidate for spinal cord stimulator (03/2017)  . Chronic renal insufficiency, stage II (mild)   . Chronic venous insufficiency    +compression hose  . DDD (degenerative disc disease), lumbar 2017   Dr.  Noe Gens ortho.  He is now wearing a new leg brace.  Has had ESI and will get another.  . Diverticulosis    +redundant colon  . DVT (deep venous thrombosis) ([place]) 06/14/2016   provoked, bilateral LL's; in post-op setting s/p hip surgery.  Xarelto x 58moat full dosing, then 6 more months at 1/2 dosing, then stopped xarelto.    . Foot drop, left    s/p hip fracture 1969  . Hx of adenomatous colonic polyps    polypectomy 2007; 2012; 2015; 05/2019; recall 545yr . Hypercholesterolemia 02/2017   Recommended statin 02/10/17 but pt declined, wanted to do TLC trial first. Rec'd statin 04/2020  . Osteoarthritis    knees and hips primarily  . Post laminectomy syndrome    improved with spinal cord stim-->stim to be removed summer/fall 2019.  . 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 Recurrence 2020->started eligard and apalutamide. PSA 0.16 Feb 2020 urol f/u. .0[name]4 Aug 2021 urol.  . Pro[place] infection ([place]; 02/2020   MSSA; admitted for I&D 02/11/20->plan for longterm IV abx after  . Urge incontinence    vesicare helpful    Social History   Tobacco Use  . Smoking status: Never Smoker  . Smokeless tobacco: Never Used  Vaping Use  . Vaping Use: Never used  Substance Use Topics  . Alcohol use: No  . Drug use: No    Family History  Problem Relation Age of Onset  . Lung cancer Mother   . Heart disease Father   . Colon cancer Neg Hx   . Esophageal cancer Neg Hx   . Rectal cancer Neg Hx   . Stomach cancer Neg Hx   . Colon polyps Neg Hx     Allergies  Allergen Reactions  . Adhesive [Tape] Rash    Paper tape is ok    Objective: Vitals:   04/24/20 0844  BP: (!) 162/81  Pulse: 61  Weight: 252 lb (114.3 kg)  Height: _0  (1.905 m)   Body mass index is 31.5 kg/m.  Physical Exam Cardiovascular:     Rate and Rhythm: Normal rate and regular rhythm.     Heart sounds: No murmur heard.    Pulmonary:     Effort: Pulmonary effort is normal.     Breath sounds: Normal breath sounds.  Musculoskeletal:     Comments: His hip incision has healed nicely.     Lab Results 03/07/2020 ESR normal at 22 CRP elevated at 14   Problem List Items Addressed This Visit      High   Prosthetic joint infection of left hip ([place]   He is recovering nicely and tolerating oral cephalexin well following recent surgery for recurrent MSSA prosthetic hip infection.  He will continue cephalexin and follow-up in 3 months.  Michel Bickers, MD [facility] for [place] [facility] [phone] pager   [phone] cell 07/26/2020, 4:27 PM

## 2020-04-27 ENCOUNTER — Ambulatory Visit (INDEPENDENT_AMBULATORY_CARE_PROVIDER_SITE_OTHER): Payer: PPO | Admitting: Family Medicine

## 2020-04-27 ENCOUNTER — Encounter: Payer: Self-pay | Admitting: Family Medicine

## 2020-04-27 ENCOUNTER — Other Ambulatory Visit: Payer: Self-pay

## 2020-04-27 VITALS — BP 130/80 | HR 64 | Temp 98.1°F | Resp 16 | Ht 73.5 in | Wt 255.0 lb

## 2020-04-27 DIAGNOSIS — Z0000 Encounter for general adult medical examination without abnormal findings: Secondary | ICD-10-CM | POA: Diagnosis not present

## 2020-04-27 DIAGNOSIS — E7800 Pure hypercholesterolemia, unspecified: Secondary | ICD-10-CM | POA: Diagnosis not present

## 2020-04-27 DIAGNOSIS — N182 Chronic kidney disease, stage 2 (mild): Secondary | ICD-10-CM | POA: Diagnosis not present

## 2020-04-27 LAB — COMPREHENSIVE METABOLIC PANEL
ALT: 8 U/L (ref 0–53)
AST: 13 U/L (ref 0–37)
Albumin: 3.9 g/dL (ref 3.5–5.2)
Alkaline Phosphatase: 85 U/L (ref 39–117)
BUN: 21 mg/dL (ref 6–23)
CO2: 28 mEq/L (ref 19–32)
Calcium: 10.5 mg/dL (ref 8.4–10.5)
Chloride: 104 mEq/L (ref 96–112)
Creatinine, Ser: 1.22 mg/dL (ref 0.40–1.50)
GFR: 57.87 mL/min — ABNORMAL LOW (ref >60.00)
Glucose, Bld: 99 mg/dL (ref 70–99)
Potassium: 4.7 mEq/L (ref 3.5–5.1)
Sodium: 137 mEq/L (ref 135–145)
Total Bilirubin: 0.5 mg/dL (ref 0.2–1.2)
Total Protein: 6.8 g/dL (ref 6.0–8.3)

## 2020-04-27 LAB — CBC WITH DIFFERENTIAL/PLATELET
Basophils Absolute: 0.1 10*3/uL (ref 0.0–0.1)
Basophils Relative: 1 % (ref 0.0–3.0)
Eosinophils Absolute: 0.3 10*3/uL (ref 0.0–0.7)
Eosinophils Relative: 5.1 % — ABNORMAL HIGH (ref 0.0–5.0)
HCT: 46.6 % (ref 39.0–52.0)
Hemoglobin: 15.3 g/dL (ref 13.0–17.0)
Lymphocytes Relative: 39 % (ref 12.0–46.0)
Lymphs Abs: 2 10*3/uL (ref 0.7–4.0)
MCHC: 32.8 g/dL (ref 30.0–36.0)
MCV: 90.2 fl (ref 78.0–100.0)
Monocytes Absolute: 0.5 10*3/uL (ref 0.1–1.0)
Monocytes Relative: 9.3 % (ref 3.0–12.0)
Neutro Abs: 2.4 10*3/uL (ref 1.4–7.7)
Neutrophils Relative %: 45.6 % (ref 43.0–77.0)
Platelets: 225 10*3/uL (ref 150.0–400.0)
RBC: 5.16 Mil/uL (ref 4.22–5.81)
RDW: 15.6 % — ABNORMAL HIGH (ref 11.5–15.5)
WBC: 5.2 10*3/uL (ref 4.0–10.5)

## 2020-04-27 LAB — LIPID PANEL
Cholesterol: 205 mg/dL — ABNORMAL HIGH (ref 0–200)
HDL: 41.4 mg/dL (ref >39.00)
LDL Cholesterol: 139 mg/dL — ABNORMAL HIGH (ref 0–99)
NonHDL: 163.56
Total CHOL/HDL Ratio: 5
Triglycerides: 124 mg/dL (ref 0.0–149.0)
VLDL: 24.8 mg/dL (ref 0.0–40.0)

## 2020-04-27 NOTE — Progress Notes (Signed)
Office Note 04/27/2020  CC:  Chief Complaint  Patient presents with   Annual Exam    pt is fasting     HPI:  [name] is a 74 y.o. White male who is here for annual health maintenance exam.  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   Long term oral cephalexin started and rifampin stopped, was given the OK to restart prost ca treatment med (erleada). Taking erleada w/out any problems.  Most recent PSA was 0.12.  Feeling well. Active but exercise signif limited by L hip. Diet: healthy. No hip pain.  Past Medical History:  Diagnosis Date   Anterior dislocation of right shoulder 09/2013   Reduced in ED under sedation.  Dislocated posteriorly 2015, ? partial RC tear, has f/u planned with Dr. Marlou Sa--? surgery? possible plan for 2016.   Blood donor, whole blood    Has donated approx 30 gallons in his lifetime   Cancer [facility]) 2012   Prostate cancer    Chronic left hip pain    Severe DJD.  THA 2008.  Left acetabular revision in 2017 AND 2019.   Chronic pain syndrome    Candidate for spinal cord stimulator (03/2017)   Chronic renal insufficiency, stage II (mild)    Chronic venous insufficiency    +compression hose   DDD (degenerative disc disease), lumbar 2017   Dr. Noe Gens ortho.  He is now wearing a new leg brace.  Has had ESI and will get another.   Diverticulosis    +redundant colon   DVT (deep venous thrombosis) ([place]) 06/14/2016   provoked, bilateral LL's; in post-op setting s/p hip surgery.  Xarelto x 32mo at full dosing, then 6 more months at 1/2 dosing, then stopped xarelto.     Foot drop, left    s/p hip fracture 1969   Hx of adenomatous colonic polyps    polypectomy 2007; 2012; 2015; 05/2019; recall 9yrs   Hypercholesterolemia 02/2017   Recommended statin 02/10/17 but pt declined, wanted to do TLC trial first.   Osteoarthritis    knees and hips primarily   Post laminectomy  syndrome    improved with spinal cord stim-->stim to be removed summer/fall 2019.   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 Stable PSA urol f/u thru 2018. 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; 02/2020   MSSA; admitted for I&D 02/11/20->plan for longterm IV abx after   Urge incontinence    vesicare helpful    Past Surgical History:  Procedure Laterality Date   ANTERIOR HIP REVISION Left 06/07/2016   Procedure: LEFT ANTERIOR HIP ACETABULAR REVISION;  Surgeon: Mcarthur Rossetti, MD;  Location: [facility];  Service: Orthopedics;  Laterality: Left;   ANTERIOR HIP REVISION Left 10/09/2017   Procedure: Left hip acetabular revision;  Surgeon: Mcarthur Rossetti, MD;  Location: [facility];  Service: Orthopedics;  Laterality: Left;   arthroscopy left shoulder Left 07   bilateral carpal tunnel release  2003   BILATERAL CARPAL TUNNEL RELEASE     COLONOSCOPY  2007;2012;2015;05/2019   04/12/2014 Tubular adenoma x 1; 05/2019 adenomatous polyp; recall 5 yrs.   FRACTURE SURGERY Left    INCISION AND DRAINAGE HIP Left 02/11/2020   Procedure: IRRIGATION AND DEBRIDEMENT LEFT HIP WITH ANTIBIOTIC SPACER PLACEMENT;  Surgeon: Ninfa Linden,  Lind Guest, MD;  Location: [facility];  Service: Orthopedics;  Laterality: Left;   JOINT REPLACEMENT Left    hip   LUMBAR LAMINECTOMY/DECOMPRESSION MICRODISCECTOMY Left 03/28/2016   Procedure: Left Lumbar three-four Laminoforaminotomy;  Surgeon: Erline Levine, MD;  Location: [place] [facility];  Service: Neurosurgery;  Laterality: Left;  left   POLYPECTOMY     RADIOACTIVE SEED IMPLANT  2012   ROTATOR CUFF REPAIR  2006   right   seed implant prostate  2012   SHOULDER ARTHROSCOPY Left    SPINAL CORD STIMULATOR IMPLANT  06/2017   Very helpful   TOTAL HIP ARTHROPLASTY  2008   left (cobalt chrome)    VASECTOMY      Family History  Problem Relation Age of Onset   Lung cancer Mother    Heart disease Father    Colon cancer Neg Hx    Esophageal cancer Neg Hx    Rectal cancer Neg Hx    Stomach cancer Neg Hx    Colon polyps Neg Hx     Social History   Socioeconomic History   Marital status: Married    Spouse name: Not on file   Number of children: Not on file   Years of education: Not on file   Highest education level: Not on file  Occupational History   Not on file  Tobacco Use   Smoking status: Never Smoker   Smokeless tobacco: Never Used  Vaping Use   Vaping Use: Never used  Substance and Sexual Activity   Alcohol use: No   Drug use: No   Sexual activity: Not on file  Other Topics Concern   Not on file  Social History Narrative   Married, 2 biologic children, 2 adopted, 5 GC.   Lives in [place].  Worked 35 yrs at [employer].   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   No formal exercise.  No T/A/Ds.   Enjoys woodworking.   Social Determinants of Health   Financial Resource Strain:    Difficulty of Paying Living Expenses:   Food Insecurity:    Worried About Charity fundraiser in the Last Year:    Arboriculturist in the Last Year:   Transportation Needs:    Film/video editor (Medical):    Lack of Transportation (Non-Medical):   Physical Activity:    Days of Exercise per Week:    Minutes of Exercise per Session:   Stress:    Feeling of Stress :   Social Connections:    Frequency of Communication with Friends and Family:    Frequency of Social Gatherings with Friends and Family:    Attends Religious Services:    Active Member of Clubs or Organizations:    Attends Music therapist:    Marital Status:   Intimate Partner Violence:    Fear of Current or Ex-Partner:    Emotionally Abused:    Physically Abused:    Sexually Abused:     Outpatient Medications Prior to Visit   Medication Sig Dispense Refill   aspirin EC 81 MG tablet Take 81 mg by mouth daily.     Calcium Carb-Cholecalciferol (CALCIUM + D3 PO) Take 1 tablet by mouth daily.     cephALEXin (KEFLEX) 500 MG capsule Take 1 capsule (500 mg total) by mouth 3 (three) times daily. 90 capsule 11   ERLEADA 60 MG tablet Take 240 mg by mouth daily.      acetaminophen (TYLENOL) 500  MG tablet Take 1,000 mg by mouth 2 (two) times daily as needed for moderate pain or headache. (Patient not taking: Reported on 04/27/2020)     HYDROcodone-acetaminophen (NORCO) 7.5-325 MG tablet Take 1-2 tablets by mouth every 6 (six) hours as needed for moderate pain. (Patient not taking: Reported on 04/27/2020) 40 tablet 0   Trospium Chloride 60 MG CP24 Take 60 mg by mouth daily.  (Patient not taking: Reported on 04/27/2020)     No facility-administered medications prior to visit.    Allergies  Allergen Reactions   Adhesive [Tape] Rash    Paper tape is ok    ROS Review of Systems  Constitutional: Negative for appetite change, chills, fatigue and fever.  HENT: Negative for congestion, dental problem, ear pain and sore throat.   Eyes: Negative for discharge, redness and visual disturbance.  Respiratory: Negative for cough, chest tightness, shortness of breath and wheezing.   Cardiovascular: Negative for chest pain, palpitations and leg swelling.  Gastrointestinal: Negative for abdominal pain, blood in stool, diarrhea, nausea and vomiting.  Genitourinary: Negative for difficulty urinating, dysuria, flank pain, frequency, hematuria and urgency.  Musculoskeletal: Negative for arthralgias, back pain, joint swelling, myalgias and neck stiffness.  Skin: Negative for pallor and rash.  Neurological: Negative for dizziness, speech difficulty, weakness and headaches.  Hematological: Negative for adenopathy. Does not bruise/bleed easily.  Psychiatric/Behavioral: Negative for confusion and sleep disturbance. The patient is not  nervous/anxious.     PE; Vitals with BMI 04/27/2020 04/24/2020 03/09/2020  Height 6' 1.5" 6\' 3"  6' 1.5"  Weight 255 lbs 252 lbs 255 lbs  BMI 33.18 16.1 09.60  Systolic 454 098 119  Diastolic 80 81 72  Pulse 64 61 65  O2 sat on RA today is 97%  Gen: Alert, well appearing.  Patient is oriented to person, place, time, and situation. AFFECT: pleasant, lucid thought and speech. ENT: Ears: EACs clear, normal epithelium.  TMs with good light reflex and landmarks bilaterally.  Eyes: no injection, icteris, swelling, or exudate.  EOMI, PERRLA. Nose: no drainage or turbinate edema/swelling.  No injection or focal lesion.  Mouth: lips without lesion/swelling.  Oral mucosa pink and moist.  Dentition intact and without obvious caries or gingival swelling.  Oropharynx without erythema, exudate, or swelling.  Neck: supple/nontender.  No LAD, mass, or TM.  Carotid pulses 2+ bilaterally, without bruits. CV: RRR, no m/r/g.   LUNGS: CTA bilat, nonlabored resps, good aeration in all lung fields. ABD: soft, NT, ND, BS normal.  No hepatospenomegaly or mass.  No bruits. EXT: no clubbing or cyanosis. R LL 1+ pitting and trace L LL pitting edema.  No hip tenderness. Musculoskeletal: no joint swelling, erythema, warmth, or tenderness.  ROM of all joints intact. Skin - no sores or suspicious lesions or rashes or color changes   Pertinent labs:  Lab Results  Component Value Date   TSH 2.07 02/10/2017   Lab Results  Component Value Date   WBC 7.0 02/14/2020   HGB 12.7 (L) 02/14/2020   HCT 40.0 02/14/2020   MCV 92.8 02/14/2020   PLT 309 02/14/2020   Lab Results  Component Value Date   CREATININE 1.18 02/14/2020   BUN 18 02/14/2020   NA 137 02/14/2020   K 4.6 02/14/2020   CL 106 02/14/2020   CO2 25 02/14/2020   Lab Results  Component Value Date   ALT 9 04/22/2019   AST 11 04/22/2019   ALKPHOS 88 04/22/2019   BILITOT 0.5 04/22/2019   Lab Results  Component Value Date   CHOL 158 04/22/2019   Lab  Results  Component Value Date   HDL 32.30 (L) 04/22/2019   Lab Results  Component Value Date   LDLCALC 113 (H) 04/22/2019   Lab Results  Component Value Date   TRIG 63.0 04/22/2019   Lab Results  Component Value Date   CHOLHDL 5 04/22/2019   Lab Results  Component Value Date   PSA 0.12 02/21/2020   PSA 0.21 11/01/2019   PSA 5.76 06/19/2019   Lab Results  Component Value Date   HGBA1C 5.4 05/27/2013   ASSESSMENT AND PLAN:   Health maintenance exam: Reviewed age and gender appropriate health maintenance issues (prudent diet, regular exercise, health risks of tobacco and excessive alcohol, use of seatbelts, fire alarms in home, use of sunscreen).  Also reviewed age and gender appropriate health screening as well as vaccine recommendations. Vaccines: all UTD including covid 19. Labs: CBC, CMET, FLP. Prostate ca screening: hx of prostate ca and is followed by urol. Colon ca screening: next colonoscopy due 2025  An After Visit Summary was printed and given to the patient.  FOLLOW UP:  Return for annual CPE (fasting).  Signed:  Crissie Sickles, MD           04/27/2020

## 2020-04-27 NOTE — Patient Instructions (Signed)

## 2020-05-01 ENCOUNTER — Telehealth: Payer: Self-pay

## 2020-05-01 ENCOUNTER — Other Ambulatory Visit: Payer: Self-pay

## 2020-05-01 DIAGNOSIS — E7800 Pure hypercholesterolemia, unspecified: Secondary | ICD-10-CM

## 2020-05-01 MED ORDER — ATORVASTATIN CALCIUM 20 MG PO TABS
20.0000 mg | ORAL_TABLET | Freq: Every day | ORAL | 2 refills | Status: DC
Start: 2020-05-01 — End: 2020-07-24

## 2020-05-01 NOTE — Telephone Encounter (Signed)
Returning call from last week regarding lab results.  Please call after lunch [phone]

## 2020-05-01 NOTE — Telephone Encounter (Signed)
Patient advised of results/recommendations.

## 2020-05-08 ENCOUNTER — Encounter: Payer: Self-pay | Admitting: Family Medicine

## 2020-05-15 LAB — PSA: PSA: 0.054

## 2020-05-15 LAB — TESTOSTERONE: Testosterone: 15.1

## 2020-05-22 DIAGNOSIS — C775 Secondary and unspecified malignant neoplasm of intrapelvic lymph nodes: Secondary | ICD-10-CM | POA: Diagnosis not present

## 2020-05-22 DIAGNOSIS — C61 Malignant neoplasm of prostate: Secondary | ICD-10-CM | POA: Diagnosis not present

## 2020-05-24 ENCOUNTER — Encounter: Payer: Self-pay | Admitting: Family Medicine

## 2020-07-04 ENCOUNTER — Encounter: Payer: Self-pay | Admitting: Family Medicine

## 2020-07-24 ENCOUNTER — Other Ambulatory Visit: Payer: Self-pay | Admitting: Family Medicine

## 2020-07-26 NOTE — Assessment & Plan Note (Signed)
He is recovering nicely and tolerating oral cephalexin well following recent surgery for recurrent MSSA prosthetic hip infection.  He will continue cephalexin and follow-up in 3 months.

## 2020-07-27 ENCOUNTER — Ambulatory Visit: Payer: PPO | Admitting: Internal Medicine

## 2020-07-27 ENCOUNTER — Other Ambulatory Visit: Payer: Self-pay

## 2020-07-27 ENCOUNTER — Encounter: Payer: Self-pay | Admitting: Internal Medicine

## 2020-07-27 DIAGNOSIS — T8452XD Infection and inflammatory reaction due to internal left hip prosthesis, subsequent encounter: Secondary | ICD-10-CM | POA: Diagnosis not present

## 2020-07-27 MED ORDER — CEPHALEXIN 500 MG PO CAPS: 500.0000 mg | ORAL_CAPSULE | Freq: Two times a day (BID) | ORAL | 11 refills | Status: DC

## 2020-07-27 NOTE — Assessment & Plan Note (Signed)
His infection has responded well to recent incision and drainage and initial antibiotic therapy.  However, given the persistent, recurrent nature of his infection I am somewhat doubtful that it can be cured.  I talked to him about management options and he prefers to stay on cephalexin for now.  I will repeat his inflammatory markers and decrease his cephalexin to twice daily.  He will follow-up here in 3 months.

## 2020-07-27 NOTE — Progress Notes (Signed)
[facility] for Infectious Disease  Patient Active Problem List   Diagnosis Date Noted  . Prosthetic joint infection of left hip ([place]) 02/10/2020    Priority: High  . Prosthetic hip infection, subsequent encounter 02/11/2020  . Loosening of prosthetic hip ([place]) 09/08/2017  . 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 Pain in left hip 09/01/2017  . History of revision of total replacement of left hip joint 09/01/2017  . Encounter for surveillance of recalled total hip arthroplasty hardware ([place]) 06/07/2016  . Status post revision of total hip replacement 06/07/2016  . 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 Red blood cell abnormality 02/11/2016  . Elevated blood pressure reading without diagnosis of hypertension 07/01/2013  . Peripheral neuropathy 06/02/2013  . Health maintenance examination 11/26/2011  . Prostate cancer ([place]) 05/21/2010  . Obesity, Class I, BMI 30-34.9 05/18/2010  . Venous (peripheral) insufficiency 05/18/2010  . Osteoarthrosis involving lower leg 05/18/2010    Patient's Medications  New Prescriptions   No medications on file  Previous Medications   ACETAMINOPHEN (TYLENOL) 500 MG TABLET    Take 1,000 mg by mouth 2 (two) times daily as needed for moderate pain or headache.    ASPIRIN EC 81 MG TABLET    Take 81 mg by mouth daily.   ATORVASTATIN (LIPITOR) 20 MG TABLET    TAKE 1 TABLET BY MOUTH EVERY DAY   CALCIUM CARB-CHOLECALCIFEROL (CALCIUM + D3 PO)    Take 1 tablet by mouth daily.   ERLEADA 60 MG TABLET    Take 240 mg by mouth daily.    TROSPIUM CHLORIDE 60 MG CP24    Take 1 capsule by mouth daily.  Modified Medications   Modified Medication Previous Medication   CEPHALEXIN (KEFLEX) 500 MG CAPSULE cephALEXin (KEFLEX) 500 MG capsule      Take 1 capsule (500 mg total) by mouth 2 (two) times daily.    Take 1 capsule (500 mg total) by mouth 3 (three) times daily.  Discontinued Medications   No medications  on file    Subjective: Odas is in for his routine follow-up visit.  He underwent left total hip arthroplasty in 2008. He did well until he developed failure of the acetabular component requiring revision in September 2017. Operative cultures grew MSSA. He recalls being on a 4-week course of IV antibiotic following surgery. He did well until late 2018. He required repeat revision in January of that year and cultures grew MSSA again. He was treated with a 6-week course of IV antibiotics and did well until recently starting to have more pain. MRI showed a fluid collection in the left hip. He was admitted and underwent incision and drainage and antibiotic bead placement on 02/11/2020. Cultures grew MSSA again. He completed 6 weeks of IV cefazolin and oral rifampin on 03/17/2020 before converting to oral cephalexin alone.    He has not had any problems tolerating cephalexin he is feeling much improved.  He is not having any hip pain or groin requiring any pain medication.    Review of Systems: Review of Systems  Constitutional: Negative for chills, diaphoresis and fever.  Gastrointestinal: Negative for diarrhea, nausea and vomiting.  Musculoskeletal: Positive for joint pain.       He is not having any left hip pain but is bothered by degenerative arthritis in his right knee.    Past Medical History:  Diagnosis Date  . Anterior dislocation of right shoulder 09/2013  Reduced in ED under sedation.  Dislocated posteriorly 2015, ? partial RC tear, has f/u planned with Dr. Marlou Sa--? surgery? possible plan for 2016.  [name] Blood donor, whole blood    Has donated approx 30 gallons in his lifetime  . Cancer [facility]) 2012   Prostate cancer   . Chronic left hip pain    Severe DJD.  THA 2008.  Left acetabular revision in 2017 AND 2019.  [name] Chronic pain syndrome    Candidate for spinal cord stimulator (03/2017)  . Chronic renal insufficiency, stage II (mild)   . Chronic venous insufficiency    +compression  hose  . DDD (degenerative disc disease), lumbar 2017   Dr. Noe Gens ortho.  He is now wearing a new leg brace.  Has had ESI and will get another.  . Diverticulosis    +redundant colon  . DVT (deep venous thrombosis) ([place]) 06/14/2016   provoked, bilateral LL's; in post-op setting s/p hip surgery.  Xarelto x 21moat full dosing, then 6 more months at 1/2 dosing, then stopped xarelto.    . Foot drop, left    s/p hip fracture 1969  . Hx of adenomatous colonic polyps    polypectomy 2007; 2012; 2015; 05/2019; recall 566yr . Hypercholesterolemia 02/2017   Recommended statin 02/10/17 but pt declined, wanted to do TLC trial first. Rec'd statin 04/2020  . Osteoarthritis    knees and hips primarily  . Post laminectomy syndrome    improved with spinal cord stim-->stim to be removed summer/fall 2019.  . 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 Recurrence 2020->started eligard and apalutamide. PSA 0.16 Feb 2020 urol f/u. .0[name]4 Aug 2021 urol.  . [place] ([place]; 02/2020   MSSA; admitted for I&D 02/11/20->plan for longterm IV abx after  . Urge incontinence    vesicare helpful    Social History   Tobacco Use  . Smoking status: Never Smoker  . Smokeless tobacco: Never Used  Vaping Use  . Vaping Use: Never used  Substance Use Topics  . Alcohol use: No  . Drug use: No    Family History  Problem Relation Age of Onset  . Lung cancer Mother   . Heart disease Father   . Colon cancer Neg Hx   . Esophageal cancer Neg Hx   . Rectal cancer Neg Hx   . Stomach cancer Neg Hx   . Colon polyps Neg Hx     Allergies  Allergen Reactions  . Adhesive [Tape] Rash    Paper tape is ok    Objective: Vitals:   07/27/20 0924  BP: (!) 166/84  Pulse: (!) 57  Temp: 98 F (36.7 C)  TempSrc: Oral  SpO2: 97%  Weight: 262 lb (118.8 kg)  Height: '6\' 1"'  (1.854 m)   Body mass index is 34.57  kg/m.  Physical Exam Cardiovascular:     Rate and Rhythm: Normal rate and regular rhythm.     Heart sounds: No murmur heard.   Pulmonary:     Effort: Pulmonary effort is normal.     Breath sounds: Normal breath sounds.  Musculoskeletal:     Comments: His hip incision has healed.     Lab Results 03/07/2020 ESR normal at 22 CRP elevated at 14   Problem List Items Addressed This Visit      High   Prosthetic [place] infection of left [place] ([place]   His infection has  responded well to recent incision and drainage and initial antibiotic therapy.  However, given the persistent, recurrent nature of his infection I am somewhat doubtful that it can be cured.  I talked to him about management options and he prefers to stay on cephalexin for now.  I will repeat his inflammatory markers and decrease his cephalexin to twice daily.  He will follow-up here in 3 months.      Relevant Medications   cephALEXin (KEFLEX) 500 MG capsule   Other Relevant Orders   C-reactive protein   Sedimentation rate       Michel Bickers, MD [facility] [place] [phone] pager   [phone] cell 07/27/2020, 9:49 AM

## 2020-07-28 LAB — SEDIMENTATION RATE: Sed Rate: 2 mm/h (ref 0–20)

## 2020-07-28 LAB — C-REACTIVE PROTEIN: CRP: 10.8 mg/L — ABNORMAL HIGH (ref <8.0)

## 2020-08-01 ENCOUNTER — Ambulatory Visit (INDEPENDENT_AMBULATORY_CARE_PROVIDER_SITE_OTHER): Payer: PPO

## 2020-08-01 ENCOUNTER — Other Ambulatory Visit: Payer: Self-pay

## 2020-08-01 DIAGNOSIS — E7800 Pure hypercholesterolemia, unspecified: Secondary | ICD-10-CM | POA: Diagnosis not present

## 2020-08-01 DIAGNOSIS — Z23 Encounter for immunization: Secondary | ICD-10-CM

## 2020-08-01 LAB — LIPID PANEL
Cholesterol: 148 mg/dL (ref 0–200)
HDL: 43.6 mg/dL (ref >39.00)
LDL Cholesterol: 87 mg/dL (ref 0–99)
NonHDL: 104.2
Total CHOL/HDL Ratio: 3
Triglycerides: 84 mg/dL (ref 0.0–149.0)
VLDL: 16.8 mg/dL (ref 0.0–40.0)

## 2020-08-02 ENCOUNTER — Other Ambulatory Visit: Payer: Self-pay

## 2020-08-02 MED ORDER — ATORVASTATIN CALCIUM 20 MG PO TABS: 20.0000 mg | ORAL_TABLET | Freq: Every day | ORAL | 3 refills | Status: DC

## 2020-08-21 DIAGNOSIS — C61 Malignant neoplasm of prostate: Secondary | ICD-10-CM | POA: Diagnosis not present

## 2020-08-21 LAB — PSA: PSA: 0.034

## 2020-08-21 LAB — TESTOSTERONE: Testosterone: 11.2

## 2020-08-28 DIAGNOSIS — C775 Secondary and unspecified malignant neoplasm of intrapelvic lymph nodes: Secondary | ICD-10-CM | POA: Diagnosis not present

## 2020-08-28 DIAGNOSIS — R3915 Urgency of urination: Secondary | ICD-10-CM | POA: Diagnosis not present

## 2020-08-28 DIAGNOSIS — Z5111 Encounter for antineoplastic chemotherapy: Secondary | ICD-10-CM | POA: Diagnosis not present

## 2020-08-28 DIAGNOSIS — C61 Malignant neoplasm of prostate: Secondary | ICD-10-CM | POA: Diagnosis not present

## 2020-09-27 ENCOUNTER — Ambulatory Visit: Payer: PPO | Admitting: Orthopaedic Surgery

## 2020-09-27 ENCOUNTER — Ambulatory Visit (INDEPENDENT_AMBULATORY_CARE_PROVIDER_SITE_OTHER): Payer: PPO

## 2020-09-27 ENCOUNTER — Other Ambulatory Visit: Payer: Self-pay

## 2020-09-27 ENCOUNTER — Encounter: Payer: Self-pay | Admitting: Orthopaedic Surgery

## 2020-09-27 DIAGNOSIS — G8929 Other chronic pain: Secondary | ICD-10-CM

## 2020-09-27 DIAGNOSIS — M25561 Pain in right knee: Secondary | ICD-10-CM | POA: Diagnosis not present

## 2020-09-27 DIAGNOSIS — Z96642 Presence of left artificial hip joint: Secondary | ICD-10-CM

## 2020-09-27 DIAGNOSIS — T8452XD Infection and inflammatory reaction due to internal left hip prosthesis, subsequent encounter: Secondary | ICD-10-CM | POA: Diagnosis not present

## 2020-09-27 MED ORDER — LIDOCAINE HCL 1 % IJ SOLN
3.0000 mL | INTRAMUSCULAR | Status: AC | PRN
  Administered 2020-09-27: 3 mL

## 2020-09-27 MED ORDER — METHYLPREDNISOLONE ACETATE 40 MG/ML IJ SUSP
40.0000 mg | INTRAMUSCULAR | Status: AC | PRN
  Administered 2020-09-27: 40 mg via INTRA_ARTICULAR

## 2020-09-27 NOTE — Progress Notes (Signed)
Office Visit Note   Patient: [name]           Date of Birth: 05-16-1946           MRN: [id number] Visit Date: 09/27/2020              Requested by: Tammi Sou, MD [address] [place],  [place] [address] PCP: Tammi Sou, MD   Assessment & Plan: Visit Diagnoses:  1. History of revision of total replacement of left hip joint   2. 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 Chronic pain of right knee     Plan: From a right knee standpoint, I did offer steroid injection in his right knee and he agreed to this and tolerated it well. From a hip standpoint for his left hip, follow-up can be in 1 year unless there are issues at all. He last had an irrigation and debridement of that hip 7 months ago. This may be something that he would eventually need again if things worsen at all. We had a long thorough discussion about his hip. All questions and concerns were answered addressed. If things change at all he needs to let me know immediately and he knows to do that.  Follow-Up Instructions: Return in about 1 year (around 09/27/2021).   Orders:  Orders Placed This Encounter  Procedures  . Large Joint Inj  . XR HIP UNILAT W OR W/O PELVIS 1V LEFT   No orders of the defined types were placed in this encounter.     Procedures: Large Joint Inj: R knee on 09/27/2020 8:28 AM Indications: diagnostic evaluation and pain Details: 22 G 1.5 in needle, superolateral approach  Arthrogram: No  Medications: 3 mL lidocaine 1 %; 40 mg methylPREDNISolone acetate 40 MG/ML Outcome: tolerated well, no immediate complications Procedure, treatment alternatives, risks and benefits explained, specific risks discussed. Consent was given by the patient. Immediately prior to procedure a time out was called to verify the correct patient, procedure, equipment, support staff and site/side marked as required. Patient was prepped and draped in the usual  sterile fashion.       Clinical Data: No additional findings.   Subjective: Chief Complaint  Patient presents with  . Left Hip - Follow-up  The patient comes in today for continued follow-up as a relates to chronic left hip infection. He is under suppression with antibiotics. He is followed regularly by the infectious disease clinic. He denies any significant left hip pain today and denies any recent illnesses. I did see his recent CRP was elevated but his sed rate was normal. He has chronic right knee pain. We talked about a steroid injection in his right knee today. He is now diabetic. He still ambulates with a walking cane.  HPI  Review of Systems Today he denies any headache, chest pain, short of breath, fever, chills, nausea, vomiting  Objective: Vital Signs: There were no vitals taken for this visit.  Physical Exam He is alert and orient x3 and in no acute distress Ortho Exam Examination of his left hip shows it moves smoothly and fluidly. There is no swelling around the hip itself. The incision looks good. There is no redness or induration. His right knee does show varus malalignment with pain throughout his arc of motion. There is no knee joint effusion. He has obvious osteoarthritis of the right knee. Specialty Comments:  No specialty comments available.  Imaging: XR HIP UNILAT  W OR W/O PELVIS 1V LEFT  Result Date: 09/27/2020 AP pelvis and lateral left hip was compared to previous x-rays. There is no significant change in placement of the hardware or worrisome findings. There is a revision hip arthroplasty in place.    PMFS History: Patient Active Problem List   Diagnosis Date Noted  . Prosthetic hip infection, subsequent encounter 02/11/2020  . 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 Loosening of prosthetic hip ([place]) 09/08/2017  . 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 Pain in left hip 09/01/2017  . History of revision of total  replacement of left hip joint 09/01/2017  . Encounter for surveillance of recalled total hip arthroplasty hardware ([place]) 06/07/2016  . Status post revision of total hip replacement 06/07/2016  . 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 Red blood cell abnormality 02/11/2016  . Elevated blood pressure reading without diagnosis of hypertension 07/01/2013  . Peripheral neuropathy 06/02/2013  . Health maintenance examination 11/26/2011  . Prostate cancer ([place]) 05/21/2010  . Obesity, Class I, BMI 30-34.9 05/18/2010  . Venous (peripheral) insufficiency 05/18/2010  . Osteoarthrosis involving lower leg 05/18/2010   Past Medical History:  Diagnosis Date  . Anterior dislocation of right shoulder 09/2013   Reduced in ED under sedation.  Dislocated posteriorly 2015, ? partial RC tear, has f/u planned with Dr. Marlou Sa--? surgery? possible plan for 2016.  [name] Blood donor, whole blood    Has donated approx 30 gallons in his lifetime  . Cancer [facility]) 2012   Prostate cancer   . Chronic left hip pain    Severe DJD.  THA 2008.  Left acetabular revision in 2017 AND 2019.  [name] Chronic pain syndrome    Candidate for spinal cord stimulator (03/2017)  . Chronic renal insufficiency, stage II (mild)   . Chronic venous insufficiency    +compression hose  . DDD (degenerative disc disease), lumbar 2017   Dr. Noe Gens ortho.  He is now wearing a new leg brace.  Has had ESI and will get another.  . Diverticulosis    +redundant colon  . DVT (deep venous thrombosis) ([place]) 06/14/2016   provoked, bilateral LL's; in post-op setting s/p hip surgery.  Xarelto x 34mo at full dosing, then 6 more months at 1/2 dosing, then stopped xarelto.    . Foot drop, left    s/p hip fracture 1969  . Hx of adenomatous colonic polyps    polypectomy 2007; 2012; 2015; 05/2019; recall 109yrs  . Hypercholesterolemia 02/2017   Recommended statin 02/10/17 but pt declined, wanted to do TLC trial first. Rec'd statin  04/2020  . Osteoarthritis    knees and hips primarily  . Post laminectomy syndrome    improved with spinal cord stim-->stim to be removed summer/fall 2019.  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 Recurrence 2020->started eligard and apalutamide. PSA 0.16 Feb 2020 urol f/u. [name]054 Aug 2021 urol.  . 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 Urge incontinence    vesicare helpful    Family History  Problem Relation Age of Onset  . Lung cancer Mother   . Heart disease Father   . Colon cancer Neg Hx   . Esophageal cancer Neg Hx   . Rectal cancer Neg Hx   . Stomach cancer Neg Hx   . Colon polyps  Neg Hx     Past Surgical History:  Procedure Laterality Date  . ANTERIOR HIP REVISION Left 06/07/2016   Procedure: LEFT ANTERIOR HIP ACETABULAR REVISION;  Surgeon: Mcarthur Rossetti, MD;  Location: [facility];  Service: Orthopedics;  Laterality: Left;  . ANTERIOR HIP REVISION Left 10/09/2017   Procedure: Left hip acetabular revision;  Surgeon: Mcarthur Rossetti, MD;  Location: [facility];  Service: Orthopedics;  Laterality: Left;  . arthroscopy left shoulder Left 07  . bilateral carpal tunnel release  2003  . BILATERAL CARPAL TUNNEL RELEASE    . COLONOSCOPY  2007;2012;2015;05/2019   04/12/2014 Tubular adenoma x 1; 05/2019 adenomatous polyp; recall 5 yrs.  . FRACTURE SURGERY Left   . INCISION AND DRAINAGE HIP Left 02/11/2020   Procedure: IRRIGATION AND DEBRIDEMENT LEFT HIP WITH ANTIBIOTIC SPACER PLACEMENT;  Surgeon: Mcarthur Rossetti, MD;  Location: [facility];  Service: Orthopedics;  Laterality: Left;  . JOINT REPLACEMENT Left    hip  . LUMBAR LAMINECTOMY/DECOMPRESSION MICRODISCECTOMY Left 03/28/2016   Procedure: Left Lumbar three-four Laminoforaminotomy;  Surgeon: Erline Levine, MD;  Location: [place] [facility];  Service: Neurosurgery;  Laterality:  Left;  left  . POLYPECTOMY    . RADIOACTIVE SEED IMPLANT  2012  . ROTATOR CUFF REPAIR  2006   right  . seed implant prostate  2012  . SHOULDER ARTHROSCOPY Left   . SPINAL CORD STIMULATOR IMPLANT  06/2017   Very helpful  . TOTAL HIP ARTHROPLASTY  2008   left ([facility])  . VASECTOMY     Social History   Occupational History  . Not on file  Tobacco Use  . Smoking status: Never Smoker  . Smokeless tobacco: Never Used  Vaping Use  . Vaping Use: Never used  Substance and Sexual Activity  . Alcohol use: No  . Drug use: No  . Sexual activity: Not on file

## 2020-10-17 ENCOUNTER — Ambulatory Visit: Payer: PPO | Admitting: Orthopaedic Surgery

## 2020-11-01 ENCOUNTER — Ambulatory Visit: Payer: PPO | Admitting: Internal Medicine

## 2020-11-01 ENCOUNTER — Other Ambulatory Visit: Payer: Self-pay

## 2020-11-01 ENCOUNTER — Encounter: Payer: Self-pay | Admitting: Internal Medicine

## 2020-11-01 DIAGNOSIS — T8452XD Infection and inflammatory reaction due to internal left hip prosthesis, subsequent encounter: Secondary | ICD-10-CM

## 2020-11-01 DIAGNOSIS — T8452XA Infection and inflammatory reaction due to internal left hip prosthesis, initial encounter: Secondary | ICD-10-CM

## 2020-11-01 MED ORDER — CEPHALEXIN 500 MG PO CAPS: 500.0000 mg | ORAL_CAPSULE | Freq: Two times a day (BID) | ORAL | 11 refills | Status: DC

## 2020-11-01 NOTE — Assessment & Plan Note (Signed)
He is doing well on chronic suppressive cephalexin therapy for his recurrent MSSA left prosthetic hip infection.  He is aware that it is unclear if his infection is cured or simply dormant.  He favors continuing cephalexin for now.  He will follow-up in 6 months.

## 2020-11-01 NOTE — Progress Notes (Signed)
[facility] for Infectious Disease  Patient Active Problem List   Diagnosis Date Noted  . 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 Prosthetic hip infection, subsequent encounter 02/11/2020  . Loosening of prosthetic hip ([place]) 09/08/2017  . 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 Pain in left hip 09/01/2017  . History of revision of total replacement of left hip joint 09/01/2017  . Encounter for surveillance of recalled total hip arthroplasty hardware ([place]) 06/07/2016  . Status post revision of total hip replacement 06/07/2016  . 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 Red blood cell abnormality 02/11/2016  . Elevated blood pressure reading without diagnosis of hypertension 07/01/2013  . Peripheral neuropathy 06/02/2013  . Health maintenance examination 11/26/2011  . Prostate cancer ([place]) 05/21/2010  . Obesity, Class I, BMI 30-34.9 05/18/2010  . Venous (peripheral) insufficiency 05/18/2010  . Osteoarthrosis involving lower leg 05/18/2010    Patient's Medications  New Prescriptions   No medications on file  Previous Medications   ACETAMINOPHEN (TYLENOL) 500 MG TABLET    Take 1,000 mg by mouth 2 (two) times daily as needed for moderate pain or headache.    ASPIRIN EC 81 MG TABLET    Take 81 mg by mouth daily.   ATORVASTATIN (LIPITOR) 20 MG TABLET    Take 1 tablet (20 mg total) by mouth daily.   CALCIUM CARB-CHOLECALCIFEROL (CALCIUM + D3 PO)    Take 1 tablet by mouth daily.   ERLEADA 60 MG TABLET    Take 240 mg by mouth daily.    TROSPIUM CHLORIDE 60 MG CP24    Take 1 capsule by mouth daily.  Modified Medications   Modified Medication Previous Medication   CEPHALEXIN (KEFLEX) 500 MG CAPSULE cephALEXin (KEFLEX) 500 MG capsule      Take 1 capsule (500 mg total) by mouth 2 (two) times daily.    Take 1 capsule (500 mg total) by mouth 2 (two) times daily.  Discontinued Medications   No  medications on file    Subjective: [name] is in for his routine follow-up visit.  He underwent left total hip arthroplasty in 2008. He did well until he developed failure of the acetabular component requiring revision in September 2017. Operative cultures grew MSSA. He recalls being on a 4-week course of IV antibiotic following surgery. He did well until late 2018. He required repeat revision in January of that year and cultures grew MSSA again. He was treated with a 6-week course of IV antibiotics.  He did well until starting to have more pain last spring. An MRI showed a fluid collection in the left hip. He was admitted and underwent incision and drainage and antibiotic bead placement on 02/11/2020. Cultures grew MSSA again. He completed 6 weeks of IV cefazolin and oral rifampin on 03/17/2020 before converting to oral cephalexin alone.    He has not had any problems tolerating cephalexin he is feeling much improved.  He is not having any hip pain or groin requiring any pain medication.    Review of Systems: Review of Systems  Constitutional: Negative for chills, diaphoresis and fever.  Gastrointestinal: Negative for diarrhea, nausea and vomiting.  Musculoskeletal: Positive for joint pain.       He is not having any left hip pain but is bothered by degenerative arthritis in his right knee.    Past Medical History:  Diagnosis Date  . Anterior dislocation of  right shoulder 09/2013   Reduced in ED under sedation.  Dislocated posteriorly 2015, ? partial RC tear, has f/u planned with Dr. August Saucer--? surgery? possible plan for 2016.  [name] Blood donor, whole blood    Has donated approx 30 gallons in his lifetime  . Cancer [facility]) 2012   Prostate cancer   . Chronic left hip pain    Severe DJD.  THA 2008.  Left acetabular revision in 2017 AND 2019.  [name] Chronic pain syndrome    Candidate for spinal cord stimulator (03/2017)  . Chronic renal insufficiency, stage II (mild)   . Chronic venous  insufficiency    +compression hose  . DDD (degenerative disc disease), lumbar 2017   Dr. Joette Catching ortho.  He is now wearing a new leg brace.  Has had ESI and will get another.  . Diverticulosis    +redundant colon  . DVT (deep venous thrombosis) ([facility]) 06/14/2016   provoked, bilateral LL's; in post-op setting s/p hip surgery.  Xarelto x 7mo at full dosing, then 6 more months at 1/2 dosing, then stopped xarelto.    . Foot drop, left    s/p hip fracture 1969  . Hx of adenomatous colonic polyps    polypectomy 2007; 2012; 2015; 05/2019; recall 16yrs  . Hypercholesterolemia 02/2017   Recommended statin 02/10/17 but pt declined, wanted to do TLC trial first. Rec'd statin 04/2020  . Osteoarthritis    knees and hips primarily  . Post laminectomy syndrome    improved with spinal cord stim-->stim to be removed summer/fall 2019.  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 PSA 0.16 Feb 2020 urol f/u. [name]054 Aug 2021 urol.  . 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; 02/2020   MSSA; admitted for I&D 02/11/20->plan for longterm IV abx after  . Urge incontinence    vesicare helpful    Social History   Tobacco Use  . Smoking status: Never Smoker  . Smokeless tobacco: Never Used  Vaping Use  . Vaping Use: Never used  Substance Use Topics  . Alcohol use: No  . Drug use: No    Family History  Problem Relation Age of Onset  . Lung cancer Mother   . Heart disease Father   . Colon cancer Neg Hx   . Esophageal cancer Neg Hx   . Rectal cancer Neg Hx   . Stomach cancer Neg Hx   . Colon polyps Neg Hx     Allergies  Allergen Reactions  . Adhesive [Tape] Rash    Paper tape is ok    Objective: Vitals:   11/01/20 0845  BP: (!) 170/68  Pulse: (!) 53  Temp: 97.9 F (36.6 C)  TempSrc: Oral  SpO2: 99%  Weight: 250 lb (113.4 kg)  Height: 6\' 3"  (1.905 m)   Body  mass index is 31.25 kg/m.  Physical Exam Cardiovascular:     Rate and Rhythm: Normal rate and regular rhythm.     Heart sounds: No murmur heard.   Pulmonary:     Effort: Pulmonary effort is normal.     Breath sounds: Normal breath sounds.     Sed Rate  Date Value  07/27/2020 2 mm/h  02/14/2020 40 mm/hr (H)  01/11/2020 38 mm/h (H)   CRP  Date Value  07/27/2020 10.8 mg/L (H)  02/14/2020 8.6 mg/dL (H)  92/92/4462 86.3 mg/L (H)     Problem List Items  Addressed This Visit      High   Prosthetic joint infection of left hip ([place])    He is doing well on chronic suppressive cephalexin therapy for his recurrent MSSA left prosthetic hip infection.  He is aware that it is unclear if his infection is cured or simply dormant.  He favors continuing cephalexin for now.  He will follow-up in 6 months.      Relevant Medications   cephALEXin (KEFLEX) 500 MG capsule       Michel Bickers, MD [facility] [place] [facility] [phone] pager   [phone] cell 11/01/2020, 9:02 AM

## 2020-11-27 DIAGNOSIS — C61 Malignant neoplasm of prostate: Secondary | ICD-10-CM | POA: Diagnosis not present

## 2020-11-27 LAB — TESTOSTERONE: Testosterone: 14.6

## 2021-02-19 DIAGNOSIS — C61 Malignant neoplasm of prostate: Secondary | ICD-10-CM | POA: Diagnosis not present

## 2021-02-19 LAB — PSA: PSA: <0.015

## 2021-02-19 LAB — TESTOSTERONE: Testosterone: 13.8

## 2021-02-28 DIAGNOSIS — C775 Secondary and unspecified malignant neoplasm of intrapelvic lymph nodes: Secondary | ICD-10-CM | POA: Diagnosis not present

## 2021-02-28 DIAGNOSIS — C61 Malignant neoplasm of prostate: Secondary | ICD-10-CM | POA: Diagnosis not present

## 2021-03-07 ENCOUNTER — Encounter: Payer: Self-pay | Admitting: Family Medicine

## 2021-03-09 DIAGNOSIS — Z5111 Encounter for antineoplastic chemotherapy: Secondary | ICD-10-CM | POA: Diagnosis not present

## 2021-03-09 DIAGNOSIS — C61 Malignant neoplasm of prostate: Secondary | ICD-10-CM | POA: Diagnosis not present

## 2021-03-21 ENCOUNTER — Telehealth: Payer: Self-pay | Admitting: Family Medicine

## 2021-03-21 NOTE — Telephone Encounter (Signed)
Left message for patient to schedule Annual Wellness Visit.  Please schedule with Nurse Health Advisor Juleen Starr, RN at [facility].

## 2021-04-23 ENCOUNTER — Other Ambulatory Visit: Payer: Self-pay | Admitting: Internal Medicine

## 2021-04-23 ENCOUNTER — Other Ambulatory Visit: Payer: Self-pay

## 2021-04-23 DIAGNOSIS — T8452XD Infection and inflammatory reaction due to internal left hip prosthesis, subsequent encounter: Secondary | ICD-10-CM

## 2021-04-30 ENCOUNTER — Encounter: Payer: Self-pay | Admitting: Family Medicine

## 2021-04-30 ENCOUNTER — Ambulatory Visit (INDEPENDENT_AMBULATORY_CARE_PROVIDER_SITE_OTHER): Payer: PPO | Admitting: Family Medicine

## 2021-04-30 ENCOUNTER — Other Ambulatory Visit: Payer: Self-pay

## 2021-04-30 VITALS — BP 120/70 | HR 60 | Temp 97.7°F | Resp 16 | Ht 72.25 in | Wt 260.0 lb

## 2021-04-30 DIAGNOSIS — Z0000 Encounter for general adult medical examination without abnormal findings: Secondary | ICD-10-CM

## 2021-04-30 DIAGNOSIS — Z0001 Encounter for general adult medical examination with abnormal findings: Secondary | ICD-10-CM

## 2021-04-30 DIAGNOSIS — E7800 Pure hypercholesterolemia, unspecified: Secondary | ICD-10-CM | POA: Diagnosis not present

## 2021-04-30 DIAGNOSIS — K1370 Unspecified lesions of oral mucosa: Secondary | ICD-10-CM | POA: Diagnosis not present

## 2021-04-30 LAB — COMPREHENSIVE METABOLIC PANEL
ALT: 9 U/L (ref 0–53)
AST: 12 U/L (ref 0–37)
Albumin: 3.7 g/dL (ref 3.5–5.2)
Alkaline Phosphatase: 101 U/L (ref 39–117)
BUN: 16 mg/dL (ref 6–23)
CO2: 27 mEq/L (ref 19–32)
Calcium: 10.3 mg/dL (ref 8.4–10.5)
Chloride: 104 mEq/L (ref 96–112)
Creatinine, Ser: 1.03 mg/dL (ref 0.40–1.50)
GFR: 70.67 mL/min (ref >60.00)
Glucose, Bld: 91 mg/dL (ref 70–99)
Potassium: 4.7 mEq/L (ref 3.5–5.1)
Sodium: 139 mEq/L (ref 135–145)
Total Bilirubin: 0.5 mg/dL (ref 0.2–1.2)
Total Protein: 6.4 g/dL (ref 6.0–8.3)

## 2021-04-30 LAB — LIPID PANEL
Cholesterol: 140 mg/dL (ref 0–200)
HDL: 41.8 mg/dL (ref >39.00)
LDL Cholesterol: 81 mg/dL (ref 0–99)
NonHDL: 97.79
Total CHOL/HDL Ratio: 3
Triglycerides: 83 mg/dL (ref 0.0–149.0)
VLDL: 16.6 mg/dL (ref 0.0–40.0)

## 2021-04-30 LAB — CBC WITH DIFFERENTIAL/PLATELET
Basophils Absolute: 0 10*3/uL (ref 0.0–0.1)
Basophils Relative: 0.5 % (ref 0.0–3.0)
Eosinophils Absolute: 0.2 10*3/uL (ref 0.0–0.7)
Eosinophils Relative: 4 % (ref 0.0–5.0)
HCT: 46.4 % (ref 39.0–52.0)
Hemoglobin: 15.3 g/dL (ref 13.0–17.0)
Lymphocytes Relative: 28.5 % (ref 12.0–46.0)
Lymphs Abs: 1.7 10*3/uL (ref 0.7–4.0)
MCHC: 33 g/dL (ref 30.0–36.0)
MCV: 93.1 fl (ref 78.0–100.0)
Monocytes Absolute: 0.6 10*3/uL (ref 0.1–1.0)
Monocytes Relative: 10.3 % (ref 3.0–12.0)
Neutro Abs: 3.4 10*3/uL (ref 1.4–7.7)
Neutrophils Relative %: 56.7 % (ref 43.0–77.0)
Platelets: 233 10*3/uL (ref 150.0–400.0)
RBC: 4.99 Mil/uL (ref 4.22–5.81)
RDW: 14.9 % (ref 11.5–15.5)
WBC: 6 10*3/uL (ref 4.0–10.5)

## 2021-04-30 MED ORDER — ATORVASTATIN CALCIUM 20 MG PO TABS: 20.0000 mg | ORAL_TABLET | Freq: Every day | ORAL | 3 refills | Status: DC

## 2021-04-30 NOTE — Progress Notes (Signed)
Office Note 04/30/2021  CC:  Chief Complaint  Patient presents with   Annual Exam    Pt is fasting    HPI:  [name] is a 75 y.o. White male who is here for f/u HLD as well as annual health maintenance exam. A/P as of last visit: "Health maintenance exam: Reviewed age and gender appropriate health maintenance issues (prudent diet, regular exercise, health risks of tobacco and excessive alcohol, use of seatbelts, fire alarms in home, use of sunscreen).  Also reviewed age and gender appropriate health screening as well as vaccine recommendations. Vaccines: all UTD including covid 19. Labs: CBC, CMET, FLP. Prostate ca screening: hx of prostate ca and is followed by urol. Colon ca screening: next colonoscopy due 2025"  INTERIM HX: Feeling well.  No acute complaints. [place] to his daughters and walks back and forth to their house for exercise. Mows with riding lawnmower. 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    11/01/20 Inf dz f/u-->continuing chronic suppressive cephalexin therapy for his recurrent MSSA left prosthetic hip infection.  Has f/u with Dr. Megan Salon in 3d.   Past Medical History:  Diagnosis Date   Anterior dislocation of right shoulder 09/2013   Reduced in ED under sedation.  Dislocated posteriorly 2015, ? partial RC tear, has f/u planned with Dr. Marlou Sa--? surgery? possible plan for 2016.   Blood donor, whole blood    Has donated approx 30 gallons in his lifetime   Cancer [facility]) 2012   Prostate cancer    Chronic left hip pain    Severe DJD.  THA 2008.  Left acetabular revision in 2017 AND 2019.   Chronic pain syndrome    Candidate for spinal cord stimulator (03/2017)   Chronic renal insufficiency, stage II (mild)    Chronic venous insufficiency    +compression hose   DDD (degenerative disc disease), lumbar 2017   Dr. Noe Gens ortho.  He is now wearing a new leg brace.  Has had ESI and will get another.   Diverticulosis     +redundant colon   DVT (deep venous thrombosis) ([place]) 06/14/2016   provoked, bilateral LL's; in post-op setting s/p hip surgery.  Xarelto x 41moat full dosing, then 6 more months at 1/2 dosing, then stopped xarelto.     Foot drop, left    s/p hip fracture 1969   Hx of adenomatous colonic polyps    polypectomy 2007; 2012; 2015; 05/2019; recall 539yr  Hypercholesterolemia 02/2017   Recommended statin 02/10/17 but pt declined, wanted to do TLC trial first. Rec'd statin 04/2020   Osteoarthritis    knees and hips primarily   Post laminectomy syndrome    improved with spinal cord stim-->stim to be removed summer/fall 2019.   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 Recurrence 2020->started eligard and apalutamide. PSA 0.16 Feb 2020 urol f/u. .0[name]4 Aug 2021 urol. <0.015 02/2021.   Prosthetic hip infection ([place]; 02/2020   MSSA; admitted for I&D 02/11/20->plan for longterm IV abx after   Urge incontinence    vesicare helpful    Past Surgical History:  Procedure Laterality Date   ANTERIOR HIP REVISION Left 06/07/2016   Procedure: LEFT ANTERIOR HIP ACETABULAR REVISION;  Surgeon: ChMcarthur RossettiMD;  Location: [facility];  Service: Orthopedics;  Laterality: Left;   ANTERIOR HIP REVISION Left 10/09/2017   Procedure: Left hip acetabular revision;  Surgeon: BlMcarthur RossettiMD;  Location:  [facility];  Service: Orthopedics;  Laterality: Left;   arthroscopy left shoulder Left 07   bilateral carpal tunnel release  2003   BILATERAL CARPAL TUNNEL RELEASE     COLONOSCOPY  2007;2012;2015;05/2019   04/12/2014 Tubular adenoma x 1; 05/2019 adenomatous polyp; recall 5 yrs.   FRACTURE SURGERY Left    INCISION AND DRAINAGE HIP Left 02/11/2020   Procedure: IRRIGATION AND DEBRIDEMENT LEFT HIP WITH ANTIBIOTIC SPACER PLACEMENT;  Surgeon: Mcarthur Rossetti, MD;  Location: [facility];  Service: Orthopedics;  Laterality: Left;    JOINT REPLACEMENT Left    hip   LUMBAR LAMINECTOMY/DECOMPRESSION MICRODISCECTOMY Left 03/28/2016   Procedure: Left Lumbar three-four Laminoforaminotomy;  Surgeon: Erline Levine, MD;  Location: [place] NEURO ORS;  Service: Neurosurgery;  Laterality: Left;  left   POLYPECTOMY     RADIOACTIVE SEED IMPLANT  2012   ROTATOR CUFF REPAIR  2006   right   seed implant prostate  2012   SHOULDER ARTHROSCOPY Left    SPINAL CORD STIMULATOR IMPLANT  06/2017   Very helpful   TOTAL HIP ARTHROPLASTY  2008   left (cobalt chrome)   VASECTOMY      Family History  Problem Relation Age of Onset   Lung cancer Mother    Heart disease Father    Colon cancer Neg Hx    Esophageal cancer Neg Hx    Rectal cancer Neg Hx    Stomach cancer Neg Hx    Colon polyps Neg Hx     Social History   Socioeconomic History   Marital status: Married    Spouse name: Not on file   Number of children: Not on file   Years of education: Not on file   Highest education level: Not on file  Occupational History   Not on file  Tobacco Use   Smoking status: Never   Smokeless tobacco: Never  Vaping Use   Vaping Use: Never used  Substance and Sexual Activity   Alcohol use: No   Drug use: No   Sexual activity: Not on file  Other Topics Concern   Not on file  Social History Narrative   Married, 2 biologic children, 2 adopted, 5 GC.   Lives in [place].  Worked 35 yrs at [employer].   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   No formal exercise.  No T/A/Ds.   Enjoys woodworking.   Social Determinants of Health   Financial Resource Strain: Not on file  Food Insecurity: Not on file  Transportation Needs: Not on file  Physical Activity: Not on file  Stress: Not on file  Social Connections: Not on file  Intimate Partner Violence: Not on file    Outpatient Medications Prior to Visit  Medication Sig Dispense Refill   acetaminophen (TYLENOL) 500 MG tablet Take 1,000 mg by mouth 2 (two) times daily as needed  for moderate pain or headache.      aspirin EC 81 MG tablet Take 81 mg by mouth daily.     Calcium Carb-Cholecalciferol (CALCIUM + D3 PO) Take 1 tablet by mouth daily.     cephALEXin (KEFLEX) 500 MG capsule Take 1 capsule (500 mg total) by mouth 2 (two) times daily. 60 capsule 11   ERLEADA 60 MG tablet Take 240 mg by mouth daily.      solifenacin (VESICARE) 5 MG tablet Take 5 mg by mouth daily.     atorvastatin (LIPITOR) 20 MG tablet Take 1 tablet (20 mg total) by mouth  daily. 90 tablet 3   Trospium Chloride 60 MG CP24 Take 1 capsule by mouth daily. (Patient not taking: Reported on 04/30/2021)     No facility-administered medications prior to visit.    Allergies  Allergen Reactions   Adhesive [Tape] Rash    Paper tape is ok    ROS Review of Systems  Constitutional:  Negative for appetite change, chills, fatigue and fever.  HENT:  Negative for congestion, dental problem, ear pain and sore throat.   Eyes:  Negative for discharge, redness and visual disturbance.  Respiratory:  Negative for cough, chest tightness, shortness of breath and wheezing.   Cardiovascular:  Negative for chest pain, palpitations and leg swelling.  Gastrointestinal:  Negative for abdominal pain, blood in stool, diarrhea, nausea and vomiting.  Genitourinary:  Negative for difficulty urinating, dysuria, flank pain, frequency, hematuria and urgency.  Musculoskeletal:  Negative for arthralgias, back pain, joint swelling, myalgias and neck stiffness.  Skin:  Negative for pallor and rash.  Neurological:  Negative for dizziness, speech difficulty, weakness and headaches.  Hematological:  Negative for adenopathy. Does not bruise/bleed easily.  Psychiatric/Behavioral:  Negative for confusion and sleep disturbance. The patient is not nervous/anxious.    PE; Vitals with BMI 04/30/2021 11/01/2020 07/27/2020  Height 6' 0.25" '6\' 3"'$  '6\' 1"'$   Weight 260 lbs 250 lbs 262 lbs  BMI 35.02 [id number]  Systolic 123456 [id number]  Diastolic  70 68 84  Pulse 60 53 57     Gen: Alert, well appearing.  Patient is oriented to person, place, time, and situation. AFFECT: pleasant, lucid thought and speech. ENT: Ears: EACs clear, normal epithelium.  TMs with good light reflex and landmarks bilaterally.  Eyes: no injection, icteris, swelling, or exudate.  EOMI, PERRLA. Nose: no drainage or turbinate edema/swelling.  No injection or focal lesion.  Mouth: lips without lesion/swelling.  Oral mucosa pink and moist.  Dentition intact and without obvious caries or gingival swelling.  Oropharynx without erythema, exudate, or swelling.  There is a 3 mm oval violaceous discoloration in lowest part of ant tons pillar just posterior to his last mandib molar on the right. Neck: supple/nontender.  No LAD, mass, or TM.  Carotid pulses 2+ bilaterally, without bruits. CV: RRR, no m/r/g.   LUNGS: CTA bilat, nonlabored resps, good aeration in all lung fields. ABD: soft, NT, ND, BS normal.  No hepatospenomegaly or mass.  No bruits. EXT: no clubbing, cyanosis, or edema.  Musculoskeletal: no joint swelling, erythema, warmth, or tenderness.  ROM of all joints intact. Skin - no sores or suspicious lesions or rashes or color changes  Pertinent labs:  Lab Results  Component Value Date   TSH 2.07 02/10/2017   Lab Results  Component Value Date   WBC 5.2 04/27/2020   HGB 15.3 04/27/2020   HCT 46.6 04/27/2020   MCV 90.2 04/27/2020   PLT 225.0 04/27/2020   Lab Results  Component Value Date   CREATININE 1.22 04/27/2020   BUN 21 04/27/2020   NA 137 04/27/2020   K 4.7 04/27/2020   CL 104 04/27/2020   CO2 28 04/27/2020   Lab Results  Component Value Date   ALT 8 04/27/2020   AST 13 04/27/2020   ALKPHOS 85 04/27/2020   BILITOT 0.5 04/27/2020   Lab Results  Component Value Date   CHOL 148 08/01/2020   Lab Results  Component Value Date   HDL 43.60 08/01/2020   Lab Results  Component Value Date   LDLCALC 87 08/01/2020  Lab Results   Component Value Date   TRIG 84.0 08/01/2020   Lab Results  Component Value Date   CHOLHDL 3 08/01/2020   Lab Results  Component Value Date   PSA <0.015 02/19/2021   PSA 0.034 08/21/2020   PSA 0.054 05/15/2020   Lab Results  Component Value Date   HGBA1C 5.4 05/27/2013   ASSESSMENT AND PLAN:   1) HLD: tolerating atorvastatin '20mg'$  qd. FLP and hepatic panel today.  2) Oral lesion, pigmented, unknown etiology.  ? Small ecchymoses. Obs x 1 mo and return to see if this has spont resolved and if not then I'll get him to ENT for consideration of bx.  3) Health maintenance exam: Reviewed age and gender appropriate health maintenance issues (prudent diet, regular exercise, health risks of tobacco and excessive alcohol, use of seatbelts, fire alarms in home, use of sunscreen).  Also reviewed age and gender appropriate health screening as well as vaccine recommendations. Vaccines: ALL UTD. Labs:cbc, cmet, flp Prostate ca screening: n/a-->pt with hx prostate ca and followed regularly by urologist. Colon ca screening: recall 2025.  Forward results to Dr. Megan Salon.  An After Visit Summary was printed and given to the patient.  FOLLOW UP:  Return in about 4 weeks (around 05/28/2021) for f/u lesion in mouth.  Signed:  Crissie Sickles, MD           04/30/2021

## 2021-05-01 ENCOUNTER — Telehealth: Payer: Self-pay | Admitting: Family Medicine

## 2021-05-01 NOTE — Telephone Encounter (Signed)
Left message for patient to call back and schedule Medicare Annual Wellness Visit (AWV).   Please offer to do virtually or by telephone.   Last AWV:01/09/2017  Please schedule at anytime with Nurse Health Advisor.

## 2021-05-03 ENCOUNTER — Other Ambulatory Visit: Payer: Self-pay

## 2021-05-03 ENCOUNTER — Ambulatory Visit: Payer: PPO | Admitting: Internal Medicine

## 2021-05-03 ENCOUNTER — Encounter: Payer: Self-pay | Admitting: Internal Medicine

## 2021-05-03 DIAGNOSIS — T8452XD Infection and inflammatory reaction due to internal left hip prosthesis, subsequent encounter: Secondary | ICD-10-CM | POA: Diagnosis not present

## 2021-05-03 MED ORDER — CEPHALEXIN 500 MG PO CAPS: 500.0000 mg | ORAL_CAPSULE | Freq: Two times a day (BID) | ORAL | 11 refills | Status: DC

## 2021-05-03 NOTE — Progress Notes (Signed)
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   Pain in left hip 09/01/2017   History of revision of total replacement of left hip joint 09/01/2017   Encounter for surveillance of recalled total hip arthroplasty hardware ([place]) 06/07/2016   Status post revision of total hip replacement 06/07/2016   Spinal stenosis, lumbar region, 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    ASPIRIN EC 81 MG TABLET    Take 81 mg by mouth daily.   ATORVASTATIN (LIPITOR) 20 MG TABLET    Take 1 tablet (20 mg total) by mouth daily.   CALCIUM CARB-CHOLECALCIFEROL (CALCIUM + D3 PO)    Take 1 tablet by mouth daily.   ERLEADA 60 MG TABLET    Take 240 mg by mouth daily.    SOLIFENACIN (VESICARE) 5 MG TABLET    Take 5 mg by mouth daily.  Modified Medications   Modified Medication Previous Medication   CEPHALEXIN (KEFLEX) 500 MG CAPSULE cephALEXin (KEFLEX) 500 MG capsule      Take 1 capsule (500 mg total) by mouth 2 (two) times daily.    Take 1 capsule (500 mg total) by mouth 2 (two) times daily.  Discontinued Medications   No medications on file     Subjective: Jerrett is in for his routine follow-up visit.  He underwent left total hip arthroplasty in 2008. He did well until he developed failure of the acetabular component requiring revision in September 2017.  Operative cultures grew MSSA.  He recalls being on a 4-week course of IV antibiotic following surgery.  He did well until late 2018.  He required repeat revision in January of that year and cultures grew MSSA again.  He was treated with a 6-week course of IV antibiotics.  He did well until starting to have more pain last spring.  An MRI showed a fluid collection in the left hip.  He was admitted and underwent incision and drainage and antibiotic bead placement on 02/11/2020.  Cultures grew MSSA again.  He completed 6 weeks of IV cefazolin and oral rifampin on 03/17/2020 before converting to oral cephalexin alone.    He has not had any problems tolerating cephalexin.  Specifically, he has not had any nausea, vomiting or diarrhea.  He says that he will occasionally have some slight discomfort in both hips if he is mowing the lawn riding mower but otherwise he is doing well.    Review of Systems: Review of Systems  Constitutional:  Negative for chills, diaphoresis and fever.  Gastrointestinal:  Negative for diarrhea, nausea and vomiting.  Musculoskeletal:  Positive for joint pain.   Past Medical History:  Diagnosis Date   Anterior dislocation of right shoulder 09/2013   Reduced in ED under sedation.  Dislocated posteriorly 2015, ? partial RC tear, has f/u planned with Dr. Marlou Sa--? surgery? possible plan for 2016.   Blood donor, whole blood    Has donated approx 30 gallons in his lifetime   Chronic left hip pain    Severe DJD.  THA 2008.  Left acetabular revision in 2017 AND 2019.   Chronic pain syndrome    Candidate for spinal cord stimulator (03/2017)   Chronic renal insufficiency, stage II (mild)    Chronic venous insufficiency    +compression hose   DDD (degenerative disc  disease), lumbar 2017   Dr. Noe Gens ortho.  He is now wearing a new leg brace.  Has had ESI and will get another.   Diverticulosis    +redundant colon   DVT (deep venous thrombosis) ([place]) 06/14/2016   provoked, bilateral LL's; in post-op setting s/p hip surgery.  Xarelto x 41moat full dosing, then 6 more months at 1/2 dosing, then stopped xarelto.     Foot drop, left    s/p hip fracture 1969   Hx of adenomatous colonic polyps    polypectomy 2007; 2012; 2015; 05/2019; recall 518yr  Hypercholesterolemia 02/2017   Recommended statin 02/10/17 but pt declined, wanted to do TLC trial first. Rec'd statin 04/2020   Osteoarthritis    knees and hips primarily   Post laminectomy syndrome    improved with spinal cord stim-->stim to be removed summer/fall 2019.   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 Recurrence 2020->started eligard and apalutamide. PSA 0.16 Feb 2020 urol f/u. .0[name]4 Aug 2021 urol. <0.015 02/2021.   [place]; 02/2020   MSSA; admitted for I&D 02/11/20->plan for longterm IV abx after   Urge incontinence    vesicare helpful    Social History   Tobacco Use   Smoking status: Never   Smokeless tobacco: Never  Vaping Use   Vaping Use: Never used  Substance Use Topics   Alcohol use: No   Drug use: No    Family History  Problem Relation Age of Onset   Lung cancer Mother    Heart disease Father    Colon cancer Neg Hx    Esophageal cancer Neg Hx    Rectal cancer Neg Hx    Stomach cancer Neg Hx    Colon polyps Neg Hx     Allergies  Allergen Reactions   Adhesive [Tape] Rash    Paper tape is ok    Objective: Vitals:   05/03/21 0836  BP: (!) 149/77  Pulse: (!) 57  Temp: (!) 97.5 F (36.4 C)  TempSrc: Oral  Weight: 263 lb (119.3 kg)   Body mass index is 35.42 kg/m.  Physical Exam Cardiovascular:     Rate and Rhythm: Normal rate and regular rhythm.      Heart sounds: No murmur heard. Pulmonary:     Effort: Pulmonary effort is normal.     Breath sounds: Normal breath sounds.  Musculoskeletal:     Comments: He walks without a limp but with the aid of a cane.  Psychiatric:        Mood and Affect: Mood normal.    Sed Rate  Date Value  07/27/2020 2 mm/h  02/14/2020 40 mm/hr (H)  01/11/2020 38 mm/h (H)   CRP  Date Value  07/27/2020 10.8  mg/L (H)  02/14/2020 8.6 mg/dL (H)  01/11/2020 34.8 mg/L (H)     Problem List Items Addressed This Visit       High   Prosthetic joint infection of left hip (HCC)    Given the number of times he has relapsed with MSSA left prosthetic hip infection he is not interested in stopping suppressive cephalexin.  He will continue cephalexin and follow-up in 1 year.       Relevant Medications   cephALEXin (KEFLEX) 500 MG capsule    Michel Bickers, MD [facility] [phone] pager   [phone] cell 05/03/2021, 8:49 AM

## 2021-05-03 NOTE — Assessment & Plan Note (Signed)
Given the number of times he has relapsed with MSSA left prosthetic hip infection he is not interested in stopping suppressive cephalexin.  He will continue cephalexin and follow-up in 1 year.

## 2021-05-28 ENCOUNTER — Encounter: Payer: Self-pay | Admitting: Family Medicine

## 2021-05-28 ENCOUNTER — Other Ambulatory Visit: Payer: Self-pay

## 2021-05-28 ENCOUNTER — Ambulatory Visit (INDEPENDENT_AMBULATORY_CARE_PROVIDER_SITE_OTHER): Payer: PPO | Admitting: Family Medicine

## 2021-05-28 VITALS — BP 122/72 | HR 52 | Temp 97.8°F | Resp 16 | Ht 72.25 in | Wt 263.2 lb

## 2021-05-28 DIAGNOSIS — K1370 Unspecified lesions of oral mucosa: Secondary | ICD-10-CM

## 2021-05-28 NOTE — Progress Notes (Signed)
OFFICE VISIT  05/28/2021  CC:  Chief Complaint  Patient presents with   Follow-up    Lesion; located in mouth. Believes his toothbrush bristles are causing a bruise due to irritation.    HPI:    Patient is a 75 y.o. Caucasian male who presents for 1 mo f/u oral lesion. A/P as of last visit: "1) HLD: tolerating atorvastatin '20mg'$  qd. FLP and hepatic panel today.   2) Oral lesion, pigmented, unknown etiology.  ? Small ecchymoses. Obs x 1 mo and return to see if this has spont resolved and if not then I'll get him to ENT for consideration of bx.   3) Health maintenance exam: Reviewed age and gender appropriate health maintenance issues (prudent diet, regular exercise, health risks of tobacco and excessive alcohol, use of seatbelts, fire alarms in home, use of sunscreen).  Also reviewed age and gender appropriate health screening as well as vaccine recommendations. Vaccines: ALL UTD. Labs:cbc, cmet, flp Prostate ca screening: n/a-->pt with hx prostate ca and followed regularly by urologist. Colon ca screening: recall 2025."  INTERIM HX: Doing well. Says a few days after last appt he realized that he does brush too vigorously on back right molars and his tooth brush was hitting repetitively on the area of concern.  He stopped doing this about 3 wk ago.  No pain in the area (never was).  Past Medical History:  Diagnosis Date   Anterior dislocation of right shoulder 09/2013   Reduced in ED under sedation.  Dislocated posteriorly 2015, ? partial RC tear, has f/u planned with Dr. Marlou Sa--? surgery? possible plan for 2016.   Blood donor, whole blood    Has donated approx 30 gallons in his lifetime   Chronic left hip pain    Severe DJD.  THA 2008.  Left acetabular revision in 2017 AND 2019.   Chronic pain syndrome    Candidate for spinal cord stimulator (03/2017)   Chronic renal insufficiency, stage II (mild)    Chronic venous insufficiency    +compression hose   DDD (degenerative disc  disease), lumbar 2017   Dr. Noe Gens ortho.  He is now wearing a new leg brace.  Has had ESI and will get another.   Diverticulosis    +redundant colon   DVT (deep venous thrombosis) ([place]) 06/14/2016   provoked, bilateral LL's; in post-op setting s/p hip surgery.  Xarelto x 52moat full dosing, then 6 more months at 1/2 dosing, then stopped xarelto.     Foot drop, left    s/p hip fracture 1969   Hx of adenomatous colonic polyps    polypectomy 2007; 2012; 2015; 05/2019; recall 518yr  Hypercholesterolemia 02/2017   Recommended statin 02/10/17 but pt declined, wanted to do TLC trial first. Rec'd statin 04/2020   Osteoarthritis    knees and hips primarily   Post laminectomy syndrome    improved with spinal cord stim-->stim to be removed summer/fall 2019.   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 PSA 0.16 Feb 2020 urol f/u. .0[name]4 Aug 2021 urol. <0.015 02/2021.   [place]; 02/2020   MSSA; admitted for I&D 02/11/20->plan for longterm IV abx after   Urge incontinence    vesicare helpful    Past Surgical History:  Procedure Laterality Date   ANTERIOR HIP REVISION Left 06/07/2016   Procedure: LEFT ANTERIOR HIP ACETABULAR REVISION;  Surgeon: ChHarrell Gave  Kerry Fort, MD;  Location: [facility];  Service: Orthopedics;  Laterality: Left;   ANTERIOR HIP REVISION Left 10/09/2017   Procedure: Left hip acetabular revision;  Surgeon: Mcarthur Rossetti, MD;  Location: [facility];  Service: Orthopedics;  Laterality: Left;   arthroscopy left shoulder Left 07   bilateral carpal tunnel release  2003   BILATERAL CARPAL TUNNEL RELEASE     COLONOSCOPY  2007;2012;2015;05/2019   04/12/2014 Tubular adenoma x 1; 05/2019 adenomatous polyp; recall 5 yrs.   FRACTURE SURGERY Left    INCISION AND DRAINAGE HIP Left 02/11/2020   Procedure: IRRIGATION AND DEBRIDEMENT LEFT  HIP WITH ANTIBIOTIC SPACER PLACEMENT;  Surgeon: Mcarthur Rossetti, MD;  Location: [facility];  Service: Orthopedics;  Laterality: Left;   JOINT REPLACEMENT Left    hip   LUMBAR LAMINECTOMY/DECOMPRESSION MICRODISCECTOMY Left 03/28/2016   Procedure: Left Lumbar three-four Laminoforaminotomy;  Surgeon: Erline Levine, MD;  Location: [facility];  Service: Neurosurgery;  Laterality: Left;  left   POLYPECTOMY     RADIOACTIVE SEED IMPLANT  2012   ROTATOR CUFF REPAIR  2006   right   seed implant prostate  2012   SHOULDER ARTHROSCOPY Left    SPINAL CORD STIMULATOR IMPLANT  06/2017   Very helpful   TOTAL HIP ARTHROPLASTY  2008   left (cobalt chrome)   VASECTOMY      Outpatient Medications Prior to Visit  Medication Sig Dispense Refill   acetaminophen (TYLENOL) 500 MG tablet Take 1,000 mg by mouth 2 (two) times daily as needed for moderate pain or headache.      aspirin EC 81 MG tablet Take 81 mg by mouth daily.     atorvastatin (LIPITOR) 20 MG tablet Take 1 tablet (20 mg total) by mouth daily. 90 tablet 3   Calcium Carb-Cholecalciferol (CALCIUM + D3 PO) Take 1 tablet by mouth daily.     cephALEXin (KEFLEX) 500 MG capsule Take 1 capsule (500 mg total) by mouth 2 (two) times daily. 60 capsule 11   ERLEADA 60 MG tablet Take 240 mg by mouth daily.      solifenacin (VESICARE) 5 MG tablet Take 5 mg by mouth daily.     No facility-administered medications prior to visit.    Allergies  Allergen Reactions   Adhesive [Tape] Rash    Paper tape is ok    ROS As per HPI  PE: Vitals with BMI 05/28/2021 05/03/2021 04/30/2021  Height 6' 0.25" - 6' 0.25"  Weight 263 lbs 3 oz 263 lbs 260 lbs  BMI 35.46 0000000 [id number]  Systolic [id number]  Diastolic 72 77 70  Pulse 52 57 60     Gen: Alert, well appearing.  Patient is oriented to person, place, time, and situation. AFFECT: pleasant, lucid thought and speech. Mouth: there is a 3 mm oval violaceous discoloration in lowest part of anterior tonsillar  pillar just posterior to the last mandib molar on the right-->unchanged compared to exam 1 mo ago.  Non-tender.  LABS:  None today  IMPRESSION AND PLAN:  Mucosal pigmentation abnormality, unchanged. ? Repetitive trauma from toothbrush.  We discussed the fact that if he stopped hitting the area with tooth brush 3 wks ago then the area would be resolving if not completely gone. Discussed possible ENT referral to get their opinion. He chose to obs at this time.  His wife is a Copywriter, advertising and he says he have her monitor it.  An After Visit Summary was printed and given to the patient.  FOLLOW UP: Return for as needed.  Signed:  Crissie Sickles, MD           05/28/2021

## 2021-06-06 DIAGNOSIS — C775 Secondary and unspecified malignant neoplasm of intrapelvic lymph nodes: Secondary | ICD-10-CM | POA: Diagnosis not present

## 2021-06-06 DIAGNOSIS — C61 Malignant neoplasm of prostate: Secondary | ICD-10-CM | POA: Diagnosis not present

## 2021-06-06 LAB — PSA: PSA: <0.015

## 2021-06-06 LAB — TESTOSTERONE: Testosterone: 10.9

## 2021-06-13 ENCOUNTER — Other Ambulatory Visit: Payer: Self-pay | Admitting: Urology

## 2021-06-13 DIAGNOSIS — Z13820 Encounter for screening for osteoporosis: Secondary | ICD-10-CM

## 2021-06-13 DIAGNOSIS — C61 Malignant neoplasm of prostate: Secondary | ICD-10-CM

## 2021-06-20 ENCOUNTER — Other Ambulatory Visit: Payer: PPO

## 2021-06-21 ENCOUNTER — Other Ambulatory Visit: Payer: Self-pay

## 2021-06-21 ENCOUNTER — Ambulatory Visit
Admission: RE | Admit: 2021-06-21 | Discharge: 2021-06-21 | Disposition: A | Discharge status: Home / Self Care | Payer: PPO | Source: Ambulatory Visit | Attending: Urology | Admitting: Urology

## 2021-06-21 DIAGNOSIS — Z13820 Encounter for screening for osteoporosis: Secondary | ICD-10-CM | POA: Diagnosis not present

## 2021-06-21 DIAGNOSIS — C61 Malignant neoplasm of prostate: Secondary | ICD-10-CM

## 2021-07-05 ENCOUNTER — Telehealth: Payer: Self-pay | Admitting: Family Medicine

## 2021-07-05 NOTE — Telephone Encounter (Signed)
Left message for patient to call back and schedule Medicare Annual Wellness Visit (AWV) by video or phone  Last AWV 01/09/2017  Please schedule at any time with [facility].

## 2021-09-12 DIAGNOSIS — C61 Malignant neoplasm of prostate: Secondary | ICD-10-CM | POA: Diagnosis not present

## 2021-09-12 DIAGNOSIS — C775 Secondary and unspecified malignant neoplasm of intrapelvic lymph nodes: Secondary | ICD-10-CM | POA: Diagnosis not present

## 2021-09-12 DIAGNOSIS — Z5111 Encounter for antineoplastic chemotherapy: Secondary | ICD-10-CM | POA: Diagnosis not present

## 2021-09-12 LAB — PSA: PSA: <0.015

## 2021-09-12 LAB — TESTOSTERONE: Testosterone: 12.1

## 2021-09-19 ENCOUNTER — Other Ambulatory Visit (HOSPITAL_COMMUNITY): Payer: Self-pay | Admitting: Urology

## 2021-09-19 DIAGNOSIS — C61 Malignant neoplasm of prostate: Secondary | ICD-10-CM

## 2021-10-01 ENCOUNTER — Ambulatory Visit: Payer: PPO | Admitting: Orthopaedic Surgery

## 2021-10-02 ENCOUNTER — Ambulatory Visit (INDEPENDENT_AMBULATORY_CARE_PROVIDER_SITE_OTHER): Payer: PPO

## 2021-10-02 ENCOUNTER — Encounter: Payer: Self-pay | Admitting: Orthopaedic Surgery

## 2021-10-02 ENCOUNTER — Other Ambulatory Visit: Payer: Self-pay

## 2021-10-02 ENCOUNTER — Ambulatory Visit (INDEPENDENT_AMBULATORY_CARE_PROVIDER_SITE_OTHER): Payer: PPO | Admitting: Orthopaedic Surgery

## 2021-10-02 VITALS — Ht 72.25 in | Wt 263.0 lb

## 2021-10-02 DIAGNOSIS — M25561 Pain in right knee: Secondary | ICD-10-CM | POA: Diagnosis not present

## 2021-10-02 DIAGNOSIS — Z96642 Presence of left artificial hip joint: Secondary | ICD-10-CM

## 2021-10-02 DIAGNOSIS — G8929 Other chronic pain: Secondary | ICD-10-CM

## 2021-10-02 DIAGNOSIS — M1711 Unilateral primary osteoarthritis, right knee: Secondary | ICD-10-CM

## 2021-10-02 MED ORDER — LIDOCAINE HCL 1 % IJ SOLN
3.0000 mL | INTRAMUSCULAR | Status: AC | PRN
  Administered 2021-10-02: 09:00:00 3 mL

## 2021-10-02 MED ORDER — METHYLPREDNISOLONE ACETATE 40 MG/ML IJ SUSP
40.0000 mg | INTRAMUSCULAR | Status: AC | PRN
Start: 2021-10-02 — End: 2021-10-02
  Administered 2021-10-02: 09:00:00 40 mg via INTRA_ARTICULAR

## 2021-10-02 NOTE — Progress Notes (Signed)
Office Visit Note   Patient: [name]           Date of Birth: May 13, 1946           MRN: [id number] Visit Date: 10/02/2021              Requested by: Tammi Sou, MD [address] [place],  [place] [address] PCP: Tammi Sou, MD   Assessment & Plan: Visit Diagnoses:  1. Chronic pain of right knee   2. History of revision of total replacement of left hip joint   3. Unilateral primary osteoarthritis, right knee     Plan: He does have severe end-stage arthritis of his right knee.  I would not recommend a knee replacement given the chronic infection with his left hip and the high likelihood that that could seeded his right knee.  I did provide a steroid injection in his right knee today and felt this was reasonable.  We can always repeat this in about 4 months or so.  From a hip standpoint, I do not need to see him back for a year unless there is issues.  At that visit we will have just a standing low AP pelvis.  Follow-Up Instructions: Return in about 1 year (around 10/02/2022).   Orders:  Orders Placed This Encounter  Procedures   Large Joint Inj   XR HIP UNILAT W OR W/O PELVIS 2-3 VIEWS LEFT   XR Knee 1-2 Views Right   No orders of the defined types were placed in this encounter.     Procedures: Large Joint Inj: R knee on 10/02/2021 8:35 AM Indications: diagnostic evaluation and pain Details: 22 G 1.5 in needle, superolateral approach  Arthrogram: No  Medications: 3 mL lidocaine 1 %; 40 mg methylPREDNISolone acetate 40 MG/ML Outcome: tolerated well, no immediate complications Procedure, treatment alternatives, risks and benefits explained, specific risks discussed. Consent was given by the patient. Immediately prior to procedure a time out was called to verify the correct patient, procedure, equipment, support staff and site/side marked as required. Patient was prepped and draped in the usual sterile fashion.      Clinical Data: No additional  findings.   Subjective: Chief Complaint  Patient presents with   Left Hip - Routine Post Op   Right Knee - Pain  The patient is well-known to me.  He has a history of a chronic left hip infection that is being suppressed with antibiotics.  He has had multiple surgeries and revision arthroplasties of the left hip.  He does ambulate with a walking stick.  He says the hip is doing well.  His right knee has been very painful to him with mobility.  It hurts globally with a lot of grinding in the knee as well.  He would like this assessed today as well.  He denies any acute change in medical status.  HPI  Review of Systems   Objective: Vital Signs: Ht 6' 0.25" (1.835 m)    Wt 263 lb (119.3 kg)    BMI 35.42 kg/m   Physical Exam He is alert and orient x3 and in no acute distress Ortho Exam Examination of his left hip shows it moves smoothly and fluidly with no pain.  Examination of the right knee shows global tenderness with patellofemoral crepitation and varus malalignment.  There is pain throughout the arc of motion of the right knee. Specialty Comments:  No specialty comments available.  Imaging: XR HIP UNILAT W  OR W/O PELVIS 2-3 VIEWS LEFT  Result Date: 10/02/2021 An AP pelvis and lateral of the left hip and compared to previous films shows no change in position of her previous revision hip arthroplasty on the left side.  XR Knee 1-2 Views Right  Result Date: 10/02/2021 2 views of the right knee shows tricompartment arthritis with bone-on-bone wear of all 3 compartments.  There is osteophytes in all 3 compartments with varus malalignment.    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  Dislocated posteriorly 2015, ? partial RC tear, has f/u planned with Dr. Marlou Sa--? surgery? possible plan for 2016.   Blood donor, whole blood    Has donated approx 30 gallons in his lifetime   Chronic left hip pain    Severe DJD.  THA 2008.  Left acetabular revision in 2017 AND 2019.   Chronic pain syndrome    Candidate for spinal cord stimulator (03/2017)   Chronic renal insufficiency, stage II (mild)    Chronic venous insufficiency    +compression hose   DDD (degenerative disc disease), lumbar 2017   Dr. Noe Gens ortho.  He is now wearing a new leg brace.  Has had ESI and will get another.   Diverticulosis    +redundant colon   DVT (deep venous thrombosis) ([place]) 06/14/2016   provoked, bilateral LL's; in post-op setting s/p hip surgery.  Xarelto x 87mo at full dosing, then 6 more months at 1/2 dosing, then stopped xarelto.     Foot drop, left    s/p hip fracture 1969   Hx of adenomatous colonic polyps    polypectomy 2007; 2012; 2015; 05/2019; recall 26yrs   Hypercholesterolemia 02/2017   Recommended statin 02/10/17 but pt declined, wanted to do TLC trial first. Rec'd statin 04/2020   Osteoarthritis    knees and hips primarily   Post  laminectomy syndrome    improved with spinal cord stim-->stim to be removed summer/fall 2019.   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 Recurrence 2020->started eligard and apalutamide. PSA 0.16 Feb 2020 urol f/u. [name]054 Aug 2021 urol. <0.015 02/2021.   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; 02/2020   MSSA; admitted for I&D 02/11/20->plan for longterm IV abx after   Urge incontinence    vesicare helpful    Family History  Problem Relation Age of Onset   Lung cancer Mother    Heart disease Father    Colon cancer Neg Hx  Esophageal cancer Neg Hx    Rectal cancer Neg Hx    Stomach cancer Neg Hx    Colon polyps Neg Hx     Past Surgical History:  Procedure Laterality Date   ANTERIOR HIP REVISION Left 06/07/2016   Procedure: LEFT ANTERIOR HIP ACETABULAR REVISION;  Surgeon: Mcarthur Rossetti, MD;  [facility];  Service: Orthopedics;  Laterality: Left;   ANTERIOR HIP REVISION Left 10/09/2017   Procedure: Left hip acetabular revision;  Surgeon: Mcarthur Rossetti, MD;  [facility];  Service: Orthopedics;  Laterality: Left;   arthroscopy left shoulder Left 07   bilateral carpal tunnel release  2003   BILATERAL CARPAL TUNNEL RELEASE     COLONOSCOPY  2007;2012;2015;05/2019   04/12/2014 Tubular adenoma x 1; 05/2019 adenomatous polyp; recall 5 yrs.   FRACTURE SURGERY Left    INCISION AND DRAINAGE HIP Left 02/11/2020   Procedure: IRRIGATION AND DEBRIDEMENT LEFT HIP WITH ANTIBIOTIC SPACER PLACEMENT;  Surgeon: Mcarthur Rossetti, MD;  [facility];  Service: Orthopedics;  Laterality: Left;   JOINT REPLACEMENT Left    hip   LUMBAR LAMINECTOMY/DECOMPRESSION MICRODISCECTOMY Left 03/28/2016   Procedure: Left Lumbar three-four Laminoforaminotomy;  Surgeon: Erline Levine, MD;  Location: [place] [facility];  Service: Neurosurgery;  Laterality: Left;  left   POLYPECTOMY     RADIOACTIVE SEED IMPLANT  2012    ROTATOR CUFF REPAIR  2006   right   seed implant prostate  2012   SHOULDER ARTHROSCOPY Left    SPINAL CORD STIMULATOR IMPLANT  06/2017   Very helpful   TOTAL HIP ARTHROPLASTY  2008   left (cobalt chrome)   VASECTOMY     Social History   Occupational History   Not on file  Tobacco Use   Smoking status: Never   Smokeless tobacco: Never  Vaping Use   Vaping Use: Never used  Substance and Sexual Activity   Alcohol use: No   Drug use: No   Sexual activity: Not on file

## 2021-10-09 ENCOUNTER — Ambulatory Visit (HOSPITAL_COMMUNITY)
Admission: RE | Admit: 2021-10-09 | Discharge: 2021-10-09 | Disposition: A | Discharge status: Home / Self Care | Payer: PPO | Source: Ambulatory Visit | Attending: Urology | Admitting: Urology

## 2021-10-09 ENCOUNTER — Other Ambulatory Visit: Payer: Self-pay

## 2021-10-09 DIAGNOSIS — J9811 Atelectasis: Secondary | ICD-10-CM | POA: Diagnosis not present

## 2021-10-09 DIAGNOSIS — C61 Malignant neoplasm of prostate: Secondary | ICD-10-CM | POA: Insufficient documentation

## 2021-10-09 DIAGNOSIS — I2510 Atherosclerotic heart disease of native coronary artery without angina pectoris: Secondary | ICD-10-CM | POA: Diagnosis not present

## 2021-10-09 DIAGNOSIS — I700 Atherosclerosis of aorta: Secondary | ICD-10-CM | POA: Diagnosis not present

## 2021-10-09 MED ORDER — PIFLIFOLASTAT F 18 (PYLARIFY) INJECTION
9.0000 | Freq: Once | INTRAVENOUS | Status: AC
  Administered 2021-10-09: 9.7 via INTRAVENOUS

## 2021-12-10 IMAGING — MR MR HIP*L* W/O CM
4 of 7 series · 16 of 40 positions shown · non-contrast
Comparison: Radiographs dated 01/11/2020 and 10/18/2019 and hip MRI
dated 10/21/2016

CLINICAL DATA: Chronic persistent left hip pain. Three prior left
hip replacements.

EXAM:
MR OF THE LEFT HIP WITHOUT CONTRAST
TECHNIQUE: Multiplanar, multisequence MR imaging was performed. No intravenous
contrast was administered.

[Series 7: T2 fat-sat · coronal · left · 3.0mm · 0.94mm/px · 3 of 35 slices shown (1 of 3)]
[im 1/35]
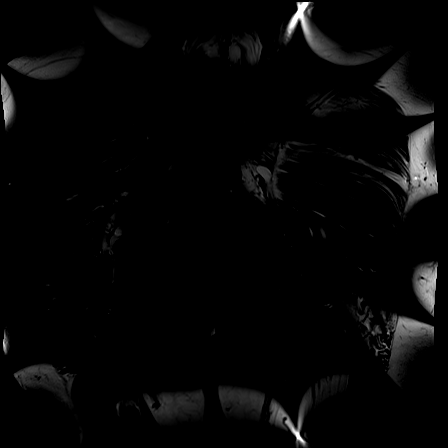
[im 18/35]
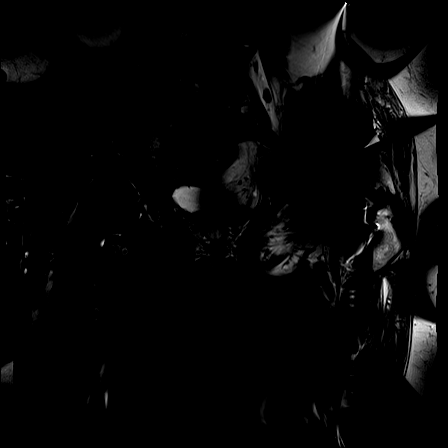
[im 35/35]
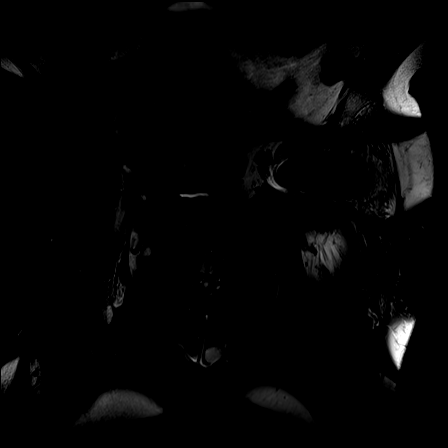

[Series 9: T1 · coronal · left · 3.0mm · 0.94mm/px · 3 of 35 slices shown]
[im 1/35]
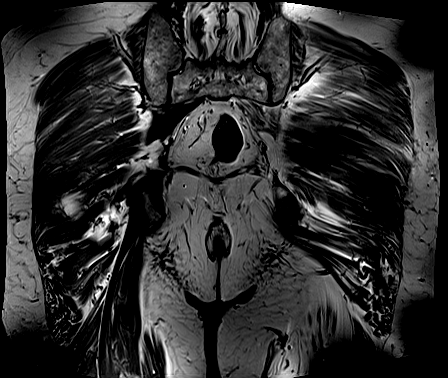
[im 23/35]
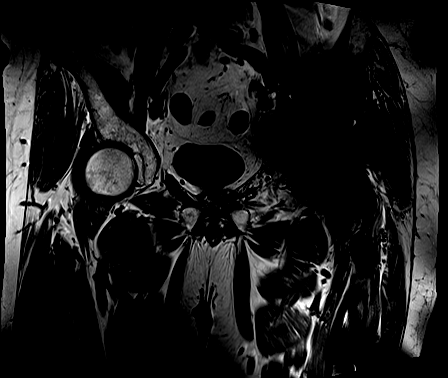
[im 35/35]
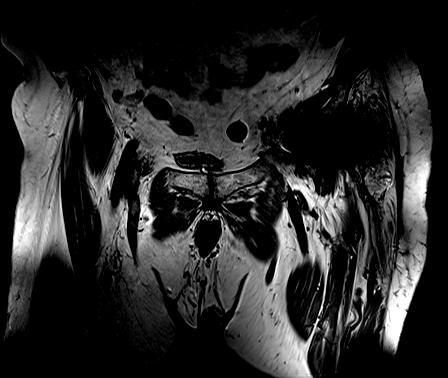

[Series 10: T2 fat-sat · axial · left · 4.0mm · 0.66mm/px · z∈[-174,+134]mm · 7 of 76 slices shown (2 of 3)]
[im 1/76]
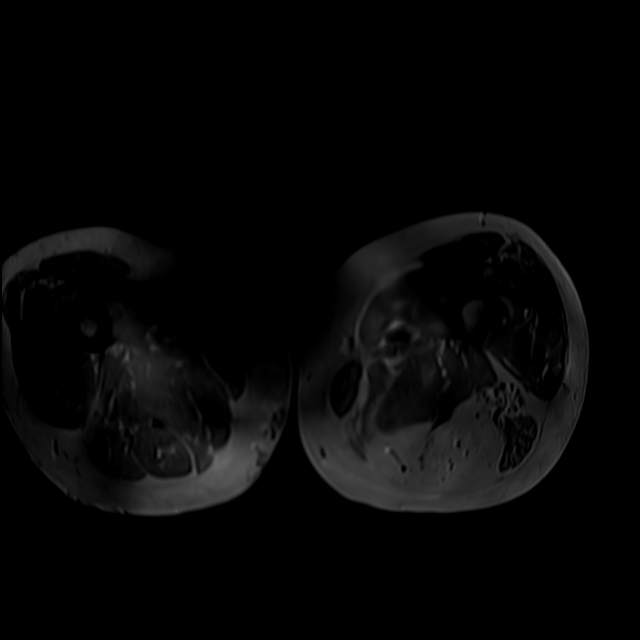
[im 11/76]
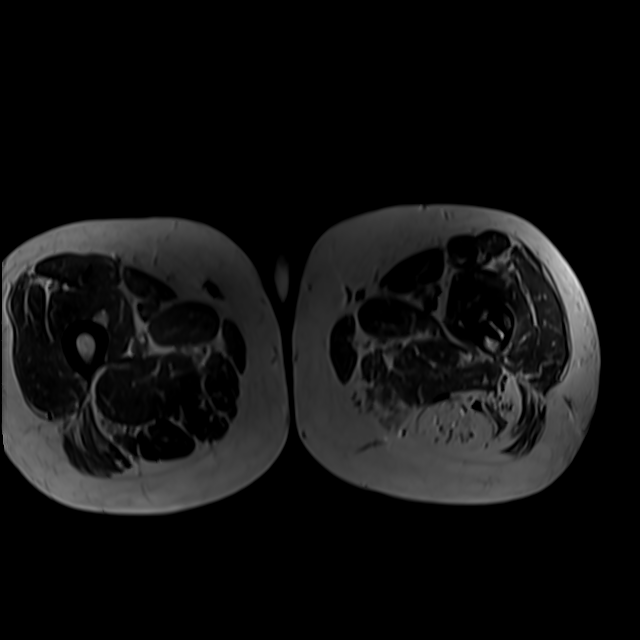
[im 22/76]
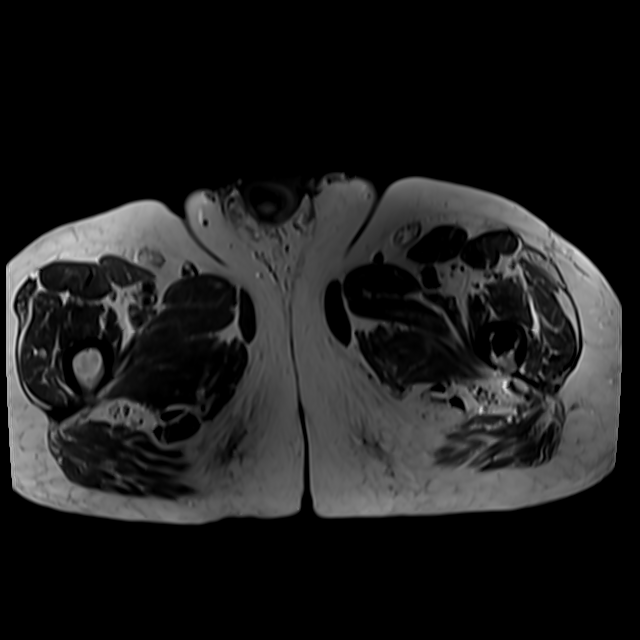
[im 33/76]
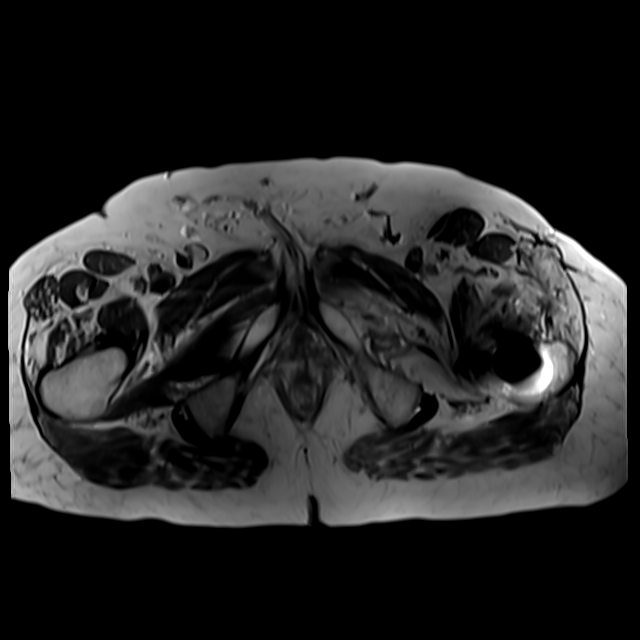
[im 43/76]
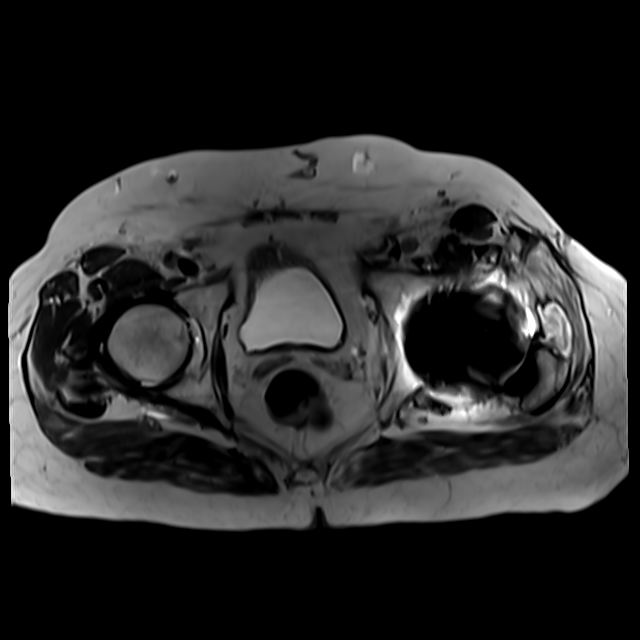
[im 54/76]
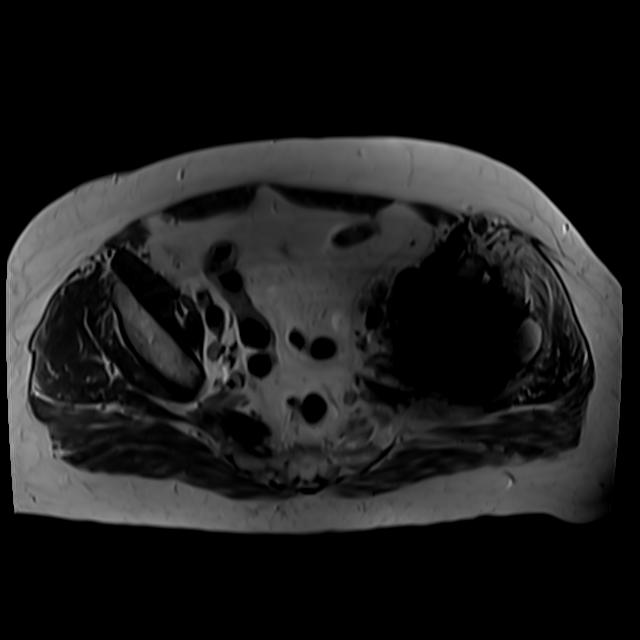
[im 65/76]
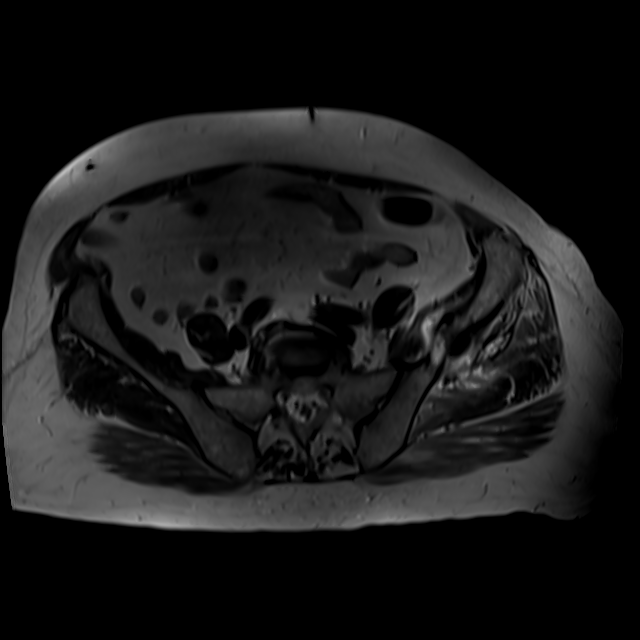

[Series 11: T2 fat-sat · sagittal · left · 3.0mm · 0.66mm/px · 3 of 54 slices shown (3 of 3)]
[im 11/54]
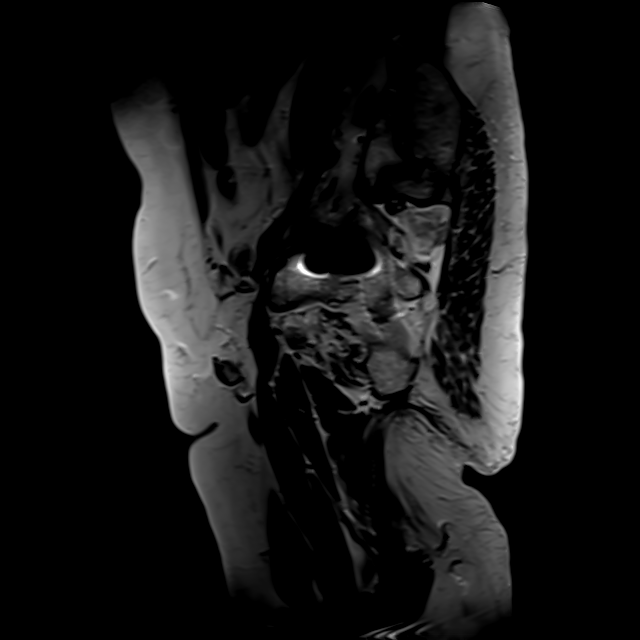
[im 32/54]
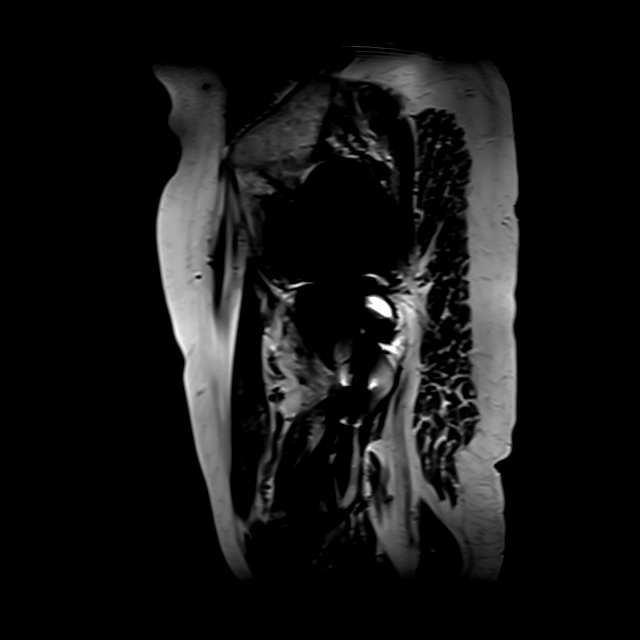
[im 54/54]
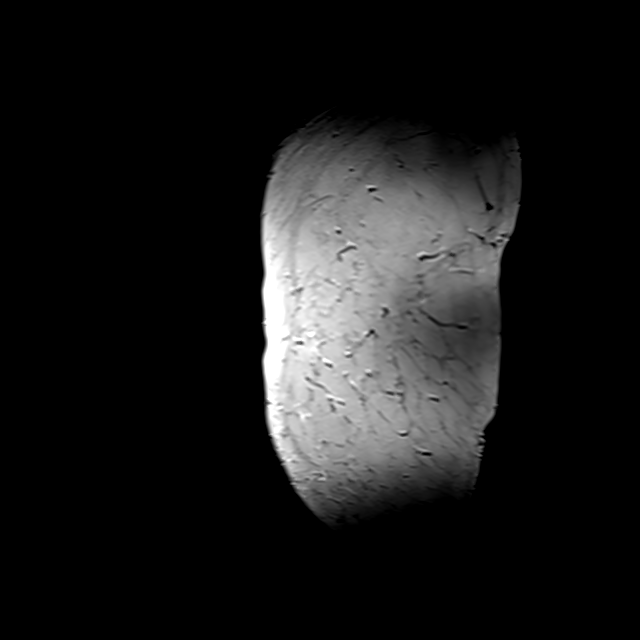

[16 of 40 positions shown; findings below may reference images not displayed]

FINDINGS: Bones: No acute bone abnormalities. Bone detail of the left hip is
obscured by artifact from the hip prosthesis.

Joint or bursal effusion

Joint effusion and bursae: There is a prominent left hip effusion
which extends into the left greater trochanteric bursa and extends
superiorly between the left gluteus minimus and medius muscles. New
fluid in the left iliopsoas bursa which may originate from the
joint. Fluid also extends anteriorly along the left femoral shaft
for several cm.

These findings are new since the prior study of 10/21/2016.

Muscles and tendons

Muscles and tendons: There is chronic atrophy of several muscles
around the left hip as described on the prior study. This appears
unchanged.

Other findings

Miscellaneous: There is an chronic abnormal appearance of the left
sciatic nerve with fatty infiltration and enlargement of the nerve
in the pelvis extending through the sciatic notch. The left sciatic
nerve is no longer on large distal to the sciatic notch, as it was
on the prior study.
IMPRESSION: 1. New prominent left hip effusion extending into the left greater
trochanteric bursa and between the left gluteus minimus and medius
muscles. New left iliopsoas bursitis, possibly representing
extension of the joint effusion.
2. Chronic atrophy of several muscles around the left hip.
3. Chronic abnormal appearance of the left sciatic nerve with fatty
infiltration and enlargement of the nerve in the pelvis extending
through the sciatic notch.

## 2022-01-03 ENCOUNTER — Ambulatory Visit (INDEPENDENT_AMBULATORY_CARE_PROVIDER_SITE_OTHER): Payer: PPO

## 2022-01-03 DIAGNOSIS — Z0000 Encounter for general adult medical examination without abnormal findings: Secondary | ICD-10-CM

## 2022-01-03 NOTE — Progress Notes (Signed)
? ?Subjective:  ? [name] is a 76 y.o. male who presents for Medicare Annual/Subsequent preventive examination. I connected with  [name] on 01/03/22 by a audio enabled telemedicine application and verified that I am speaking with the correct person using two identifiers. ? ?Patient Location: Home ? ?Provider Location: Office/Clinic ? ?I discussed the limitations of evaluation and management by telemedicine. The patient expressed understanding and agreed to proceed.  ? ?Review of Systems    ?Defer to PCP ?Cardiac Risk Factors include: advanced age (>52mn, >>73women);male gender;obesity (BMI >30kg/m2);sedentary lifestyle ? ?   ?Objective:  ?  ?Today's Vitals  ? 01/03/22 1504  ?PainSc: 3   ? ?There is no height or weight on file to calculate BMI. ? ? ?  01/03/2022  ?  3:08 PM 02/11/2020  ? 12:13 PM 02/08/2020  ?  1:52 PM 10/09/2017  ?  2:30 PM 10/06/2017  ?  8:23 AM 01/09/2017  ? 11:43 AM 06/07/2016  ?  6:05 PM  ?Advanced Directives  ?Does Patient Have a Medical Advance Directive? Yes Yes Yes Yes Yes Yes Yes  ?Type of AParamedicof AKingsburyLiving will HFair OaksLiving will HLas PalomasLiving will Living will Living will Living will;Healthcare Power of ALansdowneLiving will  ?Does patient want to make changes to medical advance directive? No - Patient declined No - Patient declined  No - Patient declined No - Patient declined  No - Patient declined  ?Copy of HRoslynin Chart? No - copy requested No - copy requested    No - copy requested Yes  ? ? ?Current Medications (verified) ?Outpatient Encounter Medications as of 01/03/2022  ?Medication Sig  ? acetaminophen (TYLENOL) 500 MG tablet Take 1,000 mg by mouth 2 (two) times daily as needed for moderate pain or headache.   ? aspirin EC 81 MG tablet Take 81 mg by mouth daily.  ? atorvastatin (LIPITOR) 20 MG tablet Take 1 tablet (20 mg total) by mouth daily.   ? Calcium Carb-Cholecalciferol (CALCIUM + D3 PO) Take 1 tablet by mouth daily.  ? cephALEXin (KEFLEX) 500 MG capsule Take 1 capsule (500 mg total) by mouth 2 (two) times daily.  ? ERLEADA 60 MG tablet Take 240 mg by mouth daily.   ? solifenacin (VESICARE) 5 MG tablet Take 5 mg by mouth daily.  ? ?No facility-administered encounter medications on file as of 01/03/2022.  ? ? ?Allergies (verified) ?Adhesive [tape]  ? ?History: ?Past Medical History:  ?Diagnosis Date  ? Anterior dislocation of right shoulder 09/2013  ? Reduced in ED under sedation.  Dislocated posteriorly 2015, ? partial RC tear, has f/u planned with Dr. DMarlou Sa-? surgery? possible plan for 2016.  ? Blood donor, whole blood   ? Has donated approx 30 gallons in his lifetime  ? Chronic left hip pain   ? Severe DJD.  THA 2008.  Left acetabular revision in 2017 AND 2019.  ? Chronic pain syndrome   ? Candidate for spinal cord stimulator (03/2017)  ? Chronic renal insufficiency, stage II (mild)   ? Chronic venous insufficiency   ? +compression hose  ? DDD (degenerative disc disease), lumbar 2017  ? Dr. YNoe Gensortho.  He is now wearing a new leg brace.  Has had ESI and will get another.  ? Diverticulosis   ? +redundant colon  ? D[place]) ([place] 06/[place]  ? provoked, bilateral LL's; in post-op setting s/p hip  surgery.  Xarelto x 58moat full dosing, then 6 more months at 1/2 dosing, then stopped xarelto.    ? Foot drop, left   ? s/p hip fracture 1969  ? Hx of adenomatous colonic polyps   ? polypectomy 2007; 2012; 2015; 05/2019; recall 553yr ? Hypercholesterolemia 02/2017  ? Recommended statin 02/10/17 but pt declined, wanted to do TLC trial first. Rec'd statin 04/2020  ? Osteoarthritis   ? knees and hips primarily  ? Post laminectomy syndrome   ? improved with spinal cord stim-->stim to be removed summer/fall 2019.  ? [place] cancer [place]; [place]  ? 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 PSA 0.16 Feb 2020 urol f/u. .0[name]4 Aug 2021 urol. <0.015 02/2021.  ? Prosthetic hip infection ([facility]; 02/2020  ? MSSA; admitted for I&D 02/11/20->plan for longterm IV abx after  ? Urge incontinence   ? vesicare helpful  ? ?Past Surgical History:  ?Procedure Laterality Date  ? ANTERIOR HIP REVISION Left 06/07/2016  ? Procedure: LEFT ANTERIOR HIP ACETABULAR REVISION;  Surgeon: ChMcarthur RossettiMD;  [facility]: [facility];  Service: Orthopedics;  Laterality: Left;  ? ANTERIOR HIP REVISION Left 10/09/2017  ? Procedure: Left hip acetabular revision;  Surgeon: BlMcarthur RossettiMD;  [facility];  Service: Orthopedics;  Laterality: Left;  ? arthroscopy left shoulder Left 07  ? bilateral carpal tunnel release  2003  ? BILATERAL CARPAL TUNNEL RELEASE    ? COLONOSCOPY  2007;2012;2015;05/2019  ? 04/12/2014 Tubular adenoma x 1; 05/2019 adenomatous polyp; recall 5 yrs.  ? FRACTURE SURGERY Left   ? INCISION AND DRAINAGE HIP Left 02/11/2020  ? Procedure: IRRIGATION AND DEBRIDEMENT LEFT HIP WITH ANTIBIOTIC SPACER PLACEMENT;  Surgeon: BlMcarthur RossettiMD;  [facility];  Service: Orthopedics;  Laterality: Left;  ? JOINT REPLACEMENT Left   ? hip  ? LUMBAR LAMINECTOMY/DECOMPRESSION MICRODISCECTOMY Left 03/28/2016  ? Procedure: Left Lumbar three-four Laminoforaminotomy;  Surgeon: JoErline LevineMD;  [facility];  Service: Neurosurgery;  Laterality: Left;  left  ? POLYPECTOMY    ? RADIOACTIVE SEED IMPLANT  2012  ? ROTATOR CUFF REPAIR  2006  ? right  ? seed implant prostate  2012  ? SHOULDER ARTHROSCOPY Left   ? SPINAL CORD STIMULATOR IMPLANT  06/2017  ? Very helpful  ? TOTAL HIP ARTHROPLASTY  2008  ? left (cobalt chrome)  ? VASECTOMY    ? ?Family History  ?Problem Relation Age of Onset  ? Lung cancer Mother   ? Heart disease Father   ? Colon cancer Neg Hx   ? Esophageal cancer Neg Hx   ? Rectal cancer Neg Hx   ? Stomach cancer Neg Hx   ? Colon polyps Neg Hx    ? ?Social History  ? ?Socioeconomic History  ? Marital status: Married  ?  Spouse name: Not on file  ? Number of children: Not on file  ? Years of education: Not on file  ? Highest education level: Not on file  ?Occupational History  ? Not on file  ?Tobacco Use  ? Smoking status: Never  ? Smokeless tobacco: Never  ?Vaping Use  ? Vaping Use: Never used  ?Substance and Sexual Activity  ? Alcohol use: No  ? Drug use: No  ? Sexual activity: Not on file  ?Other Topics Concern  ? Not on file  ?Social History Narrative  ? Married, 2 biologic children, 2  adopted, 5 GC.  ? Lives in [place].  Worked 35 yrs at [employer].  ? 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  ? No formal exercise.  No T/A/Ds.  ? Enjoys woodworking.  ? ?Social Determinants of Health  ? ?Financial Resource Strain: Low Risk   ? Difficulty of Paying Living Expenses: Not very hard  ?Food Insecurity: No Food Insecurity  ? Worried About Charity fundraiser in the Last Year: Never true  ? Ran Out of Food in the Last Year: Never true  ?Transportation Needs: No Transportation Needs  ? Lack of Transportation (Medical): No  ? Lack of Transportation (Non-Medical): No  ?Physical Activity: Insufficiently Active  ? Days of Exercise per Week: 7 days  ? Minutes of Exercise per Session: 20 min  ?Stress: No Stress Concern Present  ? Feeling of Stress : Not at all  ?Social Connections: Socially Integrated  ? Frequency of Communication with Friends and Family: More than three times a week  ? Frequency of Social Gatherings with Friends and Family: More than three times a week  ? Attends Religious Services: More than 4 times per year  ? Active Member of Clubs or Organizations: Yes  ? Attends Archivist Meetings: More than 4 times per year  ? Marital Status: Married  ? ? ?Tobacco Counseling ?Counseling given: Not Answered ? ? ?Clinical Intake: ? ?Pre-visit preparation completed: Yes ? ?Pain : 0-10 ?Pain Score: 3  ?Pain Type: Chronic pain ?Pain  Location: Hip ?Pain Orientation: Left ? ?  ? ?BMI - recorded: 35 ?Nutritional Risks: None ?Diabetes: No ? ?How often do you need to have someone help you when you read instructions, pamphlets, or other written m

## 2022-02-11 ENCOUNTER — Telehealth: Payer: Self-pay

## 2022-02-11 NOTE — Telephone Encounter (Signed)
Type of forms received:  [place] DMV ? ?Routed to:  [employer] [place] ? ?Paperwork received by :  Hinton Dyer ? ? ?Individual made aware of 3-5 business day turn around (Y/N): Yes ? ? ?Form completed and patient made aware of charges(Y/N):  ?N/A ? ?Faxed to :  N/A ? ?Form location:  [place] in box 5/8 ? ? ?Patient made aware Dr. Anitra Lauth is out of office, returning on 02/20/22.  Please call patient when ready for pick up. Patient stated no hurry, current placard expires in August. ?

## 2022-02-12 NOTE — Telephone Encounter (Signed)
Form completed and placed in cma basket for PCP signature ?

## 2022-03-13 DIAGNOSIS — C61 Malignant neoplasm of prostate: Secondary | ICD-10-CM | POA: Diagnosis not present

## 2022-03-13 DIAGNOSIS — C775 Secondary and unspecified malignant neoplasm of intrapelvic lymph nodes: Secondary | ICD-10-CM | POA: Diagnosis not present

## 2022-03-13 LAB — TESTOSTERONE: Testosterone: 16.8

## 2022-03-13 LAB — PSA: PSA: <0.015

## 2022-05-02 ENCOUNTER — Ambulatory Visit (INDEPENDENT_AMBULATORY_CARE_PROVIDER_SITE_OTHER): Payer: PPO | Admitting: Family Medicine

## 2022-05-02 ENCOUNTER — Encounter: Payer: Self-pay | Admitting: Family Medicine

## 2022-05-02 VITALS — BP 130/70 | HR 60 | Temp 97.5°F | Ht 72.5 in | Wt 257.2 lb

## 2022-05-02 DIAGNOSIS — Z0000 Encounter for general adult medical examination without abnormal findings: Secondary | ICD-10-CM

## 2022-05-02 DIAGNOSIS — E7800 Pure hypercholesterolemia, unspecified: Secondary | ICD-10-CM | POA: Diagnosis not present

## 2022-05-02 LAB — COMPREHENSIVE METABOLIC PANEL
ALT: 7 U/L (ref 0–53)
AST: 11 U/L (ref 0–37)
Albumin: 3.7 g/dL (ref 3.5–5.2)
Alkaline Phosphatase: 92 U/L (ref 39–117)
BUN: 17 mg/dL (ref 6–23)
CO2: 26 mEq/L (ref 19–32)
Calcium: 10.2 mg/dL (ref 8.4–10.5)
Chloride: 104 mEq/L (ref 96–112)
Creatinine, Ser: 1.07 mg/dL (ref 0.40–1.50)
GFR: 67.04 mL/min (ref >60.00)
Glucose, Bld: 102 mg/dL — ABNORMAL HIGH (ref 70–99)
Potassium: 4.6 mEq/L (ref 3.5–5.1)
Sodium: 137 mEq/L (ref 135–145)
Total Bilirubin: 0.4 mg/dL (ref 0.2–1.2)
Total Protein: 6.7 g/dL (ref 6.0–8.3)

## 2022-05-02 LAB — CBC
HCT: 44.1 % (ref 39.0–52.0)
Hemoglobin: 14.7 g/dL (ref 13.0–17.0)
MCHC: 33.4 g/dL (ref 30.0–36.0)
MCV: 92 fl (ref 78.0–100.0)
Platelets: 247 10*3/uL (ref 150.0–400.0)
RBC: 4.79 Mil/uL (ref 4.22–5.81)
RDW: 14.6 % (ref 11.5–15.5)
WBC: 5.6 10*3/uL (ref 4.0–10.5)

## 2022-05-02 LAB — LIPID PANEL
Cholesterol: 135 mg/dL (ref 0–200)
HDL: 41.7 mg/dL (ref >39.00)
LDL Cholesterol: 76 mg/dL (ref 0–99)
NonHDL: 92.9
Total CHOL/HDL Ratio: 3
Triglycerides: 86 mg/dL (ref 0.0–149.0)
VLDL: 17.2 mg/dL (ref 0.0–40.0)

## 2022-05-02 MED ORDER — ATORVASTATIN CALCIUM 20 MG PO TABS
20.0000 mg | ORAL_TABLET | Freq: Every day | ORAL | 3 refills | Status: DC
Start: 2022-05-02 — End: 2023-05-05

## 2022-05-02 NOTE — Progress Notes (Signed)
Office Note 05/02/2022  CC:  Chief Complaint  Patient presents with   Annual Exam    Pt is not fasting; had cereal 3 hours ago   HPI:  Patient is a 76 y.o. male who is here for annual health maintenance exam. [name] is feeling well. He remains active with yard work, activities with his family. He eats healthy.  Past Medical History:  Diagnosis Date   Anterior dislocation of right shoulder 09/2013   Reduced in ED under sedation.  Dislocated posteriorly 2015, ? partial RC tear, has f/u planned with Dr. Marlou Sa--? surgery? possible plan for 2016.   Blood donor, whole blood    Has donated approx 30 gallons in his lifetime   Chronic left hip pain    Severe DJD.  THA 2008.  Left acetabular revision in 2017 AND 2019.   Chronic pain syndrome    Candidate for spinal cord stimulator (03/2017)   Chronic renal insufficiency, stage II (mild)    Chronic venous insufficiency    +compression hose   DDD (degenerative disc disease), lumbar 2017   Dr. Noe Gens ortho.  He is now wearing a new leg brace.  Has had ESI and will get another.   Diverticulosis    +redundant colon   DVT (deep venous thrombosis) ([place]) 06/14/2016   provoked, bilateral LL's; in post-op setting s/p hip surgery.  Xarelto x 75moat full dosing, then 6 more months at 1/2 dosing, then stopped xarelto.     Foot drop, left    s/p hip fracture 1969   Hx of adenomatous colonic polyps    polypectomy 2007; 2012; 2015; 05/2019; recall 512yr  Hypercholesterolemia 02/2017   Recommended statin 02/10/17 but pt declined, wanted to do TLC trial first. Rec'd statin 04/2020   Osteoarthritis    knees and hips primarily   Post laminectomy syndrome    improved with spinal cord stim-->stim to be removed summer/fall 2019.   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 Recurrence 2020->started eligard and apalutamide. PSA 0.16 Feb 2020 urol f/u. .0[name]4 Aug 2021  urol. <0.015 02/2021.   Prosthetic hip infection ([place]01/2019; 02/2020   MSSA; admitted for I&D 02/11/20->plan for longterm IV abx after   Urge incontinence    vesicare helpful    Past Surgical History:  Procedure Laterality Date   ANTERIOR HIP REVISION Left 06/07/2016   Procedure: LEFT ANTERIOR HIP ACETABULAR REVISION;  Surgeon: ChMcarthur RossettiMD;  [facility];  Service: Orthopedics;  Laterality: Left;   ANTERIOR HIP REVISION Left 10/09/2017   Procedure: Left hip acetabular revision;  Surgeon: BlMcarthur RossettiMD;  [facility];  Service: Orthopedics;  Laterality: Left;   arthroscopy left shoulder Left 07   bilateral carpal tunnel release  2003   BILATERAL CARPAL TUNNEL RELEASE     COLONOSCOPY  2007;2012;2015;05/2019   04/12/2014 Tubular adenoma x 1; 05/2019 adenomatous polyp; recall 5 yrs.   FRACTURE SURGERY Left    INCISION AND DRAINAGE HIP Left 02/11/2020   Procedure: IRRIGATION AND DEBRIDEMENT LEFT HIP WITH ANTIBIOTIC SPACER PLACEMENT;  Surgeon: BlMcarthur RossettiMD;  [facility];  Service: Orthopedics;  Laterality: Left;   JOINT REPLACEMENT Left    hip   LUMBAR LAMINECTOMY/DECOMPRESSION MICRODISCECTOMY Left 03/28/2016   Procedure: Left Lumbar three-four Laminoforaminotomy;  Surgeon: JoErline LevineMD;  [facility];  Service: Neurosurgery;  Laterality: Left;  left   POLYPECTOMY  RADIOACTIVE SEED IMPLANT  2012   ROTATOR CUFF REPAIR  2006   right   seed implant prostate  2012   SHOULDER ARTHROSCOPY Left    SPINAL CORD STIMULATOR IMPLANT  06/2017   Very helpful   TOTAL HIP ARTHROPLASTY  2008   left (cobalt chrome)   VASECTOMY      Family History  Problem Relation Age of Onset   Lung cancer Mother    Heart disease Father    Colon cancer Neg Hx    Esophageal cancer Neg Hx    Rectal cancer Neg Hx    Stomach cancer Neg Hx    Colon polyps Neg Hx     Social History   Socioeconomic History   Marital status: Married    Spouse name:  Not on file   Number of children: Not on file   Years of education: Not on file   Highest education level: Not on file  Occupational History   Not on file  Tobacco Use   Smoking status: Never   Smokeless tobacco: Never  Vaping Use   Vaping Use: Never used  Substance and Sexual Activity   Alcohol use: No   Drug use: No   Sexual activity: Not on file  Other Topics Concern   Not on file  Social History Narrative   Married, 2 biologic children, 2 adopted, 5 GC.   Lives in [place].  Worked 35 yrs at [employer].   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   No formal exercise.  No T/A/Ds.   Enjoys woodworking.   Social Determinants of Health   Financial Resource Strain: Low Risk  (01/03/2022)   Overall Financial Resource Strain (CARDIA)    Difficulty of Paying Living Expenses: Not very hard  Food Insecurity: No Food Insecurity (01/03/2022)   Hunger Vital Sign    Worried About Running Out of Food in the Last Year: Never true    Ran Out of Food in the Last Year: Never true  Transportation Needs: No Transportation Needs (01/03/2022)   PRAPARE - Hydrologist (Medical): No    Lack of Transportation (Non-Medical): No  Physical Activity: Insufficiently Active (01/03/2022)   Exercise Vital Sign    Days of Exercise per Week: 7 days    Minutes of Exercise per Session: 20 min  Stress: No Stress Concern Present (01/03/2022)   [place]    Feeling of Stress : Not at all  Social Connections: [place] (01/03/2022)   Social Connection and Isolation Panel [NHANES]    Frequency of Communication with Friends and Family: More than three times a week    Frequency of Social Gatherings with Friends and Family: More than three times a week    Attends Religious Services: More than 4 times per year    Active Member of [employer] or Organizations: Yes    Attends Music therapist:  More than 4 times per year    Marital Status: Married  Human resources officer Violence: Not At Risk (01/03/2022)   Humiliation, Afraid, Rape, and Kick questionnaire    Fear of Current or Ex-Partner: No    Emotionally Abused: No    Physically Abused: No    Sexually Abused: No    Outpatient Medications Prior to Visit  Medication Sig Dispense Refill   acetaminophen (TYLENOL) 500 MG tablet Take 1,000 mg by mouth 2 (two) times daily as needed for moderate pain  or headache.      aspirin EC 81 MG tablet Take 81 mg by mouth daily.     atorvastatin (LIPITOR) 20 MG tablet Take 1 tablet (20 mg total) by mouth daily. 90 tablet 3   Calcium Carb-Cholecalciferol (CALCIUM + D3 PO) Take 1 tablet by mouth daily.     cephALEXin (KEFLEX) 500 MG capsule Take 1 capsule (500 mg total) by mouth 2 (two) times daily. 60 capsule 11   ERLEADA 60 MG tablet Take 240 mg by mouth daily.      solifenacin (VESICARE) 5 MG tablet Take 5 mg by mouth daily.     No facility-administered medications prior to visit.    Allergies  Allergen Reactions   Adhesive [Tape] Rash    Paper tape is ok    ROS Review of Systems  Constitutional:  Negative for appetite change, chills, fatigue and fever.  HENT:  Negative for congestion, dental problem, ear pain and sore throat.   Eyes:  Negative for discharge, redness and visual disturbance.  Respiratory:  Negative for cough, chest tightness, shortness of breath and wheezing.   Cardiovascular:  Negative for chest pain, palpitations and leg swelling.  Gastrointestinal:  Negative for abdominal pain, blood in stool, diarrhea, nausea and vomiting.  Genitourinary:  Negative for difficulty urinating, dysuria, flank pain, frequency, hematuria and urgency.  Musculoskeletal:  Negative for arthralgias, back pain, joint swelling, myalgias and neck stiffness.  Skin:  Negative for pallor and rash.  Neurological:  Negative for dizziness, speech difficulty, weakness and headaches.  Hematological:   Negative for adenopathy. Does not bruise/bleed easily.  Psychiatric/Behavioral:  Negative for confusion and sleep disturbance. The patient is not nervous/anxious.     PE;    05/02/2022    8:55 AM 10/02/2021    8:28 AM 05/28/2021    9:07 AM  Vitals with BMI  Height 6' 0.5" 6' 0.25" 6' 0.25"  Weight 257 lbs 3 oz 263 lbs 263 lbs 3 oz  BMI 34.38 16.10 96.04  Systolic 540  981  Diastolic 70  72  Pulse 60  52     Gen: Alert, well appearing.  Patient is oriented to person, place, time, and situation. AFFECT: pleasant, lucid thought and speech. ENT: Ears: EACs clear, normal epithelium.  TMs with good light reflex and landmarks bilaterally.  Eyes: no injection, icteris, swelling, or exudate.  EOMI, PERRLA. Nose: no drainage or turbinate edema/swelling.  No injection or focal lesion.  Mouth: lips without lesion/swelling.  Oral mucosa pink and moist.  Dentition intact and without obvious caries or gingival swelling.  Oropharynx without erythema, exudate, or swelling.  Neck: supple/nontender.  No LAD, mass, or TM.  Carotid pulses 2+ bilaterally, without bruits. CV: RRR, no m/r/g.   LUNGS: CTA bilat, nonlabored resps, good aeration in all lung fields. ABD: soft, NT, ND, BS normal.  No hepatospenomegaly or mass.  No bruits. EXT: no clubbing, cyanosis, or edema.  Musculoskeletal: no joint swelling, erythema, warmth, or tenderness.  ROM of all joints intact. Skin - no sores or suspicious lesions or rashes or color changes  Pertinent labs:  Lab Results  Component Value Date   TSH 2.07 02/10/2017   Lab Results  Component Value Date   WBC 6.0 04/30/2021   HGB 15.3 04/30/2021   HCT 46.4 04/30/2021   MCV 93.1 04/30/2021   PLT 233.0 04/30/2021   Lab Results  Component Value Date   CREATININE 1.03 04/30/2021   BUN 16 04/30/2021   NA 139 04/30/2021  K 4.7 04/30/2021   CL 104 04/30/2021   CO2 27 04/30/2021   Lab Results  Component Value Date   ALT 9 04/30/2021   AST 12 04/30/2021    ALKPHOS 101 04/30/2021   BILITOT 0.5 04/30/2021   Lab Results  Component Value Date   CHOL 140 04/30/2021   Lab Results  Component Value Date   HDL 41.80 04/30/2021   Lab Results  Component Value Date   LDLCALC 81 04/30/2021   Lab Results  Component Value Date   TRIG 83.0 04/30/2021   Lab Results  Component Value Date   CHOLHDL 3 04/30/2021   Lab Results  Component Value Date   PSA <0.015 06/06/2021   PSA <0.015 02/19/2021   PSA 0.034 08/21/2020   Lab Results  Component Value Date   HGBA1C 5.4 05/27/2013   ASSESSMENT AND PLAN:   1) Health maintenance exam: Reviewed age and gender appropriate health maintenance issues (prudent diet, regular exercise, health risks of tobacco and excessive alcohol, use of seatbelts, fire alarms in home, use of sunscreen).  Also reviewed age and gender appropriate health screening as well as vaccine recommendations. Vaccines: ALL UTD. Labs:  cbc,cmet, lipids Prostate ca screening: hx of prostate ca and is followed by urol. Colon ca screening: next colonoscopy due 2025  2) Hypercholesterolemia: Tolerating a atorvastatin 20 mg a day. Lipid panel and hepatic panel today.  An After Visit Summary was printed and given to the patient.  FOLLOW UP:  No follow-ups on file.  Signed:  Crissie Sickles, MD           05/02/2022

## 2022-05-02 NOTE — Patient Instructions (Signed)
Health Maintenance, Male Adopting a healthy lifestyle and getting preventive care are important in promoting health and wellness. Ask your health care provider about: The right schedule for you to have regular tests and exams. Things you can do on your own to prevent diseases and keep yourself healthy. What should I know about diet, weight, and exercise? Eat a healthy diet  Eat a diet that includes plenty of vegetables, fruits, low-fat dairy products, and lean protein. Do not eat a lot of foods that are high in solid fats, added sugars, or sodium. Maintain a healthy weight Body mass index (BMI) is a measurement that can be used to identify possible weight problems. It estimates body fat based on height and weight. Your health care provider can help determine your BMI and help you achieve or maintain a healthy weight. Get regular exercise Get regular exercise. This is one of the most important things you can do for your health. Most adults should: Exercise for at least 150 minutes each week. The exercise should increase your heart rate and make you sweat (moderate-intensity exercise). Do strengthening exercises at least twice a week. This is in addition to the moderate-intensity exercise. Spend less time sitting. Even light physical activity can be beneficial. Watch cholesterol and blood lipids Have your blood tested for lipids and cholesterol at 77 years of age, then have this test every 5 years. You may need to have your cholesterol levels checked more often if: Your lipid or cholesterol levels are high. You are older than 77 years of age. You are at high risk for heart disease. What should I know about cancer screening? Many types of cancers can be detected early and may often be prevented. Depending on your health history and family history, you may need to have cancer screening at various ages. This may include screening for: Colorectal cancer. Prostate cancer. Skin cancer. Lung  cancer. What should I know about heart disease, diabetes, and high blood pressure? Blood pressure and heart disease High blood pressure causes heart disease and increases the risk of stroke. This is more likely to develop in people who have high blood pressure readings or are overweight. Talk with your health care provider about your target blood pressure readings. Have your blood pressure checked: Every 3-5 years if you are 18-39 years of age. Every year if you are 40 years old or older. If you are between the ages of 65 and 75 and are a current or former smoker, ask your health care provider if you should have a one-time screening for abdominal aortic aneurysm (AAA). Diabetes Have regular diabetes screenings. This checks your fasting blood sugar level. Have the screening done: Once every three years after age 45 if you are at a normal weight and have a low risk for diabetes. More often and at a younger age if you are overweight or have a high risk for diabetes. What should I know about preventing infection? Hepatitis B If you have a higher risk for hepatitis B, you should be screened for this virus. Talk with your health care provider to find out if you are at risk for hepatitis B infection. Hepatitis C Blood testing is recommended for: Everyone born from 1945 through 1965. Anyone with known risk factors for hepatitis C. Sexually transmitted infections (STIs) You should be screened each year for STIs, including gonorrhea and chlamydia, if: You are sexually active and are younger than 77 years of age. You are older than 77 years of age and your   health care provider tells you that you are at risk for this type of infection. Your sexual activity has changed since you were last screened, and you are at increased risk for chlamydia or gonorrhea. Ask your health care provider if you are at risk. Ask your health care provider about whether you are at high risk for HIV. Your health care provider  may recommend a prescription medicine to help prevent HIV infection. If you choose to take medicine to prevent HIV, you should first get tested for HIV. You should then be tested every 3 months for as long as you are taking the medicine. Follow these instructions at home: Alcohol use Do not drink alcohol if your health care provider tells you not to drink. If you drink alcohol: Limit how much you have to 0-2 drinks a day. Know how much alcohol is in your drink. In the [place] one drink equals one 12 oz bottle of beer (355 mL), one 5 oz glass of wine (148 mL), or one 1 oz glass of hard liquor (44 mL). Lifestyle Do not use any products that contain nicotine or tobacco. These products include cigarettes, chewing tobacco, and vaping devices, such as e-cigarettes. If you need help quitting, ask your health care provider. Do not use street drugs. Do not share needles. Ask your health care provider for help if you need support or information about quitting drugs. General instructions Schedule regular health, dental, and eye exams. Stay current with your vaccines. Tell your health care provider if: You often feel depressed. You have ever been abused or do not feel safe at home. Summary Adopting a healthy lifestyle and getting preventive care are important in promoting health and wellness. Follow your health care provider's instructions about healthy diet, exercising, and getting tested or screened for diseases. Follow your health care provider's instructions on monitoring your cholesterol and blood pressure. This information is not intended to replace advice given to you by your health care provider. Make sure you discuss any questions you have with your health care provider. Document Revised: 02/12/2021 Document Reviewed: 02/12/2021 Elsevier Patient Education  2023 [employer] Inc.  

## 2022-05-08 ENCOUNTER — Encounter: Payer: Self-pay | Admitting: Internal Medicine

## 2022-05-08 ENCOUNTER — Other Ambulatory Visit: Payer: Self-pay

## 2022-05-08 ENCOUNTER — Ambulatory Visit: Payer: PPO | Admitting: Internal Medicine

## 2022-05-08 DIAGNOSIS — T8452XD Infection and inflammatory reaction due to internal left hip prosthesis, subsequent encounter: Secondary | ICD-10-CM | POA: Diagnosis not present

## 2022-05-08 MED ORDER — CEFADROXIL 500 MG PO CAPS: 500.0000 mg | ORAL_CAPSULE | Freq: Two times a day (BID) | ORAL | 11 refills | Status: DC

## 2022-05-08 NOTE — Assessment & Plan Note (Signed)
He prefers to stay on chronic suppressive antibiotic therapy for his previous relapsing MSSA left prosthetic hip infection.  I will change cephalexin to cefadroxil and see him back in 1 year.

## 2022-05-08 NOTE — Progress Notes (Signed)
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   Pain in left hip 09/01/2017   History of revision of total replacement of left hip joint 09/01/2017   Encounter for surveillance of recalled total hip arthroplasty hardware ([place]) 06/07/2016   Status post revision of total hip replacement 06/07/2016   Spinal stenosis, lumbar region, 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  Previous Medications   ACETAMINOPHEN (TYLENOL) 500 MG TABLET    Take 1,000 mg by mouth 2 (two) times daily as needed for moderate pain or headache.    ASPIRIN EC 81 MG TABLET    Take 81 mg by mouth daily.   ATORVASTATIN (LIPITOR) 20 MG TABLET    Take 1 tablet (20 mg total) by mouth daily.   CALCIUM CARB-CHOLECALCIFEROL (CALCIUM + D3 PO)    Take 1 tablet by mouth daily.   ERLEADA 60 MG TABLET    Take 240 mg by mouth daily.    SOLIFENACIN (VESICARE) 5 MG TABLET    Take 5 mg by mouth daily.  Modified Medications   No medications on file  Discontinued Medications   CEPHALEXIN (KEFLEX) 500 MG CAPSULE    Take 1 capsule (500 mg total) by mouth 2 (two) times daily.    Subjective: [name] is in for his routine follow-up visit.  He  underwent left total hip arthroplasty in 2008. He did well until he developed failure of the acetabular component requiring revision in September 2017.  Operative cultures grew MSSA.  He recalls being on a 4-week course of IV antibiotic following surgery.  He did well until late 2018.  He required repeat revision in January of that year and cultures grew MSSA again.  He was treated with a 6-week course of IV antibiotics.  He did well until starting to have more pain last spring. An MRI showed a fluid collection in the left hip.  He was admitted and underwent incision and drainage and antibiotic bead placement on 02/11/2020.  Cultures grew MSSA again.  He completed 6 weeks of IV cefazolin and oral rifampin on 03/17/2020 before converting to oral cephalexin alone.    He has not had any problems tolerating cephalexin.   His mild, chronic left hip pain is unchanged.  His biggest problem is bone-on-bone arthritis in his right knee but he and his orthopedic surgeon have decided to avoid any further surgeries.  He has been busy doing maintenance repairs on [place].  Review of Systems: Review of Systems  Constitutional:  Negative for chills, diaphoresis and fever.  Gastrointestinal:  Negative for diarrhea, nausea and vomiting.  Musculoskeletal:  Positive for joint pain.    Past Medical History:  Diagnosis Date   Anterior dislocation of  right shoulder 09/2013   Reduced in ED under sedation.  Dislocated posteriorly 2015, ? partial RC tear, has f/u planned with Dr. Marlou Sa--? surgery? possible plan for 2016.   Blood donor, whole blood    Has donated approx 30 gallons in his lifetime   Chronic left hip pain    Severe DJD.  THA 2008.  Left acetabular revision in 2017 AND 2019.   Chronic pain syndrome    Candidate for spinal cord stimulator (03/2017)   Chronic renal insufficiency, stage II (mild)    Chronic venous insufficiency    +compression hose   DDD (degenerative disc disease), lumbar 2017   Dr.  Noe Gens ortho.  He is now wearing a new leg brace.  Has had ESI and will get another.   Diverticulosis    +redundant colon   DVT (deep venous thrombosis) ([place]) 06/14/2016   provoked, bilateral LL's; in post-op setting s/p hip surgery.  Xarelto x 64moat full dosing, then 6 more months at 1/2 dosing, then stopped xarelto.     Foot drop, left    s/p hip fracture 1969   Hx of adenomatous colonic polyps    polypectomy 2007; 2012; 2015; 05/2019; recall 545yr  Hypercholesterolemia 02/2017   Recommended statin 02/10/17 but pt declined, wanted to do TLC trial first. Rec'd statin 04/2020   Osteoarthritis    knees and hips primarily   Post laminectomy syndrome    improved with spinal cord stim-->stim to be removed summer/fall 2019.   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 PSA 0.16 Feb 2020 urol f/u. .0[name]4 Aug 2021 urol. <0.015 02/2021.   [place]; 02/2020   MSSA; admitted for I&D 02/11/20->plan for longterm IV abx after   Urge incontinence    vesicare helpful    Social History   Tobacco Use   Smoking status: Never   Smokeless tobacco: Never  Vaping Use   Vaping Use: Never used  Substance Use Topics   Alcohol use: No   Drug use: No    Family History  Problem Relation Age of Onset   Lung cancer Mother    Heart disease Father    Colon cancer Neg Hx    Esophageal cancer Neg Hx    Rectal cancer Neg Hx    Stomach cancer Neg Hx    Colon polyps Neg Hx     Allergies  Allergen Reactions   Adhesive [Tape] Rash    Paper tape is ok    Objective: Vitals:   05/08/22 0830  BP: 120/70  Pulse: (!) 52  Temp: (!) 97.5 F (36.4 C)  TempSrc: Oral  SpO2: 97%  Weight: 261 lb (118.4 kg)   Body mass index is 34.91 kg/m.  Physical Exam Constitutional:      Comments: His spirits are good as usual.  Cardiovascular:     Rate  and Rhythm: Normal rate and regular rhythm.     Heart sounds: No murmur heard. Pulmonary:     Effort: Pulmonary effort is normal.     Breath sounds: Normal breath sounds.  Musculoskeletal:        General: No swelling or tenderness.  Neurological:     Gait: Gait normal.  Psychiatric:        Mood and Affect: Mood normal.     Sed Rate  Date Value  07/27/2020 2 mm/h  02/14/2020 40 mm/hr (H)  01/11/2020 38 mm/h (H)   CRP  Date Value  07/27/2020 10.8 mg/L (H)  02/14/2020 8.6 mg/dL (H)  01/11/2020 34.8 mg/L (H)     Problem List Items Addressed This Visit       High   Prosthetic joint infection of left hip ([place])    He prefers to stay on chronic suppressive antibiotic therapy for his previous relapsing MSSA left prosthetic hip infection.  I will change cephalexin to cefadroxil and see him back in 1 year.      Relevant Medications   cefadroxil (DURICEF) 500 MG capsule    Michel Bickers, MD [facility] [phone] pager   [phone] cell 05/08/2022, 8:58 AM

## 2022-06-14 DIAGNOSIS — C61 Malignant neoplasm of prostate: Secondary | ICD-10-CM | POA: Diagnosis not present

## 2022-06-14 LAB — PSA: PSA: <0.015

## 2022-06-14 LAB — TESTOSTERONE: Testosterone: 21.3

## 2022-06-19 DIAGNOSIS — C775 Secondary and unspecified malignant neoplasm of intrapelvic lymph nodes: Secondary | ICD-10-CM | POA: Diagnosis not present

## 2022-06-19 DIAGNOSIS — C61 Malignant neoplasm of prostate: Secondary | ICD-10-CM | POA: Diagnosis not present

## 2022-09-12 DIAGNOSIS — C61 Malignant neoplasm of prostate: Secondary | ICD-10-CM | POA: Diagnosis not present

## 2022-09-19 DIAGNOSIS — E349 Endocrine disorder, unspecified: Secondary | ICD-10-CM | POA: Diagnosis not present

## 2022-09-19 DIAGNOSIS — N400 Enlarged prostate without lower urinary tract symptoms: Secondary | ICD-10-CM | POA: Diagnosis not present

## 2022-09-19 DIAGNOSIS — C61 Malignant neoplasm of prostate: Secondary | ICD-10-CM | POA: Diagnosis not present

## 2022-10-02 ENCOUNTER — Encounter: Payer: Self-pay | Admitting: Orthopaedic Surgery

## 2022-10-02 ENCOUNTER — Ambulatory Visit (INDEPENDENT_AMBULATORY_CARE_PROVIDER_SITE_OTHER): Payer: PPO | Admitting: Orthopaedic Surgery

## 2022-10-02 ENCOUNTER — Ambulatory Visit (INDEPENDENT_AMBULATORY_CARE_PROVIDER_SITE_OTHER): Payer: PPO

## 2022-10-02 DIAGNOSIS — G8929 Other chronic pain: Secondary | ICD-10-CM | POA: Diagnosis not present

## 2022-10-02 DIAGNOSIS — M25561 Pain in right knee: Secondary | ICD-10-CM | POA: Diagnosis not present

## 2022-10-02 DIAGNOSIS — M1711 Unilateral primary osteoarthritis, right knee: Secondary | ICD-10-CM

## 2022-10-02 DIAGNOSIS — Z96642 Presence of left artificial hip joint: Secondary | ICD-10-CM

## 2022-10-02 MED ORDER — LIDOCAINE HCL 1 % IJ SOLN
3.0000 mL | INTRAMUSCULAR | Status: AC | PRN
  Administered 2022-10-02: 3 mL

## 2022-10-02 MED ORDER — METHYLPREDNISOLONE ACETATE 40 MG/ML IJ SUSP
40.0000 mg | INTRAMUSCULAR | Status: AC | PRN
  Administered 2022-10-02: 40 mg via INTRA_ARTICULAR

## 2022-10-02 NOTE — Progress Notes (Signed)
The patient is here for continued follow-up as a relates to a chronic left hip infection.  It has been a year since he has been seen.  He does have known osteoarthritis of the right knee and is requesting a steroid injection in that right knee today.  He says the left hip is been still well-maintained.  He had some bruising recently after power washing it back but he is on blood thinning medications and that is now getting better.  He does walk with a cane.  He is 76 years old.  He is otherwise had no acute changes in medical status.  His right knee does have varus malalignment and significant medial joint line tenderness.  He has known significant osteoarthritis of the right knee.  His left hip moves smoothly and fluidly with no pain at all.  Standing AP pelvis shows a well-seated revision implant of a total hip on the left side with no complicating features.  Per the patient's request I did provide a steroid injection in his right knee today.  We can always repeat that in 3 to 4 months.  No x-rays are needed again of his hip unless he starts having issues with the left hip.  All question concerns were answered and addressed.      Procedure Note  Patient: [name]             Date of Birth: 09-Dec-1945           MRN: [id number]             Visit Date: 10/02/2022  Procedures: Visit Diagnoses:  1. History of revision of total replacement of left hip joint   2. Chronic pain of right knee   3. Unilateral primary osteoarthritis, right knee     Large Joint Inj: R knee on 10/02/2022 8:33 AM Indications: diagnostic evaluation and pain Details: 22 G 1.5 in needle, superolateral approach  Arthrogram: No  Medications: 3 mL lidocaine 1 %; 40 mg methylPREDNISolone acetate 40 MG/ML Outcome: tolerated well, no immediate complications Procedure, treatment alternatives, risks and benefits explained, specific risks discussed. Consent was given by the patient. Immediately prior to procedure a  time out was called to verify the correct patient, procedure, equipment, support staff and site/side marked as required. Patient was prepped and draped in the usual sterile fashion.

## 2022-12-27 ENCOUNTER — Other Ambulatory Visit: Payer: Self-pay | Admitting: Urology

## 2022-12-27 DIAGNOSIS — M899 Disorder of bone, unspecified: Secondary | ICD-10-CM

## 2023-01-01 ENCOUNTER — Ambulatory Visit (INDEPENDENT_AMBULATORY_CARE_PROVIDER_SITE_OTHER): Payer: PPO | Admitting: Orthopaedic Surgery

## 2023-01-01 ENCOUNTER — Encounter: Payer: Self-pay | Admitting: Orthopaedic Surgery

## 2023-01-01 ENCOUNTER — Telehealth: Payer: Self-pay | Admitting: Orthopaedic Surgery

## 2023-01-01 DIAGNOSIS — T8452XS Infection and inflammatory reaction due to internal left hip prosthesis, sequela: Secondary | ICD-10-CM

## 2023-01-01 DIAGNOSIS — Z96642 Presence of left artificial hip joint: Secondary | ICD-10-CM | POA: Diagnosis not present

## 2023-01-01 DIAGNOSIS — M009 Pyogenic arthritis, unspecified: Secondary | ICD-10-CM

## 2023-01-01 NOTE — Progress Notes (Signed)
The patient is a 77 year old gentleman well-known to me.  He has a complicated history as it relates to his left hip with a history of multiple hip surgeries and revisions.  He then developed a chronic infection and has been on suppressive antibiotics for many years now followed by the ID clinic and Dr. Megan Salon.  We last washed out his hip about 3 years ago.  He wants to remain in a position of chronic suppression with occasional I&D's given his age combined with the fact that he is taking care of his wife who now his frontal lobe dementia.  I have been seeing him for some time now and he has not had any issues.  However the has developed some drainage since Friday from his left hip.  On exam there is area at the superior aspect of his incision that is draining and worrisome for deep infection.  He understands this as well as I have seen him today in the office.  I have recommended an incision and drainage/irrigation debridement of his left hip and then having admitted for PICC line placement to continue antibiotic suppression.  He still would like to have no hardware removed which would certainly be quite difficult and debilitating.  Our plan will be to proceed to surgery at the end of the week for an I&D of the left hip and then admitted for PICC line placement and IV antibiotics with an ID consult.  We would then see him back at 2 weeks postoperative.

## 2023-01-01 NOTE — Telephone Encounter (Signed)
LMOM for patient with an appt

## 2023-01-01 NOTE — Telephone Encounter (Signed)
Patient states he is having surgery this Friday and was going to do a P/O appointment, no openings till 01/27/23. He also asked if you would call about the appointment. I can if you give me date and time.

## 2023-01-01 NOTE — Patient Instructions (Signed)
DUE TO COVID-19 ONLY TWO VISITORS  (aged 78 and older)  ARE ALLOWED TO COME WITH YOU AND STAY IN THE WAITING ROOM ONLY DURING PRE OP AND PROCEDURE.   **NO VISITORS ARE ALLOWED IN THE SHORT STAY AREA OR RECOVERY ROOM!!**  IF YOU WILL BE ADMITTED INTO THE HOSPITAL YOU ARE ALLOWED ONLY FOUR SUPPORT PEOPLE DURING VISITATION HOURS ONLY (7 AM -8PM)   The support person(s) must pass our screening, gel in and out, and wear a mask at all times, including in the patient's room. Patients must also wear a mask when staff or their support person are in the room. Visitors GUEST BADGE MUST BE WORN VISIBLY  One adult visitor may remain with you overnight and MUST be in the room by 8 P.M.     Your procedure is scheduled on: 01/03/23   Report to [facility] Main Entrance    Report to admitting at : 9:45 AM   Call this number if you have problems the morning of surgery [phone]   Do not eat food :After Midnight.   After Midnight you may have the following liquids until : 9:00 AM DAY OF SURGERY  Water Black Coffee (sugar ok, NO MILK/CREAM OR CREAMERS)  Tea (sugar ok, NO MILK/CREAM OR CREAMERS) regular and decaf                             Plain Jell-O (NO RED)                                           Fruit ices (not with fruit pulp, NO RED)                                     Popsicles (NO RED)                                                                  Juice: apple, WHITE grape, WHITE cranberry Sports drinks like Gatorade (NO RED)   The day of surgery:  Drink ONE (1) Pre-Surgery Clear Ensure at : 9:00 AM the morning of surgery. Drink in one sitting. Do not sip.  This drink was given to you during your hospital  pre-op appointment visit. Nothing else to drink after completing the  Pre-Surgery Clear Ensure or G2.          If you have questions, please contact your surgeon's office.   Oral Hygiene is also important to reduce your risk of infection.                                     Remember - BRUSH YOUR TEETH THE MORNING OF SURGERY WITH YOUR REGULAR TOOTHPASTE  DENTURES WILL BE REMOVED PRIOR TO SURGERY PLEASE DO NOT APPLY "Poly grip" OR ADHESIVES!!!   Do NOT smoke after Midnight   Take these medicines the morning of surgery with A SIP OF WATER: N/A  You may not have any metal on your body including hair pins, jewelry, and body piercing             Do not wear lotions, powders, perfumes/cologne, or deodorant              Men may shave face and neck.   Do not bring valuables to the hospital. [place].   Contacts, glasses, or bridgework may not be worn into surgery.   Bring small overnight bag day of surgery.   DO NOT [place]. PHARMACY WILL DISPENSE MEDICATIONS LISTED ON YOUR MEDICATION LIST TO YOU DURING YOUR ADMISSION [place]!    Patients discharged on the day of surgery will not be allowed to drive home.  Someone NEEDS to stay with you for the first 24 hours after anesthesia.   Special Instructions: Bring a copy of your healthcare power of attorney and living will documents         the day of surgery if you haven't scanned them before.              Please read over the following fact sheets you were given: IF YOU HAVE QUESTIONS ABOUT YOUR PRE-OP INSTRUCTIONS PLEASE CALL [phone]    [facility] - Preparing for Surgery Before surgery, you can play an important role.  Because skin is not sterile, your skin needs to be as free of germs as possible.  You can reduce the number of germs on your skin by washing with CHG (chlorahexidine gluconate) soap before surgery.  CHG is an antiseptic cleaner which kills germs and bonds with the skin to continue killing germs even after washing. Please DO NOT use if you have an allergy to CHG or antibacterial soaps.  If your skin becomes reddened/irritated stop using the CHG and inform your nurse when you  arrive at Short Stay. Do not shave (including legs and underarms) for at least 48 hours prior to the first CHG shower.  You may shave your face/neck. Please follow these instructions carefully:  1.  Shower with CHG Soap the night before surgery and the  morning of Surgery.  2.  If you choose to wash your hair, wash your hair first as usual with your  normal  shampoo.  3.  After you shampoo, rinse your hair and body thoroughly to remove the  shampoo.                           4.  Use CHG as you would any other liquid soap.  You can apply chg directly  to the skin and wash                       Gently with a scrungie or clean washcloth.  5.  Apply the CHG Soap to your body ONLY FROM THE NECK DOWN.   Do not use on face/ open                           Wound or open sores. Avoid contact with eyes, ears mouth and genitals (private parts).                       Wash face,  Genitals (private parts) with your  normal soap.             6.  Wash thoroughly, paying special attention to the area where your surgery  will be performed.  7.  Thoroughly rinse your body with warm water from the neck down.  8.  DO NOT shower/wash with your normal soap after using and rinsing off  the CHG Soap.                9.  Pat yourself dry with a clean towel.            10.  Wear clean pajamas.            11.  Place clean sheets on your bed the night of your first shower and do not  sleep with pets. Day of Surgery : Do not apply any lotions/deodorants the morning of surgery.  Please wear clean clothes to the hospital/surgery center.  FAILURE TO FOLLOW THESE INSTRUCTIONS MAY RESULT IN THE CANCELLATION OF YOUR SURGERY PATIENT SIGNATURE_________________________________  NURSE SIGNATURE__________________________________  ________________________________________________________________________  [name]  An incentive spirometer is a tool that can help keep your lungs clear and active. This tool measures how well  you are filling your lungs with each breath. Taking long deep breaths may help reverse or decrease the chance of developing breathing (pulmonary) problems (especially infection) following: A long period of time when you are unable to move or be active. BEFORE THE PROCEDURE  If the spirometer includes an indicator to show your best effort, your nurse or respiratory therapist will set it to a desired goal. If possible, sit up straight or lean slightly forward. Try not to slouch. Hold the incentive spirometer in an upright position. INSTRUCTIONS FOR USE  Sit on the edge of your bed if possible, or sit up as far as you can in bed or on a chair. Hold the incentive spirometer in an upright position. Breathe out normally. Place the mouthpiece in your mouth and seal your lips tightly around it. Breathe in slowly and as deeply as possible, raising the piston or the ball toward the top of the column. Hold your breath for 3-5 seconds or for as long as possible. Allow the piston or ball to fall to the bottom of the column. Remove the mouthpiece from your mouth and breathe out normally. Rest for a few seconds and repeat Steps 1 through 7 at least 10 times every 1-2 hours when you are awake. Take your time and take a few normal breaths between deep breaths. The spirometer may include an indicator to show your best effort. Use the indicator as a goal to work toward during each repetition. After each set of 10 deep breaths, practice coughing to be sure your lungs are clear. If you have an incision (the cut made at the time of surgery), support your incision when coughing by placing a pillow or rolled up towels firmly against it. Once you are able to get out of bed, walk around indoors and cough well. You may stop using the incentive spirometer when instructed by your caregiver.  RISKS AND COMPLICATIONS Take your time so you do not get dizzy or light-headed. If you are in pain, you may need to take or ask for  pain medication before doing incentive spirometry. It is harder to take a deep breath if you are having pain. AFTER USE Rest and breathe slowly and easily. It can be helpful to keep track of a log of your progress. Your caregiver can  provide you with a simple table to help with this. If you are using the spirometer at home, follow these instructions: [place] IF:  You are having difficultly using the spirometer. You have trouble using the spirometer as often as instructed. Your pain medication is not giving enough relief while using the spirometer. You develop fever of 100.5 F (38.1 C) or higher. SEEK IMMEDIATE MEDICAL CARE IF:  You cough up bloody sputum that had not been present before. You develop fever of 102 F (38.9 C) or greater. You develop worsening pain at or near the incision site. MAKE SURE YOU:  Understand these instructions. Will watch your condition. Will get help right away if you are not doing well or get worse. Document Released: 02/03/2007 Document Revised: 12/16/2011 Document Reviewed: 04/06/2007 [facility] Patient Information 2014 [place], [place].   ________________________________________________________________________

## 2023-01-02 ENCOUNTER — Other Ambulatory Visit: Payer: Self-pay

## 2023-01-02 ENCOUNTER — Encounter (HOSPITAL_COMMUNITY): Payer: Self-pay

## 2023-01-02 ENCOUNTER — Encounter (HOSPITAL_COMMUNITY)
Admission: RE | Admit: 2023-01-02 | Discharge: 2023-01-02 | Disposition: A | Discharge status: Home / Self Care | Payer: PPO | Source: Ambulatory Visit | Attending: Orthopaedic Surgery | Admitting: Orthopaedic Surgery

## 2023-01-02 VITALS — BP 149/73 | HR 61 | Temp 97.9°F | Ht 72.0 in | Wt 254.0 lb

## 2023-01-02 DIAGNOSIS — Z8546 Personal history of malignant neoplasm of prostate: Secondary | ICD-10-CM | POA: Diagnosis not present

## 2023-01-02 DIAGNOSIS — Z8781 Personal history of (healed) traumatic fracture: Secondary | ICD-10-CM | POA: Diagnosis not present

## 2023-01-02 DIAGNOSIS — Z01818 Encounter for other preprocedural examination: Secondary | ICD-10-CM | POA: Insufficient documentation

## 2023-01-02 DIAGNOSIS — Z91048 Other nonmedicinal substance allergy status: Secondary | ICD-10-CM | POA: Diagnosis not present

## 2023-01-02 DIAGNOSIS — G894 Chronic pain syndrome: Secondary | ICD-10-CM | POA: Diagnosis present

## 2023-01-02 DIAGNOSIS — Z636 Dependent relative needing care at home: Secondary | ICD-10-CM | POA: Diagnosis not present

## 2023-01-02 DIAGNOSIS — T8452XD Infection and inflammatory reaction due to internal left hip prosthesis, subsequent encounter: Secondary | ICD-10-CM | POA: Diagnosis not present

## 2023-01-02 DIAGNOSIS — Z792 Long term (current) use of antibiotics: Secondary | ICD-10-CM | POA: Diagnosis not present

## 2023-01-02 DIAGNOSIS — G8929 Other chronic pain: Secondary | ICD-10-CM | POA: Diagnosis present

## 2023-01-02 DIAGNOSIS — R030 Elevated blood-pressure reading, without diagnosis of hypertension: Secondary | ICD-10-CM | POA: Insufficient documentation

## 2023-01-02 DIAGNOSIS — Z8249 Family history of ischemic heart disease and other diseases of the circulatory system: Secondary | ICD-10-CM | POA: Diagnosis not present

## 2023-01-02 DIAGNOSIS — Z8614 Personal history of Methicillin resistant Staphylococcus aureus infection: Secondary | ICD-10-CM | POA: Diagnosis not present

## 2023-01-02 DIAGNOSIS — M160 Bilateral primary osteoarthritis of hip: Secondary | ICD-10-CM | POA: Diagnosis present

## 2023-01-02 DIAGNOSIS — M170 Bilateral primary osteoarthritis of knee: Secondary | ICD-10-CM | POA: Diagnosis present

## 2023-01-02 DIAGNOSIS — Z8719 Personal history of other diseases of the digestive system: Secondary | ICD-10-CM | POA: Diagnosis not present

## 2023-01-02 DIAGNOSIS — B9561 Methicillin susceptible Staphylococcus aureus infection as the cause of diseases classified elsewhere: Secondary | ICD-10-CM | POA: Diagnosis present

## 2023-01-02 DIAGNOSIS — Y831 Surgical operation with implant of artificial internal device as the cause of abnormal reaction of the patient, or of later complication, without mention of misadventure at the time of the procedure: Secondary | ICD-10-CM | POA: Diagnosis present

## 2023-01-02 DIAGNOSIS — Z86010 Personal history of colonic polyps: Secondary | ICD-10-CM | POA: Diagnosis not present

## 2023-01-02 DIAGNOSIS — Z801 Family history of malignant neoplasm of trachea, bronchus and lung: Secondary | ICD-10-CM | POA: Diagnosis not present

## 2023-01-02 DIAGNOSIS — N182 Chronic kidney disease, stage 2 (mild): Secondary | ICD-10-CM | POA: Diagnosis present

## 2023-01-02 DIAGNOSIS — T8452XA Infection and inflammatory reaction due to internal left hip prosthesis, initial encounter: Secondary | ICD-10-CM | POA: Diagnosis present

## 2023-01-02 DIAGNOSIS — Z96642 Presence of left artificial hip joint: Secondary | ICD-10-CM | POA: Diagnosis not present

## 2023-01-02 DIAGNOSIS — I96 Gangrene, not elsewhere classified: Secondary | ICD-10-CM | POA: Diagnosis present

## 2023-01-02 LAB — BASIC METABOLIC PANEL
Anion gap: 7 (ref 5–15)
BUN: 23 mg/dL (ref 8–23)
CO2: 26 mmol/L (ref 22–32)
Calcium: 10.5 mg/dL — ABNORMAL HIGH (ref 8.9–10.3)
Chloride: 104 mmol/L (ref 98–111)
Creatinine, Ser: 1.2 mg/dL (ref 0.61–1.24)
GFR, Estimated: >60 mL/min (ref >60)
Glucose, Bld: 110 mg/dL — ABNORMAL HIGH (ref 70–99)
Potassium: 4.7 mmol/L (ref 3.5–5.1)
Sodium: 137 mmol/L (ref 135–145)

## 2023-01-02 LAB — SURGICAL PCR SCREEN
MRSA, PCR: NEGATIVE
Staphylococcus aureus: NEGATIVE

## 2023-01-02 LAB — CBC
HCT: 52 % (ref 39.0–52.0)
Hemoglobin: 16.3 g/dL (ref 13.0–17.0)
MCH: 29.4 pg (ref 26.0–34.0)
MCHC: 31.3 g/dL (ref 30.0–36.0)
MCV: 93.7 fL (ref 80.0–100.0)
Platelets: 267 10*3/uL (ref 150–400)
RBC: 5.55 MIL/uL (ref 4.22–5.81)
RDW: 14.1 % (ref 11.5–15.5)
WBC: 7.3 10*3/uL (ref 4.0–10.5)
nRBC: 0 % (ref 0.0–0.2)

## 2023-01-02 NOTE — H&P (Signed)
[name] is an 77 y.o. male.   Chief Complaint:   Chronic infection left hip HPI: The patient is a 77 year old gentleman with a long and complex history as a relates to his left hip.  He originally had a left total hip arthroplasty back in 2008.  Unfortunately he eventually developed failure of the acetabular component and underwent a revision of that left hip acetabular opponent back in 2017.  He then developed loosening and failure of the revision after a component and in 2019 had a second revision surgery of the acetabular component.  In 2021 he was taken to the operating room for suspected infection of that left hip and since that time has been on suppressive antibiotics followed by the ID service with the intention of maintaining all components.  Just last week he noted a wound at the superior aspect of his previous hip incision and this started to drain.  He presents now for an irrigation and debridement of his left hip.  We talked about the possibility of an excision arthroplasty in order to eradicate the infection.  However, he is not in favor of this given his age combined with the fact that this would cause him to become quite debilitated.  His wife has had a recent diagnosis of frontal lobe dementia and this is quite concerning to him and he wishes for [place] to proceed only with an irrigation debridement of the left hip with retaining components and continued suppression with long-term antibiotics.  Past Medical History:  Diagnosis Date   Anterior dislocation of right shoulder 09/2013   Reduced in ED under sedation.  Dislocated posteriorly 2015, ? partial RC tear, has f/u planned with Dr. Marlou Sa--? surgery? possible plan for 2016.   Blood donor, whole blood    Has donated approx 30 gallons in his lifetime   Chronic left hip pain    Severe DJD.  THA 2008.  Left acetabular revision in 2017 AND 2019.   Chronic pain syndrome    Candidate for spinal cord stimulator (03/2017)   Chronic renal  insufficiency, stage II (mild)    Chronic venous insufficiency    +compression hose   DDD (degenerative disc disease), lumbar 2017   Dr. Noe Gens ortho.  He is now wearing a new leg brace.  Has had ESI and will get another.   Diverticulosis    +redundant colon   DVT (deep venous thrombosis) ([place]) 06/14/2016   provoked, bilateral LL's; in post-op setting s/p hip surgery.  Xarelto x 27mo at full dosing, then 6 more months at 1/2 dosing, then stopped xarelto.     Foot drop, left    s/p hip fracture 1969   Hx of adenomatous colonic polyps    polypectomy 2007; 2012; 2015; 05/2019; recall 73yrs   Hypercholesterolemia 02/2017   Recommended statin 02/10/17 but pt declined, wanted to do TLC trial first. Rec'd statin 04/2020   Osteoarthritis    knees and hips primarily   Post laminectomy syndrome    improved with spinal cord stim-->stim to be removed summer/fall 2019.   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 PSA 0.16 Feb 2020 urol f/u. [name] Aug 2021 urol. <0.015 02/2021.   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; 02/2020   MSSA; admitted for I&D 02/11/20->plan for longterm IV abx after   Urge incontinence    vesicare helpful    Past Surgical History:  Procedure  Laterality Date   ANTERIOR HIP REVISION Left 06/07/2016   Procedure: LEFT ANTERIOR HIP ACETABULAR REVISION;  Surgeon: Mcarthur Rossetti, MD;  Location: [facility];  Service: Orthopedics;  Laterality: Left;   ANTERIOR HIP REVISION Left 10/09/2017   Procedure: Left hip acetabular revision;  Surgeon: Mcarthur Rossetti, MD;  Location: [facility];  Service: Orthopedics;  Laterality: Left;   arthroscopy left shoulder Left 2007   bilateral carpal tunnel release  2003   BILATERAL CARPAL TUNNEL RELEASE     COLONOSCOPY  2007;2012;2015;05/2019   04/12/2014 Tubular adenoma x 1; 05/2019 adenomatous polyp; recall 5  yrs.   FRACTURE SURGERY Left    INCISION AND DRAINAGE HIP Left 02/11/2020   Procedure: IRRIGATION AND DEBRIDEMENT LEFT HIP WITH ANTIBIOTIC SPACER PLACEMENT;  Surgeon: Mcarthur Rossetti, MD;  Location: [facility];  Service: Orthopedics;  Laterality: Left;   JOINT REPLACEMENT Left    hip   LUMBAR LAMINECTOMY/DECOMPRESSION MICRODISCECTOMY Left 03/28/2016   Procedure: Left Lumbar three-four Laminoforaminotomy;  Surgeon: Erline Levine, MD;  Location: [place];  Service: Neurosurgery;  Laterality: Left;  left   POLYPECTOMY     RADIOACTIVE SEED IMPLANT  2012   ROTATOR CUFF REPAIR  2006   right   seed implant prostate  2012   SHOULDER ARTHROSCOPY Left    SPINAL CORD STIMULATOR IMPLANT  06/2017   removed a year after was putted in.   TOTAL HIP ARTHROPLASTY  2008   left ([place])   VASECTOMY      Family History  Problem Relation Age of Onset   Lung cancer Mother    Heart disease Father    Colon cancer Neg Hx    Esophageal cancer Neg Hx    Rectal cancer Neg Hx    Stomach cancer Neg Hx    Colon polyps Neg Hx    Social History:  reports that he has never smoked. He has never used smokeless tobacco. He reports that he does not drink alcohol and does not use drugs.  Allergies:  Allergies  Allergen Reactions   Adhesive [Tape] Rash    Paper tape is ok    No medications prior to admission.    Results for orders placed or performed during the hospital encounter of 01/02/23 (from the past 48 hour(s))  Basic metabolic panel per protocol     Status: Abnormal   Collection Time: 01/02/23  9:33 AM  Result Value Ref Range   Sodium 137 135 - 145 mmol/L   Potassium 4.7 3.5 - 5.1 mmol/L   Chloride 104 98 - 111 mmol/L   CO2 26 22 - 32 mmol/L   Glucose, Bld 110 (H) 70 - 99 mg/dL    Comment: Glucose reference range applies only to samples taken after fasting for at least 8 hours.   BUN 23 8 - 23 mg/dL   Creatinine, Ser 1.20 0.61 - 1.24 mg/dL   Calcium 10.5 (H) 8.9 - 10.3 mg/dL    GFR, Estimated >60 >60 mL/min    Comment: (NOTE) Calculated using the CKD-EPI Creatinine Equation (2021)    Anion gap 7 5 - 15    Comment: Performed at [facility], [place] [address], [place], [place] [address]  CBC per protocol     Status: None   Collection Time: 01/02/23  9:33 AM  Result Value Ref Range   WBC 7.3 4.0 - 10.5 K/uL   RBC 5.55 4.22 - 5.81 MIL/uL   Hemoglobin 16.3 13.0 - 17.0 g/dL   HCT  52.0 39.0 - 52.0 %   MCV 93.7 80.0 - 100.0 fL   MCH 29.4 26.0 - 34.0 pg   MCHC 31.3 30.0 - 36.0 g/dL   RDW 14.1 11.5 - 15.5 %   Platelets 267 150 - 400 K/uL   nRBC 0.0 0.0 - 0.2 %    Comment: Performed at [facility], [place] [address], [place], [place] [address]  Surgical pcr screen     Status: None   Collection Time: 01/02/23  9:33 AM   Specimen: Nasal Mucosa; Nasal Swab  Result Value Ref Range   MRSA, PCR NEGATIVE NEGATIVE   Staphylococcus aureus NEGATIVE NEGATIVE    Comment: (NOTE) The Xpert SA Assay (FDA approved for NASAL specimens in patients 94 years of age and older), is one component of a comprehensive surveillance program. It is not intended to diagnose infection nor to guide or monitor treatment. Performed at [facility], [place] [address], [place], [place] [address]    No results found.  Review of Systems  There were no vitals taken for this visit. Physical Exam Vitals reviewed.  Constitutional:      Appearance: Normal appearance.  HENT:     Head: Normocephalic and atraumatic.  Eyes:     Extraocular Movements: Extraocular movements intact.     Pupils: Pupils are equal, round, and reactive to light.  Cardiovascular:     Rate and Rhythm: Normal rate.  Pulmonary:     Effort: Pulmonary effort is normal.  Abdominal:     Palpations: Abdomen is soft.  Musculoskeletal:     Cervical back: Normal range of motion and neck supple.       Legs:     Comments: Draining wound at superior aspect of previous hip  incision   Neurological:     Mental Status: He is alert and oriented to person, place, and time.  Psychiatric:        Behavior: Behavior normal.      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  He will then be admitted for PICC line placement and setting him up for a minimum of 6 weeks IV antibiotics.  Mcarthur Rossetti, MD 01/02/2023, 7:34 PM

## 2023-01-02 NOTE — Progress Notes (Signed)
For Short Stay: [place] appointment date:  Bowel Prep reminder:   For Anesthesia: PCP - Dr. Nils Pyle. LOV: 05/03/23 Cardiologist - N/A  Chest x-ray -  EKG -  Stress Test -  ECHO -  Cardiac Cath -  Pacemaker/ICD device last checked: Pacemaker orders received: Device Rep notified:  Spinal Cord Stimulator:  Sleep Study -  CPAP -   Fasting Blood Sugar - N/A Checks Blood Sugar _____ times a day Date and result of last Hgb A1c-  Last dose of GLP1 agonist-  GLP1 instructions:   Last dose of SGLT-2 inhibitors-  SGLT-2 instructions:   Blood Thinner Instructions: Aspirin Instructions: On hold. Last Dose:02/01/23  Activity level: Can go up a flight of stairs and activities of daily living without stopping and without chest pain and/or shortness of breath   Able to exercise without chest pain and/or shortness of breath    Anesthesia review: Hx: DVT,CRD II  Patient denies shortness of breath, fever, cough and chest pain at PAT appointment   Patient verbalized understanding of instructions that were given to them at the PAT appointment. Patient was also instructed that they will need to review over the PAT instructions again at home before surgery.

## 2023-01-03 ENCOUNTER — Ambulatory Visit (HOSPITAL_COMMUNITY): Payer: PPO

## 2023-01-03 ENCOUNTER — Encounter (HOSPITAL_COMMUNITY): Payer: Self-pay | Admitting: Orthopaedic Surgery

## 2023-01-03 ENCOUNTER — Other Ambulatory Visit: Payer: Self-pay

## 2023-01-03 ENCOUNTER — Encounter (HOSPITAL_COMMUNITY)
Admission: RE | Disposition: A | Discharge status: Home / Self Care | Payer: Self-pay | Source: Ambulatory Visit | Attending: Orthopaedic Surgery

## 2023-01-03 ENCOUNTER — Inpatient Hospital Stay (HOSPITAL_COMMUNITY)
Admission: RE | Admit: 2023-01-03 | Discharge: 2023-01-06 | DRG: 464 | Disposition: A | Discharge status: Home Health | Payer: PPO | Source: Ambulatory Visit | Attending: Orthopaedic Surgery | Admitting: Orthopaedic Surgery

## 2023-01-03 DIAGNOSIS — Y831 Surgical operation with implant of artificial internal device as the cause of abnormal reaction of the patient, or of later complication, without mention of misadventure at the time of the procedure: Secondary | ICD-10-CM | POA: Diagnosis present

## 2023-01-03 DIAGNOSIS — Z8546 Personal history of malignant neoplasm of prostate: Secondary | ICD-10-CM | POA: Diagnosis not present

## 2023-01-03 DIAGNOSIS — G8929 Other chronic pain: Secondary | ICD-10-CM | POA: Diagnosis present

## 2023-01-03 DIAGNOSIS — Z801 Family history of malignant neoplasm of trachea, bronchus and lung: Secondary | ICD-10-CM | POA: Diagnosis not present

## 2023-01-03 DIAGNOSIS — T8452XD Infection and inflammatory reaction due to internal left hip prosthesis, subsequent encounter: Secondary | ICD-10-CM

## 2023-01-03 DIAGNOSIS — T8459XA Infection and inflammatory reaction due to other internal joint prosthesis, initial encounter: Secondary | ICD-10-CM

## 2023-01-03 DIAGNOSIS — Z86010 Personal history of colonic polyps: Secondary | ICD-10-CM | POA: Diagnosis not present

## 2023-01-03 DIAGNOSIS — Z8719 Personal history of other diseases of the digestive system: Secondary | ICD-10-CM | POA: Diagnosis not present

## 2023-01-03 DIAGNOSIS — Z8614 Personal history of Methicillin resistant Staphylococcus aureus infection: Secondary | ICD-10-CM

## 2023-01-03 DIAGNOSIS — M160 Bilateral primary osteoarthritis of hip: Secondary | ICD-10-CM | POA: Diagnosis present

## 2023-01-03 DIAGNOSIS — N182 Chronic kidney disease, stage 2 (mild): Secondary | ICD-10-CM | POA: Diagnosis present

## 2023-01-03 DIAGNOSIS — T8459XD Infection and inflammatory reaction due to other internal joint prosthesis, subsequent encounter: Principal | ICD-10-CM

## 2023-01-03 DIAGNOSIS — T8452XA Infection and inflammatory reaction due to internal left hip prosthesis, initial encounter: Secondary | ICD-10-CM

## 2023-01-03 DIAGNOSIS — G894 Chronic pain syndrome: Secondary | ICD-10-CM | POA: Diagnosis present

## 2023-01-03 DIAGNOSIS — B9561 Methicillin susceptible Staphylococcus aureus infection as the cause of diseases classified elsewhere: Secondary | ICD-10-CM | POA: Diagnosis present

## 2023-01-03 DIAGNOSIS — Z96649 Presence of unspecified artificial hip joint: Principal | ICD-10-CM

## 2023-01-03 DIAGNOSIS — Z8781 Personal history of (healed) traumatic fracture: Secondary | ICD-10-CM | POA: Diagnosis not present

## 2023-01-03 DIAGNOSIS — M170 Bilateral primary osteoarthritis of knee: Secondary | ICD-10-CM | POA: Diagnosis present

## 2023-01-03 DIAGNOSIS — Z792 Long term (current) use of antibiotics: Secondary | ICD-10-CM | POA: Diagnosis not present

## 2023-01-03 DIAGNOSIS — Z8249 Family history of ischemic heart disease and other diseases of the circulatory system: Secondary | ICD-10-CM | POA: Diagnosis not present

## 2023-01-03 DIAGNOSIS — Z91048 Other nonmedicinal substance allergy status: Secondary | ICD-10-CM | POA: Diagnosis not present

## 2023-01-03 DIAGNOSIS — I96 Gangrene, not elsewhere classified: Secondary | ICD-10-CM | POA: Diagnosis present

## 2023-01-03 DIAGNOSIS — Z636 Dependent relative needing care at home: Secondary | ICD-10-CM

## 2023-01-03 DIAGNOSIS — Z96642 Presence of left artificial hip joint: Secondary | ICD-10-CM

## 2023-01-03 HISTORY — PX: INCISION AND DRAINAGE HIP: SHX1801

## 2023-01-03 SURGERY — IRRIGATION AND DEBRIDEMENT HIP
Anesthesia: General | Site: Hip | Laterality: Left

## 2023-01-03 MED ORDER — ACETAMINOPHEN 10 MG/ML IV SOLN
1000.0000 mg | Freq: Once | INTRAVENOUS | Status: DC | PRN
  Administered 2023-01-03: 1000 mg via INTRAVENOUS

## 2023-01-03 MED ORDER — ONDANSETRON HCL 4 MG/2ML IJ SOLN
INTRAMUSCULAR | Status: DC | PRN
  Administered 2023-01-03: 4 mg via INTRAVENOUS

## 2023-01-03 MED ORDER — LIDOCAINE HCL (PF) 2 % IJ SOLN
INTRAMUSCULAR | Status: DC | PRN
  Administered 2023-01-03: 60 mg via INTRADERMAL

## 2023-01-03 MED ORDER — VANCOMYCIN HCL 1000 MG IV SOLR
INTRAVENOUS | Status: AC
  Filled 2023-01-03: qty 20

## 2023-01-03 MED ORDER — METOCLOPRAMIDE HCL 5 MG PO TABS: 5.0000 mg | ORAL_TABLET | Freq: Three times a day (TID) | ORAL | Status: DC | PRN

## 2023-01-03 MED ORDER — ACETAMINOPHEN 325 MG PO TABS: 325.0000 mg | ORAL_TABLET | Freq: Four times a day (QID) | ORAL | Status: DC | PRN

## 2023-01-03 MED ORDER — ONDANSETRON HCL 4 MG/2ML IJ SOLN: 4.0000 mg | Freq: Four times a day (QID) | INTRAMUSCULAR | Status: DC | PRN

## 2023-01-03 MED ORDER — VANCOMYCIN HCL 1000 MG IV SOLR
INTRAVENOUS | Status: DC | PRN
  Administered 2023-01-03: 1000 mg via INTRAVENOUS

## 2023-01-03 MED ORDER — ONDANSETRON HCL 4 MG PO TABS: 4.0000 mg | ORAL_TABLET | Freq: Four times a day (QID) | ORAL | Status: DC | PRN

## 2023-01-03 MED ORDER — FENTANYL CITRATE PF 50 MCG/ML IJ SOSY
25.0000 ug | PREFILLED_SYRINGE | INTRAMUSCULAR | Status: DC | PRN
  Administered 2023-01-03 (×2): 50 ug via INTRAVENOUS

## 2023-01-03 MED ORDER — CHLORHEXIDINE GLUCONATE 0.12 % MT SOLN
15.0000 mL | Freq: Once | OROMUCOSAL | Status: AC
  Administered 2023-01-03: 15 mL via OROMUCOSAL

## 2023-01-03 MED ORDER — DOCUSATE SODIUM 100 MG PO CAPS
100.0000 mg | ORAL_CAPSULE | Freq: Two times a day (BID) | ORAL | Status: DC
  Administered 2023-01-03 – 2023-01-06 (×6): 100 mg via ORAL
  Filled 2023-01-03 (×6): qty 1

## 2023-01-03 MED ORDER — OXYCODONE HCL 5 MG PO TABS
5.0000 mg | ORAL_TABLET | ORAL | Status: DC | PRN
  Administered 2023-01-03 – 2023-01-05 (×4): 10 mg via ORAL
  Administered 2023-01-06: 5 mg via ORAL
  Filled 2023-01-03 (×6): qty 2

## 2023-01-03 MED ORDER — METOCLOPRAMIDE HCL 5 MG/ML IJ SOLN: 5.0000 mg | Freq: Three times a day (TID) | INTRAMUSCULAR | Status: DC | PRN

## 2023-01-03 MED ORDER — FENTANYL CITRATE PF 50 MCG/ML IJ SOSY
PREFILLED_SYRINGE | INTRAMUSCULAR | Status: AC
  Filled 2023-01-03: qty 1

## 2023-01-03 MED ORDER — MIDAZOLAM HCL 2 MG/2ML IJ SOLN
INTRAMUSCULAR | Status: AC
  Filled 2023-01-03: qty 2

## 2023-01-03 MED ORDER — ACETAMINOPHEN 10 MG/ML IV SOLN
INTRAVENOUS | Status: AC
  Filled 2023-01-03: qty 100

## 2023-01-03 MED ORDER — PROPOFOL 10 MG/ML IV BOLUS
INTRAVENOUS | Status: AC
  Filled 2023-01-03: qty 20

## 2023-01-03 MED ORDER — FENTANYL CITRATE (PF) 100 MCG/2ML IJ SOLN
INTRAMUSCULAR | Status: DC | PRN
  Administered 2023-01-03 (×2): 50 ug via INTRAVENOUS

## 2023-01-03 MED ORDER — HYDROMORPHONE HCL 1 MG/ML IJ SOLN: 0.5000 mg | INTRAMUSCULAR | Status: DC | PRN

## 2023-01-03 MED ORDER — METHOCARBAMOL 500 MG IVPB - SIMPLE MED
500.0000 mg | Freq: Four times a day (QID) | INTRAVENOUS | Status: DC | PRN
  Administered 2023-01-03: 500 mg via INTRAVENOUS

## 2023-01-03 MED ORDER — VANCOMYCIN HCL 1000 MG IV SOLR
INTRAVENOUS | Status: DC | PRN
  Administered 2023-01-03: 1000 mg via TOPICAL

## 2023-01-03 MED ORDER — SODIUM CHLORIDE 0.9 % IR SOLN
Status: DC | PRN
  Administered 2023-01-03: 3000 mL

## 2023-01-03 MED ORDER — OXYCODONE HCL 5 MG PO TABS
10.0000 mg | ORAL_TABLET | ORAL | Status: DC | PRN
  Administered 2023-01-03: 10 mg via ORAL

## 2023-01-03 MED ORDER — FENTANYL CITRATE (PF) 100 MCG/2ML IJ SOLN
INTRAMUSCULAR | Status: AC
  Filled 2023-01-03: qty 2

## 2023-01-03 MED ORDER — ORAL CARE MOUTH RINSE: 15.0000 mL | OROMUCOSAL | Status: DC | PRN

## 2023-01-03 MED ORDER — ASPIRIN 81 MG PO TBEC
81.0000 mg | DELAYED_RELEASE_TABLET | Freq: Every day | ORAL | Status: DC
  Administered 2023-01-03 – 2023-01-06 (×4): 81 mg via ORAL
  Filled 2023-01-03 (×4): qty 1

## 2023-01-03 MED ORDER — SODIUM CHLORIDE 0.9 % IV SOLN: INTRAVENOUS | Status: DC

## 2023-01-03 MED ORDER — EPHEDRINE SULFATE (PRESSORS) 50 MG/ML IJ SOLN
INTRAMUSCULAR | Status: DC | PRN
  Administered 2023-01-03 (×2): 10 mg via INTRAVENOUS

## 2023-01-03 MED ORDER — ATORVASTATIN CALCIUM 20 MG PO TABS
20.0000 mg | ORAL_TABLET | Freq: Every day | ORAL | Status: DC
  Administered 2023-01-04 – 2023-01-06 (×3): 20 mg via ORAL
  Filled 2023-01-03 (×3): qty 1

## 2023-01-03 MED ORDER — METHOCARBAMOL 500 MG PO TABS
500.0000 mg | ORAL_TABLET | Freq: Four times a day (QID) | ORAL | Status: DC | PRN
  Administered 2023-01-03 – 2023-01-05 (×3): 500 mg via ORAL
  Filled 2023-01-03 (×3): qty 1

## 2023-01-03 MED ORDER — PROPOFOL 10 MG/ML IV BOLUS
INTRAVENOUS | Status: DC | PRN
  Administered 2023-01-03: 20 mg via INTRAVENOUS
  Administered 2023-01-03: 140 mg via INTRAVENOUS

## 2023-01-03 MED ORDER — MIDAZOLAM HCL 5 MG/5ML IJ SOLN
INTRAMUSCULAR | Status: DC | PRN
  Administered 2023-01-03: 2 mg via INTRAVENOUS

## 2023-01-03 MED ORDER — VANCOMYCIN HCL IN DEXTROSE 1-5 GM/200ML-% IV SOLN
INTRAVENOUS | Status: AC
  Filled 2023-01-03: qty 200

## 2023-01-03 MED ORDER — EPHEDRINE 5 MG/ML INJ
INTRAVENOUS | Status: AC
  Filled 2023-01-03: qty 5

## 2023-01-03 MED ORDER — DIPHENHYDRAMINE HCL 12.5 MG/5ML PO ELIX: 12.5000 mg | ORAL_SOLUTION | ORAL | Status: DC | PRN

## 2023-01-03 MED ORDER — VANCOMYCIN HCL 2000 MG/400ML IV SOLN
2000.0000 mg | Freq: Once | INTRAVENOUS | Status: DC
  Filled 2023-01-03: qty 400

## 2023-01-03 MED ORDER — VANCOMYCIN HCL 1750 MG/350ML IV SOLN
1750.0000 mg | INTRAVENOUS | Status: DC
  Filled 2023-01-03: qty 350

## 2023-01-03 MED ORDER — LACTATED RINGERS IV SOLN: INTRAVENOUS | Status: DC

## 2023-01-03 MED ORDER — OXYCODONE HCL 5 MG PO TABS: 5.0000 mg | ORAL_TABLET | Freq: Once | ORAL | Status: DC | PRN

## 2023-01-03 MED ORDER — PRONTOSAN WOUND IRRIGATION OPTIME
TOPICAL | Status: DC | PRN
  Administered 2023-01-03: 350 mL via TOPICAL

## 2023-01-03 MED ORDER — CEFAZOLIN SODIUM-DEXTROSE 2-4 GM/100ML-% IV SOLN
2.0000 g | Freq: Three times a day (TID) | INTRAVENOUS | Status: DC
  Administered 2023-01-03 – 2023-01-04 (×2): 2 g via INTRAVENOUS
  Filled 2023-01-03 (×2): qty 100

## 2023-01-03 MED ORDER — CEFAZOLIN SODIUM-DEXTROSE 2-4 GM/100ML-% IV SOLN
2.0000 g | INTRAVENOUS | Status: AC
  Administered 2023-01-03: 2 g via INTRAVENOUS
  Filled 2023-01-03: qty 100

## 2023-01-03 MED ORDER — ACETAMINOPHEN 500 MG PO TABS: 1000.0000 mg | ORAL_TABLET | Freq: Once | ORAL | Status: DC | PRN

## 2023-01-03 MED ORDER — ORAL CARE MOUTH RINSE: 15.0000 mL | Freq: Once | OROMUCOSAL | Status: AC

## 2023-01-03 MED ORDER — 0.9 % SODIUM CHLORIDE (POUR BTL) OPTIME
TOPICAL | Status: DC | PRN
  Administered 2023-01-03: 1000 mL

## 2023-01-03 MED ORDER — FESOTERODINE FUMARATE ER 4 MG PO TB24
4.0000 mg | ORAL_TABLET | Freq: Every day | ORAL | Status: DC
  Administered 2023-01-04 – 2023-01-06 (×3): 4 mg via ORAL
  Filled 2023-01-03 (×4): qty 1

## 2023-01-03 MED ORDER — DEXAMETHASONE SODIUM PHOSPHATE 10 MG/ML IJ SOLN
INTRAMUSCULAR | Status: DC | PRN
  Administered 2023-01-03: 10 mg via INTRAVENOUS

## 2023-01-03 MED ORDER — ACETAMINOPHEN 160 MG/5ML PO SOLN: 1000.0000 mg | Freq: Once | ORAL | Status: DC | PRN

## 2023-01-03 MED ORDER — METHOCARBAMOL 500 MG IVPB - SIMPLE MED
INTRAVENOUS | Status: AC
  Filled 2023-01-03: qty 55

## 2023-01-03 MED ORDER — OXYCODONE HCL 5 MG/5ML PO SOLN: 5.0000 mg | Freq: Once | ORAL | Status: DC | PRN

## 2023-01-03 SURGICAL SUPPLY — 40 items
APL SKNCLS STERI-STRIP NONHPOA (GAUZE/BANDAGES/DRESSINGS)
BAG COUNTER SPONGE SURGICOUNT (BAG) ×1 IMPLANT
BAG SPEC THK2 15X12 ZIP CLS (MISCELLANEOUS) ×1
BAG SPNG CNTER NS LX DISP (BAG) ×1
BAG ZIPLOCK 12X15 (MISCELLANEOUS) IMPLANT
BENZOIN TINCTURE PRP APPL 2/3 (GAUZE/BANDAGES/DRESSINGS) IMPLANT
BLADE SAW SGTL 18X1.27X75 (BLADE) ×1 IMPLANT
COVER PERINEAL POST (MISCELLANEOUS) ×1 IMPLANT
COVER SURGICAL LIGHT HANDLE (MISCELLANEOUS) ×1 IMPLANT
DRAPE FOOT SWITCH (DRAPES) ×1 IMPLANT
DRAPE STERI IOBAN 125X83 (DRAPES) ×1 IMPLANT
DRAPE U-SHAPE 47X51 STRL (DRAPES) ×2 IMPLANT
DRSG AQUACEL AG ADV 3.5X10 (GAUZE/BANDAGES/DRESSINGS) ×1 IMPLANT
DURAPREP 26ML APPLICATOR (WOUND CARE) ×1 IMPLANT
ELECT REM PT RETURN 15FT ADLT (MISCELLANEOUS) ×1 IMPLANT
GAUZE XEROFORM 1X8 LF (GAUZE/BANDAGES/DRESSINGS) ×1 IMPLANT
GLOVE BIO SURGEON STRL SZ7.5 (GLOVE) ×1 IMPLANT
GLOVE BIOGEL PI IND STRL 8 (GLOVE) ×2 IMPLANT
GLOVE ECLIPSE 8.0 STRL XLNG CF (GLOVE) ×1 IMPLANT
GOWN STRL REUS W/ TWL XL LVL3 (GOWN DISPOSABLE) ×2 IMPLANT
GOWN STRL REUS W/TWL XL LVL3 (GOWN DISPOSABLE) ×2
HANDPIECE INTERPULSE COAX TIP (DISPOSABLE) ×1
HOLDER FOLEY CATH W/STRAP (MISCELLANEOUS) ×1 IMPLANT
KIT TURNOVER KIT A (KITS) IMPLANT
PACK ANTERIOR HIP CUSTOM (KITS) ×1 IMPLANT
SET HNDPC FAN SPRY TIP SCT (DISPOSABLE) ×1 IMPLANT
STAPLER VISISTAT 35W (STAPLE) IMPLANT
STRIP CLOSURE SKIN 1/2X4 (GAUZE/BANDAGES/DRESSINGS) IMPLANT
SUT ETHIBOND NAB CT1 #1 30IN (SUTURE) ×1 IMPLANT
SUT ETHILON 2 0 PS N (SUTURE) IMPLANT
SUT ETHILON 2 0 PSLX (SUTURE) IMPLANT
SUT MNCRL AB 4-0 PS2 18 (SUTURE) IMPLANT
SUT VIC AB 0 CT1 36 (SUTURE) ×1 IMPLANT
SUT VIC AB 1 CT1 36 (SUTURE) ×1 IMPLANT
SUT VIC AB 2-0 CT1 27 (SUTURE) ×2
SUT VIC AB 2-0 CT1 TAPERPNT 27 (SUTURE) ×2 IMPLANT
SWAB COLLECTION DEVICE MRSA (MISCELLANEOUS) IMPLANT
SWAB CULTURE ESWAB REG 1ML (MISCELLANEOUS) IMPLANT
TRAY FOLEY MTR SLVR 16FR STAT (SET/KITS/TRAYS/PACK) IMPLANT
YANKAUER SUCT BULB TIP NO VENT (SUCTIONS) ×1 IMPLANT

## 2023-01-03 NOTE — Transfer of Care (Signed)
Immediate Anesthesia Transfer of Care Note  Patient: [name]  Procedure(s) Performed: IRRIGATION AND DEBRIDEMENT LEFT HIP (Left: Hip)  Patient Location: PACU  Anesthesia Type:General  Level of Consciousness: awake, alert , oriented, and patient cooperative  Airway & Oxygen Therapy: Patient Spontanous Breathing and Patient connected to nasal cannula oxygen  Post-op Assessment: Report given to RN, Post -op Vital signs reviewed and stable, and Patient moving all extremities  Post vital signs: Reviewed and stable  Last Vitals:  Vitals Value Taken Time  BP 133/86 01/03/23 1315  Temp    Pulse 58 01/03/23 1317  Resp 10 01/03/23 1317  SpO2 100 % 01/03/23 1317  Vitals shown include unvalidated device data.  Last Pain:  Vitals:   01/03/23 1000  TempSrc:   PainSc: 2          Complications: No notable events documented.

## 2023-01-03 NOTE — Anesthesia Preprocedure Evaluation (Signed)
Anesthesia Evaluation  Patient identified by MRN, date of birth, ID band Patient awake    Reviewed: Allergy & Precautions, H&P , NPO status , Patient's Chart, lab work & pertinent test results  Airway Mallampati: III  TM Distance: >3 FB Neck ROM: Full    Dental  (+) Teeth Intact, Dental Advisory Given   Pulmonary neg pulmonary ROS   breath sounds clear to auscultation       Cardiovascular negative cardio ROS  Rhythm:Regular     Neuro/Psych  Neuromuscular disease  negative psych ROS   GI/Hepatic negative GI ROS, Neg liver ROS,,,  Endo/Other  negative endocrine ROS    Renal/GU Renal InsufficiencyRenal diseaseLab Results      Component                Value               Date                      CREATININE               1.20                01/02/2023                Musculoskeletal  (+) Arthritis ,   chronic left hip infection   Abdominal   Peds  Hematology negative hematology ROS (+) Lab Results      Component                Value               Date                      WBC                      7.3                 01/02/2023                HGB                      16.3                01/02/2023                HCT                      52.0                01/02/2023                MCV                      93.7                01/02/2023                PLT                      267                 01/02/2023              Anesthesia Other Findings   Reproductive/Obstetrics  Anesthesia Physical Anesthesia Plan  ASA: 2  Anesthesia Plan: General   Post-op Pain Management: Ofirmev IV (intra-op)*   Induction:   PONV Risk Score and Plan: 2 and Ondansetron and Dexamethasone  Airway Management Planned: LMA  Additional Equipment: None  Intra-op Plan:   Post-operative Plan: Extubation in OR  Informed Consent: I have reviewed the patients History and Physical,  chart, labs and discussed the procedure including the risks, benefits and alternatives for the proposed anesthesia with the patient or authorized representative who has indicated his/her understanding and acceptance.     Dental advisory given  Plan Discussed with: CRNA  Anesthesia Plan Comments:        Anesthesia Quick Evaluation

## 2023-01-03 NOTE — Evaluation (Signed)
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 Pt had THA in 2008, revision 2017 and 2019. 2021 infection of L hip and I and D and on prophylactics ABX since that time. Pt has PMH of chronic pain with spinal cord stimulator (2018), DVT, L foot drop, HDL, prostate ca, R RTC repair, B CTR, and L THA.  Clinical Impression    [name] is a 77 y.o. male POD 0 s/p L hip I and D. Patient reports Mod I with mobility at baseline. Patient is now limited by functional impairments (see PT problem list below) and requires min guard for supine to sit, min A for B LE for sit to supine for bed mobility and cues and min guard for transfers. Patient was able to ambulate 120 feet with RW and min guard level of assist. Patient instructed in exercise to facilitate ROM and circulation to manage edema.Patient will benefit from continued skilled PT interventions to address impairments and progress towards PLOF. Acute PT will follow to progress mobility and stair training in preparation for safe discharge home.      Recommendations for follow up therapy are one component of a multi-disciplinary discharge planning process, led by the attending physician.  Recommendations may be updated based on patient status, additional functional criteria and insurance authorization.  Follow Up Recommendations       Assistance Recommended at Discharge Intermittent Supervision/Assistance  Patient can return home with the following  A little help with walking and/or transfers;A little help with bathing/dressing/bathroom;Assistance with cooking/housework;Assist for transportation;Help with stairs or ramp for entrance    Equipment Recommendations None recommended by PT (pt reports having DME in home setting)  Recommendations for  Other Services       Functional Status Assessment Patient has had a recent decline in their functional status and demonstrates the ability to make significant improvements in function in a reasonable and predictable amount of time.     Precautions / Restrictions Precautions Precautions: Fall Restrictions Weight Bearing Restrictions: Yes LLE Weight Bearing: Weight bearing as tolerated      Mobility  Bed Mobility Overal bed mobility: Needs Assistance Bed Mobility: Supine to Sit, Sit to Supine     Supine to sit: Min guard Sit to supine: Min assist (for LEs)        Transfers Overall transfer level: Needs assistance Equipment used: Rolling walker (2 wheels) Transfers: Sit to/from Stand Sit to Stand: Min guard           General transfer comment: cues for proper UE placement    Ambulation/Gait Ambulation/Gait assistance: Min guard Gait Distance (Feet): 120 Feet Assistive device: Rolling walker (2 wheels) Gait Pattern/deviations: Step-to pattern, Antalgic Gait velocity: decreased     General Gait Details: R knee limited flexion impacting foot clearance  Stairs            Wheelchair Mobility    Modified Rankin (Stroke Patients Only)       Balance Overall balance assessment: Needs assistance Sitting-balance support: Feet supported Sitting balance-Leahy Scale: Fair     Standing balance support: Bilateral upper extremity supported, During functional activity, Reliant on assistive device for balance Standing balance-Leahy Scale: Poor  Pertinent Vitals/Pain Pain Assessment Pain Assessment: 0-10 Pain Score: 4  Pain Location: L hip Pain Descriptors / Indicators: Constant, Discomfort Pain Intervention(s): Monitored during session, Limited activity within patient's tolerance, Ice applied, Premedicated before session    Home Living Family/patient expects to be discharged to:: Private residence Living  Arrangements: Spouse/significant other Available Help at Discharge: Family Type of Home: House Home Access: Ramped entrance     Alternate Level Stairs-Number of Steps: 6 Home Layout: Multi-level;Able to live on main level with bedroom/bathroom (split level home, pt states sleeps in recliner) Home Equipment: [place] (2 wheels);Cane - quad      Prior Function Prior Level of Function : Independent/Modified Independent;Driving             Mobility Comments: mod I with ADLs, self care tasks, IADLs, driving, SPC for mobility       Hand Dominance        Extremity/Trunk Assessment        Lower Extremity Assessment Lower Extremity Assessment: LLE deficits/detail LLE Deficits / Details: ankle DF/PF 5/5 LLE Sensation: WNL       Communication   Communication: No difficulties  Cognition Arousal/Alertness: Awake/alert Behavior During Therapy: WFL for tasks assessed/performed Overall Cognitive Status: Within Functional Limits for tasks assessed                                          General Comments      Exercises Total Joint Exercises Ankle Circles/Pumps: AROM, Both, 20 reps   Assessment/Plan    PT Assessment Patient needs continued PT services  PT Problem List Decreased strength;Decreased range of motion;Decreased activity tolerance;Decreased balance;Decreased mobility;Decreased coordination;Decreased cognition;Decreased knowledge of use of DME;Pain       PT Treatment Interventions DME instruction;Gait training;Functional mobility training;Therapeutic activities;Therapeutic exercise;Balance training;Neuromuscular re-education;Patient/family education;Modalities    PT Goals (Current goals can be found in the Care Plan section)  Acute Rehab PT Goals Patient Stated Goal: to have no more L hip pain and get rid of the infection PT Goal Formulation: With patient Time For Goal Achievement: 01/17/23 Potential to Achieve Goals: Good     Frequency 7X/week     Co-evaluation               AM-PAC PT "6 Clicks" Mobility  Outcome Measure Help needed turning from your back to your side while in a flat bed without using bedrails?: A Little Help needed moving from lying on your back to sitting on the side of a flat bed without using bedrails?: A Little Help needed moving to and from a bed to a chair (including a wheelchair)?: A Little Help needed standing up from a chair using your arms (e.g., wheelchair or bedside chair)?: A Little Help needed to walk in hospital room?: A Little Help needed climbing 3-5 steps with a railing? : Total 6 Click Score: 16    End of Session Equipment Utilized During Treatment: Gait belt Activity Tolerance: Patient tolerated treatment well;No increased pain Patient left: in bed;with call bell/phone within reach Nurse Communication: Mobility status PT Visit Diagnosis: Unsteadiness on feet (R26.81);Other abnormalities of gait and mobility (R26.89);Muscle weakness (generalized) (M62.81);Pain Pain - Right/Left: Left Pain - part of body: Hip    Time: [id number] PT Time Calculation (min) (ACUTE ONLY): 28 min   Charges:   PT Evaluation $PT Eval Low Complexity: 1 Low PT Treatments $Gait Training: 8-22 mins  Baird Lyons, PT   Adair Patter 01/03/2023, 4:42 PM

## 2023-01-03 NOTE — Progress Notes (Signed)
Pharmacy Antibiotic Note  [name] is a 77 y.o. male admitted on 01/03/2023 with chronically infected left hip s/p I&D 01/03/2023.  Pharmacy has been consulted for vancomycin dosing.  Plan: Vancomycin 1 gram IV x 1 given in OR at 1225 & vanc 1 gm powder used in I&D of L hip Vancomycin 1750 mg IV q24 for est AUC 591.5, Css min 12.5 using Scr 1.2, Vd 0.5. Wt 115.2 kg F/u renal function, WBC, temp, culture data   Temp (24hrs), Avg:97.9 F (36.6 C), Min:97.7 F (36.5 C), Max:98.2 F (36.8 C)  Recent Labs  Lab 01/02/23 0933  WBC 7.3  CREATININE 1.20    Estimated Creatinine Clearance: 67.5 mL/min (by C-G formula based on SCr of 1.2 mg/dL).    Allergies  Allergen Reactions   Adhesive [Tape] Rash    Paper tape is ok    Antimicrobials this admission: 3/29 vanc>> 3/23 cefazolin>> Dose adjustments this admission:  Microbiology results: 3/29 synovial, L hip OR cx: 3/28 MRSA -, MSSA -  Thank you for allowing pharmacy to be a part of this patient's care.  Eudelia Bunch, Pharm.D Use secure chat for questions 01/03/2023 2:37 PM

## 2023-01-03 NOTE — Plan of Care (Signed)

## 2023-01-03 NOTE — Progress Notes (Signed)
Patient ID: [name], male   DOB: [birth year], 77 y.o.   MRN: [id number] I decided to discontinue the vancomycin IV and switch the patient to Ancef 2 g every 8 hours IV.  His past Gram stain and cultures have all grown out sensitive staph which was susceptible to Ancef.  We will continue his Ancef and await the most recent culture results.

## 2023-01-03 NOTE — Interval H&P Note (Signed)
History and Physical Interval Note: Patient understands that he is here today for an irrigation debridement/incision and drainage on his chronically infected left hip.  He understands that we are only washing this out with the hopes of still trying to suppress the infection is much as possible and retain the implants.  He has had no acute changes in medical status since I saw him earlier this week.  See H&P.  The risks and benefits of surgery have been discussed in detail and informed consent is obtained.  The left operative hip has been marked.  01/03/2023 10:25 AM  [name]  has presented today for surgery, with the diagnosis of chronic left hip infection.  The various methods of treatment have been discussed with the patient and family. After consideration of risks, benefits and other options for treatment, the patient has consented to  Procedure(s): IRRIGATION AND DEBRIDEMENT LEFT HIP (Left) as a surgical intervention.  The patient's history has been reviewed, patient examined, no change in status, stable for surgery.  I have reviewed the patient's chart and labs.  Questions were answered to the patient's satisfaction.     Mcarthur Rossetti

## 2023-01-03 NOTE — Op Note (Signed)
Operative Note  Date of operation: 01/03/2023 Preoperative diagnosis: Chronic infection left total hip prosthesis Postoperative diagnosis: Same  Procedure: Irrigation and debridement of left hip including sharp excisional debridement of necrotic skin and fascia  Findings: Sinus tract of the left hip from the level of the skin tracking down to the joint with a large fluid collection around the left hip prosthesis  Surgeon: Lind Guest. 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  His original surgery was performed by one of my colleagues in 2008.  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  He underwent 2 separate revision surgeries of that acetabular component and was eventually noted to have an infection.  He has had hip ball and polyliner exchange in the past.  He has been on chronic oral Duricef and has been followed by Dr. Megan Salon of the ID clinic for many years.  The last time we performed an irrigation and debridement of his left hip was I believe in 2021.  We have followed him regularly twice a year and he presented to the office this week with a wound near the groin crease at the superior aspect of incision.  It just darted draining.  He denies any fever or chills and he denies any pain and has not felt sick.  I have recommended an irrigation debridement of all of the tissue surrounding the left hip prosthesis.  We talked in length in detail about the only way to truly eradicate infection would be to remove all components.  At this point he is 77 and his wife has new onset dementia.  He wants to continue the potential for retaining the implants with chronic suppression.  He understands the reason behind proceeding with  surgery today for thoroughly gating and debriding the tissue.  He understands that this will not eradicate his infection.  Procedure description: After informed consent was obtained appropriate left hip was marked, the patient was brought to the operating room and general anesthesia was obtained while he is on a stretcher.  He was then placed supine on the Hana fracture table with a perineal post in place in both legs and inline skeletal traction devices but no traction applied.  His left operative hip was red and draped with DuraPrep and sterile drapes.  A timeout was called he identified his correct patient correct left hip.  I first ellipsed out the small wound at the superior aspect of his incision there was obviously a sinus tract.  This was a sharp incisional debridement of the necrotic sinus tract including skin and fascia in this area.  I did take cultures of the tissue in this area.  We then irrigated the soft tissue with at least a liter of normal saline solution.  We then changed our gloves and instruments and went down to the deeper tissue.  Once open of the deeper tissue we did encounter a large fluid collection.  This was obviously involving infection as well.  We then used a # and scalpel and other instrumentation to thoroughly excisionally debride tissue around the implant.  This was just soft tissue that was excised.  We then irrigated 3 L normal saline solution through the deep and superficial tissue.  Once that was completed we then irrigated the tissue again  with an antibiotic solution and let that solution stay within the tissue for several minutes.  We then placed vancomycin powder deep around the arthrotomy and closed the deep tissue with interrupted #1 Ethibond suture.  The next layer was closed with #1 Vicryl followed by 0 Vicryl and 2-0 Vicryl to close the subcutaneous tissue.  The skin was reapproximated with interrupted nylon suture.  Xeroform and an Aquacel dressing was applied.  The  patient was taken off the Hana table, awakened and extubated.  He was taken to recovery in stable addition.  Postoperatively he will be admitted in order to have a PICC line placed and initiate at least 6 weeks of IV antibiotics.  I will also likely involve the ID service as well.

## 2023-01-03 NOTE — Anesthesia Postprocedure Evaluation (Signed)
Anesthesia Post Note  Patient: FINEAS TWEET  Procedure(s) Performed: IRRIGATION AND DEBRIDEMENT LEFT HIP (Left: Hip)     Patient location during evaluation: PACU Anesthesia Type: General Level of consciousness: awake and alert Pain management: pain level controlled Vital Signs Assessment: post-procedure vital signs reviewed and stable Respiratory status: spontaneous breathing, nonlabored ventilation and respiratory function stable Cardiovascular status: blood pressure returned to baseline and stable Postop Assessment: no apparent nausea or vomiting Anesthetic complications: no   No notable events documented.  Last Vitals:  Vitals:   01/03/23 1330 01/03/23 1345  BP: 127/74 126/69  Pulse: (!) 59 (!) 59  Resp: 15 15  Temp:    SpO2: 99% 100%    Last Pain:  Vitals:   01/03/23 1345  TempSrc:   PainSc: 2                  Vega Stare

## 2023-01-03 NOTE — Anesthesia Procedure Notes (Signed)
Procedure Name: LMA Insertion Date/Time: 01/03/2023 11:54 AM  Performed by: Randye Lobo, CRNAPre-anesthesia Checklist: Patient identified, Emergency Drugs available, Suction available and Patient being monitored Patient Re-evaluated:Patient Re-evaluated prior to induction Oxygen Delivery Method: Circle System Utilized Preoxygenation: Pre-oxygenation with 100% oxygen Induction Type: IV induction Ventilation: Mask ventilation without difficulty LMA: LMA inserted LMA Size: 4.0 Number of attempts: 1 Airway Equipment and Method: Bite block Placement Confirmation: positive ETCO2 Tube secured with: Tape Dental Injury: Teeth and Oropharynx as per pre-operative assessment

## 2023-01-04 ENCOUNTER — Encounter (HOSPITAL_COMMUNITY): Payer: Self-pay | Admitting: Orthopaedic Surgery

## 2023-01-04 LAB — CBC
HCT: 44.7 % (ref 39.0–52.0)
Hemoglobin: 14.3 g/dL (ref 13.0–17.0)
MCH: 29.8 pg (ref 26.0–34.0)
MCHC: 32 g/dL (ref 30.0–36.0)
MCV: 93.1 fL (ref 80.0–100.0)
Platelets: 237 10*3/uL (ref 150–400)
RBC: 4.8 MIL/uL (ref 4.22–5.81)
RDW: 13.9 % (ref 11.5–15.5)
WBC: 7 10*3/uL (ref 4.0–10.5)
nRBC: 0 % (ref 0.0–0.2)

## 2023-01-04 LAB — BASIC METABOLIC PANEL
Anion gap: 7 (ref 5–15)
BUN: 23 mg/dL (ref 8–23)
CO2: 23 mmol/L (ref 22–32)
Calcium: 9.5 mg/dL (ref 8.9–10.3)
Chloride: 106 mmol/L (ref 98–111)
Creatinine, Ser: 1.01 mg/dL (ref 0.61–1.24)
GFR, Estimated: >60 mL/min (ref >60)
Glucose, Bld: 137 mg/dL — ABNORMAL HIGH (ref 70–99)
Potassium: 4.5 mmol/L (ref 3.5–5.1)
Sodium: 136 mmol/L (ref 135–145)

## 2023-01-04 LAB — SEDIMENTATION RATE: Sed Rate: 5 mm/hr (ref 0–16)

## 2023-01-04 LAB — C-REACTIVE PROTEIN: CRP: 0.6 mg/dL (ref <1.0)

## 2023-01-04 MED ORDER — CEFAZOLIN SODIUM-DEXTROSE 2-4 GM/100ML-% IV SOLN
2.0000 g | Freq: Three times a day (TID) | INTRAVENOUS | Status: DC
  Administered 2023-01-04 – 2023-01-06 (×7): 2 g via INTRAVENOUS
  Filled 2023-01-04 (×7): qty 100

## 2023-01-04 NOTE — Plan of Care (Signed)

## 2023-01-04 NOTE — Progress Notes (Signed)
PICC order received, risks and benefits discussed with pt. Consent signed. PIV patent, home care/ infusion not yet arranged. Will plan to insert on 3/31.

## 2023-01-04 NOTE — Progress Notes (Signed)
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 Pt had THA in 2008, revision 2017 and 2019. 2021 infection of L hip and I and D and on prophylactics ABX since that time. Pt has PMH of chronic pain with spinal cord stimulator (2018), DVT, L foot drop, HDL, prostate ca, R RTC repair, B CTR, and L THA.    PT Comments    Pt continues very cooperative and up to ambulate in halls and up to bathroom for toileting.  Will attempt stairs in am.    Recommendations for follow up therapy are one component of a multi-disciplinary discharge planning process, led by the attending physician.  Recommendations may be updated based on patient status, additional functional criteria and insurance authorization.  Follow Up Recommendations       Assistance Recommended at Discharge Intermittent Supervision/Assistance  Patient can return home with the following A little help with walking and/or transfers;A little help with bathing/dressing/bathroom;Assistance with cooking/housework;Assist for transportation;Help with stairs or ramp for entrance   Equipment Recommendations  None recommended by PT    Recommendations for Other Services       Precautions / Restrictions Precautions Precautions: Fall Restrictions Weight Bearing Restrictions: No LLE Weight Bearing: Weight bearing as tolerated     Mobility  Bed Mobility Overal bed mobility: Needs Assistance Bed Mobility: Supine to Sit     Supine to sit: Supervision Sit to supine: Supervision   General bed mobility comments: No physical assist    Transfers Overall transfer level: Needs assistance Equipment used: Rolling walker (2 wheels) Transfers: Sit to/from Stand Sit to Stand: Min guard, Supervision           General transfer comment: cues for  proper UE placement    Ambulation/Gait Ambulation/Gait assistance: Min guard, Supervision Gait Distance (Feet): 200 Feet (and 15' twice to/from bathroom) Assistive device: Rolling walker (2 wheels) Gait Pattern/deviations: Step-to pattern, Step-through pattern, Decreased step length - right, Decreased step length - left, Shuffle Gait velocity: decreased     General Gait Details: min cues for posture and position from [employer]             Wheelchair Mobility    Modified Rankin (Stroke Patients Only)       Balance Overall balance assessment: Needs assistance Sitting-balance support: Feet supported Sitting balance-Leahy Scale: Good     Standing balance support: No upper extremity supported Standing balance-Leahy Scale: Fair                              Cognition Arousal/Alertness: Awake/alert Behavior During Therapy: WFL for tasks assessed/performed Overall Cognitive Status: Within Functional Limits for tasks assessed                                          Exercises Total Joint Exercises Ankle Circles/Pumps: AROM, Both, 20 reps    General Comments        Pertinent Vitals/Pain Pain Assessment Pain Assessment: 0-10 Pain Score: 4  Pain Location: L hip Pain Descriptors / Indicators: Constant, Discomfort Pain Intervention(s): Limited activity within patient's tolerance, Monitored during session    Home Living  Prior Function            PT Goals (current goals can now be found in the care plan section) Acute Rehab PT Goals Patient Stated Goal: to have no more L hip pain and get rid of the infection PT Goal Formulation: With patient Time For Goal Achievement: 01/17/23 Potential to Achieve Goals: Good Progress towards PT goals: Progressing toward goals    Frequency    7X/week      PT Plan Current plan remains appropriate    Co-evaluation              AM-PAC PT "6  Clicks" Mobility   Outcome Measure  Help needed turning from your back to your side while in a flat bed without using bedrails?: A Little Help needed moving from lying on your back to sitting on the side of a flat bed without using bedrails?: A Little Help needed moving to and from a bed to a chair (including a wheelchair)?: A Little Help needed standing up from a chair using your arms (e.g., wheelchair or bedside chair)?: A Little Help needed to walk in hospital room?: A Little Help needed climbing 3-5 steps with a railing? : A Lot 6 Click Score: 17    End of Session Equipment Utilized During Treatment: Gait belt Activity Tolerance: Patient tolerated treatment well;No increased pain Patient left: with call bell/phone within reach;in chair Nurse Communication: Mobility status PT Visit Diagnosis: Unsteadiness on feet (R26.81);Other abnormalities of gait and mobility (R26.89);Muscle weakness (generalized) (M62.81);Pain Pain - Right/Left: Left Pain - part of body: Hip     Time: [id number] PT Time Calculation (min) (ACUTE ONLY): 19 min  Charges:  $Gait Training: 8-22 mins                     [place] Pager [phone] Office [phone]    [place] 01/04/2023, 5:18 PM

## 2023-01-04 NOTE — Plan of Care (Signed)
  Problem: Activity: Goal: Risk for activity intolerance will decrease Outcome: Progressing   Problem: Coping: Goal: Level of anxiety will decrease Outcome: Progressing   Problem: Pain Managment: Goal: General experience of comfort will improve Outcome: Progressing   

## 2023-01-04 NOTE — Progress Notes (Signed)
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 Pt had THA in 2008, revision 2017 and 2019. 2021 infection of L hip and I and D and on prophylactics ABX since that time. Pt has PMH of chronic pain with spinal cord stimulator (2018), DVT, L foot drop, HDL, prostate ca, R RTC repair, B CTR, and L THA.    PT Comments    Pt motivated and progressing well with mobility.  Pt to side of bed unassisted for use of urinal and to standing with RW to allow RN to change saturated dressing before ambulating increased distance in hall and progressing to reciprocal gait.   Recommendations for follow up therapy are one component of a multi-disciplinary discharge planning process, led by the attending physician.  Recommendations may be updated based on patient status, additional functional criteria and insurance authorization.  Follow Up Recommendations       Assistance Recommended at Discharge Intermittent Supervision/Assistance  Patient can return home with the following A little help with walking and/or transfers;A little help with bathing/dressing/bathroom;Assistance with cooking/housework;Assist for transportation;Help with stairs or ramp for entrance   Equipment Recommendations  None recommended by PT    Recommendations for Other Services       Precautions / Restrictions Precautions Precautions: Fall Restrictions Weight Bearing Restrictions: No LLE Weight Bearing: Weight bearing as tolerated     Mobility  Bed Mobility Overal bed mobility: Needs Assistance Bed Mobility: Supine to Sit, Sit to Supine     Supine to sit: Supervision Sit to supine: Supervision   General bed mobility comments: No physical assist    Transfers Overall transfer level: Needs assistance Equipment used: Rolling  walker (2 wheels) Transfers: Sit to/from Stand Sit to Stand: Min guard           General transfer comment: cues for proper UE placement    Ambulation/Gait Ambulation/Gait assistance: Min guard Gait Distance (Feet): 200 Feet Assistive device: Rolling walker (2 wheels) Gait Pattern/deviations: Step-to pattern, Step-through pattern, Decreased step length - right, Decreased step length - left, Shuffle       General Gait Details: min cues for posture and position from [employer]             Wheelchair Mobility    Modified Rankin (Stroke Patients Only)       Balance Overall balance assessment: Needs assistance Sitting-balance support: Feet supported Sitting balance-Leahy Scale: Good     Standing balance support: No upper extremity supported Standing balance-Leahy Scale: Fair                              Cognition Arousal/Alertness: Awake/alert Behavior During Therapy: WFL for tasks assessed/performed Overall Cognitive Status: Within Functional Limits for tasks assessed                                          Exercises Total Joint Exercises Ankle Circles/Pumps: AROM, Both, 20 reps    General Comments        Pertinent Vitals/Pain Pain Assessment Pain Assessment: 0-10 Pain Score: 4  Pain Location: L hip Pain Descriptors / Indicators: Constant, Discomfort Pain Intervention(s): Limited activity within patient's tolerance,  Monitored during session, Premedicated before session, Ice applied    Home Living                          Prior Function            PT Goals (current goals can now be found in the care plan section) Acute Rehab PT Goals Patient Stated Goal: to have no more L hip pain and get rid of the infection PT Goal Formulation: With patient Time For Goal Achievement: 01/17/23 Potential to Achieve Goals: Good Progress towards PT goals: Progressing toward goals    Frequency    7X/week       PT Plan Current plan remains appropriate    Co-evaluation              AM-PAC PT "6 Clicks" Mobility   Outcome Measure  Help needed turning from your back to your side while in a flat bed without using bedrails?: A Little Help needed moving from lying on your back to sitting on the side of a flat bed without using bedrails?: A Little Help needed moving to and from a bed to a chair (including a wheelchair)?: A Little Help needed standing up from a chair using your arms (e.g., wheelchair or bedside chair)?: A Little Help needed to walk in hospital room?: A Little Help needed climbing 3-5 steps with a railing? : A Lot 6 Click Score: 17    End of Session Equipment Utilized During Treatment: Gait belt Activity Tolerance: Patient tolerated treatment well;No increased pain Patient left: in bed;with call bell/phone within reach;with family/visitor present Nurse Communication: Mobility status PT Visit Diagnosis: Unsteadiness on feet (R26.81);Other abnormalities of gait and mobility (R26.89);Muscle weakness (generalized) (M62.81);Pain Pain - Right/Left: Left Pain - part of body: Hip     Time: 0913-0940 PT Time Calculation (min) (ACUTE ONLY): 27 min  Charges:  $Gait Training: 8-22 mins $Therapeutic Activity: 8-22 mins                     Debe Coder PT Acute Rehabilitation Services Pager [phone] Office [phone]    [name] 01/04/2023, 11:51 AM

## 2023-01-04 NOTE — Progress Notes (Signed)
Patient ID: [name], male   DOB: 1945-12-21, 77 y.o.   MRN: [id number] The patient tolerated surgery well yesterday.  He understands that I did find a large fluid collection deep within his hip and that is certainly worrisome for continued chronic infection.  The Gram stain shows abundant white cells but no organisms.  I am not surprised by that given the fact that he has been on oral antibiotics for years.  I did review all of his previous cultures over the years and they all did grow out a sensitive staph and in the past he has been on IV Ancef.  On the positive side, his white blood cell count, his sed rate and his CRP are all normal.  My plan is to have him receive a PICC line and then 6 weeks of IV Ancef.  I am absolutely sending him home on 6 weeks of IV Ancef based on the interoperative findings and his past history.  He has been followed in the past by Dr. Megan Salon with the ID service.  I will get them involved as an outpatient.  He did have considerable drainage from his incision today and I expressed more seroma type of fluid which I believe is better to have this fluid expressed and sitting deep within his hip.  The incision itself looks good.  He feels good overall and is afebrile with stable vital signs.  My plan is for discharge to home Monday on 6 weeks of IV Ancef 2 g every 8 hours.  Hopefully the transitional care team and pharmacy can help set this up.  The patient and his family agree with this treatment plan.

## 2023-01-05 LAB — CBC
HCT: 43.6 % (ref 39.0–52.0)
Hemoglobin: 13.9 g/dL (ref 13.0–17.0)
MCH: 30.1 pg (ref 26.0–34.0)
MCHC: 31.9 g/dL (ref 30.0–36.0)
MCV: 94.4 fL (ref 80.0–100.0)
Platelets: 210 10*3/uL (ref 150–400)
RBC: 4.62 MIL/uL (ref 4.22–5.81)
RDW: 14.2 % (ref 11.5–15.5)
WBC: 6.4 10*3/uL (ref 4.0–10.5)
nRBC: 0 % (ref 0.0–0.2)

## 2023-01-05 MED ORDER — CHLORHEXIDINE GLUCONATE CLOTH 2 % EX PADS
6.0000 | MEDICATED_PAD | Freq: Every day | CUTANEOUS | Status: DC
  Administered 2023-01-05 – 2023-01-06 (×2): 6 via TOPICAL

## 2023-01-05 MED ORDER — SODIUM CHLORIDE 0.9% FLUSH: 10.0000 mL | INTRAVENOUS | Status: DC | PRN

## 2023-01-05 NOTE — Progress Notes (Signed)
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 Pt had THA in 2008, revision 2017 and 2019. 2021 infection of L hip and I and D and on prophylactics ABX since that time. Pt has PMH of chronic pain with spinal cord stimulator (2018), DVT, L foot drop, HDL, prostate ca, R RTC repair, B CTR, and L THA.    PT Comments    Pt continues motivated and progressing well with mobility.  Pt demonstrating Mod I for bed mobility, transfers and ambulation in hall with RW.  RN agreeable to pt up ad lib with RW as long as PICC line remains disconnected from IV pole.  Recommendations for follow up therapy are one component of a multi-disciplinary discharge planning process, led by the attending physician.  Recommendations may be updated based on patient status, additional functional criteria and insurance authorization.  Follow Up Recommendations       Assistance Recommended at Discharge Intermittent Supervision/Assistance  Patient can return home with the following A little help with walking and/or transfers;A little help with bathing/dressing/bathroom;Assistance with cooking/housework;Assist for transportation;Help with stairs or ramp for entrance   Equipment Recommendations  None recommended by PT    Recommendations for Other Services       Precautions / Restrictions Precautions Precautions: Fall Restrictions Weight Bearing Restrictions: No LLE Weight Bearing: Weight bearing as tolerated     Mobility  Bed Mobility Overal bed mobility: Modified Independent             General bed mobility comments: No physical assist    Transfers Overall transfer level: Modified independent                 General transfer comment: No physical assist, pt self-cues for technique     Ambulation/Gait Ambulation/Gait assistance: Modified independent (Device/Increase time) Gait Distance (Feet): 370 Feet Assistive device: Rolling walker (2 wheels) Gait Pattern/deviations: Step-through pattern, Decreased step length - right, Decreased step length - left, Shuffle       General Gait Details: min cues for posture and position from RW   Stairs Stairs: Yes Stairs assistance: Supervision Stair Management: One rail Left, Step to pattern, Forwards, With cane Number of Stairs: 3 General stair comments: in sequence cues   Wheelchair Mobility    Modified Rankin (Stroke Patients Only)       Balance Overall balance assessment: Needs assistance Sitting-balance support: Feet supported Sitting balance-Leahy Scale: Good     Standing balance support: No upper extremity supported Standing balance-Leahy Scale: Good                              Cognition Arousal/Alertness: Awake/alert Behavior During Therapy: WFL for tasks assessed/performed Overall Cognitive Status: Within Functional Limits for tasks assessed                                          Exercises      General Comments        Pertinent Vitals/Pain Pain Assessment Pain Assessment: 0-10 Pain Score: 3  Pain Location: L hip Pain Descriptors / Indicators: Aching, Sore Pain Intervention(s): Limited activity within patient's tolerance, Monitored during session, Ice applied  Home Living                          Prior Function            PT Goals (current goals can now be found in the care plan section) Acute Rehab PT Goals Patient Stated Goal: to have no more L hip pain and get rid of the infection PT Goal Formulation: With patient Time For Goal Achievement: 01/17/23 Potential to Achieve Goals: Good Progress towards PT goals: Progressing toward goals    Frequency    7X/week      PT Plan Current plan remains appropriate    Co-evaluation               AM-PAC PT "6 Clicks" Mobility   Outcome Measure  Help needed turning from your back to your side while in a flat bed without using bedrails?: None Help needed moving from lying on your back to sitting on the side of a flat bed without using bedrails?: None Help needed moving to and from a bed to a chair (including a wheelchair)?: None Help needed standing up from a chair using your arms (e.g., wheelchair or bedside chair)?: None Help needed to walk in hospital room?: None Help needed climbing 3-5 steps with a railing? : A Little 6 Click Score: 23    End of Session Equipment Utilized During Treatment: Gait belt Activity Tolerance: Patient tolerated treatment well;No increased pain Patient left: with call bell/phone within reach;in chair Nurse Communication: Mobility status PT Visit Diagnosis: Unsteadiness on feet (R26.81);Other abnormalities of gait and mobility (R26.89);Muscle weakness (generalized) (M62.81);Pain Pain - Right/Left: Left Pain - part of body: Hip     Time: [id number] PT Time Calculation (min) (ACUTE ONLY): 18 min  Charges:  $Gait Training: 8-22 mins                     Rose Pager [phone] Office [phone]    [facility] 01/05/2023, 12:39 PM

## 2023-01-05 NOTE — Progress Notes (Signed)
Peripherally Inserted Central Catheter Placement  The IV Nurse has discussed with the patient and/or persons authorized to consent for the patient, the purpose of this procedure and the potential benefits and risks involved with this procedure.  The benefits include less needle sticks, lab draws from the catheter, and the patient may be discharged home with the catheter. Risks include, but not limited to, infection, bleeding, blood clot (thrombus formation), and puncture of an artery; nerve damage and irregular heartbeat and possibility to perform a PICC exchange if needed/ordered by physician.  Alternatives to this procedure were also discussed.  Bard Power PICC patient education guide, fact sheet on infection prevention and patient information card has been provided to patient /or left at bedside.  Consent obtained on 01/04/23, discussed again pre-procedure.   PICC Placement Documentation  PICC Single Lumen [id number] Right Basilic 40 cm 0 cm (Active)  Indication for Insertion or Continuance of Line Home intravenous therapies (PICC only) 01/05/23 0818  Exposed Catheter (cm) 0 cm 01/05/23 0818  Site Assessment Clean, Dry, Intact 01/05/23 0818  Line Status Saline locked;Blood return noted 01/05/23 0818  Dressing Type Transparent;Securing device 01/05/23 0818  Dressing Status Antimicrobial disc in place;Clean, Dry, Intact 01/05/23 0818  Safety Lock Not Applicable [id number]  Line Care Connections checked and tightened 01/05/23 0818  Line Adjustment (NICU/IV Team Only) No 01/05/23 0818  Dressing Intervention New dressing 01/05/23 0818  Dressing Change Due 01/12/23 01/05/23 0818       Rosalio Macadamia Chenice 01/05/2023, 8:20 AM

## 2023-01-05 NOTE — Progress Notes (Signed)
Patient is a 77 year old male who is POD 2 s/p left hip irrigation and debridement by Dr. Ninfa Linden.  Reports that he feels well this morning.  Really does not have much in the way of pain.  Only taking pain medication at night to help with sleeping.  Not taking any significant pain medication throughout the day.  He denies any chest pain, shortness of breath, abdominal pain, calf pain.  No fevers or chills.  On exam, leg lengths are equal.  Palpable DP pulse.  No calf tenderness bilaterally.  Negative Homans' sign bilaterally.  No knee effusion noted.  Dressing is intact without gross blood or drainage.  Plan is to continue with physical therapy mobilization.  Likely discharge tomorrow after evaluation by Dr. Ninfa Linden.  His culture results are showing staph aureus.  He received his PICC line this morning.

## 2023-01-05 NOTE — Plan of Care (Signed)
  Problem: Education: Goal: Knowledge of General Education information will improve Description: Including pain rating scale, medication(s)/side effects and non-pharmacologic comfort measures Outcome: Progressing   Problem: Health Behavior/Discharge Planning: Goal: Ability to manage health-related needs will improve Outcome: Progressing   Problem: Pain Managment: Goal: General experience of comfort will improve Outcome: Progressing   

## 2023-01-05 NOTE — Plan of Care (Signed)
  Problem: Clinical Measurements: Goal: Will remain free from infection Outcome: Progressing   Problem: Activity: Goal: Risk for activity intolerance will decrease Outcome: Progressing   Problem: Elimination: Goal: Will not experience complications related to urinary retention Outcome: Progressing   

## 2023-01-06 DIAGNOSIS — T8452XD Infection and inflammatory reaction due to internal left hip prosthesis, subsequent encounter: Secondary | ICD-10-CM | POA: Diagnosis not present

## 2023-01-06 MED ORDER — OXYCODONE HCL 5 MG PO TABS: 5.0000 mg | ORAL_TABLET | Freq: Four times a day (QID) | ORAL | 0 refills | Status: DC | PRN

## 2023-01-06 MED ORDER — CEFAZOLIN IV (FOR PTA / DISCHARGE USE ONLY): 2.0000 g | Freq: Three times a day (TID) | INTRAVENOUS | 0 refills | Status: AC

## 2023-01-06 NOTE — Progress Notes (Signed)
Patient ID: [name], male   DOB: 15-Nov-1945, 77 y.o.   MRN: [id number] The patient is awake and alert with stable vital signs.  His labs were normal yesterday.  His left hip dressing is clean and dry.  The Gram stain from Friday did grow out Staph aureus.  Susceptibilities are pending.  Once we know these we can discharge him home on the appropriate antibiotics.  That can potentially happen this afternoon.  I will check on him later this morning.

## 2023-01-06 NOTE — Consult Note (Signed)
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 Last seen 05/08/22 in clinic. Chart mention 03/2020 had a short course of rifampin combination cefazolin during the most recent I&D prior to this admission.  S/p I&D 01/03/23 this admission. Cx mssa again (grew in 2017, 2019, and 2021). Operative finding showed sinus tract of the left hip down to the joint prosthesis   I am not optimistic in curing this or suppressing this given the history/operative finding  No role for rifampin in his case  We can try 4-6 weeks of cefazolin again and then transition to cefadroxil chronically and hope for the best   Plan: Discussed plan with Dr Ninfa Linden. Patient would like to keep hardware in place for now Cefazolin 6 weeks at most and transition to oral cefadroxil chronic suppression; potential to transition before the 6 weeks 3.   F/u info as below      OPAT Orders Discharge antibiotics to be given via PICC line Discharge antibiotics: Cefazolin  Duration: 6 wks  End Date: OPAT Order Details             .Outpatient Parenteral Antibiotic Therapy Consult  Until discontinued       Provider:  (Not yet assigned)  Question Answer Comment  Antibiotic Cefazolin (Ancef) IVPB   Indications for use Prosthetic joint infection   End Date 02/14/2023   Approving ID Provider's Name Dr. Johnny Bridge                   [facility] Care Per Protocol:  Home health RN for IV administration and teaching; PICC line care and labs.    Labs weekly while on IV antibiotics: _x_ CBC with differential __ BMP _x_ CMP _x_ CRP __ ESR __ Vancomycin trough __ CK  _x_ Please pull PIC at completion of IV antibiotics __ Please leave PIC in place until doctor has seen patient or been notified  Fax weekly labs to [phone]  Clinic Follow Up Appt: 4/11 @ 11am  @  [facility] [place], [place], [place] [address] Phone: [phone]    I spent more than 80 minute reviewing data/chart, and coordinating care and >50% direct face to face time providing counseling/discussing diagnostics/treatment plan with patient    ------------------------------------------------ Principal Problem:   Prosthetic hip infection, subsequent encounter Active Problems:   Left hip prosthetic joint infection    HPI: [name] is a 77 y.o. male admitted for recurrent left hip staph aureus pji  Patient followed by dr Megan Salon of rc[facility] and dr Ninfa Linden of orthopedics  He has been taking suppressive cefadroxil  He reported a fall recently and bruised left hip several weeks back and noted opened wound/discharge left hip groin  He was admitted due to concern relapse. Underwent I&D --> sinus tract visualized. Cx staph aureus Hardware retained as patient doesn't want removal although  advised by ortho of risk of treatment failure  He is feeling well today  He has no sign of sepsis otherwise    Family History  Problem Relation Age of Onset   Lung cancer Mother    Heart disease Father    Colon cancer Neg Hx    Esophageal cancer Neg Hx    Rectal cancer Neg Hx    Stomach cancer Neg Hx    Colon polyps Neg Hx     Social History   Tobacco Use   Smoking status: Never   Smokeless tobacco: Never  Vaping Use   Vaping Use: Never used  Substance Use Topics   Alcohol use: No   Drug use: No    Allergies   Allergen Reactions   Adhesive [Tape] Rash    Paper tape is ok    Review of Systems: ROS All Other ROS was negative, except mentioned above   Past Medical History:  Diagnosis Date   Anterior dislocation of right shoulder 09/2013   Reduced in ED under sedation.  Dislocated posteriorly 2015, ? partial RC tear, has f/u planned with Dr. Marlou Sa--? surgery? possible plan for 2016.   Blood donor, whole blood    Has donated approx 30 gallons in his lifetime   Chronic left hip pain    Severe DJD.  THA 2008.  Left acetabular revision in 2017 AND 2019.   Chronic pain syndrome    Candidate for spinal cord stimulator (03/2017)   Chronic renal insufficiency, stage II (mild)    Chronic venous insufficiency    +compression hose   DDD (degenerative disc disease), lumbar 2017   Dr. Noe Gens ortho.  He is now wearing a new leg brace.  Has had ESI and will get another.   Diverticulosis    +redundant colon   DVT (deep venous thrombosis) ([place]) 06/14/2016   provoked, bilateral LL's; in post-op setting s/p hip surgery.  Xarelto x 29mo at full dosing, then 6 more months at 1/2 dosing, then stopped xarelto.     Foot drop, left    s/p hip fracture 1969   Hx of adenomatous colonic polyps    polypectomy 2007; 2012; 2015; 05/2019; recall 60yrs   Hypercholesterolemia 02/2017   Recommended statin 02/10/17 but pt declined, wanted to do TLC trial first. Rec'd statin 04/2020   Osteoarthritis    knees and hips primarily   Post laminectomy syndrome    improved with spinal cord stim-->stim to be removed summer/fall 2019.   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 PSA 0.16 Feb 2020 urol f/u. [name] Aug 2021 urol. <0.015 02/2021.   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; 02/2020   MSSA; admitted for I&D 02/11/20->plan for longterm IV abx after   Urge incontinence    vesicare helpful        Scheduled Meds:  aspirin EC  81 mg Oral Daily   atorvastatin  20 mg Oral Daily   Chlorhexidine Gluconate Cloth  6 each Topical Daily   docusate sodium  100 mg Oral BID   fesoterodine  4 mg Oral Daily   Continuous Infusions:  sodium chloride 10 mL/hr at 01/05/23 1045    ceFAZolin (ANCEF) IV 2 g (01/06/23 0957)   methocarbamol (ROBAXIN) IV Stopped (01/03/23 1442)   PRN Meds:.acetaminophen, diphenhydrAMINE, HYDROmorphone (DILAUDID) injection, methocarbamol **OR** methocarbamol (ROBAXIN) IV, metoCLOPramide **OR** metoCLOPramide (REGLAN) injection, ondansetron **OR** ondansetron (ZOFRAN) IV, mouth rinse,  oxyCODONE, oxyCODONE, sodium chloride flush   OBJECTIVE: Blood pressure 139/83, pulse (!) 52, temperature 97.8 F (36.6 C), temperature source Oral, resp. rate 16, height 6' (1.829 m), weight 115.2 kg, SpO2 99 %.  Physical Exam  General/constitutional: no distress, pleasant HEENT: Normocephalic, PER, Conj Clear, EOMI, Oropharynx clear Neck supple CV: rrr no mrg Lungs: clear to auscultation, normal respiratory effort Abd: Soft, Nontender Ext: no edema Skin: No Rash Neuro: nonfocal MSK: left hip dressing c/d/i   Lab Results Lab Results  Component Value Date   WBC 6.4 01/05/2023   HGB 13.9 01/05/2023   HCT 43.6 01/05/2023   MCV 94.4 01/05/2023   PLT 210 01/05/2023    Lab Results  Component Value Date   CREATININE 1.01 01/04/2023   BUN 23 01/04/2023   NA 136 01/04/2023   K 4.5 01/04/2023   CL 106 01/04/2023   CO2 23 01/04/2023    Lab Results  Component Value Date   ALT 7 05/02/2022   AST 11 05/02/2022   ALKPHOS 92 05/02/2022   BILITOT 0.4 05/02/2022      Microbiology: Recent Results (from the past 240 hour(s))  Surgical pcr screen     Status: None   Collection Time: 01/02/23  9:33 AM   Specimen: Nasal Mucosa; Nasal Swab  Result Value Ref Range Status   MRSA, PCR NEGATIVE NEGATIVE Final   Staphylococcus aureus NEGATIVE NEGATIVE Final    Comment:  (NOTE) The Xpert SA Assay (FDA approved for NASAL specimens in patients 24 years of age and older), is one component of a comprehensive surveillance program. It is not intended to diagnose infection nor to guide or monitor treatment. Performed at [facility], [place] [address] [place], [place] [address]   Aerobic/Anaerobic Culture w Gram Stain (surgical/deep wound)     Status: None (Preliminary result)   Collection Time: 01/03/23 12:16 PM   Specimen: Synovial, Left Hip; Body Fluid  Result Value Ref Range Status   Specimen Description   Final    ABSCESS LT HIP Performed at [place] [address] [place], [place] [address]    Special Requests   Final    NONE Performed at [facility], [place] [address], [place], [place] [address]    Gram Stain   Final    ABUNDANT WBC PRESENT, PREDOMINANTLY PMN NO ORGANISMS SEEN    Culture   Final    RARE STAPHYLOCOCCUS AUREUS SUSCEPTIBILITIES TO FOLLOW Performed at [facility], [place] [address], [place], [place] [address]    Report Status PENDING  Incomplete     Serology:    Imaging: If present, new imagings (plain films, ct scans, and mri) have been personally visualized and interpreted; radiology reports have been reviewed. Decision making incorporated into the Impression / Recommendations.    Jabier Mutton, [place] for Infectious [place] [phone] pager    01/06/2023, 11:09 AM

## 2023-01-06 NOTE — TOC Transition Note (Signed)
Transition of Care [facility]) - CM/SW Discharge Note   Patient Details  Name: [name] MRN: [id number] Date of Birth: 1946-08-01  Transition of Care [facility]) CM/SW Contact:  Lennart Pall, LCSW Phone Number: 01/06/2023, 12:57 PM   Clinical Narrative:     Pt medically cleared for dc home today with IV abx.  Pt aware and agreeable with referrals to [facility] for IV abx coverage and [facility] for [facility] coverage (both have followed pt in the past) - referrals accepted.  Pt has needed DME at home.  No further TOC needs.  Final next level of care: [place] Barriers to Discharge: No Barriers Identified   Patient Goals and CMS Choice      Discharge Placement                         Discharge Plan and Services Additional resources added to the After Visit Summary for                  DME Arranged: N/A DME Agency: NA       HH Arranged: IV Antibiotics, RN HH Agency: [facility], [place] Date [facility] Agency Contacted: 01/06/23   Representative spoke with at [place]: [place];  [place]  Social Determinants of Health (SDOH) Interventions SDOH Screenings   Food Insecurity: No Food Insecurity (01/03/2023)  Housing: Low Risk  (01/03/2023)  Transportation Needs: No Transportation Needs (01/03/2023)  Utilities: Not At Risk (01/03/2023)  Alcohol Screen: Low Risk  (01/03/2022)  Depression (PHQ2-9): Low Risk  (01/03/2022)  Financial Resource Strain: Low Risk  (01/03/2022)  Physical Activity: Insufficiently Active (01/03/2022)  Social Connections: Socially Integrated (01/03/2022)  Stress: No Stress Concern Present (01/03/2022)  Tobacco Use: Low Risk  (01/04/2023)     Readmission Risk Interventions    01/06/2023   12:56 PM  Readmission Risk Prevention Plan  Post Dischage Appt Complete  Medication Screening Complete  Transportation Screening Complete

## 2023-01-06 NOTE — Discharge Summary (Signed)
Patient ID: [name] MRN: [id number] DOB/AGE: 1946-06-20 77 y.o.  Admit date: 01/03/2023 Discharge date: 01/06/2023  Admission Diagnoses:  Principal Problem:   Prosthetic hip infection, subsequent encounter Active Problems:   Left hip prosthetic joint infection   Discharge Diagnoses:  Same  Past Medical History:  Diagnosis Date   Anterior dislocation of right shoulder 09/2013   Reduced in ED under sedation.  Dislocated posteriorly 2015, ? partial RC tear, has f/u planned with Dr. Marlou Sa--? surgery? possible plan for 2016.   Blood donor, whole blood    Has donated approx 30 gallons in his lifetime   Chronic left hip pain    Severe DJD.  THA 2008.  Left acetabular revision in 2017 AND 2019.   Chronic pain syndrome    Candidate for spinal cord stimulator (03/2017)   Chronic renal insufficiency, stage II (mild)    Chronic venous insufficiency    +compression hose   DDD (degenerative disc disease), lumbar 2017   Dr. Noe Gens ortho.  He is now wearing a new leg brace.  Has had ESI and will get another.   Diverticulosis    +redundant colon   DVT (deep venous thrombosis) ([place]) 06/14/2016   provoked, bilateral LL's; in post-op setting s/p hip surgery.  Xarelto x 68mo at full dosing, then 6 more months at 1/2 dosing, then stopped xarelto.     Foot drop, left    s/p hip fracture 1969   Hx of adenomatous colonic polyps    polypectomy 2007; 2012; 2015; 05/2019; recall 66yrs   Hypercholesterolemia 02/2017   Recommended statin 02/10/17 but pt declined, wanted to do TLC trial first. Rec'd statin 04/2020   Osteoarthritis    knees and hips primarily   Post laminectomy syndrome    improved with spinal cord stim-->stim to be removed summer/fall 2019.   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 PSA 0.16 Feb 2020 urol f/u. [name] Aug 2021 urol. <0.015 02/2021.    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 male who was admitted 01/03/2023 for operative treatment ofProsthetic hip infection, subsequent encounter. Patient has severe unremitting pain that affects sleep, daily activities, and work/hobbies. After pre-op clearance the patient was taken to the operating room on 01/03/2023 and underwent  Procedure(s): IRRIGATION AND DEBRIDEMENT LEFT HIP.    Patient was given perioperative antibiotics:  Anti-infectives (From admission, onward)    Start     Dose/Rate Route Frequency Ordered Stop   01/06/23 0000  ceFAZolin (ANCEF) IVPB        2 g Intravenous Every 8 hours 01/06/23 1204 02/14/23 2359   01/04/23 1000  ceFAZolin (ANCEF) IVPB 2g/100 mL premix        2 g 200 mL/hr over 30 Minutes Intravenous Every 8 hours 01/04/23 0813     01/03/23 1800  vancomycin (VANCOREADY) IVPB 1750 mg/350 mL  Status:  Discontinued        1,750 mg 175 mL/hr over 120 Minutes Intravenous Every 24 hours 01/03/23 1453 01/03/23 1652   01/03/23 1745  ceFAZolin (ANCEF) IVPB 2g/100 mL premix  Status:  Discontinued  2 g 200 mL/hr over 30 Minutes Intravenous Every 8 hours 01/03/23 1652 01/04/23 0813   01/03/23 1530  vancomycin (VANCOREADY) IVPB 2000 mg/400 mL  Status:  Discontinued        2,000 mg 200 mL/hr over 120 Minutes Intravenous  Once 01/03/23 1435 01/03/23 1438   01/03/23 1234  vancomycin (VANCOCIN) powder  Status:  Discontinued          As needed 01/03/23 1234 01/03/23 1424   01/03/23 1213  vancomycin (VANCOCIN) 1-5 GM/200ML-% IVPB       Note to Pharmacy: Otila Back M: cabinet override      01/03/23 1213 01/03/23 1444   01/03/23 0945  ceFAZolin (ANCEF) IVPB 2g/100 mL premix         2 g 200 mL/hr over 30 Minutes Intravenous On call to O.R. 01/03/23 [id number] 01/03/23 1215        Patient was given sequential compression devices, early ambulation, and chemoprophylaxis to prevent DVT.  Patient benefited maximally from hospital stay and there were no complications.    Recent vital signs: Patient Vitals for the past 24 hrs:  BP Temp Temp src Pulse Resp SpO2  01/06/23 0548 139/83 97.8 F (36.6 C) Oral (!) 52 16 99 %  01/05/23 2034 130/63 98.5 F (36.9 C) Oral (!) 59 18 97 %  01/05/23 1405 124/67 98 F (36.7 C) Oral (!) 56 20 99 %     Recent laboratory studies:  Recent Labs    01/04/23 0341 01/05/23 0328  WBC 7.0 6.4  HGB 14.3 13.9  HCT 44.7 43.6  PLT 237 210  NA 136  --   K 4.5  --   CL 106  --   CO2 23  --   BUN 23  --   CREATININE 1.01  --   GLUCOSE 137*  --   CALCIUM 9.5  --      Discharge Medications:   Allergies as of 01/06/2023       Reactions   Adhesive [tape] Rash   Paper tape is ok        Medication List     TAKE these medications    acetaminophen 500 MG tablet Commonly known as: TYLENOL Take 1,000 mg by mouth 2 (two) times daily as needed for moderate pain or headache.   aspirin EC 81 MG tablet Take 81 mg by mouth daily.   atorvastatin 20 MG tablet Commonly known as: LIPITOR Take 1 tablet (20 mg total) by mouth daily.   CALCIUM + D3 PO Take 600 mg by mouth daily.   cefadroxil 500 MG capsule Commonly known as: DURICEF Take 1 capsule (500 mg total) by mouth 2 (two) times daily.   ceFAZolin  IVPB Commonly known as: ANCEF Inject 2 g into the vein every 8 (eight) hours. Indication:  prosthetic joint infection  First Dose: Yes Last Day of Therapy:  02/14/23 Labs - Once weekly:  CBC/D and BMP, Labs - Every other week:  ESR and CRP Method of administration: IV Push Method of administration may be changed at the discretion of home infusion pharmacist based upon assessment of the patient and/or caregiver's ability to  self-administer the medication ordered.   oxyCODONE 5 MG immediate release tablet Commonly known as: Oxy IR/ROXICODONE Take 1 tablet (5 mg total) by mouth every 6 (six) hours as needed for moderate pain (pain score 4-6).   solifenacin 5 MG tablet Commonly known as: VESICARE Take 5 mg by mouth daily.  Discharge Care Instructions  (From admission, onward)           Start     Ordered   01/06/23 0000  Change dressing on IV access line weekly and PRN  (Home infusion instructions - [facility] )        01/06/23 1204            Diagnostic Studies: [place] EKG SITE RITE  Result Date: 01/03/2023 If Site Rite image not attached, placement could not be confirmed due to current cardiac rhythm.   Disposition: Discharge disposition: 01-Home or Self Care       Discharge Instructions     [facility] pharmacist to adjust dose for Vancomycin, Aminoglycosides and other anti-infective therapies as requested by physician.   Complete by: As directed    [facility] to provide Cath Flo 2mg    Complete by: As directed    Administer for PICC line occlusion and as ordered by physician for other access device issues.   Anaphylaxis Kit: Provided to treat any anaphylactic reaction to the medication being provided to the patient if First Dose or when requested by physician   Complete by: As directed    Epinephrine 1mg /ml vial / amp: Administer 0.3mg  (0.6ml) subcutaneously once for moderate to severe anaphylaxis, nurse to call physician and pharmacy when reaction occurs and call 911 if needed for immediate care   Diphenhydramine 50mg /ml IV vial: Administer 25-50mg  IV/IM PRN for first dose reaction, rash, itching, mild reaction, nurse to call physician and pharmacy when reaction occurs   Sodium Chloride 0.9% NS 514ml IV: Administer if needed for hypovolemic blood pressure drop or as ordered by physician after call to physician with anaphylactic reaction    Change dressing on IV access line weekly and PRN   Complete by: As directed    Flush IV access with Sodium Chloride 0.9% and Heparin 10 units/ml or 100 units/ml   Complete by: As directed    Home infusion instructions - [facility]   Complete by: As directed    Instructions: Flush IV access with Sodium Chloride 0.9% and Heparin 10units/ml or 100units/ml   Change dressing on IV access line: Weekly and PRN   Instructions Cath Flo 2mg : Administer for PICC Line occlusion and as ordered by physician for other access device   [facility] pharmacist to adjust dose for: Vancomycin, Aminoglycosides and other anti-infective therapies as requested by physician   Method of administration may be changed at the discretion of home infusion pharmacist based upon assessment of the patient and/or caregiver's ability to self-administer the medication ordered   Complete by: As directed           Signed: Mcarthur Rossetti 01/06/2023, 12:04 PM

## 2023-01-06 NOTE — Progress Notes (Addendum)
PHARMACY CONSULT NOTE FOR:  OUTPATIENT  PARENTERAL ANTIBIOTIC THERAPY (OPAT)  Indication: prosthetic joint infection Regimen: cefazolin 2g IV q8h End date: 02/14/23  IV antibiotic discharge orders are pended. To discharging provider:  please sign these orders via discharge navigator,  Select New Orders & click on the button choice - Manage This Unsigned Work.     Thank you for allowing pharmacy to be a part of this patient's care.  Eliseo Gum, PharmD PGY1 Pharmacy Resident   01/06/2023  8:38 AM

## 2023-01-06 NOTE — Plan of Care (Signed)

## 2023-01-06 NOTE — Progress Notes (Signed)
Mobility Specialist - Progress Note   01/06/23 0918  Mobility  Activity Ambulated with assistance in hallway;Ambulated with assistance to bathroom  Level of Assistance Modified independent, requires aide device or extra time  Assistive Device Front wheel walker  Distance Ambulated (ft) 240 ft  LLE Weight Bearing WBAT  Activity Response Tolerated well  Mobility Referral Yes  $Mobility charge 1 Mobility   Pt received in recliner and agreeable to mobility. After ambulating ~18ft pt requested to use bathroom but still eager to ambulate after using the bathroom. No complaints during session. Pt to recliner w/ ice on L side of hip after session & with all needs met.    [facility]

## 2023-01-07 ENCOUNTER — Telehealth: Payer: Self-pay

## 2023-01-07 DIAGNOSIS — Z86010 Personal history of colonic polyps: Secondary | ICD-10-CM | POA: Diagnosis not present

## 2023-01-07 DIAGNOSIS — Z6833 Body mass index (BMI) 33.0-33.9, adult: Secondary | ICD-10-CM | POA: Diagnosis not present

## 2023-01-07 DIAGNOSIS — Z9181 History of falling: Secondary | ICD-10-CM | POA: Diagnosis not present

## 2023-01-07 DIAGNOSIS — M48062 Spinal stenosis, lumbar region with neurogenic claudication: Secondary | ICD-10-CM | POA: Diagnosis not present

## 2023-01-07 DIAGNOSIS — E669 Obesity, unspecified: Secondary | ICD-10-CM | POA: Diagnosis not present

## 2023-01-07 DIAGNOSIS — N3941 Urge incontinence: Secondary | ICD-10-CM | POA: Diagnosis not present

## 2023-01-07 DIAGNOSIS — Z8546 Personal history of malignant neoplasm of prostate: Secondary | ICD-10-CM | POA: Diagnosis not present

## 2023-01-07 DIAGNOSIS — M21372 Foot drop, left foot: Secondary | ICD-10-CM | POA: Diagnosis not present

## 2023-01-07 DIAGNOSIS — N182 Chronic kidney disease, stage 2 (mild): Secondary | ICD-10-CM | POA: Diagnosis not present

## 2023-01-07 DIAGNOSIS — B9561 Methicillin susceptible Staphylococcus aureus infection as the cause of diseases classified elsewhere: Secondary | ICD-10-CM | POA: Diagnosis not present

## 2023-01-07 DIAGNOSIS — K5730 Diverticulosis of large intestine without perforation or abscess without bleeding: Secondary | ICD-10-CM | POA: Diagnosis not present

## 2023-01-07 DIAGNOSIS — I872 Venous insufficiency (chronic) (peripheral): Secondary | ICD-10-CM | POA: Diagnosis not present

## 2023-01-07 DIAGNOSIS — M1611 Unilateral primary osteoarthritis, right hip: Secondary | ICD-10-CM | POA: Diagnosis not present

## 2023-01-07 DIAGNOSIS — Z7982 Long term (current) use of aspirin: Secondary | ICD-10-CM | POA: Diagnosis not present

## 2023-01-07 DIAGNOSIS — Z86718 Personal history of other venous thrombosis and embolism: Secondary | ICD-10-CM | POA: Diagnosis not present

## 2023-01-07 DIAGNOSIS — T8452XA Infection and inflammatory reaction due to internal left hip prosthesis, initial encounter: Secondary | ICD-10-CM | POA: Diagnosis not present

## 2023-01-07 DIAGNOSIS — G629 Polyneuropathy, unspecified: Secondary | ICD-10-CM | POA: Diagnosis not present

## 2023-01-07 DIAGNOSIS — M5136 Other intervertebral disc degeneration, lumbar region: Secondary | ICD-10-CM | POA: Diagnosis not present

## 2023-01-07 DIAGNOSIS — G894 Chronic pain syndrome: Secondary | ICD-10-CM | POA: Diagnosis not present

## 2023-01-07 DIAGNOSIS — M170 Bilateral primary osteoarthritis of knee: Secondary | ICD-10-CM | POA: Diagnosis not present

## 2023-01-07 DIAGNOSIS — Z452 Encounter for adjustment and management of vascular access device: Secondary | ICD-10-CM | POA: Diagnosis not present

## 2023-01-07 DIAGNOSIS — Z792 Long term (current) use of antibiotics: Secondary | ICD-10-CM | POA: Diagnosis not present

## 2023-01-07 DIAGNOSIS — E7800 Pure hypercholesterolemia, unspecified: Secondary | ICD-10-CM | POA: Diagnosis not present

## 2023-01-07 NOTE — Transitions of Care (Post Inpatient/ED Visit) (Signed)
   01/07/2023  Name: [name] MRN: [id number] DOB: [birth year]  Today's TOC FU Call Status: Today's TOC FU Call Status:: Successful TOC FU Call Competed TOC FU Call Complete Date: 01/07/23  Transition Care Management Follow-up Telephone Call Date of Discharge: 01/06/23 Discharge Facility: [facility]) Type of Discharge: Inpatient Admission Primary Inpatient Discharge Diagnosis:: "prosthetic hip infection" How have you been since you were released from the hospital?: Better (Patient states he rested well last night. Pain has been controlled-only has taken Tylenol prn. Drsg has some bloody drainage on it-HH RN coming to change drsg today. Appetitei good-no BM since surgery-has OTC stool softener in home and will take some) Any questions or concerns?: No  Items Reviewed: Did you receive and understand the discharge instructions provided?: Yes Medications obtained and verified?: Yes (Medications Reviewed) Any new allergies since your discharge?: No Dietary orders reviewed?: Yes Type of Diet Ordered:: low salt/heart healthy Do you have support at home?: Yes People in Home: spouse Name of Support/Comfort Primary Source: [facility] and Equipment/Supplies: [facility] Ordered?: Yes Name of [place]:: [facility] Has Agency set up a time to come to your home?: Yes [place] Visit Date: 01/07/23 Any new equipment or medical supplies ordered?: Yes Name of Medical supply agency?: [facility] via PICC line Were you able to get the equipment/medical supplies?: Yes Do you have any questions related to the use of the equipment/supplies?: No  Functional Questionnaire: Do you need assistance with bathing/showering or dressing?: No Do you need assistance with meal preparation?: No Do you need assistance with eating?: No Do you have difficulty maintaining continence: No Do you need assistance with getting out of bed/getting out of a chair/moving?:  No Do you have difficulty managing or taking your medications?: No  Follow up appointments reviewed: PCP Follow-up appointment confirmed?: NA Specialist Hospital Follow-up appointment confirmed?: Yes Date of Specialist follow-up appointment?: 01/16/23 Follow-Up Specialty Provider:: Dr. Davy Pique, Dr. Blackman-01/16/23-1:15pm Do you need transportation to your follow-up appointment?: No Do you understand care options if your condition(s) worsen?: Yes-patient verbalized understanding  SDOH Interventions Today    Flowsheet Row Most Recent Value  SDOH Interventions   Food Insecurity Interventions Intervention Not Indicated  Transportation Interventions Intervention Not Indicated      TOC Interventions Today    Flowsheet Row Most Recent Value  TOC Interventions   TOC Interventions Discussed/Reviewed TOC Interventions Discussed, Post discharge activity limitations per provider, S/S of infection, Post op wound/incision care      Interventions Today    Flowsheet Row Most Recent Value  General Interventions   General Interventions Discussed/Reviewed General Interventions Discussed, Doctor Visits  Doctor Visits Discussed/Reviewed Doctor Visits Discussed, Specialist  PCP/Specialist Visits Compliance with follow-up visit  Education Interventions   Education Provided Provided Education  Provided Verbal Education On Nutrition, Medication, When to see the doctor, Other  [pain mgmt, bowel regimen]  Nutrition Interventions   Nutrition Discussed/Reviewed Nutrition Discussed, Adding fruits and vegetables  Pharmacy Interventions   Pharmacy Dicussed/Reviewed Pharmacy Topics Discussed, Medications and their functions  Safety Interventions   Safety Discussed/Reviewed Safety Discussed, Home Safety        Hetty Blend [facility] Care Management Care Management Community Coordinator Direct Phone: [phone] Toll Free: [phone] Fax: [phone]

## 2023-01-07 NOTE — Telephone Encounter (Signed)
Sarah, RN with [facility] was calling to see if patient's dressing could be changed.  Stated that she didn't have any orders, but would like a verbal to change dressing due to Aquacel dressing being saturated. Pateint had left hip surgery on 01/03/23. Cb# [phone].  Please advise.

## 2023-01-07 NOTE — Telephone Encounter (Signed)
Verbal ok given to change bandage

## 2023-01-09 ENCOUNTER — Telehealth: Payer: Self-pay

## 2023-01-09 LAB — AEROBIC/ANAEROBIC CULTURE W GRAM STAIN (SURGICAL/DEEP WOUND)

## 2023-01-09 NOTE — Telephone Encounter (Signed)
Verbal order given  

## 2023-01-09 NOTE — Telephone Encounter (Signed)
Sarah with [facility] called stating that patient's dressing is saturated again and would like to know if dressing/bandage could be changed again or a recommendation for a different bandage due to the draining.  CB# [phone].  Please advise.  Thank you.

## 2023-01-10 DIAGNOSIS — T8131XA Disruption of external operation (surgical) wound, not elsewhere classified, initial encounter: Secondary | ICD-10-CM | POA: Diagnosis not present

## 2023-01-10 DIAGNOSIS — T8189XA Other complications of procedures, not elsewhere classified, initial encounter: Secondary | ICD-10-CM | POA: Diagnosis not present

## 2023-01-13 ENCOUNTER — Emergency Department (HOSPITAL_BASED_OUTPATIENT_CLINIC_OR_DEPARTMENT_OTHER)
Admission: EM | Admit: 2023-01-13 | Discharge: 2023-01-13 | Disposition: A | Discharge status: Home / Self Care | Payer: PPO | Attending: Emergency Medicine | Admitting: Emergency Medicine

## 2023-01-13 ENCOUNTER — Encounter (HOSPITAL_BASED_OUTPATIENT_CLINIC_OR_DEPARTMENT_OTHER): Payer: Self-pay

## 2023-01-13 DIAGNOSIS — T82898A Other specified complication of vascular prosthetic devices, implants and grafts, initial encounter: Secondary | ICD-10-CM | POA: Diagnosis not present

## 2023-01-13 DIAGNOSIS — T829XXA Unspecified complication of cardiac and vascular prosthetic device, implant and graft, initial encounter: Secondary | ICD-10-CM | POA: Insufficient documentation

## 2023-01-13 DIAGNOSIS — Z452 Encounter for adjustment and management of vascular access device: Secondary | ICD-10-CM | POA: Diagnosis not present

## 2023-01-13 DIAGNOSIS — Y712 Prosthetic and other implants, materials and accessory cardiovascular devices associated with adverse incidents: Secondary | ICD-10-CM | POA: Insufficient documentation

## 2023-01-13 DIAGNOSIS — Z7982 Long term (current) use of aspirin: Secondary | ICD-10-CM | POA: Diagnosis not present

## 2023-01-13 MED ORDER — HEPARIN SOD (PORK) LOCK FLUSH 100 UNIT/ML IV SOLN
250.0000 [IU] | INTRAVENOUS | Status: AC | PRN
  Administered 2023-01-13: 250 [IU]
  Filled 2023-01-13: qty 5

## 2023-01-13 NOTE — Discharge Instructions (Signed)
Go to [facility] is any further complications with picc line

## 2023-01-13 NOTE — ED Notes (Signed)
Patient present with PICC  States not able to flush at home.  Flushed with 20cc NS for good blood return.  Heparin placed as per protocol

## 2023-01-13 NOTE — ED Provider Notes (Signed)
[place] EMERGENCY DEPARTMENT AT [facility] Provider Note   CSN: [id number] Arrival date & time: 01/13/23  1425     History  Chief Complaint  Patient presents with   Vascular Access Problem    PICC line    [name] is a 77 y.o. male.  Patient complains of problems with PICC line in right arm.  Patient reports he was supposed to get antibiotics today at 2:00 and a another dosage this afternoon.  Home health nurse was unable to flush his PICC line and sent him here for evaluation.  Patient denies any other complaints he has not had any redness or swelling he denies any fever or chills.  Patient reports he tried to push his medication through but would not go  The history is provided by the patient. No language interpreter was used.       Home Medications Prior to Admission medications   Medication Sig Start Date End Date Taking? Authorizing Provider  acetaminophen (TYLENOL) 500 MG tablet Take 1,000 mg by mouth 2 (two) times daily as needed for moderate pain or headache.     [provider]  aspirin EC 81 MG tablet Take 81 mg by mouth daily.    [provider]  atorvastatin (LIPITOR) 20 MG tablet Take 1 tablet (20 mg total) by mouth daily. 05/02/22   McGowen, Maryjean Morn, MD  Calcium Carb-Cholecalciferol (CALCIUM + D3 PO) Take 600 mg by mouth daily.    [provider]  cefadroxil (DURICEF) 500 MG capsule Take 1 capsule (500 mg total) by mouth 2 (two) times daily. 05/08/22   Cliffton Asters, MD  ceFAZolin (ANCEF) IVPB Inject 2 g into the vein every 8 (eight) hours. Indication:  prosthetic joint infection  First Dose: Yes Last Day of Therapy:  02/14/23 Labs - Once weekly:  CBC/D and BMP, Labs - Every other week:  ESR and CRP Method of administration: IV Push Method of administration may be changed at the discretion of home infusion pharmacist based upon assessment of the patient and/or caregiver's ability to self-administer the medication  ordered. 01/06/23 02/14/23  Kathryne Hitch, MD  oxyCODONE (OXY IR/ROXICODONE) 5 MG immediate release tablet Take 1 tablet (5 mg total) by mouth every 6 (six) hours as needed for moderate pain (pain score 4-6). 01/06/23   Kathryne Hitch, MD  solifenacin (VESICARE) 5 MG tablet Take 5 mg by mouth daily. 03/09/21   [provider]      Allergies    Adhesive [tape]    Review of Systems   Review of Systems  All other systems reviewed and are negative.   Physical Exam Updated Vital Signs BP (!) 141/72 (BP Location: Left Arm)   Pulse 67   Temp 98.2 F (36.8 C) (Oral)   SpO2 97%  Physical Exam Vitals and nursing note reviewed.  Constitutional:      Appearance: He is well-developed.  HENT:     Head: Normocephalic.  Pulmonary:     Effort: Pulmonary effort is normal.  Abdominal:     General: There is no distension.  Musculoskeletal:        General: Normal range of motion.     Cervical back: Normal range of motion.     Comments: PICC line right upper arm no erythema no swelling.  Neurological:     Mental Status: He is alert and oriented to person, place, and time.     ED Results / Procedures / Treatments   Labs (all  labs ordered are listed, but only abnormal results are displayed) Labs Reviewed - No data to display  EKG None  Radiology No results found.  Procedures Procedures    Medications Ordered in ED Medications  heparin lock flush 100 unit/mL (250 Units Intracatheter Given 01/13/23 1529)    ED Course/ Medical Decision Making/ A&P                             Medical Decision Making Patient complains of PICC line not working and not being able to get his IV antibiotics  Risk Prescription drug management. Risk Details: Fortino Sicllen RN was able to flush PICC line and instilled heparin.  Patient is counseled on complications of PICC line he [facility] he is advised if he has any further complications with the IV to contact the  IV nurse team to be [facility].           Final Clinical Impression(s) / ED Diagnoses Final diagnoses:  Complication associated with peripherally inserted central catheter (PICC), initial encounter    Rx / DC Orders ED Discharge Orders     None      An After Visit Summary was printed and given to the patient.    Elson AreasSofia, Noelani Harbach K, PA-C 01/13/23 1602    Elayne SnareKingsley, Victoria K, DO 01/13/23 1605

## 2023-01-13 NOTE — ED Triage Notes (Signed)
Pt states he has a PICC line R arm, installed approx 1wk ago. States that yesterday, couldn't get it to flush. States [facility] nurse "tried everything" but no relief. Last "real use" was 24hrs ago. No pain, signs of infection, etc.

## 2023-01-15 DIAGNOSIS — T8452XA Infection and inflammatory reaction due to internal left hip prosthesis, initial encounter: Secondary | ICD-10-CM | POA: Diagnosis not present

## 2023-01-16 ENCOUNTER — Telehealth: Payer: Self-pay | Admitting: Orthopaedic Surgery

## 2023-01-16 ENCOUNTER — Telehealth: Payer: Self-pay

## 2023-01-16 ENCOUNTER — Encounter: Payer: Self-pay | Admitting: Orthopaedic Surgery

## 2023-01-16 ENCOUNTER — Encounter: Payer: Self-pay | Admitting: Internal Medicine

## 2023-01-16 ENCOUNTER — Ambulatory Visit (INDEPENDENT_AMBULATORY_CARE_PROVIDER_SITE_OTHER): Payer: PPO | Admitting: Internal Medicine

## 2023-01-16 ENCOUNTER — Other Ambulatory Visit: Payer: Self-pay

## 2023-01-16 ENCOUNTER — Ambulatory Visit (INDEPENDENT_AMBULATORY_CARE_PROVIDER_SITE_OTHER): Payer: PPO | Admitting: Orthopaedic Surgery

## 2023-01-16 VITALS — BP 139/71 | HR 66 | Temp 98.1°F | Resp 16 | Ht 72.0 in | Wt 265.0 lb

## 2023-01-16 DIAGNOSIS — T8450XD Infection and inflammatory reaction due to unspecified internal joint prosthesis, subsequent encounter: Secondary | ICD-10-CM

## 2023-01-16 DIAGNOSIS — B9561 Methicillin susceptible Staphylococcus aureus infection as the cause of diseases classified elsewhere: Secondary | ICD-10-CM

## 2023-01-16 DIAGNOSIS — T8452XA Infection and inflammatory reaction due to internal left hip prosthesis, initial encounter: Secondary | ICD-10-CM | POA: Diagnosis not present

## 2023-01-16 DIAGNOSIS — Z96642 Presence of left artificial hip joint: Secondary | ICD-10-CM

## 2023-01-16 DIAGNOSIS — M009 Pyogenic arthritis, unspecified: Secondary | ICD-10-CM

## 2023-01-16 NOTE — Progress Notes (Signed)
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   Pain in left hip 09/01/2017   History of revision of total replacement of left hip joint 09/01/2017   Encounter for surveillance of recalled total hip arthroplasty hardware 06/07/2016   Status post revision of total hip replacement 06/07/2016   Spinal stenosis, lumbar region, 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   Red blood cell abnormality 02/11/2016   Elevated blood pressure reading without diagnosis of hypertension 07/01/2013   Peripheral neuropathy 06/02/2013   Health maintenance examination 11/26/2011   Prostate cancer (HCC) 05/21/2010   Obesity, Class I, BMI 30-34.9 05/18/2010   Venous (peripheral) insufficiency 05/18/2010   Osteoarthrosis involving lower leg 05/18/2010      Subjective:    Patient ID: [name], male    DOB: 1946/01/26, 77 y.o.   MRN: [id number]  Chief Complaint  Patient presents with   Hospitalization Follow-up    Infection associated with internal left hip prosthesis       HPI:  [name] is a 77 y.o. 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 Here for id clinic f/u   01/16/23 id f/u Patient doing well Hip incision has one little spot still not closed yet. All sutures intact. There is a lot of serosanguinous drainage and he had to change pad 2-3 times a day depending on activity No pain along the incision or in  the groin.  He'll see dr Magnus Ivan today  He is taking also the cefadroxil together with cefazolin which he didn't know he not supposed to   Allergies  Allergen Reactions   Adhesive [Tape] Rash    Paper tape is ok      Outpatient Medications Prior to Visit  Medication Sig Dispense Refill   acetaminophen (TYLENOL) 500 MG tablet Take 1,000 mg by mouth 2 (two) times daily as needed for moderate pain or headache.      aspirin EC 81 MG tablet Take 81 mg by mouth daily.     atorvastatin (LIPITOR) 20 MG tablet Take 1 tablet (20 mg total) by mouth daily. 90 tablet 3   Calcium Carb-Cholecalciferol (CALCIUM + D3 PO) Take 600 mg by mouth daily.     cefadroxil (DURICEF) 500 MG capsule Take 1 capsule (500 mg total) by mouth 2 (two) times daily. 60 capsule 11   ceFAZolin (ANCEF) IVPB Inject 2 g into the vein every 8 (eight) hours. Indication:  prosthetic joint infection  First Dose: Yes Last Day of Therapy:  02/14/23 Labs - Once weekly:  CBC/D and BMP, Labs - Every other week:  ESR and CRP Method of administration: IV Push Method of administration may be changed at the discretion of home infusion pharmacist based upon assessment of the patient and/or caregiver's ability to self-administer the medication ordered. 117 Units 0   oxyCODONE (OXY IR/ROXICODONE) 5 MG immediate release tablet Take 1 tablet (5 mg total) by mouth every 6 (six) hours as needed for moderate pain (pain score 4-6).  30 tablet 0   solifenacin (VESICARE) 5 MG tablet Take 5 mg by mouth daily.     No facility-administered medications prior to visit.     Social History   Socioeconomic History   Marital status: Married    Spouse name: Not on file   Number of children: Not on file   Years of education: Not on file   Highest education level: Not on file  Occupational History   Not on file  Tobacco Use   Smoking status: Never   Smokeless tobacco: Never  Vaping Use   Vaping Use: Never used  Substance and Sexual Activity    Alcohol use: No   Drug use: No   Sexual activity: Not on file  Other Topics Concern   Not on file  Social History Narrative   Married, 2 biologic children, 2 adopted, 5 GC.   [place]  Worked 35 yrs at [employer].   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   No formal exercise.  No T/A/Ds.   Enjoys woodworking.   Social Determinants of Health   Financial Resource Strain: Low Risk  (01/03/2022)   Overall Financial Resource Strain (CARDIA)    Difficulty of Paying Living Expenses: Not very hard  Food Insecurity: No Food Insecurity (01/07/2023)   Hunger Vital Sign    Worried About Running Out of Food in the Last Year: Never true    Ran Out of Food in the Last Year: Never true  Transportation Needs: No Transportation Needs (01/07/2023)   PRAPARE - Administrator, Civil ServiceTransportation    Lack of Transportation (Medical): No    Lack of Transportation (Non-Medical): No  Physical Activity: Insufficiently Active (01/03/2022)   Exercise Vital Sign    Days of Exercise per Week: 7 days    Minutes of Exercise per Session: 20 min  Stress: No Stress Concern Present (01/03/2022)   [employer] - Occupational Stress Questionnaire    Feeling of Stress : Not at all  Social Connections: Socially Integrated (01/03/2022)   Social Connection and Isolation Panel [NHANES]    Frequency of Communication with Friends and Family: More than three times a week    Frequency of Social Gatherings with Friends and Family: More than three times a week    Attends Religious Services: More than 4 times per year    Active Member of [employer]ubs or Organizations: Yes    Attends Engineer, structuralClub or Organization Meetings: More than 4 times per year    Marital Status: Married  Catering managerntimate Partner Violence: Not At Risk (01/03/2023)   Humiliation, Afraid, Rape, and Kick questionnaire    Fear of Current or Ex-Partner: No    Emotionally Abused: No    Physically Abused: No    Sexually Abused: No      Review of  Systems     Objective:    BP 139/71   Pulse 66   Temp 98.1 F (36.7 C) (Oral)   Resp 16   Ht 6' (1.829 m)   Wt 265 lb (120.2 kg)   SpO2 99%   BMI 35.94 kg/m  Nursing note and vital signs reviewed.  Physical Exam     General/constitutional: no distress, pleasant HEENT: Normocephalic, PER, Conj Clear, EOMI, Oropharynx clear Neck supple CV: rrr no mrg Lungs: clear to auscultation, normal respiratory effort Abd: Soft, Nontender Ext: no edema Skin -- dressing slightly stained serosanguinous changes  Rue picc site no erythema/discharge/tenderness   Labs:  Micro:  Serology:  Imaging:  Assessment & Plan:   Problem List Items Addressed This Visit   None Visit Diagnoses     Infection of prosthetic joint, subsequent encounter    -  Primary         No orders of the defined types were placed in this encounter.    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 Here for id clinic f/u   He is followed by dr Orvan Falconer. Last seen 05/08/22 in clinic. Chart mention 03/2020 had a short course of rifampin combination cefazolin during the most recent I&D prior to this admission.   S/p I&D 01/03/23 this admission. Cx mssa again (grew in 2017, 2019, and 2021). Operative finding showed sinus tract of the left hip down to the joint prosthesis     I am not optimistic in curing this or suppressing this given the history/operative finding   No role for rifampin in his case   Had planned 6 weeks iv cefazolin then transitioned to indefinite suppression  on 02/14/23.   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      Raymondo Band, MD [facility] 01/16/2023, 11:02 AM

## 2023-01-16 NOTE — Telephone Encounter (Signed)
Patient was told to come back on the 18th for follow up, no appointments available so appointment made  for the 25th with Bronson Curb. Patient asking if that is ok?

## 2023-01-16 NOTE — Patient Instructions (Signed)
Please stop cefadroxil --> only take this when you are done with cefazolin on 02/14/23  See me 3 weeks after you start on the pill  The pill cefadroxil should be 1000 mg twice a day (if it comes in 500 mg tablet then 2 tablet twice a day)

## 2023-01-16 NOTE — Telephone Encounter (Signed)
Message sent to Jeri Modena, RN and [facility] staff. Per Dr.Vu PICC line will need to be pulled after last dose of IV abx on 02/14/2023. Routing to [employer] as well.    Attilio Zeitler Lesli Albee, CMA

## 2023-01-16 NOTE — Progress Notes (Signed)
The patient comes in today for his first follow-up status post an I&D of a chronic left hip infection.  This is of a prosthetic joint.  He does have a PICC line in place and is going through IV antibiotics.  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  He is followed by ID.  He reports that he is doing well.  His incision looks good but there are still some weeping from it.  I did aspirate about 40 cc of fluid and seroma from around the wound.  I would like to leave the suture in for 1 more week.  He can get these wet in the shower.  He will change the dressing as needed and place mupirocin ointment on the small area of the wound.  Will see him back next week for suture removal.  He agrees with this treatment plan.

## 2023-01-16 NOTE — Telephone Encounter (Signed)
Called and worked in for the 18th

## 2023-01-20 DIAGNOSIS — Z9181 History of falling: Secondary | ICD-10-CM | POA: Diagnosis not present

## 2023-01-20 DIAGNOSIS — T8452XA Infection and inflammatory reaction due to internal left hip prosthesis, initial encounter: Secondary | ICD-10-CM | POA: Diagnosis not present

## 2023-01-20 DIAGNOSIS — G894 Chronic pain syndrome: Secondary | ICD-10-CM | POA: Diagnosis not present

## 2023-01-20 DIAGNOSIS — N3941 Urge incontinence: Secondary | ICD-10-CM | POA: Diagnosis not present

## 2023-01-20 DIAGNOSIS — Z86010 Personal history of colonic polyps: Secondary | ICD-10-CM | POA: Diagnosis not present

## 2023-01-20 DIAGNOSIS — N182 Chronic kidney disease, stage 2 (mild): Secondary | ICD-10-CM | POA: Diagnosis not present

## 2023-01-20 DIAGNOSIS — M1611 Unilateral primary osteoarthritis, right hip: Secondary | ICD-10-CM | POA: Diagnosis not present

## 2023-01-20 DIAGNOSIS — M170 Bilateral primary osteoarthritis of knee: Secondary | ICD-10-CM | POA: Diagnosis not present

## 2023-01-20 DIAGNOSIS — E7800 Pure hypercholesterolemia, unspecified: Secondary | ICD-10-CM | POA: Diagnosis not present

## 2023-01-20 DIAGNOSIS — I872 Venous insufficiency (chronic) (peripheral): Secondary | ICD-10-CM | POA: Diagnosis not present

## 2023-01-20 DIAGNOSIS — G629 Polyneuropathy, unspecified: Secondary | ICD-10-CM | POA: Diagnosis not present

## 2023-01-20 DIAGNOSIS — Z452 Encounter for adjustment and management of vascular access device: Secondary | ICD-10-CM | POA: Diagnosis not present

## 2023-01-20 DIAGNOSIS — Z6833 Body mass index (BMI) 33.0-33.9, adult: Secondary | ICD-10-CM | POA: Diagnosis not present

## 2023-01-20 DIAGNOSIS — Z8546 Personal history of malignant neoplasm of prostate: Secondary | ICD-10-CM | POA: Diagnosis not present

## 2023-01-20 DIAGNOSIS — Z792 Long term (current) use of antibiotics: Secondary | ICD-10-CM | POA: Diagnosis not present

## 2023-01-20 DIAGNOSIS — M21372 Foot drop, left foot: Secondary | ICD-10-CM | POA: Diagnosis not present

## 2023-01-20 DIAGNOSIS — E669 Obesity, unspecified: Secondary | ICD-10-CM | POA: Diagnosis not present

## 2023-01-20 DIAGNOSIS — M48062 Spinal stenosis, lumbar region with neurogenic claudication: Secondary | ICD-10-CM | POA: Diagnosis not present

## 2023-01-20 DIAGNOSIS — Z7982 Long term (current) use of aspirin: Secondary | ICD-10-CM | POA: Diagnosis not present

## 2023-01-20 DIAGNOSIS — M5136 Other intervertebral disc degeneration, lumbar region: Secondary | ICD-10-CM | POA: Diagnosis not present

## 2023-01-20 DIAGNOSIS — B9561 Methicillin susceptible Staphylococcus aureus infection as the cause of diseases classified elsewhere: Secondary | ICD-10-CM | POA: Diagnosis not present

## 2023-01-20 DIAGNOSIS — Z86718 Personal history of other venous thrombosis and embolism: Secondary | ICD-10-CM | POA: Diagnosis not present

## 2023-01-20 DIAGNOSIS — K5730 Diverticulosis of large intestine without perforation or abscess without bleeding: Secondary | ICD-10-CM | POA: Diagnosis not present

## 2023-01-22 DIAGNOSIS — Z6833 Body mass index (BMI) 33.0-33.9, adult: Secondary | ICD-10-CM | POA: Diagnosis not present

## 2023-01-22 DIAGNOSIS — Z452 Encounter for adjustment and management of vascular access device: Secondary | ICD-10-CM | POA: Diagnosis not present

## 2023-01-22 DIAGNOSIS — M5136 Other intervertebral disc degeneration, lumbar region: Secondary | ICD-10-CM | POA: Diagnosis not present

## 2023-01-22 DIAGNOSIS — N3941 Urge incontinence: Secondary | ICD-10-CM | POA: Diagnosis not present

## 2023-01-22 DIAGNOSIS — E669 Obesity, unspecified: Secondary | ICD-10-CM | POA: Diagnosis not present

## 2023-01-22 DIAGNOSIS — B9561 Methicillin susceptible Staphylococcus aureus infection as the cause of diseases classified elsewhere: Secondary | ICD-10-CM | POA: Diagnosis not present

## 2023-01-22 DIAGNOSIS — Z7982 Long term (current) use of aspirin: Secondary | ICD-10-CM | POA: Diagnosis not present

## 2023-01-22 DIAGNOSIS — M48062 Spinal stenosis, lumbar region with neurogenic claudication: Secondary | ICD-10-CM | POA: Diagnosis not present

## 2023-01-22 DIAGNOSIS — I872 Venous insufficiency (chronic) (peripheral): Secondary | ICD-10-CM | POA: Diagnosis not present

## 2023-01-22 DIAGNOSIS — M21372 Foot drop, left foot: Secondary | ICD-10-CM | POA: Diagnosis not present

## 2023-01-22 DIAGNOSIS — M1611 Unilateral primary osteoarthritis, right hip: Secondary | ICD-10-CM | POA: Diagnosis not present

## 2023-01-22 DIAGNOSIS — Z86010 Personal history of colonic polyps: Secondary | ICD-10-CM | POA: Diagnosis not present

## 2023-01-22 DIAGNOSIS — Z86718 Personal history of other venous thrombosis and embolism: Secondary | ICD-10-CM | POA: Diagnosis not present

## 2023-01-22 DIAGNOSIS — E7800 Pure hypercholesterolemia, unspecified: Secondary | ICD-10-CM | POA: Diagnosis not present

## 2023-01-22 DIAGNOSIS — Z9181 History of falling: Secondary | ICD-10-CM | POA: Diagnosis not present

## 2023-01-22 DIAGNOSIS — M170 Bilateral primary osteoarthritis of knee: Secondary | ICD-10-CM | POA: Diagnosis not present

## 2023-01-22 DIAGNOSIS — G629 Polyneuropathy, unspecified: Secondary | ICD-10-CM | POA: Diagnosis not present

## 2023-01-22 DIAGNOSIS — G894 Chronic pain syndrome: Secondary | ICD-10-CM | POA: Diagnosis not present

## 2023-01-22 DIAGNOSIS — Z792 Long term (current) use of antibiotics: Secondary | ICD-10-CM | POA: Diagnosis not present

## 2023-01-22 DIAGNOSIS — K5730 Diverticulosis of large intestine without perforation or abscess without bleeding: Secondary | ICD-10-CM | POA: Diagnosis not present

## 2023-01-22 DIAGNOSIS — T8452XA Infection and inflammatory reaction due to internal left hip prosthesis, initial encounter: Secondary | ICD-10-CM | POA: Diagnosis not present

## 2023-01-22 DIAGNOSIS — N182 Chronic kidney disease, stage 2 (mild): Secondary | ICD-10-CM | POA: Diagnosis not present

## 2023-01-22 DIAGNOSIS — Z8546 Personal history of malignant neoplasm of prostate: Secondary | ICD-10-CM | POA: Diagnosis not present

## 2023-01-23 ENCOUNTER — Encounter: Payer: Self-pay | Admitting: Orthopaedic Surgery

## 2023-01-23 ENCOUNTER — Ambulatory Visit (INDEPENDENT_AMBULATORY_CARE_PROVIDER_SITE_OTHER): Payer: PPO | Admitting: Orthopaedic Surgery

## 2023-01-23 DIAGNOSIS — Z96642 Presence of left artificial hip joint: Secondary | ICD-10-CM

## 2023-01-23 DIAGNOSIS — M009 Pyogenic arthritis, unspecified: Secondary | ICD-10-CM

## 2023-01-23 NOTE — Progress Notes (Signed)
The patient continue to follow-up status post an irrigation and debridement of a chronically infected left total hip arthroplasty.  He says he is feeling fine and doing well overall.  We did remove all the sutures today.  He is 3 weeks out from the surgery tomorrow.  There is a small wound at the central aspect that we will need ointment placed on the wound daily in order to get this to heal.  He can get it wet in the shower daily basis as well.  Steri-Strips were otherwise applied around this.  He is continue to follow-up with the ID service.  From my standpoint we will see him back in a month to see how he is doing overall but no x-rays are needed.  If he does have issues before then he knows to let us know.

## 2023-01-27 DIAGNOSIS — B9561 Methicillin susceptible Staphylococcus aureus infection as the cause of diseases classified elsewhere: Secondary | ICD-10-CM | POA: Diagnosis not present

## 2023-01-27 DIAGNOSIS — T8452XA Infection and inflammatory reaction due to internal left hip prosthesis, initial encounter: Secondary | ICD-10-CM | POA: Diagnosis not present

## 2023-01-27 DIAGNOSIS — Z792 Long term (current) use of antibiotics: Secondary | ICD-10-CM | POA: Diagnosis not present

## 2023-01-27 DIAGNOSIS — Z452 Encounter for adjustment and management of vascular access device: Secondary | ICD-10-CM | POA: Diagnosis not present

## 2023-01-29 DIAGNOSIS — T8131XA Disruption of external operation (surgical) wound, not elsewhere classified, initial encounter: Secondary | ICD-10-CM | POA: Diagnosis not present

## 2023-01-29 DIAGNOSIS — T8452XA Infection and inflammatory reaction due to internal left hip prosthesis, initial encounter: Secondary | ICD-10-CM | POA: Diagnosis not present

## 2023-01-29 DIAGNOSIS — T8189XA Other complications of procedures, not elsewhere classified, initial encounter: Secondary | ICD-10-CM | POA: Diagnosis not present

## 2023-01-30 ENCOUNTER — Encounter: Payer: PPO | Admitting: Physician Assistant

## 2023-02-03 ENCOUNTER — Telehealth: Payer: Self-pay

## 2023-02-03 DIAGNOSIS — T8452XA Infection and inflammatory reaction due to internal left hip prosthesis, initial encounter: Secondary | ICD-10-CM | POA: Diagnosis not present

## 2023-02-03 DIAGNOSIS — B9561 Methicillin susceptible Staphylococcus aureus infection as the cause of diseases classified elsewhere: Secondary | ICD-10-CM | POA: Diagnosis not present

## 2023-02-03 DIAGNOSIS — Z452 Encounter for adjustment and management of vascular access device: Secondary | ICD-10-CM | POA: Diagnosis not present

## 2023-02-03 DIAGNOSIS — Z792 Long term (current) use of antibiotics: Secondary | ICD-10-CM | POA: Diagnosis not present

## 2023-02-03 NOTE — Telephone Encounter (Signed)
[name] with [facility] called to confirm end date of IV abx for the patient. Per Dr. Renold Don last OV patient will end IV abx on 02/14/23 and picc can be removed. I have advised Maralyn Sago of these orders and sent a message to [facility] to have them forward the orders to [place].   Maralyn Sago also stated patient has struggled at time infusing his IV abx and has to reposition he arm at times. Maralyn Sago stated she discussed cath flo with the patient, but patient would like to hold off on cath flo at this time.  Vane Yapp T Pricilla Loveless

## 2023-02-05 ENCOUNTER — Telehealth: Payer: Self-pay | Admitting: Family Medicine

## 2023-02-05 DIAGNOSIS — T8131XA Disruption of external operation (surgical) wound, not elsewhere classified, initial encounter: Secondary | ICD-10-CM | POA: Diagnosis not present

## 2023-02-05 DIAGNOSIS — T8189XA Other complications of procedures, not elsewhere classified, initial encounter: Secondary | ICD-10-CM | POA: Diagnosis not present

## 2023-02-05 NOTE — Telephone Encounter (Signed)
Called patient to schedule Medicare Annual Wellness Visit (AWV). Left message for patient to call back and schedule Medicare Annual Wellness Visit (AWV).  Last date of AWV: 01/03/2022  Please schedule an appointment at any time with Please schedule an appointment  any Wednesday as Tele/Video visits with Inetta Fermo, NHA. Please schedule AWVS with Inetta Fermo, NHA [place].  Please schedule as video/tele visit only, Wednesdays only..  If any questions, please contact me at [phone].  Thank you ,  Gabriel Cirri [facility] AWV TEAM Direct Dial [phone]

## 2023-02-05 NOTE — Telephone Encounter (Signed)
Contacted [name] to schedule their annual wellness visit. Appointment made for 02/12/2023.  Gabriel Cirri [facility] AWV TEAM Direct Dial [phone]

## 2023-02-06 DIAGNOSIS — T8452XA Infection and inflammatory reaction due to internal left hip prosthesis, initial encounter: Secondary | ICD-10-CM | POA: Diagnosis not present

## 2023-02-10 DIAGNOSIS — G894 Chronic pain syndrome: Secondary | ICD-10-CM | POA: Diagnosis not present

## 2023-02-10 DIAGNOSIS — K5730 Diverticulosis of large intestine without perforation or abscess without bleeding: Secondary | ICD-10-CM | POA: Diagnosis not present

## 2023-02-10 DIAGNOSIS — Z6833 Body mass index (BMI) 33.0-33.9, adult: Secondary | ICD-10-CM | POA: Diagnosis not present

## 2023-02-10 DIAGNOSIS — E7800 Pure hypercholesterolemia, unspecified: Secondary | ICD-10-CM | POA: Diagnosis not present

## 2023-02-10 DIAGNOSIS — M21372 Foot drop, left foot: Secondary | ICD-10-CM | POA: Diagnosis not present

## 2023-02-10 DIAGNOSIS — E669 Obesity, unspecified: Secondary | ICD-10-CM | POA: Diagnosis not present

## 2023-02-10 DIAGNOSIS — M1611 Unilateral primary osteoarthritis, right hip: Secondary | ICD-10-CM | POA: Diagnosis not present

## 2023-02-10 DIAGNOSIS — M48062 Spinal stenosis, lumbar region with neurogenic claudication: Secondary | ICD-10-CM | POA: Diagnosis not present

## 2023-02-10 DIAGNOSIS — Z86010 Personal history of colonic polyps: Secondary | ICD-10-CM | POA: Diagnosis not present

## 2023-02-10 DIAGNOSIS — I872 Venous insufficiency (chronic) (peripheral): Secondary | ICD-10-CM | POA: Diagnosis not present

## 2023-02-10 DIAGNOSIS — M5136 Other intervertebral disc degeneration, lumbar region: Secondary | ICD-10-CM | POA: Diagnosis not present

## 2023-02-10 DIAGNOSIS — G629 Polyneuropathy, unspecified: Secondary | ICD-10-CM | POA: Diagnosis not present

## 2023-02-10 DIAGNOSIS — M170 Bilateral primary osteoarthritis of knee: Secondary | ICD-10-CM | POA: Diagnosis not present

## 2023-02-10 DIAGNOSIS — Z452 Encounter for adjustment and management of vascular access device: Secondary | ICD-10-CM | POA: Diagnosis not present

## 2023-02-10 DIAGNOSIS — Z8546 Personal history of malignant neoplasm of prostate: Secondary | ICD-10-CM | POA: Diagnosis not present

## 2023-02-10 DIAGNOSIS — N182 Chronic kidney disease, stage 2 (mild): Secondary | ICD-10-CM | POA: Diagnosis not present

## 2023-02-10 DIAGNOSIS — Z792 Long term (current) use of antibiotics: Secondary | ICD-10-CM | POA: Diagnosis not present

## 2023-02-10 DIAGNOSIS — N3941 Urge incontinence: Secondary | ICD-10-CM | POA: Diagnosis not present

## 2023-02-10 DIAGNOSIS — B9561 Methicillin susceptible Staphylococcus aureus infection as the cause of diseases classified elsewhere: Secondary | ICD-10-CM | POA: Diagnosis not present

## 2023-02-10 DIAGNOSIS — Z9181 History of falling: Secondary | ICD-10-CM | POA: Diagnosis not present

## 2023-02-10 DIAGNOSIS — Z7982 Long term (current) use of aspirin: Secondary | ICD-10-CM | POA: Diagnosis not present

## 2023-02-10 DIAGNOSIS — T8452XA Infection and inflammatory reaction due to internal left hip prosthesis, initial encounter: Secondary | ICD-10-CM | POA: Diagnosis not present

## 2023-02-10 DIAGNOSIS — Z86718 Personal history of other venous thrombosis and embolism: Secondary | ICD-10-CM | POA: Diagnosis not present

## 2023-02-13 DIAGNOSIS — T8452XA Infection and inflammatory reaction due to internal left hip prosthesis, initial encounter: Secondary | ICD-10-CM | POA: Diagnosis not present

## 2023-02-21 ENCOUNTER — Other Ambulatory Visit: Payer: Self-pay

## 2023-02-21 ENCOUNTER — Ambulatory Visit (INDEPENDENT_AMBULATORY_CARE_PROVIDER_SITE_OTHER): Payer: PPO | Admitting: Internal Medicine

## 2023-02-21 ENCOUNTER — Encounter: Payer: Self-pay | Admitting: Internal Medicine

## 2023-02-21 VITALS — BP 147/77 | HR 60 | Temp 97.1°F | Ht 72.0 in | Wt 260.0 lb

## 2023-02-21 DIAGNOSIS — T8452XD Infection and inflammatory reaction due to internal left hip prosthesis, subsequent encounter: Secondary | ICD-10-CM | POA: Diagnosis not present

## 2023-02-21 DIAGNOSIS — T8450XD Infection and inflammatory reaction due to unspecified internal joint prosthesis, subsequent encounter: Secondary | ICD-10-CM

## 2023-02-21 MED ORDER — CEFADROXIL 500 MG PO CAPS: 1000.0000 mg | ORAL_CAPSULE | Freq: Two times a day (BID) | ORAL | 5 refills | Status: AC

## 2023-02-21 NOTE — Patient Instructions (Signed)
See me again in 3 months   Continue 1000 mg cefadrxil twice a day for 6 months

## 2023-02-21 NOTE — Progress Notes (Signed)
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   Pain in left hip 09/01/2017   History of revision of total replacement of left hip joint 09/01/2017   Encounter for surveillance of recalled total hip arthroplasty hardware (HCC) 06/07/2016   Status post revision of total hip replacement 06/07/2016   Spinal stenosis, lumbar region, 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   Red blood cell abnormality 02/11/2016   Elevated blood pressure reading without diagnosis of hypertension 07/01/2013   Peripheral neuropathy 06/02/2013   Health maintenance examination 11/26/2011   Prostate cancer (HCC) 05/21/2010   Obesity, Class I, BMI 30-34.9 05/18/2010   Venous (peripheral) insufficiency 05/18/2010   Osteoarthrosis involving lower leg 05/18/2010      Subjective:    Patient ID: [name], male    DOB: [birth year], 78 y.o.   MRN: [id number]  Chief Complaint  Patient presents with   Follow-up    HPI:  [name] is a 77 y.o. 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 Here for id clinic f/u   01/16/23 id f/u 01/03/23 operative culture mssa Patient doing well Hip incision has one little spot still not closed yet. All sutures intact. There is a lot of serosanguinous drainage and he had to change pad 2-3 times a day depending on activity No pain along the incision  or in the groin.  He'll see dr Magnus Ivan today  He is taking also the cefadroxil together with cefazolin which he didn't know he not supposed to   02/21/23 id clinic f/u Doing well on cephadroxil on 02/15/23; picc removed No hip pain Opat labs normalized crp The incision had fully closed as of 02/20/23   Allergies  Allergen Reactions   Adhesive [Tape] Rash    Paper tape is ok      Outpatient Medications Prior to Visit  Medication Sig Dispense Refill   acetaminophen (TYLENOL) 500 MG tablet Take 1,000 mg by mouth 2 (two) times daily as needed for moderate pain or headache.      aspirin EC 81 MG tablet Take 81 mg by mouth daily.     atorvastatin (LIPITOR) 20 MG tablet Take 1 tablet (20 mg total) by mouth daily. 90 tablet 3   Calcium Carb-Cholecalciferol (CALCIUM + D3 PO) Take 600 mg by mouth daily.     cefadroxil (DURICEF) 500 MG capsule Take 1 capsule (500 mg total) by mouth 2 (two) times daily. 60 capsule 11   solifenacin (VESICARE) 5 MG tablet Take 5 mg by mouth daily.     oxyCODONE (OXY IR/ROXICODONE) 5 MG immediate release tablet Take 1 tablet (5 mg total) by mouth every 6 (six) hours as needed for moderate pain (pain score 4-6). (Patient not taking: Reported on 02/21/2023) 30 tablet 0   No facility-administered medications prior to visit.     Social History   Socioeconomic History   Marital status: Married    Spouse name: Not on  file   Number of children: Not on file   Years of education: Not on file   Highest education level: Not on file  Occupational History   Not on file  Tobacco Use   Smoking status: Never   Smokeless tobacco: Never  Vaping Use   Vaping Use: Never used  Substance and Sexual Activity   Alcohol use: No   Drug use: No   Sexual activity: Not on file  Other Topics Concern   Not on file  Social History Narrative   Married, 2 biologic children, 2 adopted, 5 GC.   Lives in [place].  Worked 35 yrs at [employer].   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   No formal exercise.  No T/A/Ds.   Enjoys woodworking.   Social Determinants of Health   Financial Resource Strain: Low Risk  (01/03/2022)   Overall Financial Resource Strain (CARDIA)    Difficulty of Paying Living Expenses: Not very hard  Food Insecurity: No Food Insecurity (01/07/2023)   Hunger Vital Sign    Worried About Running Out of Food in the Last Year: Never true    Ran Out of Food in the Last Year: Never true  Transportation Needs: No Transportation Needs (01/07/2023)   PRAPARE - Administrator, Civil Service (Medical): No    Lack of Transportation (Non-Medical): No  Physical Activity: Insufficiently Active (01/03/2022)   Exercise Vital Sign    Days of Exercise per Week: 7 days    Minutes of Exercise per Session: 20 min  Stress: No Stress Concern Present (01/03/2022)   [employer] of Occupational Health - Occupational Stress Questionnaire    Feeling of Stress : Not at all  Social Connections: Socially Integrated (01/03/2022)   Social Connection and Isolation Panel [NHANES]    Frequency of Communication with Friends and Family: More than three times a week    Frequency of Social Gatherings with Friends and Family: More than three times a week    Attends Religious Services: More than 4 times per year    Active Member of [employer] or Organizations: Yes    Attends Engineer, structural: More than 4 times per year    Marital Status: Married  Catering manager Violence: Not At Risk (01/03/2023)   Humiliation, Afraid, Rape, and Kick questionnaire    Fear of Current or Ex-Partner: No    Emotionally Abused: No    Physically Abused: No    Sexually Abused: No      Review of Systems    All other ros negative   Objective:    BP (!) 147/77   Pulse 60   Temp (!) 97.1 F (36.2 C) (Temporal)   Ht 6' (1.829 m)   Wt 260 lb (117.9 kg)   SpO2 97%   BMI 35.26 kg/m  Nursing note and vital signs reviewed.  Physical Exam      General/constitutional: no distress, pleasant HEENT: Normocephalic, PER, Conj Clear, EOMI, Oropharynx clear Neck supple CV: rrr no mrg Lungs: clear to auscultation, normal respiratory effort Abd: Soft, Nontender Ext: no edema Skin: No Rash Neuro: nonfocal MSK: no peripheral joint swelling/tenderness/warmth; back spines nontender    Labs: Lab Results  Component Value Date   WBC 6.4 01/05/2023   HGB 13.9 01/05/2023   HCT 43.6 01/05/2023   MCV 94.4 01/05/2023   PLT 210 01/05/2023   Last metabolic panel Lab Results  Component Value Date   GLUCOSE 137 (H) 01/04/2023  NA 136 01/04/2023   K 4.5 01/04/2023   CL 106 01/04/2023   CO2 23 01/04/2023   BUN 23 01/04/2023   CREATININE 1.01 01/04/2023   GFRNONAA >60 01/04/2023   CALCIUM 9.5 01/04/2023   PHOS 2.5 05/21/2010   PROT 6.7 05/02/2022   ALBUMIN 3.7 05/02/2022   BILITOT 0.4 05/02/2022   ALKPHOS 92 05/02/2022   AST 11 05/02/2022   ALT 7 05/02/2022   ANIONGAP 7 01/04/2023   Opat labs: 02/10/23   cbc 5/15/192; cr 0.9  Crp: 02/10/23     4    (<10)    Micro:  Serology:  Imaging:  Assessment & Plan:   Problem List Items Addressed This Visit   None Visit Diagnoses     Infection of prosthetic joint, subsequent encounter    -  Primary        No orders of the defined types were placed in this encounter.      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 Here for id clinic f/u   He is followed by dr Orvan Falconer. Last seen 05/08/22 in clinic. Chart mention 03/2020 had a short course of  rifampin combination cefazolin during the most recent I&D prior to this admission.   S/p I&D 01/03/23 this admission. Cx mssa again (grew in 2017, 2019, and 2021). 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   ------------ 01/16/23 id assessment  #lab monitoring 4/08 opat labs cbc 5/14/213; cr 0.8; crp 19 (<10)  #prosthetic joint recurrent/chronic infection -- mssa S/p multiple surgery; last washout 01/03/23 with I&D and sinus tract removal -- no hardware exchange  -advise to stop cefadroxil for now while on cefazolin -once done with cefazolin on 02/14/23, can remove picc and start cefadroxil indefinitely  02/21/23 id assessment Labs and clinically looking very good Will continue cefadroxil 1000 mg twice a day for the next 6 months; after that if clinically looking good still will decrease to 500 mg twice a day indefinitely  F/u in 3 months    Follow-up: Return in about 3 months (around 05/24/2023).      Raymondo Band, MD [facility] 02/21/2023, 9:53 AM

## 2023-02-24 ENCOUNTER — Ambulatory Visit (INDEPENDENT_AMBULATORY_CARE_PROVIDER_SITE_OTHER): Payer: PPO | Admitting: Orthopaedic Surgery

## 2023-02-24 DIAGNOSIS — G8929 Other chronic pain: Secondary | ICD-10-CM

## 2023-02-24 DIAGNOSIS — M009 Pyogenic arthritis, unspecified: Secondary | ICD-10-CM

## 2023-02-24 DIAGNOSIS — Z96642 Presence of left artificial hip joint: Secondary | ICD-10-CM

## 2023-02-24 DIAGNOSIS — M1711 Unilateral primary osteoarthritis, right knee: Secondary | ICD-10-CM

## 2023-02-24 DIAGNOSIS — M25561 Pain in right knee: Secondary | ICD-10-CM | POA: Diagnosis not present

## 2023-02-24 MED ORDER — LIDOCAINE HCL 1 % IJ SOLN
3.0000 mL | INTRAMUSCULAR | Status: AC | PRN
  Administered 2023-02-24: 3 mL

## 2023-02-24 MED ORDER — METHYLPREDNISOLONE ACETATE 40 MG/ML IJ SUSP
40.0000 mg | INTRAMUSCULAR | Status: AC | PRN
  Administered 2023-02-24: 40 mg via INTRA_ARTICULAR

## 2023-02-24 NOTE — Progress Notes (Signed)
The patient is about 7 weeks out from an irrigation debridement of a chronically infected left hip.  He is 77 years old.  In light of a chronic infection he prefers suppression and would like to maintain his components knowing how debilitating it will be to remove all the components.  He feels okay today.  He would like to have a steroid injection in his right knee today which we have done in the past.  His left hip wound has a small area that is open but there is no redness and there is no drainage from this area.  I did give him 2 different types of ointments to try in order to see if we get the wound to heal.  I did place a steroid injection per his request in his right knee which she tolerated well.  I would like to see him back in 1 month for wound check.     Procedure Note  Patient: [name]             Date of Birth: August 07, 1946           MRN: [id number]             Visit Date: 02/24/2023  Procedures: Visit Diagnoses:  1. History of revision of total replacement of left hip joint   2. 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)     Large Joint Inj: R knee on 02/24/2023 11:20 AM Indications: diagnostic evaluation and pain Details: 22 G 1.5 in needle, superolateral approach  Arthrogram: No  Medications: 3 mL lidocaine 1 %; 40 mg methylPREDNISolone acetate 40 MG/ML Outcome: tolerated well, no immediate complications Procedure, treatment alternatives, risks and benefits explained, specific risks discussed. Consent was given by the patient. Immediately prior to procedure a time out was called to verify the correct patient, procedure, equipment, support staff and site/side marked as required. Patient was prepped and draped in the usual sterile fashion.

## 2023-03-13 DIAGNOSIS — C61 Malignant neoplasm of prostate: Secondary | ICD-10-CM | POA: Diagnosis not present

## 2023-03-13 LAB — PSA: PSA: <0.015

## 2023-03-13 LAB — TESTOSTERONE: Testosterone: 11.2

## 2023-03-20 DIAGNOSIS — Z8546 Personal history of malignant neoplasm of prostate: Secondary | ICD-10-CM | POA: Diagnosis not present

## 2023-03-20 DIAGNOSIS — E349 Endocrine disorder, unspecified: Secondary | ICD-10-CM | POA: Diagnosis not present

## 2023-03-26 ENCOUNTER — Telehealth: Payer: Self-pay

## 2023-03-26 ENCOUNTER — Ambulatory Visit (INDEPENDENT_AMBULATORY_CARE_PROVIDER_SITE_OTHER): Payer: PPO

## 2023-03-26 VITALS — Wt 250.0 lb

## 2023-03-26 DIAGNOSIS — Z0000 Encounter for general adult medical examination without abnormal findings: Secondary | ICD-10-CM

## 2023-03-26 NOTE — Progress Notes (Addendum)
Subjective:   [name] is a 77 y.o. male who presents for Medicare Annual/Subsequent preventive examination.  Visit Complete: Virtual  I connected with  [name] on 03/26/23 by a audio enabled telemedicine application and verified that I am speaking with the correct person using two identifiers.  Patient Location: Home  Provider Location: Home Office  I discussed the limitations of evaluation and management by telemedicine. The patient expressed understanding and agreed to proceed.   Review of Systems     Cardiac Risk Factors include: advanced age (>73men, >55 women);male gender;dyslipidemia;obesity (BMI >30kg/m2)     Objective:    Today's Vitals   03/26/23 1405  Weight: 250 lb (113.4 kg)   Body mass index is 33.91 kg/m.     03/26/2023    2:11 PM 01/03/2023    3:00 PM 01/02/2023    9:17 AM 01/03/2022    3:08 PM 02/11/2020   12:13 PM 02/08/2020    1:52 PM 10/09/2017    2:30 PM  Advanced Directives  Does Patient Have a Medical Advance Directive? Yes Yes Yes Yes Yes Yes Yes  Type of Estate agent of [place];Living will Healthcare Power of [place];Living will Living will;Healthcare Power of [employer] Power of [place];Living will Healthcare Power of [place];Living will Healthcare Power of [place];Living will Living will  Does patient want to make changes to medical advance directive? No - Patient declined No - Patient declined  No - Patient declined No - Patient declined  No - Patient declined  Copy of Healthcare Power of Attorney in Chart? Yes - validated most recent copy scanned in chart (See row information) Yes - validated most recent copy scanned in chart (See row information)  No - copy requested No - copy requested      Current Medications (verified) Outpatient Encounter Medications as of 03/26/2023  Medication Sig   acetaminophen (TYLENOL) 500 MG tablet Take 1,000 mg by mouth 2 (two) times daily as needed for moderate pain  or headache.    aspirin EC 81 MG tablet Take 81 mg by mouth daily.   atorvastatin (LIPITOR) 20 MG tablet Take 1 tablet (20 mg total) by mouth daily.   Calcium Carb-Cholecalciferol (CALCIUM + D3 PO) Take 600 mg by mouth daily.   cefadroxil (DURICEF) 500 MG capsule Take 2 capsules (1,000 mg total) by mouth 2 (two) times daily.   solifenacin (VESICARE) 5 MG tablet Take 5 mg by mouth daily.   [DISCONTINUED] oxyCODONE (OXY IR/ROXICODONE) 5 MG immediate release tablet Take 1 tablet (5 mg total) by mouth every 6 (six) hours as needed for moderate pain (pain score 4-6). (Patient not taking: Reported on 02/21/2023)   No facility-administered encounter medications on file as of 03/26/2023.    Allergies (verified) Adhesive [tape]   History: Past Medical History:  Diagnosis Date   Anterior dislocation of right shoulder 09/2013   Reduced in ED under sedation.  Dislocated posteriorly 2015, ? partial RC tear, has f/u planned with Dr. August Saucer--? surgery? possible plan for 2016.   Blood donor, whole blood    Has donated approx 30 gallons in his lifetime   Chronic left hip pain    Severe DJD.  THA 2008.  Left acetabular revision in 2017 AND 2019.   Chronic pain syndrome    Candidate for spinal cord stimulator (03/2017)   Chronic renal insufficiency, stage II (mild)    Chronic venous insufficiency    +compression hose   DDD (degenerative disc disease), lumbar 2017  Dr. Joette Catching ortho.  He is now wearing a new leg brace.  Has had ESI and will get another.   Diverticulosis    +redundant colon   DVT (deep venous thrombosis) (HCC) 06/14/2016   provoked, bilateral LL's; in post-op setting s/p hip surgery.  Xarelto x 72mo at full dosing, then 6 more months at 1/2 dosing, then stopped xarelto.     Foot drop, left    s/p hip fracture 1969   Hx of adenomatous colonic polyps    polypectomy 2007; 2012; 2015; 05/2019; recall 76yrs   Hypercholesterolemia 02/2017   Recommended statin 02/10/17 but pt declined,  wanted to do TLC trial first. Rec'd statin 04/2020   Osteoarthritis    knees and hips primarily   Post laminectomy syndrome    improved with spinal cord stim-->stim to be removed summer/fall 2019.   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 PSA 0.16 Feb 2020 urol f/u. [name]054 Aug 2021 urol. <0.015 02/2021.   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; 02/2020   MSSA; admitted for I&D 02/11/20->plan for longterm IV abx after   Urge incontinence    vesicare helpful   Past Surgical History:  Procedure Laterality Date   ANTERIOR HIP REVISION Left 06/07/2016   Procedure: LEFT ANTERIOR HIP ACETABULAR REVISION;  Surgeon: Kathryne Hitch, MD;  Location: [facility];  Service: Orthopedics;  Laterality: Left;   ANTERIOR HIP REVISION Left 10/09/2017   Procedure: Left hip acetabular revision;  Surgeon: Kathryne Hitch, MD;  Location: [facility];  Service: Orthopedics;  Laterality: Left;   arthroscopy left shoulder Left 2007   bilateral carpal tunnel release  2003   BILATERAL CARPAL TUNNEL RELEASE     COLONOSCOPY  2007;2012;2015;05/2019   04/12/2014 Tubular adenoma x 1; 05/2019 adenomatous polyp; recall 5 yrs.   FRACTURE SURGERY Left    INCISION AND DRAINAGE HIP Left 02/11/2020   Procedure: IRRIGATION AND DEBRIDEMENT LEFT HIP WITH ANTIBIOTIC SPACER PLACEMENT;  Surgeon: Kathryne Hitch, MD;  Location: [facility];  Service: Orthopedics;  Laterality: Left;   INCISION AND DRAINAGE HIP Left 01/03/2023   Procedure: IRRIGATION AND DEBRIDEMENT LEFT HIP;  Surgeon: Kathryne Hitch, MD;  Location: [facility];  Service: Orthopedics;  Laterality: Left;   JOINT REPLACEMENT Left    hip   LUMBAR LAMINECTOMY/DECOMPRESSION MICRODISCECTOMY Left 03/28/2016   Procedure: Left Lumbar three-four Laminoforaminotomy;  Surgeon: Maeola Harman, MD;  Location: [facility];  Service: Neurosurgery;   Laterality: Left;  left   POLYPECTOMY     RADIOACTIVE SEED IMPLANT  2012   ROTATOR CUFF REPAIR  2006   right   seed implant prostate  2012   SHOULDER ARTHROSCOPY Left    SPINAL CORD STIMULATOR IMPLANT  06/2017   removed a year after was putted in.   TOTAL HIP ARTHROPLASTY  2008   left ([place] [employer])   VASECTOMY     Family History  Problem Relation Age of Onset   Lung cancer Mother    Heart disease Father    Colon cancer Neg Hx    Esophageal cancer Neg Hx    Rectal cancer Neg Hx    Stomach cancer Neg Hx    Colon polyps Neg Hx    Social History   Socioeconomic History   Marital status: Married    Spouse name: Not on file   Number of children: Not on file   Years of education:  Not on file   Highest education level: Not on file  Occupational History   Not on file  Tobacco Use   Smoking status: Never   Smokeless tobacco: Never  Vaping Use   Vaping Use: Never used  Substance and Sexual Activity   Alcohol use: No   Drug use: No   Sexual activity: Not on file  Other Topics Concern   Not on file  Social History Narrative   Married, 2 biologic children, 2 adopted, 5 GC.   Lives in [place].  Worked 35 yrs at [employer].   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   No formal exercise.  No T/A/Ds.   Enjoys woodworking.   Social Determinants of Health   Financial Resource Strain: Low Risk  (03/26/2023)   Overall Financial Resource Strain (CARDIA)    Difficulty of Paying Living Expenses: Not hard at all  Food Insecurity: No Food Insecurity (03/26/2023)   Hunger Vital Sign    Worried About Running Out of Food in the Last Year: Never true    Ran Out of Food in the Last Year: Never true  Transportation Needs: No Transportation Needs (03/26/2023)   PRAPARE - Administrator, Civil Service (Medical): No    Lack of Transportation (Non-Medical): No  Physical Activity: Sufficiently Active (03/26/2023)   Exercise Vital Sign    Days of Exercise per Week:  7 days    Minutes of Exercise per Session: 30 min  Stress: No Stress Concern Present (03/26/2023)   [employer] of Occupational Health - Occupational Stress Questionnaire    Feeling of Stress : Not at all  Social Connections: Socially Integrated (03/26/2023)   Social Connection and Isolation Panel [NHANES]    Frequency of Communication with Friends and Family: More than three times a week    Frequency of Social Gatherings with Friends and Family: More than three times a week    Attends Religious Services: More than 4 times per year    Active Member of [employer] or Organizations: Yes    Attends Banker Meetings: 1 to 4 times per year    Marital Status: Married    Tobacco Counseling Counseling given: Not Answered   Clinical Intake:  Pre-visit preparation completed: Yes  Pain : No/denies pain     BMI - recorded: 33.91 Nutritional Status: BMI > 30  Obese Nutritional Risks: None Diabetes: No  How often do you need to have someone help you when you read instructions, pamphlets, or other written materials from your doctor or pharmacy?: 1 - Never  Interpreter Needed?: No  Information entered by :: Lanier Ensign, LPN   Activities of Daily Living    03/26/2023    2:12 PM 01/03/2023    3:00 PM  In your present state of health, do you have any difficulty performing the following activities:  Hearing? 0 0  Vision? 0 0  Difficulty concentrating or making decisions? 0 0  Walking or climbing stairs? 1 1  Comment related to hip   Dressing or bathing? 0 0  Doing errands, shopping? 0 0  Preparing Food and eating ? N   Using the Toilet? N   In the past six months, have you accidently leaked urine? N   Do you have problems with loss of bowel control? N   Managing your Medications? N   Managing your Finances? N   Housekeeping or managing your Housekeeping? N     Patient Care Team: McGowen,  Maryjean Morn, MD as PCP - General (Family Medicine) Jerilee Field, MD as  Consulting Physician (Urology) August Saucer Corrie Mckusick, MD as Consulting Physician (Orthopedic Surgery) Kathryne Hitch, MD as Consulting Physician (Orthopedic Surgery) Maeola Harman, MD as Consulting Physician (Neurosurgery) Hershal Coria, PsyD as Consulting Physician (Physical Medicine and Rehabilitation) Odette Fraction, MD (Inactive) as Consulting Physician (Anesthesiology) Hilarie Fredrickson, MD as Consulting Physician (Gastroenterology)  Indicate any recent Medical Services you may have received from other than [facility] providers in the past year (date may be approximate).     Assessment:   This is a routine wellness examination for [place].  Hearing/Vision screen Hearing Screening - Comments:: Pt denies any hearing issues  Vision Screening - Comments:: Pt follows up with Dr Blima Ledger for annual eye exams   Dietary issues and exercise activities discussed:     Goals Addressed             This Visit's Progress    Patient Stated       Stay healthy        Depression Screen    03/26/2023    2:10 PM 02/21/2023    9:41 AM 01/16/2023   10:52 AM 01/03/2022    3:06 PM 11/01/2020    8:48 AM 07/27/2020    9:27 AM 04/24/2020    8:45 AM  PHQ 2/9 Scores  PHQ - 2 Score 0 0 0 0 0 0 0    Fall Risk    03/26/2023    2:12 PM 02/21/2023    9:41 AM 01/16/2023   10:52 AM 01/03/2022    3:09 PM 05/03/2021    8:39 AM  Fall Risk   Falls in the past year? 1 0 0 0 0  Number falls in past yr: 1  0 0 0  Injury with Fall? 1  0 0 0  Comment hip injury      Risk for fall due to : Impaired vision;History of fall(s) Impaired balance/gait Impaired balance/gait History of fall(s)   Follow up  Falls evaluation completed Falls evaluation completed Falls evaluation completed     MEDICARE RISK AT HOME:  Medicare Risk at Home - 03/26/23 1413     Any stairs in or around the home? Yes    If so, are there any without handrails? No    Home free of loose throw rugs in walkways, pet beds, electrical  cords, etc? Yes    Adequate lighting in your home to reduce risk of falls? Yes    Life alert? No    Use of a cane, walker or w/c? Yes    Grab bars in the bathroom? Yes    Shower chair or bench in shower? No    Elevated toilet seat or a handicapped toilet? Yes             TIMED UP AND GO:  Was the test performed?  No    Cognitive Function:        03/26/2023    2:14 PM 01/03/2022    3:09 PM  6CIT Screen  What Year? 0 points 0 points  What month? 0 points 0 points  What time? 0 points 0 points  Count back from 20 0 points 0 points  Months in reverse 0 points 0 points  Repeat phrase 0 points 0 points  Total Score 0 points 0 points    Immunizations Immunization History  Administered Date(s) Administered   Fluad Quad(high Dose 65+) 08/01/2020   Influenza Split 06/08/2011,  07/07/2012   Influenza, High Dose Seasonal PF 07/20/2014, 08/03/2015, 07/11/2016, 07/03/2017, 07/19/2019   Influenza-Unspecified 07/21/2013, 07/03/2017, 07/07/2021   Moderna SARS-COV2 Booster Vaccination 10/12/2020   PFIZER(Purple Top)SARS-COV-2 Vaccination 12/03/2019, 12/28/2019   Pfizer Covid-19 Vaccine Bivalent Booster 14yrs & up 09/11/2021   Pneumococcal Conjugate-13 12/10/2013   Pneumococcal Polysaccharide-23 05/21/2010   Td 01/06/2004   Tdap 11/25/2012   Zoster, Live 11/26/2011    TDAP status: Due, Education has been provided regarding the importance of this vaccine. Advised may receive this vaccine at local pharmacy or Health Dept. Aware to provide a copy of the vaccination record if obtained from local pharmacy or Health Dept. Verbalized acceptance and understanding.  Flu Vaccine status: Due, Education has been provided regarding the importance of this vaccine. Advised may receive this vaccine at local pharmacy or Health Dept. Aware to provide a copy of the vaccination record if obtained from local pharmacy or Health Dept. Verbalized acceptance and understanding.  Pneumococcal vaccine status:  Up to date  Covid-19 vaccine status: Completed vaccines  Qualifies for Shingles Vaccine? Yes   Zostavax completed Yes   Shingrix Completed?: No.    Education has been provided regarding the importance of this vaccine. Patient has been advised to call insurance company to determine out of pocket expense if they have not yet received this vaccine. Advised may also receive vaccine at local pharmacy or Health Dept. Verbalized acceptance and understanding.  Screening Tests Health Maintenance  Topic Date Due   Hepatitis C Screening  Never done   Zoster Vaccines- Shingrix (1 of 2) Never done   COVID-19 Vaccine (4 - 2023-24 season) 06/07/2022   DTaP/Tdap/Td (3 - Td or Tdap) 11/25/2022   INFLUENZA VACCINE  05/08/2023   Medicare Annual Wellness (AWV)  03/25/2024   Colonoscopy  05/18/2024   Pneumonia Vaccine 71+ Years old  Completed   HPV VACCINES  Aged Out    Health Maintenance  Health Maintenance Due  Topic Date Due   Hepatitis C Screening  Never done   Zoster Vaccines- Shingrix (1 of 2) Never done   COVID-19 Vaccine (4 - 2023-24 season) 06/07/2022   DTaP/Tdap/Td (3 - Td or Tdap) 11/25/2022    Colorectal cancer screening: Type of screening: Colonoscopy. Completed 05/19/19. Repeat every 5 years  Additional Screening:  Hepatitis C Screening: does qualify;  Vision Screening: Recommended annual ophthalmology exams for early detection of glaucoma and other disorders of the eye. Is the patient up to date with their annual eye exam?  Yes  Who is the provider or what is the name of the office in which the patient attends annual eye exams? Dr Blima Ledger  If pt is not established with a provider, would they like to be referred to a provider to establish care? No .   Dental Screening: Recommended annual dental exams for proper oral hygiene   Community Resource Referral / Chronic Care Management: CRR required this visit?  No   CCM required this visit?  No     Plan:     I have  personally reviewed and noted the following in the patient's chart:   Medical and social history Use of alcohol, tobacco or illicit drugs  Current medications and supplements including opioid prescriptions. Patient is not currently taking opioid prescriptions. Functional ability and status Nutritional status Physical activity Advanced directives List of other physicians Hospitalizations, surgeries, and ER visits in previous 12 months Vitals Screenings to include cognitive, depression, and falls Referrals and appointments  In addition, I have reviewed and  discussed with patient certain preventive protocols, quality metrics, and best practice recommendations. A written personalized care plan for preventive services as well as general preventive health recommendations were provided to patient.     Marzella Schlein, LPN   01/20/6062   After Visit Summary: (MyChart) Due to this being a telephonic visit, the after visit summary with patients personalized plan was offered to patient via MyChart   Nurse Notes: none

## 2023-03-26 NOTE — Patient Instructions (Signed)
[name] , Thank you for taking time to come for your Medicare Wellness Visit. I appreciate your ongoing commitment to your health goals. Please review the following plan we discussed and let me know if I can assist you in the future.   These are the goals we discussed:  Goals      <enter goal here>     "no hip pain and get rid of walker" by continuing to complete PT exercises.      Patient Stated     Stay healthy         This is a list of the screening recommended for you and due dates:  Health Maintenance  Topic Date Due   Hepatitis C Screening  Never done   Zoster (Shingles) Vaccine (1 of 2) Never done   COVID-19 Vaccine (4 - 2023-24 season) 06/07/2022   DTaP/Tdap/Td vaccine (3 - Td or Tdap) 11/25/2022   Flu Shot  05/08/2023   Medicare Annual Wellness Visit  03/25/2024   Colon Cancer Screening  05/18/2024   Pneumonia Vaccine  Completed   HPV Vaccine  Aged Out    Advanced directives: Please bring a copy of your health care power of attorney and living will to the office at your convenience.  Conditions/risks identified: stay healthy   Next appointment: Follow up in one year for your annual wellness visit.   Preventive Care 63 Years and Older, Male  Preventive care refers to lifestyle choices and visits with your health care provider that can promote health and wellness. What does preventive care include? A yearly physical exam. This is also called an annual well check. Dental exams once or twice a year. Routine eye exams. Ask your health care provider how often you should have your eyes checked. Personal lifestyle choices, including: Daily care of your teeth and gums. Regular physical activity. Eating a healthy diet. Avoiding tobacco and drug use. Limiting alcohol use. Practicing safe sex. Taking low doses of aspirin every day. Taking vitamin and mineral supplements as recommended by your health care provider. What happens during an annual well check? The  services and screenings done by your health care provider during your annual well check will depend on your age, overall health, lifestyle risk factors, and family history of disease. Counseling  Your health care provider may ask you questions about your: Alcohol use. Tobacco use. Drug use. Emotional well-being. Home and relationship well-being. Sexual activity. Eating habits. History of falls. Memory and ability to understand (cognition). Work and work Astronomer. Screening  You may have the following tests or measurements: Height, weight, and BMI. Blood pressure. Lipid and cholesterol levels. These may be checked every 5 years, or more frequently if you are over 78 years old. Skin check. Lung cancer screening. You may have this screening every year starting at age 78 if you have a 30-pack-year history of smoking and currently smoke or have quit within the past 15 years. Fecal occult blood test (FOBT) of the stool. You may have this test every year starting at age 78. Flexible sigmoidoscopy or colonoscopy. You may have a sigmoidoscopy every 5 years or a colonoscopy every 10 years starting at age 78. Prostate cancer screening. Recommendations will vary depending on your family history and other risks. Hepatitis C blood test. Hepatitis B blood test. Sexually transmitted disease (STD) testing. Diabetes screening. This is done by checking your blood sugar (glucose) after you have not eaten for a while (fasting). You may have this done every 1-3 years. Abdominal  aortic aneurysm (AAA) screening. You may need this if you are a current or former smoker. Osteoporosis. You may be screened starting at age 78 if you are at high risk. Talk with your health care provider about your test results, treatment options, and if necessary, the need for more tests. Vaccines  Your health care provider may recommend certain vaccines, such as: Influenza vaccine. This is recommended every year. Tetanus,  diphtheria, and acellular pertussis (Tdap, Td) vaccine. You may need a Td booster every 10 years. Zoster vaccine. You may need this after age 78. Pneumococcal 13-valent conjugate (PCV13) vaccine. One dose is recommended after age 78. Pneumococcal polysaccharide (PPSV23) vaccine. One dose is recommended after age 78. Talk to your health care provider about which screenings and vaccines you need and how often you need them. This information is not intended to replace advice given to you by your health care provider. Make sure you discuss any questions you have with your health care provider. Document Released: 10/20/2015 Document Revised: 06/12/2016 Document Reviewed: 07/25/2015 Elsevier Interactive Patient Education  2017 [place] Prevention in the Home Falls can cause injuries. They can happen to people of all ages. There are many things you can do to make your home safe and to help prevent falls. What can I do on the outside of my home? Regularly fix the edges of walkways and driveways and fix any cracks. Remove anything that might make you trip as you walk through a door, such as a raised step or threshold. Trim any bushes or trees on the path to your home. Use bright outdoor lighting. Clear any walking paths of anything that might make someone trip, such as rocks or tools. Regularly check to see if handrails are loose or broken. Make sure that both sides of any steps have handrails. Any raised decks and porches should have guardrails on the edges. Have any leaves, snow, or ice cleared regularly. Use sand or salt on walking paths during winter. Clean up any spills in your garage right away. This includes oil or grease spills. What can I do in the bathroom? Use night lights. Install grab bars by the toilet and in the tub and shower. Do not use towel bars as grab bars. Use non-skid mats or decals in the tub or shower. If you need to sit down in the shower, use a plastic,  non-slip stool. Keep the floor dry. Clean up any water that spills on the floor as soon as it happens. Remove soap buildup in the tub or shower regularly. Attach bath mats securely with double-sided non-slip rug tape. Do not have throw rugs and other things on the floor that can make you trip. What can I do in the bedroom? Use night lights. Make sure that you have a light by your bed that is easy to reach. Do not use any sheets or blankets that are too big for your bed. They should not hang down onto the floor. Have a firm chair that has side arms. You can use this for support while you get dressed. Do not have throw rugs and other things on the floor that can make you trip. What can I do in the kitchen? Clean up any spills right away. Avoid walking on wet floors. Keep items that you use a lot in easy-to-reach places. If you need to reach something above you, use a strong step stool that has a grab bar. Keep electrical cords out of the way. Do not use  floor polish or wax that makes floors slippery. If you must use wax, use non-skid floor wax. Do not have throw rugs and other things on the floor that can make you trip. What can I do with my stairs? Do not leave any items on the stairs. Make sure that there are handrails on both sides of the stairs and use them. Fix handrails that are broken or loose. Make sure that handrails are as long as the stairways. Check any carpeting to make sure that it is firmly attached to the stairs. Fix any carpet that is loose or worn. Avoid having throw rugs at the top or bottom of the stairs. If you do have throw rugs, attach them to the floor with carpet tape. Make sure that you have a light switch at the top of the stairs and the bottom of the stairs. If you do not have them, ask someone to add them for you. What else can I do to help prevent falls? Wear shoes that: Do not have high heels. Have rubber bottoms. Are comfortable and fit you well. Are closed  at the toe. Do not wear sandals. If you use a stepladder: Make sure that it is fully opened. Do not climb a closed stepladder. Make sure that both sides of the stepladder are locked into place. Ask someone to hold it for you, if possible. Clearly mark and make sure that you can see: Any grab bars or handrails. First and last steps. Where the edge of each step is. Use tools that help you move around (mobility aids) if they are needed. These include: Canes. Walkers. Scooters. Crutches. Turn on the lights when you go into a dark area. Replace any light bulbs as soon as they burn out. Set up your furniture so you have a clear path. Avoid moving your furniture around. If any of your floors are uneven, fix them. If there are any pets around you, be aware of where they are. Review your medicines with your doctor. Some medicines can make you feel dizzy. This can increase your chance of falling. Ask your doctor what other things that you can do to help prevent falls. This information is not intended to replace advice given to you by your health care provider. Make sure you discuss any questions you have with your health care provider. Document Released: 07/20/2009 Document Revised: 02/29/2016 Document Reviewed: 10/28/2014 Elsevier Interactive Patient Education  2017 [employer].

## 2023-03-26 NOTE — Telephone Encounter (Signed)
Was able to speak with pharmacist regarding prescription for Cefadroxil. Will cancel prescription sent in by Dr. Orvan Falconer. Not able to fill cefadroxil until 6/22.  Attempted to call patient to inform him that everything has been straightened out and that he can pick up rx on 6/22. Not able to reach him at this time. Will try again later today. Juanita Laster, RMA

## 2023-03-26 NOTE — Telephone Encounter (Signed)
Called patient to inform him pharmacy should be able to fill Cefadroxil on 6/22. Will call back next week if he has any issues filling prescription. Juanita Laster, RMA

## 2023-03-26 NOTE — Telephone Encounter (Signed)
Patient called office regarding prescription for Cefadroxil. States that his recent refill was for Cefadroxil 500 mg one cap twice a day prescribed by Dr. Orvan Falconer.  Patient is supposed to be on Cefadroxil 500 mg two caps twice a day. Left voicemail with pharmacy to call office back today to ensure they fill correct prescription.  Patient would like call back once corrected.  Juanita Laster, RMA

## 2023-03-30 ENCOUNTER — Other Ambulatory Visit: Payer: Self-pay | Admitting: Family Medicine

## 2023-03-31 ENCOUNTER — Ambulatory Visit: Payer: PPO | Admitting: Orthopaedic Surgery

## 2023-05-02 NOTE — Patient Instructions (Signed)

## 2023-05-05 ENCOUNTER — Ambulatory Visit (INDEPENDENT_AMBULATORY_CARE_PROVIDER_SITE_OTHER): Payer: PPO | Admitting: Family Medicine

## 2023-05-05 ENCOUNTER — Encounter: Payer: Self-pay | Admitting: Family Medicine

## 2023-05-05 VITALS — BP 136/76 | HR 59 | Ht 72.0 in | Wt 257.0 lb

## 2023-05-05 DIAGNOSIS — Z23 Encounter for immunization: Secondary | ICD-10-CM

## 2023-05-05 DIAGNOSIS — Z0000 Encounter for general adult medical examination without abnormal findings: Secondary | ICD-10-CM | POA: Diagnosis not present

## 2023-05-05 DIAGNOSIS — E7800 Pure hypercholesterolemia, unspecified: Secondary | ICD-10-CM

## 2023-05-05 LAB — COMPREHENSIVE METABOLIC PANEL
ALT: 12 U/L (ref 0–53)
AST: 13 U/L (ref 0–37)
Albumin: 3.8 g/dL (ref 3.5–5.2)
Alkaline Phosphatase: 96 U/L (ref 39–117)
BUN: 22 mg/dL (ref 6–23)
CO2: 26 mEq/L (ref 19–32)
Calcium: 10.2 mg/dL (ref 8.4–10.5)
Chloride: 104 mEq/L (ref 96–112)
Creatinine, Ser: 1.07 mg/dL (ref 0.40–1.50)
GFR: 66.57 mL/min (ref >60.00)
Glucose, Bld: 111 mg/dL — ABNORMAL HIGH (ref 70–99)
Potassium: 4.4 mEq/L (ref 3.5–5.1)
Sodium: 138 mEq/L (ref 135–145)
Total Bilirubin: 0.6 mg/dL (ref 0.2–1.2)
Total Protein: 6.5 g/dL (ref 6.0–8.3)

## 2023-05-05 LAB — CBC
HCT: 45.2 % (ref 39.0–52.0)
Hemoglobin: 14.6 g/dL (ref 13.0–17.0)
MCHC: 32.4 g/dL (ref 30.0–36.0)
MCV: 89 fl (ref 78.0–100.0)
Platelets: 234 10*3/uL (ref 150.0–400.0)
RBC: 5.08 Mil/uL (ref 4.22–5.81)
RDW: 15.8 % — ABNORMAL HIGH (ref 11.5–15.5)
WBC: 4.8 10*3/uL (ref 4.0–10.5)

## 2023-05-05 LAB — LIPID PANEL
Cholesterol: 116 mg/dL (ref 0–200)
HDL: 35.4 mg/dL — ABNORMAL LOW (ref >39.00)
LDL Cholesterol: 63 mg/dL (ref 0–99)
NonHDL: 80.7
Total CHOL/HDL Ratio: 3
Triglycerides: 87 mg/dL (ref 0.0–149.0)
VLDL: 17.4 mg/dL (ref 0.0–40.0)

## 2023-05-05 MED ORDER — TETANUS-DIPHTH-ACELL PERTUSSIS 5-2.5-18.5 LF-MCG/0.5 IM SUSP: 0.5000 mL | Freq: Once | INTRAMUSCULAR | 0 refills | Status: AC

## 2023-05-05 MED ORDER — ZOSTER VAC RECOMB ADJUVANTED 50 MCG/0.5ML IM SUSR: 0.5000 mL | Freq: Once | INTRAMUSCULAR | 0 refills | Status: AC

## 2023-05-05 MED ORDER — ATORVASTATIN CALCIUM 20 MG PO TABS: 20.0000 mg | ORAL_TABLET | Freq: Every day | ORAL | 3 refills | Status: DC

## 2023-05-05 NOTE — Progress Notes (Signed)
Office Note 05/05/2023  CC:  Chief Complaint  Patient presents with   Annual Exam    Pt is not fasting.      HPI:  Patient is a 77 y.o. male who is here for annual health maintenance exam and follow-up hypercholesterolemia.  Tong is feeling well. He had to get his left hip incised and drained in March this year.  He continues to take Duricef 1000 mg twice a day long-term.  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  Past Medical History:  Diagnosis Date   Anterior dislocation of right shoulder 09/2013   Reduced in ED under sedation.  Dislocated posteriorly 2015, ? partial RC tear, has f/u planned with Dr. August Saucer--? surgery? possible plan for 2016.   Blood donor, whole blood    Has donated approx 30 gallons in his lifetime   Chronic left hip pain    Severe DJD.  THA 2008.  Left acetabular revision in 2017 AND 2019.   Chronic pain syndrome    Candidate for spinal cord stimulator (03/2017)   Chronic renal insufficiency, stage II (mild)    Chronic venous insufficiency    +compression hose   DDD (degenerative disc disease), lumbar 2017   Dr. Joette Catching ortho.  He is now wearing a new leg brace.  Has had ESI and will get another.   Diverticulosis    +redundant colon   DVT (deep venous thrombosis) (HCC) 06/14/2016   provoked, bilateral LL's; in post-op setting s/p hip surgery.  Xarelto x 97mo at full dosing, then 6 more months at 1/2 dosing, then stopped xarelto.     Foot drop, left    s/p hip fracture 1969   Hx of adenomatous colonic polyps    polypectomy 2007; 2012; 2015; 05/2019; recall 67yrs   Hypercholesterolemia 02/2017   Recommended statin 02/10/17 but pt declined, wanted to do TLC trial first. Rec'd statin 04/2020   Osteoarthritis    knees and hips primarily   Post laminectomy syndrome    improved with spinal cord stim-->stim to be removed summer/fall 2019.   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 PSA 0.16 Feb 2020 urol f/u. [name]054 Aug 2021 urol. <0.015 02/2021.   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; 02/2020   MSSA; admitted for I&D 02/11/20->plan for longterm IV abx after   Urge incontinence    vesicare helpful    Past Surgical History:  Procedure Laterality Date   ANTERIOR HIP REVISION Left 06/07/2016   Procedure: LEFT ANTERIOR HIP ACETABULAR REVISION;  Surgeon: Kathryne Hitch, MD;  Location: [facility];  Service: Orthopedics;  Laterality: Left;   ANTERIOR HIP REVISION Left 10/09/2017   Procedure: Left hip acetabular revision;  Surgeon: Kathryne Hitch, MD;  Location: [facility];  Service: Orthopedics;  Laterality: Left;   arthroscopy left shoulder Left 2007   bilateral carpal tunnel release  2003   BILATERAL CARPAL TUNNEL RELEASE     COLONOSCOPY  2007;2012;2015;05/2019   04/12/2014 Tubular adenoma x 1; 05/2019 adenomatous polyp; recall 5 yrs.   FRACTURE SURGERY Left    INCISION AND DRAINAGE HIP Left 02/11/2020   Procedure: IRRIGATION AND DEBRIDEMENT LEFT HIP WITH ANTIBIOTIC SPACER PLACEMENT;  Surgeon: Kathryne Hitch, MD;  Location: [facility];  Service: Orthopedics;  Laterality: Left;   INCISION AND DRAINAGE HIP Left 01/03/2023   Procedure: IRRIGATION AND DEBRIDEMENT LEFT HIP;  Surgeon: Kathryne Hitch, MD;  Location: [facility];  Service: Orthopedics;  Laterality: Left;   JOINT REPLACEMENT Left    hip   LUMBAR LAMINECTOMY/DECOMPRESSION MICRODISCECTOMY Left 03/28/2016   Procedure: Left Lumbar three-four Laminoforaminotomy;  Surgeon: Maeola Harman, MD;  Location: [facility];  Service: Neurosurgery;  Laterality: Left;  left   POLYPECTOMY     RADIOACTIVE SEED IMPLANT  2012   ROTATOR CUFF REPAIR  2006   right   seed implant prostate  2012   SHOULDER ARTHROSCOPY Left    SPINAL CORD STIMULATOR IMPLANT  06/2017   removed a year after was putted in.   TOTAL HIP  ARTHROPLASTY  2008   left (c[place])   VASECTOMY      Family History  Problem Relation Age of Onset   Lung cancer Mother    Heart disease Father    Colon cancer Neg Hx    Esophageal cancer Neg Hx    Rectal cancer Neg Hx    Stomach cancer Neg Hx    Colon polyps Neg Hx     Social History   Socioeconomic History   Marital status: Married    Spouse name: Not on file   Number of children: Not on file   Years of education: Not on file   Highest education level: Not on file  Occupational History   Not on file  Tobacco Use   Smoking status: Never   Smokeless tobacco: Never  Vaping Use   Vaping status: Never Used  Substance and Sexual Activity   Alcohol use: No   Drug use: No   Sexual activity: Not on file  Other Topics Concern   Not on file  Social History Narrative   Married, 2 biologic children, 2 adopted, 5 GC.   Lives in [place].  Worked 35 yrs at [employer].   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   No formal exercise.  No T/A/Ds.   Enjoys woodworking.   Social Determinants of Health   Financial Resource Strain: Low Risk  (03/26/2023)   Overall Financial Resource Strain (CARDIA)    Difficulty of Paying Living Expenses: Not hard at all  Food Insecurity: No Food Insecurity (03/26/2023)   Hunger Vital Sign    Worried About Running Out of Food in the Last Year: Never true    Ran Out of Food in the Last Year: Never true  Transportation Needs: No Transportation Needs (03/26/2023)   PRAPARE - Administrator, Civil Service (Medical): No    Lack of Transportation (Non-Medical): No  Physical Activity: Sufficiently Active (03/26/2023)   Exercise Vital Sign    Days of Exercise per Week: 7 days    Minutes of Exercise per Session: 30 min  Stress: No Stress Concern Present (03/26/2023)   [employer] of Occupational Health - Occupational Stress Questionnaire    Feeling of Stress : Not at all  Social Connections: Socially Integrated  (03/26/2023)   Social Connection and Isolation Panel [NHANES]    Frequency of Communication with Friends and Family: More than three times a week    Frequency of Social Gatherings with Friends and Family: More than three times a week    Attends Religious Services: More than 4 times per year    Active Member of [employer] or Organizations: Yes    Attends Banker Meetings: 1 to 4 times per year    Marital Status: Married  Catering manager Violence: Not At Risk (03/26/2023)  Humiliation, Afraid, Rape, and Kick questionnaire    Fear of Current or Ex-Partner: No    Emotionally Abused: No    Physically Abused: No    Sexually Abused: No    Outpatient Medications Prior to Visit  Medication Sig Dispense Refill   acetaminophen (TYLENOL) 500 MG tablet Take 1,000 mg by mouth 2 (two) times daily as needed for moderate pain or headache.      aspirin EC 81 MG tablet Take 81 mg by mouth daily.     Calcium Carb-Cholecalciferol (CALCIUM + D3 PO) Take 600 mg by mouth daily.     cefadroxil (DURICEF) 500 MG capsule Take 2 capsules (1,000 mg total) by mouth 2 (two) times daily. 120 capsule 5   solifenacin (VESICARE) 5 MG tablet Take 5 mg by mouth daily.     atorvastatin (LIPITOR) 20 MG tablet Take 1 tablet (20 mg total) by mouth daily. 90 tablet 3   No facility-administered medications prior to visit.    Allergies  Allergen Reactions   Adhesive [Tape] Rash    Paper tape is ok    Review of Systems  Constitutional:  Negative for appetite change, chills, fatigue and fever.  HENT:  Negative for congestion, dental problem, ear pain and sore throat.   Eyes:  Negative for discharge, redness and visual disturbance.  Respiratory:  Negative for cough, chest tightness, shortness of breath and wheezing.   Cardiovascular:  Negative for chest pain, palpitations and leg swelling.  Gastrointestinal:  Negative for abdominal pain, blood in stool, diarrhea, nausea and vomiting.  Genitourinary:  Negative for  difficulty urinating, dysuria, flank pain, frequency, hematuria and urgency.  Musculoskeletal:  Negative for arthralgias, back pain, joint swelling, myalgias and neck stiffness.  Skin:  Negative for pallor and rash.  Neurological:  Negative for dizziness, speech difficulty, weakness and headaches.  Hematological:  Negative for adenopathy. Does not bruise/bleed easily.  Psychiatric/Behavioral:  Negative for confusion and sleep disturbance. The patient is not nervous/anxious.     PE;    05/05/2023    8:40 AM 03/26/2023    2:05 PM 02/21/2023    9:40 AM  Vitals with BMI  Height 6\' 0"   6\' 0"   Weight 257 lbs 250 lbs 260 lbs  BMI 34.85  35.25  Systolic 136  147  Diastolic 76  77  Pulse 59  60    Gen: Alert, well appearing.  Patient is oriented to person, place, time, and situation. AFFECT: pleasant, lucid thought and speech. ENT: Ears: EACs clear, normal epithelium.  TMs with good light reflex and landmarks bilaterally.  Eyes: no injection, icteris, swelling, or exudate.  EOMI, PERRLA. Nose: no drainage or turbinate edema/swelling.  No injection or focal lesion.  Mouth: lips without lesion/swelling.  Oral mucosa pink and moist.  Dentition intact and without obvious caries or gingival swelling.  Oropharynx without erythema, exudate, or swelling.  Neck: supple/nontender.  No LAD, mass, or TM.  Carotid pulses 2+ bilaterally, without bruits. CV: RRR, no m/r/g.   LUNGS: CTA bilat, nonlabored resps, good aeration in all lung fields. ABD: soft, NT, ND, BS normal.  No hepatospenomegaly or mass.  No bruits. EXT: no clubbing, cyanosis, or edema.  Musculoskeletal: no joint swelling, erythema, warmth, or tenderness.  ROM of all joints intact. Skin - no sores or suspicious lesions or rashes or color changes  Pertinent labs:  Lab Results  Component Value Date   TSH 2.07 02/10/2017   Lab Results  Component Value Date   WBC 6.4 01/05/2023  HGB 13.9 01/05/2023   HCT 43.6 01/05/2023   MCV 94.4  01/05/2023   PLT 210 01/05/2023   Lab Results  Component Value Date   CREATININE 1.01 01/04/2023   BUN 23 01/04/2023   NA 136 01/04/2023   K 4.5 01/04/2023   CL 106 01/04/2023   CO2 23 01/04/2023   Lab Results  Component Value Date   ALT 7 05/02/2022   AST 11 05/02/2022   ALKPHOS 92 05/02/2022   BILITOT 0.4 05/02/2022   Lab Results  Component Value Date   CHOL 135 05/02/2022   Lab Results  Component Value Date   HDL 41.70 05/02/2022   Lab Results  Component Value Date   LDLCALC 76 05/02/2022   Lab Results  Component Value Date   TRIG 86.0 05/02/2022   Lab Results  Component Value Date   CHOLHDL 3 05/02/2022   Lab Results  Component Value Date   PSA <0.015 03/13/2023   PSA <0.015 06/14/2022   PSA <0.015 03/13/2022   Lab Results  Component Value Date   HGBA1C 5.4 05/27/2013   ASSESSMENT AND PLAN:   No problem-specific Assessment & Plan notes found for this encounter.  1) Health maintenance exam: Reviewed age and gender appropriate health maintenance issues (prudent diet, regular exercise, health risks of tobacco and excessive alcohol, use of seatbelts, fire alarms in home, use of sunscreen).  Also reviewed age and gender appropriate health screening as well as vaccine recommendations. Vaccines: Shingrix->rx to pharm.  Tdap->rx to pharm.  Otherwise ALL UTD. Labs:  cbc,cmet, lipids Prostate ca screening: hx of prostate ca and is followed by urol. Colon ca screening: next colonoscopy due 05/2024  2) Hypercholesterolemia, doing well on a tour with statin 20 mg a day. Lipid panel and hepatic panel today.  An After Visit Summary was printed and given to the patient.  FOLLOW UP:  Return in about 1 year (around 05/04/2024) for annual CPE (fasting).  Signed:  Santiago Bumpers, MD           05/05/2023

## 2023-05-06 ENCOUNTER — Other Ambulatory Visit (INDEPENDENT_AMBULATORY_CARE_PROVIDER_SITE_OTHER): Payer: PPO

## 2023-05-06 ENCOUNTER — Encounter: Payer: Self-pay | Admitting: Family Medicine

## 2023-05-06 DIAGNOSIS — R7301 Impaired fasting glucose: Secondary | ICD-10-CM

## 2023-05-06 LAB — HEMOGLOBIN A1C: Hgb A1c MFr Bld: 5.7 % (ref 4.6–6.5)

## 2023-05-06 NOTE — Progress Notes (Signed)
Added

## 2023-05-08 ENCOUNTER — Ambulatory Visit: Payer: PPO | Admitting: Internal Medicine

## 2023-05-20 ENCOUNTER — Other Ambulatory Visit: Payer: Self-pay

## 2023-05-20 ENCOUNTER — Encounter: Payer: Self-pay | Admitting: Internal Medicine

## 2023-05-20 ENCOUNTER — Ambulatory Visit (INDEPENDENT_AMBULATORY_CARE_PROVIDER_SITE_OTHER): Payer: PPO | Admitting: Internal Medicine

## 2023-05-20 VITALS — BP 144/77 | HR 64 | Temp 97.8°F | Wt 257.0 lb

## 2023-05-20 DIAGNOSIS — T8450XD Infection and inflammatory reaction due to unspecified internal joint prosthesis, subsequent encounter: Secondary | ICD-10-CM | POA: Diagnosis not present

## 2023-05-20 DIAGNOSIS — T8452XD Infection and inflammatory reaction due to internal left hip prosthesis, subsequent encounter: Secondary | ICD-10-CM

## 2023-05-20 LAB — CBC
HCT: 45.2 % (ref 38.5–50.0)
Hemoglobin: 14.7 g/dL (ref 13.2–17.1)
MCH: 28.4 pg (ref 27.0–33.0)
MCHC: 32.5 g/dL (ref 32.0–36.0)
MCV: 87.3 fL (ref 80.0–100.0)
MPV: 10 fL (ref 7.5–12.5)
Platelets: 283 10*3/uL (ref 140–400)
RBC: 5.18 10*6/uL (ref 4.20–5.80)
RDW: 13.6 % (ref 11.0–15.0)
WBC: 5.8 10*3/uL (ref 3.8–10.8)

## 2023-05-20 NOTE — Progress Notes (Signed)
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   Pain in left hip 09/01/2017   History of revision of total replacement of left hip joint 09/01/2017   Encounter for surveillance of recalled total hip arthroplasty hardware (HCC) 06/07/2016   Status post revision of total hip replacement 06/07/2016   Spinal stenosis, lumbar region, 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   Red blood cell abnormality 02/11/2016   Elevated blood pressure reading without diagnosis of hypertension 07/01/2013   Peripheral neuropathy 06/02/2013   Health maintenance examination 11/26/2011   Prostate cancer (HCC) 05/21/2010   Obesity, Class I, BMI 30-34.9 05/18/2010   Venous (peripheral) insufficiency 05/18/2010   Osteoarthrosis involving lower leg 05/18/2010      Subjective:    Patient ID: [name], male    DOB: 01-15-1946, 77 y.o.   MRN: [id number]  Chief Complaint  Patient presents with   Follow-up    Bruising more/ fluid build up on back leg/     HPI:  [name] is a 77 y.o. 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 Here for id clinic f/u   01/16/23 id f/u 01/03/23 operative culture mssa Patient doing well Hip incision has one little spot still not closed yet. All sutures intact. There is a lot of serosanguinous drainage and he had to  change pad 2-3 times a day depending on activity No pain along the incision or in the groin.  He'll see dr Magnus Ivan today  He is taking also the cefadroxil together with cefazolin which he didn't know he not supposed to   02/21/23 id clinic f/u Doing well on cephadroxil on 02/15/23; picc removed No hip pain Opat labs normalized crp The incision had fully closed as of 02/20/23  05/20/23 id clinic f/u See a&p   Allergies  Allergen Reactions   Adhesive [Tape] Rash    Paper tape is ok      Outpatient Medications Prior to Visit  Medication Sig Dispense Refill   acetaminophen (TYLENOL) 500 MG tablet Take 1,000 mg by mouth 2 (two) times daily as needed for moderate pain or headache.      aspirin EC 81 MG tablet Take 81 mg by mouth daily.     atorvastatin (LIPITOR) 20 MG tablet Take 1 tablet (20 mg total) by mouth daily. 90 tablet 3   Calcium Carb-Cholecalciferol (CALCIUM + D3 PO) Take 600 mg by mouth daily.     cefadroxil (DURICEF) 500 MG capsule Take 2 capsules (1,000 mg total) by mouth 2 (two) times daily. 120 capsule 5   solifenacin (VESICARE) 5 MG tablet Take 5 mg by mouth daily.     No facility-administered medications prior to visit.     Social History   Socioeconomic History   Marital status: Married    Spouse name: Not on file   Number of children: Not on file   Years of education: Not on file  Highest education level: Not on file  Occupational History   Not on file  Tobacco Use   Smoking status: Never   Smokeless tobacco: Never  Vaping Use   Vaping status: Never Used  Substance and Sexual Activity   Alcohol use: No   Drug use: No   Sexual activity: Not on file  Other Topics Concern   Not on file  Social History Narrative   Married, 2 biologic children, 2 adopted, 5 GC.   Lives in [place].  Worked 35 yrs at [employer].   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   No formal exercise.  No T/A/Ds.   Enjoys woodworking.   Social Determinants  of Health   Financial Resource Strain: Low Risk  (03/26/2023)   Overall Financial Resource Strain (CARDIA)    Difficulty of Paying Living Expenses: Not hard at all  Food Insecurity: No Food Insecurity (03/26/2023)   Hunger Vital Sign    Worried About Running Out of Food in the Last Year: Never true    Ran Out of Food in the Last Year: Never true  Transportation Needs: No Transportation Needs (03/26/2023)   PRAPARE - Administrator, Civil Service (Medical): No    Lack of Transportation (Non-Medical): No  Physical Activity: Sufficiently Active (03/26/2023)   Exercise Vital Sign    Days of Exercise per Week: 7 days    Minutes of Exercise per Session: 30 min  Stress: No Stress Concern Present (03/26/2023)   [name] [employer] - Occupational Stress Questionnaire    Feeling of Stress : Not at all  Social Connections: Socially Integrated (03/26/2023)   Social Connection and Isolation Panel [NHANES]    Frequency of Communication with Friends and Family: More than three times a week    Frequency of Social Gatherings with Friends and Family: More than three times a week    Attends Religious Services: More than 4 times per year    Active Member of [employer] or Organizations: Yes    Attends Banker Meetings: 1 to 4 times per year    Marital Status: Married  Catering manager Violence: Not At Risk (03/26/2023)   Humiliation, Afraid, Rape, and Kick questionnaire    Fear of Current or Ex-Partner: No    Emotionally Abused: No    Physically Abused: No    Sexually Abused: No      Review of Systems    All other ros negative   Objective:    BP (!) 144/77   Pulse 64   Temp 97.8 F (36.6 C) (Oral)   Wt 257 lb (116.6 kg)   SpO2 97%   BMI 34.86 kg/m  Nursing note and vital signs reviewed.  Physical Exam     General/constitutional: no distress, pleasant HEENT: Normocephalic, PER, Conj Clear, EOMI, Oropharynx clear Neck supple CV: rrr no  mrg Lungs: clear to auscultation, normal respiratory effort Abd: Soft, Nontender Ext: no edema Skin: No Rash Neuro: nonfocal MSK: no peripheral joint swelling/tenderness/warmth; back spines nontender    Labs: Lab Results  Component Value Date   WBC 4.8 05/05/2023   HGB 14.6 05/05/2023   HCT 45.2 05/05/2023   MCV 89.0 05/05/2023   PLT 234.0 05/05/2023   Last metabolic panel Lab Results  Component Value Date   GLUCOSE 111 (H) 05/05/2023   NA 138 05/05/2023   K 4.4 05/05/2023   CL 104 05/05/2023   CO2 26 05/05/2023   BUN 22 05/05/2023  CREATININE 1.07 05/05/2023   GFRNONAA >60 01/04/2023   CALCIUM 10.2 05/05/2023   PHOS 2.5 05/21/2010   PROT 6.5 05/05/2023   ALBUMIN 3.8 05/05/2023   BILITOT 0.6 05/05/2023   ALKPHOS 96 05/05/2023   AST 13 05/05/2023   ALT 12 05/05/2023   ANIONGAP 7 01/04/2023   Opat labs: 02/10/23   cbc 5/15/192; cr 0.9  Crp: 02/10/23     4    (<10)    Micro:  Serology:  Imaging:  Assessment & Plan:   Problem List Items Addressed This Visit   None     No orders of the defined types were placed in this encounter.      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 Here for id clinic f/u   He is followed by dr Orvan Falconer. Last seen 05/08/22 in clinic. Chart mention 03/2020 had a short course of rifampin combination cefazolin during the most recent I&D prior to this admission.    S/p I&D 01/03/23 this admission. Cx mssa again (grew in 2017, 2019, and 2021). 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 Hardware retained     I am not optimistic in curing this or suppressing this given the history/operative finding   No role for rifampin in his case   Had planned 6 weeks iv cefazolin then transitioned to indefinite suppression on 02/14/23.   ------------ 01/16/23 id assessment  #lab monitoring 4/08 opat labs cbc 5/14/213; cr 0.8; crp 19 (<10)  #prosthetic joint recurrent/chronic infection -- mssa S/p multiple surgery; last washout 01/03/23 with I&D and sinus tract removal -- no hardware exchange  -advise to stop cefadroxil for now while on cefazolin -once done with cefazolin on 02/14/23, can remove picc and start cefadroxil indefinitely  02/21/23 id assessment Labs and clinically looking very good Will continue cefadroxil 1000 mg twice a day for the next 6 months; after that if clinically looking good still will decrease to 500 mg twice a day indefinitely  F/u in 3 months   05/20/23 id assessment Doing well He thinks since he's been taking twice a day high dose cefadroxil he is more prone to bruising. He has a big hematoma on left buttock from just riding lawn mower He takes asa 81 mg once a day; no herbal supplement; no anticoagulant It would be very unusual if related to cefadroxil, but we can see labs today and check platelet level Tolerating cefadroxil otherwise  Advise to talk to primary care about aspirin use if further hematoma forms  Follow-up: Return in about 3 months (around 08/20/2023).      Raymondo Band, MD [facility] 05/20/2023, 9:51 AM

## 2023-05-20 NOTE — Patient Instructions (Signed)
Blood tests today  See me in 3 months and we'll decrease cefadroxil dose down   Talk to your pcp about aspirin and bruising   If your platelet level is low will have to adjust or change your antibiotics. At this time that's the only potential connection I have with the bruising and the antibiotics. If the platelet level is normal then maybe you have intense response with aspirin

## 2023-05-28 ENCOUNTER — Ambulatory Visit: Payer: PPO | Admitting: Orthopaedic Surgery

## 2023-05-28 ENCOUNTER — Encounter: Payer: Self-pay | Admitting: Orthopaedic Surgery

## 2023-05-28 DIAGNOSIS — M25561 Pain in right knee: Secondary | ICD-10-CM | POA: Diagnosis not present

## 2023-05-28 DIAGNOSIS — G8929 Other chronic pain: Secondary | ICD-10-CM

## 2023-05-28 DIAGNOSIS — T792XXD Traumatic secondary and recurrent hemorrhage and seroma, subsequent encounter: Secondary | ICD-10-CM | POA: Diagnosis not present

## 2023-05-28 MED ORDER — METHYLPREDNISOLONE ACETATE 40 MG/ML IJ SUSP
40.0000 mg | INTRAMUSCULAR | Status: AC | PRN
  Administered 2023-05-28: 40 mg via INTRA_ARTICULAR

## 2023-05-28 MED ORDER — LIDOCAINE HCL 1 % IJ SOLN
3.0000 mL | INTRAMUSCULAR | Status: AC | PRN
  Administered 2023-05-28: 3 mL

## 2023-05-28 NOTE — Progress Notes (Signed)
The patient is well-known to me.  He has been dealing with a chronic left hip infection.  He comes in requesting a steroid injection in his right knee due to arthritis in the right knee.  We last provided steroid injection in that right knee 3 months ago.  He did let me know that he is developed some swelling in the back of his left thigh.  That is on the same side that he has a chronic prosthetic joint infection.  He has been doing a lot of riding on a 0 turn mower for mowing grass.  Examination of his left thigh does show a large fluid collection.  I was able to aspirate several 100 cc of fluid and push out a lot of fluid from the thigh.  It was very thin and consistent with a large seroma.  This is likely due to his chronic infection.  I did place a steroid injection per his request in his right knee.  He tolerated this well.  I would like to see him back in 1 week for likely repeat aspiration of his left posterior thigh seroma.    Procedure Note  Patient: [name]             Date of Birth: 20-Oct-1945           MRN: [id number]             Visit Date: 05/28/2023  Procedures: Visit Diagnoses:  1. Chronic pain of right knee   2. Traumatic seroma of left thigh, subsequent encounter     Large Joint Inj: R knee on 05/28/2023 11:31 AM Indications: diagnostic evaluation and pain Details: 22 G 1.5 in needle, superolateral approach  Arthrogram: No  Medications: 3 mL lidocaine 1 %; 40 mg methylPREDNISolone acetate 40 MG/ML Outcome: tolerated well, no immediate complications Procedure, treatment alternatives, risks and benefits explained, specific risks discussed. Consent was given by the patient. Immediately prior to procedure a time out was called to verify the correct patient, procedure, equipment, support staff and site/side marked as required. Patient was prepped and draped in the usual sterile fashion.

## 2023-06-04 ENCOUNTER — Encounter: Payer: Self-pay | Admitting: Orthopaedic Surgery

## 2023-06-04 ENCOUNTER — Ambulatory Visit: Payer: PPO | Admitting: Orthopaedic Surgery

## 2023-06-04 DIAGNOSIS — T792XXD Traumatic secondary and recurrent hemorrhage and seroma, subsequent encounter: Secondary | ICD-10-CM | POA: Diagnosis not present

## 2023-06-04 NOTE — Progress Notes (Signed)
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  Last week we aspirated a very large amount of fluid out of his thigh posteriorly.  He does have a known chronic infection of a hip replacement with retained hardware.  Once again I was able to aspirate a very large seroma from the back of his thigh.  It was likely several 100 cc of fluid.  This gave him immediate relief.  Will see him back next week for likely repeat aspiration.

## 2023-06-11 ENCOUNTER — Telehealth: Payer: Self-pay | Admitting: Orthopaedic Surgery

## 2023-06-11 ENCOUNTER — Ambulatory Visit: Payer: PPO | Admitting: Orthopaedic Surgery

## 2023-06-11 DIAGNOSIS — T792XXD Traumatic secondary and recurrent hemorrhage and seroma, subsequent encounter: Secondary | ICD-10-CM | POA: Diagnosis not present

## 2023-06-11 NOTE — Telephone Encounter (Signed)
Pt had an appt today with Magnus Ivan. Pt needs next appt for 9/11 morning 10 am. Please call pt to confirm and open slot. Pt phone number is [phone].

## 2023-06-11 NOTE — Telephone Encounter (Signed)
Pt needs appt for

## 2023-06-11 NOTE — Progress Notes (Signed)
The patient comes in today for third week in a row as it relates to a chronic left hip infection and now a recurrent large seroma in his posterior thigh.  On exam you can see there is a reaccumulation of fluid again.  There is no redness in his anterior hip incision is well-healed with no cellulitis.  There is no induration of the skin.  We were able to aspirate 420 cc of fluid again from his posterior thigh.  This time we placed a compressive dressing around this and would like him to continue this on a daily basis.  We will see him back in 1 week to consider another aspiration.  He will likely continue to have recurrent seromas given his chronic prosthetic joint infection.

## 2023-06-18 ENCOUNTER — Encounter: Payer: Self-pay | Admitting: Orthopaedic Surgery

## 2023-06-18 ENCOUNTER — Ambulatory Visit: Payer: PPO | Admitting: Orthopaedic Surgery

## 2023-06-18 DIAGNOSIS — T792XXD Traumatic secondary and recurrent hemorrhage and seroma, subsequent encounter: Secondary | ICD-10-CM

## 2023-06-18 NOTE — Progress Notes (Signed)
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  On exam his wound is much better.  We were still able to aspirate 360+ cc of seroma fluid from his posterior thigh area.  The skin itself again looks better.  He is going to continue to wear compressive garment to help with this.  We need to see him back in 1 week for recurrent seroma and recurrent aspiration of this.

## 2023-06-25 ENCOUNTER — Ambulatory Visit: Payer: PPO | Admitting: Orthopaedic Surgery

## 2023-06-25 DIAGNOSIS — T792XXD Traumatic secondary and recurrent hemorrhage and seroma, subsequent encounter: Secondary | ICD-10-CM | POA: Diagnosis not present

## 2023-06-25 NOTE — Progress Notes (Signed)
[name] comes in today to once again have a large seroma aspirated from the back of his left thigh.  He still has the recurrent seroma back there.  The skin itself though looks much better and he feels much better.  He still wearing compressive garments.  He does have a chronic infection that he is dealt with with a prosthetic joint on that left side.  He denies any fever and chills or worsening pain.  I was able to aspirate another 360 cc of seroma fluid from the right posterior thigh area.  Will see him back next week for repeat serial aspirations until this hopefully resolves.

## 2023-07-02 ENCOUNTER — Ambulatory Visit: Payer: PPO | Admitting: Orthopaedic Surgery

## 2023-07-02 DIAGNOSIS — T792XXD Traumatic secondary and recurrent hemorrhage and seroma, subsequent encounter: Secondary | ICD-10-CM | POA: Diagnosis not present

## 2023-07-02 NOTE — Progress Notes (Signed)
The patient comes in today for continued serial aspirations of a large seroma within his left posterior thigh.  The fluid is reaccumulated again.  This will be the fifth time that we have drained fluid from this area.  He does have a known chronic infection of a prosthetic hip joint.  I was able to aspirate 420 cc of fluid consistent with a seroma from his left posterior thigh.  I have recommended an open incision and drainage with leaving drains in his left thigh but he would like to wait until a little bit later in October after he gets through a church barbecue.  With that being said, we will see him back next week for serial aspirations again from that left eye.

## 2023-07-09 ENCOUNTER — Ambulatory Visit: Payer: PPO | Admitting: Orthopaedic Surgery

## 2023-07-09 ENCOUNTER — Encounter: Payer: Self-pay | Admitting: Orthopaedic Surgery

## 2023-07-09 DIAGNOSIS — T792XXD Traumatic secondary and recurrent hemorrhage and seroma, subsequent encounter: Secondary | ICD-10-CM

## 2023-07-09 NOTE — Progress Notes (Signed)
The patient comes in today due to recurrent seroma in his left thigh.  This is the same side that he has chronic infection from retained orthopedic total joint revision arthroplasty.  We have drained a large seroma off of his hip for several weeks in a row now once weekly.  He has had no acute changes in medical status otherwise.  The back of his thigh still shows a very large seroma.  There is no redness.  There is no induration of the skin.  I was able to aspirate 6 large 60 cc syringes full of seroma type fluid.  We talked about the possibility at some point of surgical intervention.  Right now he is denying any fever and chills or other issues.  Will see him back next week for repeat aspiration.

## 2023-07-16 ENCOUNTER — Ambulatory Visit: Payer: PPO | Admitting: Orthopaedic Surgery

## 2023-07-16 ENCOUNTER — Encounter: Payer: Self-pay | Admitting: Orthopaedic Surgery

## 2023-07-16 DIAGNOSIS — T792XXD Traumatic secondary and recurrent hemorrhage and seroma, subsequent encounter: Secondary | ICD-10-CM | POA: Diagnosis not present

## 2023-07-16 DIAGNOSIS — M009 Pyogenic arthritis, unspecified: Secondary | ICD-10-CM

## 2023-07-16 NOTE — Progress Notes (Signed)
[name] comes in today for repeat aspiration of a large posterior thigh seroma.  This is an area that has a chronically infected total joint replacement.  He is talked about going down on his antibiotics and seeing how this will affect the continued reaccumulation of the seroma.  This is probably the fifth or sixth time that we have drained fluid from this area.  We talked about the possibly of surgery.  He still denies any fever or chills or any other illnesses.  On exam he does have a large reaccumulation of his seroma and I did aspirate over 400 cc of fluid from around his posterior thigh.  Will see him back in 2 weeks for repeat assessment and aspiration.

## 2023-07-30 ENCOUNTER — Encounter: Payer: Self-pay | Admitting: Orthopaedic Surgery

## 2023-07-30 ENCOUNTER — Ambulatory Visit: Payer: PPO | Admitting: Orthopaedic Surgery

## 2023-07-30 DIAGNOSIS — T792XXD Traumatic secondary and recurrent hemorrhage and seroma, subsequent encounter: Secondary | ICD-10-CM

## 2023-07-30 NOTE — Progress Notes (Signed)
The patient comes in for continued follow-up as a relates to recurrent seroma in the thigh of someone who has a chronically infected hip replacement.  He tried to back down on antibiotics but the swelling and drainage got worse so he went back to his regular course of antibiotics.  On exam he has a large seroma again in the back of his thigh.  The skin is little bit more indurated which is worrisome.  I talked to him in length in detail again about an operative intervention which would still only temporize the situation but he would need an open incision and drainage of this area within placing drains and having in the hospital for several days on IV antibiotics and drains in place.  I was able to aspirate probably around 500 cc of fluid from his thigh.  Will see him back in just 5 days for repeat aspiration.

## 2023-08-04 ENCOUNTER — Ambulatory Visit: Payer: PPO | Admitting: Orthopaedic Surgery

## 2023-08-04 ENCOUNTER — Encounter: Payer: Self-pay | Admitting: Orthopaedic Surgery

## 2023-08-04 DIAGNOSIS — M009 Pyogenic arthritis, unspecified: Secondary | ICD-10-CM

## 2023-08-04 DIAGNOSIS — T8452XS Infection and inflammatory reaction due to internal left hip prosthesis, sequela: Secondary | ICD-10-CM | POA: Diagnosis not present

## 2023-08-04 DIAGNOSIS — T792XXD Traumatic secondary and recurrent hemorrhage and seroma, subsequent encounter: Secondary | ICD-10-CM | POA: Diagnosis not present

## 2023-08-04 NOTE — Progress Notes (Signed)
The patient comes in today with recurrent seroma as it relates to chronic infection of his left thigh.  I was able to aspirate again a large abundant amount of fluid from the posterior thigh.  It was at least 6 syringes full of fluid which each had 60 cc of fluid.  At this point I have recommended an open exploration of his thigh with washing out the tissue and placing drains.  I would then need him in the hospital for several days after that depending on her intraoperative findings.  We would have him on IV antibiotics after this as well.  Will work on getting him scheduled for likely Friday, November 8 for this surgery.  We will be in touch.

## 2023-08-06 NOTE — Progress Notes (Signed)
Surgery orders requested via Epic inbox. °

## 2023-08-06 NOTE — Patient Instructions (Signed)
SURGICAL WAITING ROOM VISITATION Patients having surgery or a procedure may have no more than 2 support people in the waiting area - these visitors may rotate.    Children under the age of 69 must have an adult with them who is not the patient.  If the patient needs to stay at the hospital during part of their recovery, the visitor guidelines for inpatient rooms apply. Pre-op nurse will coordinate an appropriate time for 1 support person to accompany patient in pre-op.  This support person may not rotate.    Please refer to the [facility] website for the visitor guidelines for Inpatients (after your surgery is over and you are in a regular room).       Your procedure is scheduled on: 08-15-23   Report to [facility] Main Entrance    Report to admitting at  1:00 PM   Call this number if you have problems the morning of surgery [phone]   Do not eat food :After Midnight.   After Midnight you may have the following liquids until 12:15 PM DAY OF SURGERY  Water Non-Citrus Juices (without pulp, NO RED-Apple, White grape, White cranberry) Black Coffee (NO MILK/CREAM OR CREAMERS, sugar ok)  Clear Tea (NO MILK/CREAM OR CREAMERS, sugar ok) regular and decaf                             Plain Jell-O (NO RED)                                           Fruit ices (not with fruit pulp, NO RED)                                     Popsicles (NO RED)                                                               Sports drinks like Gatorade (NO RED)                   The day of surgery:  Drink ONE (1) Pre-Surgery Clear Ensure by 12:15 PM the morning of surgery. Drink in one sitting. Do not sip.  This drink was given to you during your hospital  pre-op appointment visit. Nothing else to drink after completing the Pre-Surgery Clear Ensure          If you have questions, please contact your surgeon's office.   FOLLOW  ANY ADDITIONAL PRE OP INSTRUCTIONS YOU RECEIVED FROM YOUR  SURGEON'S OFFICE!!!     Oral Hygiene is also important to reduce your risk of infection.                                    Remember - BRUSH YOUR TEETH THE MORNING OF SURGERY WITH YOUR REGULAR TOOTHPASTE   Do NOT smoke after Midnight   Take these medicines the morning of surgery with A SIP OF WATER:   Atorvastatin  Cefadroxil  Solifenacin  Tylenol if needed  Stop all vitamins and herbal supplements 7 days before surgery  Bring CPAP mask and tubing day of surgery.                              You may not have any metal on your body including jewelry, and body piercing             Do not wear lotions, powders,  cologne, or deodorant              Men may shave face and neck.   Do not bring valuables to the hospital. [facility] IS NOT RESPONSIBLE   FOR VALUABLES.   Contacts, dentures or bridgework may not be worn into surgery.   Bring small overnight bag day of surgery.   DO NOT BRING YOUR HOME MEDICATIONS TO THE HOSPITAL. PHARMACY WILL DISPENSE MEDICATIONS LISTED ON YOUR MEDICATION LIST TO YOU DURING YOUR ADMISSION IN THE HOSPITAL!    Special Instructions: Bring a copy of your healthcare power of attorney and living will documents the day of surgery if you haven't scanned them before.              Please read over the following fact sheets you were given: IF YOU HAVE QUESTIONS ABOUT YOUR PRE-OP INSTRUCTIONS PLEASE CALL [phone]   If you received a COVID test during your pre-op visit  it is requested that you wear a mask when out in public, stay away from anyone that may not be feeling well and notify your surgeon if you develop symptoms. If you test positive for Covid or have been in contact with anyone that has tested positive in the last 10 days please notify you surgeon.  [facility] - Preparing for Surgery Before surgery, you can play an important role.  Because skin is not sterile, your skin needs to be as free of germs as possible.  You can reduce the number of germs  on your skin by washing with CHG (chlorahexidine gluconate) soap before surgery.  CHG is an antiseptic cleaner which kills germs and bonds with the skin to continue killing germs even after washing. Please DO NOT use if you have an allergy to CHG or antibacterial soaps.  If your skin becomes reddened/irritated stop using the CHG and inform your nurse when you arrive at Short Stay. Do not shave (including legs and underarms) for at least 48 hours prior to the first CHG shower.  You may shave your face/neck.  Please follow these instructions carefully:  1.  Shower with CHG Soap the night before surgery and the  morning of surgery.  2.  If you choose to wash your hair, wash your hair first as usual with your normal  shampoo.  3.  After you shampoo, rinse your hair and body thoroughly to remove the shampoo.                             4.  Use CHG as you would any other liquid soap.  You can apply chg directly to the skin and wash.  Gently with a scrungie or clean washcloth.  5.  Apply the CHG Soap to your body ONLY FROM THE NECK DOWN.   Do   not use on face/ open  Wound or open sores. Avoid contact with eyes, ears mouth and   genitals (private parts).                       Wash face,  Genitals (private parts) with your normal soap.             6.  Wash thoroughly, paying special attention to the area where your    surgery  will be performed.  7.  Thoroughly rinse your body with warm water from the neck down.  8.  DO NOT shower/wash with your normal soap after using and rinsing off the CHG Soap.                9.  Pat yourself dry with a clean towel.            10.  Wear clean pajamas.            11.  Place clean sheets on your bed the night of your first shower and do not  sleep with pets. Day of Surgery : Do not apply any lotions/deodorants the morning of surgery.  Please wear clean clothes to the hospital/surgery center.  FAILURE TO FOLLOW THESE INSTRUCTIONS MAY RESULT IN  THE CANCELLATION OF YOUR SURGERY  PATIENT SIGNATURE_________________________________  NURSE SIGNATURE__________________________________  ________________________________________________________________________

## 2023-08-07 ENCOUNTER — Encounter (HOSPITAL_COMMUNITY)
Admission: RE | Admit: 2023-08-07 | Discharge: 2023-08-07 | Disposition: A | Discharge status: Home / Self Care | Payer: PPO | Source: Ambulatory Visit | Attending: Orthopaedic Surgery | Admitting: Orthopaedic Surgery

## 2023-08-07 ENCOUNTER — Other Ambulatory Visit: Payer: Self-pay

## 2023-08-07 ENCOUNTER — Encounter (HOSPITAL_COMMUNITY): Payer: Self-pay

## 2023-08-07 VITALS — BP 135/67 | HR 56 | Temp 98.0°F | Resp 16 | Ht 72.0 in | Wt 254.2 lb

## 2023-08-07 DIAGNOSIS — Z01818 Encounter for other preprocedural examination: Secondary | ICD-10-CM | POA: Diagnosis present

## 2023-08-07 DIAGNOSIS — N289 Disorder of kidney and ureter, unspecified: Secondary | ICD-10-CM

## 2023-08-07 DIAGNOSIS — Z01812 Encounter for preprocedural laboratory examination: Secondary | ICD-10-CM | POA: Insufficient documentation

## 2023-08-07 LAB — CBC
HCT: 45.1 % (ref 39.0–52.0)
Hemoglobin: 14.2 g/dL (ref 13.0–17.0)
MCH: 29.3 pg (ref 26.0–34.0)
MCHC: 31.5 g/dL (ref 30.0–36.0)
MCV: 93 fL (ref 80.0–100.0)
Platelets: 251 10*3/uL (ref 150–400)
RBC: 4.85 MIL/uL (ref 4.22–5.81)
RDW: 14.7 % (ref 11.5–15.5)
WBC: 5.9 10*3/uL (ref 4.0–10.5)
nRBC: 0 % (ref 0.0–0.2)

## 2023-08-07 LAB — COMPREHENSIVE METABOLIC PANEL
ALT: 13 U/L (ref 0–44)
AST: 14 U/L — ABNORMAL LOW (ref 15–41)
Albumin: 3.4 g/dL — ABNORMAL LOW (ref 3.5–5.0)
Alkaline Phosphatase: 71 U/L (ref 38–126)
Anion gap: 7 (ref 5–15)
BUN: 21 mg/dL (ref 8–23)
CO2: 25 mmol/L (ref 22–32)
Calcium: 9.9 mg/dL (ref 8.9–10.3)
Chloride: 107 mmol/L (ref 98–111)
Creatinine, Ser: 0.97 mg/dL (ref 0.61–1.24)
GFR, Estimated: >60 mL/min (ref >60)
Glucose, Bld: 109 mg/dL — ABNORMAL HIGH (ref 70–99)
Potassium: 4.4 mmol/L (ref 3.5–5.1)
Sodium: 139 mmol/L (ref 135–145)
Total Bilirubin: 0.6 mg/dL (ref 0.3–1.2)
Total Protein: 6.6 g/dL (ref 6.5–8.1)

## 2023-08-07 NOTE — Progress Notes (Addendum)
PCP - Nicoletta Ba ,MD Cardiologist - no  PPM/ICD -  Device Orders -  Rep Notified -   Chest x-ray -  EKG - 01-02-23 epic Stress Test -  ECHO -  Cardiac Cath -   Sleep Study -  CPAP -   Fasting Blood Sugar -  Checks Blood Sugar _____ times a day  Blood Thinner Instructions: Aspirin Instructions:  ERAS Protcol - PRE-SURGERY Ensure    COVID vaccine -yes  Activity--Able to complete ADL's without SOB or CP Anesthesia review:   Patient denies shortness of breath, fever, cough and chest pain at PAT appointment   All instructions explained to the patient, with a verbal understanding of the material. Patient agrees to go over the instructions while at home for a better understanding. Patient also instructed to self quarantine after being tested for COVID-19. The opportunity to ask questions was provided.

## 2023-08-12 ENCOUNTER — Telehealth: Payer: Self-pay | Admitting: Internal Medicine

## 2023-08-12 NOTE — Telephone Encounter (Signed)
Please send him MyChart message  He can reschedule at his convenience  He can also request the primary team to talk to consult is service as well  I am not on service  Thanks

## 2023-08-12 NOTE — Telephone Encounter (Signed)
[name] called to cancel his upcoming appointment on 11/12 due to a scheduled hospitalization. Patient requested Dr. Renold Don to see him during rounds if he will be in the hospital. He will reschedule based on discharge summary.

## 2023-08-14 DIAGNOSIS — T792XXD Traumatic secondary and recurrent hemorrhage and seroma, subsequent encounter: Secondary | ICD-10-CM

## 2023-08-14 NOTE — H&P (Signed)
[name] is an 77 y.o. male.   Chief Complaint:  left thigh swelling with recurrent fluid collection HPI: The patient has a long and complicated history as it relates to his left hip.  He has a prosthetic hip that is undergone numerous surgeries and revisions secondary to an original total hip arthroplasty that felt significantly followed by several revision surgeries that have led to chronic infection.  Over the years she has had multiple irrigation and debridements of his hip with the plan of chronic suppression of the infection.  The patient does not want the components removed and I believe at this standpoint the only way to clear or eradicate the infection would be to permanently remove all components.  Over the last 2 months he has developed a recurrent large seroma with his posterior left thigh.  He has been in the office clinic setting at least 5-6 different times almost on a weekly basis to have a large and abundant amount of fluid aspirated from his posterior thigh.  On each occasion we have aspirated at least 450 cc of fluid.  At this point we are recommending an open incision and drainage with the hopes of keeping the seroma from reoccurring.  He understands that we will keep him in the hospital for several days with drains in place as well.  Past Medical History:  Diagnosis Date   Anterior dislocation of right shoulder 09/2013   Reduced in ED under sedation.  Dislocated posteriorly 2015, ? partial RC tear, has f/u planned with Dr. August Saucer--? surgery? possible plan for 2016.   Blood donor, whole blood    Has donated approx 30 gallons in his lifetime   Chronic left hip pain    Severe DJD.  THA 2008.  Left acetabular revision in 2017 AND 2019.   Chronic pain syndrome    Candidate for spinal cord stimulator (03/2017)   Chronic renal insufficiency, stage II (mild)    Chronic venous insufficiency    +compression hose   DDD (degenerative disc disease), lumbar 2017   Dr. Joette Catching  ortho.  He is now wearing a new leg brace.  Has had ESI and will get another.   Diverticulosis    +redundant colon   DVT (deep venous thrombosis) (HCC) 06/14/2016   provoked, bilateral LL's; in post-op setting s/p hip surgery.  Xarelto x 68mo at full dosing, then 6 more months at 1/2 dosing, then stopped xarelto.     Foot drop, left    s/p hip fracture 1969   Hx of adenomatous colonic polyps    polypectomy 2007; 2012; 2015; 05/2019; recall 25yrs   Hypercholesterolemia 02/2017   Recommended statin 02/10/17 but pt declined, wanted to do TLC trial first. Rec'd statin 04/2020   IFG (impaired fasting glucose)    04/2023 fasting 111, a1c 5.7%   Osteoarthritis    knees and hips primarily   Post laminectomy syndrome    improved with spinal cord stim-->stim to be removed summer/fall 2019.   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 PSA 0.16 Feb 2020 urol f/u. [name] Aug 2021 urol. <0.015 02/2021.   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; 02/2020   MSSA; admitted for I&D 02/11/20->plan for longterm IV abx after   Urge incontinence    vesicare helpful    Past Surgical History:  Procedure Laterality Date   ANTERIOR HIP REVISION Left 06/07/2016   Procedure: LEFT  ANTERIOR HIP ACETABULAR REVISION;  Surgeon: Kathryne Hitch, MD;  Location: [facility];  Service: Orthopedics;  Laterality: Left;   ANTERIOR HIP REVISION Left 10/09/2017   Procedure: Left hip acetabular revision;  Surgeon: Kathryne Hitch, MD;  Location: [facility];  Service: Orthopedics;  Laterality: Left;   arthroscopy left shoulder Left 2007   bilateral carpal tunnel release  2003   BILATERAL CARPAL TUNNEL RELEASE     COLONOSCOPY  2007;2012;2015;05/2019   04/12/2014 Tubular adenoma x 1; 05/2019 adenomatous polyp; recall 5 yrs.   FRACTURE SURGERY Left    hip   INCISION AND DRAINAGE HIP Left 02/11/2020    Procedure: IRRIGATION AND DEBRIDEMENT LEFT HIP WITH ANTIBIOTIC SPACER PLACEMENT;  Surgeon: Kathryne Hitch, MD;  Location: [facility];  Service: Orthopedics;  Laterality: Left;   INCISION AND DRAINAGE HIP Left 01/03/2023   Procedure: IRRIGATION AND DEBRIDEMENT LEFT HIP;  Surgeon: Kathryne Hitch, MD;  Location: [facility];  Service: Orthopedics;  Laterality: Left;   JOINT REPLACEMENT Left    hip   LUMBAR LAMINECTOMY/DECOMPRESSION MICRODISCECTOMY Left 03/28/2016   Procedure: Left Lumbar three-four Laminoforaminotomy;  Surgeon: Maeola Harman, MD;  Location: [facility];  Service: Neurosurgery;  Laterality: Left;  left   POLYPECTOMY     RADIOACTIVE SEED IMPLANT  2012   ROTATOR CUFF REPAIR  2006   right   seed implant prostate  2012   SHOULDER ARTHROSCOPY Left    SPINAL CORD STIMULATOR IMPLANT  06/2017   removed a year after was putted in.   TOTAL HIP ARTHROPLASTY  2008   left ([employer])   VASECTOMY      Family History  Problem Relation Age of Onset   Lung cancer Mother    Heart disease Father    Colon cancer Neg Hx    Esophageal cancer Neg Hx    Rectal cancer Neg Hx    Stomach cancer Neg Hx    Colon polyps Neg Hx    Social History:  reports that he has never smoked. He has never used smokeless tobacco. He reports that he does not drink alcohol and does not use drugs.  Allergies:  Allergies  Allergen Reactions   Adhesive [Tape] Rash    Paper tape is ok    No medications prior to admission.    No results found for this or any previous visit (from the past 48 hour(s)). No results found.  Review of Systems  There were no vitals taken for this visit. Physical Exam Vitals reviewed.  Constitutional:      Appearance: Normal appearance.  HENT:     Head: Normocephalic and atraumatic.  Eyes:     Extraocular Movements: Extraocular movements intact.     Pupils: Pupils are equal, round, and reactive to light.  Cardiovascular:     Rate and Rhythm: Normal rate.   Pulmonary:     Effort: Pulmonary effort is normal.     Breath sounds: Normal breath sounds.  Abdominal:     Palpations: Abdomen is soft.  Musculoskeletal:     Cervical back: Normal range of motion and neck supple.     Left upper leg: Swelling and edema present.  Neurological:     Mental Status: He is alert and oriented to person, place, and time.  Psychiatric:        Behavior: Behavior normal.      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  Following this he will be admitted for IV antibiotics and to keep the drains in place for multiple days.  The patient understands this plan fully and agrees with proceeding with surgery at this standpoint.  Kathryne Hitch, MD 08/14/2023, 6:44 PM

## 2023-08-15 ENCOUNTER — Encounter (HOSPITAL_COMMUNITY)
Admission: RE | Disposition: A | Discharge status: Home / Self Care | Payer: Self-pay | Source: Home / Self Care | Attending: Orthopaedic Surgery

## 2023-08-15 ENCOUNTER — Other Ambulatory Visit: Payer: Self-pay

## 2023-08-15 ENCOUNTER — Inpatient Hospital Stay (HOSPITAL_COMMUNITY)
Admission: RE | Admit: 2023-08-15 | Discharge: 2023-08-20 | DRG: 908 | Disposition: A | Discharge status: Home / Self Care | Payer: PPO | Attending: Orthopaedic Surgery | Admitting: Orthopaedic Surgery

## 2023-08-15 ENCOUNTER — Inpatient Hospital Stay (HOSPITAL_COMMUNITY): Payer: PPO | Admitting: Anesthesiology

## 2023-08-15 ENCOUNTER — Encounter (HOSPITAL_COMMUNITY): Payer: Self-pay | Admitting: Orthopaedic Surgery

## 2023-08-15 ENCOUNTER — Inpatient Hospital Stay (HOSPITAL_COMMUNITY): Payer: PPO

## 2023-08-15 DIAGNOSIS — M96842 Postprocedural seroma of a musculoskeletal structure following a musculoskeletal system procedure: Principal | ICD-10-CM | POA: Diagnosis present

## 2023-08-15 DIAGNOSIS — Z96642 Presence of left artificial hip joint: Secondary | ICD-10-CM | POA: Diagnosis present

## 2023-08-15 DIAGNOSIS — M009 Pyogenic arthritis, unspecified: Secondary | ICD-10-CM | POA: Diagnosis not present

## 2023-08-15 DIAGNOSIS — Z792 Long term (current) use of antibiotics: Secondary | ICD-10-CM

## 2023-08-15 DIAGNOSIS — Z8249 Family history of ischemic heart disease and other diseases of the circulatory system: Secondary | ICD-10-CM | POA: Diagnosis not present

## 2023-08-15 DIAGNOSIS — T792XXD Traumatic secondary and recurrent hemorrhage and seroma, subsequent encounter: Secondary | ICD-10-CM | POA: Diagnosis not present

## 2023-08-15 DIAGNOSIS — Z8546 Personal history of malignant neoplasm of prostate: Secondary | ICD-10-CM | POA: Diagnosis not present

## 2023-08-15 DIAGNOSIS — G894 Chronic pain syndrome: Secondary | ICD-10-CM | POA: Diagnosis not present

## 2023-08-15 DIAGNOSIS — I96 Gangrene, not elsewhere classified: Secondary | ICD-10-CM | POA: Diagnosis present

## 2023-08-15 DIAGNOSIS — Z9682 Presence of neurostimulator: Secondary | ICD-10-CM | POA: Diagnosis not present

## 2023-08-15 DIAGNOSIS — L7634 Postprocedural seroma of skin and subcutaneous tissue following other procedure: Secondary | ICD-10-CM | POA: Diagnosis not present

## 2023-08-15 DIAGNOSIS — T792XXA Traumatic secondary and recurrent hemorrhage and seroma, initial encounter: Secondary | ICD-10-CM | POA: Diagnosis not present

## 2023-08-15 DIAGNOSIS — Z8781 Personal history of (healed) traumatic fracture: Secondary | ICD-10-CM | POA: Diagnosis not present

## 2023-08-15 DIAGNOSIS — Z860101 Personal history of adenomatous and serrated colon polyps: Secondary | ICD-10-CM | POA: Diagnosis not present

## 2023-08-15 DIAGNOSIS — N182 Chronic kidney disease, stage 2 (mild): Secondary | ICD-10-CM | POA: Diagnosis present

## 2023-08-15 DIAGNOSIS — Y831 Surgical operation with implant of artificial internal device as the cause of abnormal reaction of the patient, or of later complication, without mention of misadventure at the time of the procedure: Secondary | ICD-10-CM | POA: Diagnosis present

## 2023-08-15 DIAGNOSIS — Z801 Family history of malignant neoplasm of trachea, bronchus and lung: Secondary | ICD-10-CM | POA: Diagnosis not present

## 2023-08-15 DIAGNOSIS — T8450XD Infection and inflammatory reaction due to unspecified internal joint prosthesis, subsequent encounter: Secondary | ICD-10-CM

## 2023-08-15 DIAGNOSIS — N289 Disorder of kidney and ureter, unspecified: Secondary | ICD-10-CM

## 2023-08-15 DIAGNOSIS — Z01818 Encounter for other preprocedural examination: Principal | ICD-10-CM

## 2023-08-15 DIAGNOSIS — Z91048 Other nonmedicinal substance allergy status: Secondary | ICD-10-CM

## 2023-08-15 HISTORY — PX: INCISION AND DRAINAGE: SHX5863

## 2023-08-15 SURGERY — INCISION AND DRAINAGE
Anesthesia: General | Laterality: Left

## 2023-08-15 MED ORDER — METHOCARBAMOL 500 MG PO TABS
500.0000 mg | ORAL_TABLET | Freq: Four times a day (QID) | ORAL | Status: DC | PRN
  Administered 2023-08-15 – 2023-08-18 (×4): 500 mg via ORAL
  Filled 2023-08-15 (×4): qty 1

## 2023-08-15 MED ORDER — METOCLOPRAMIDE HCL 5 MG PO TABS: 5.0000 mg | ORAL_TABLET | Freq: Three times a day (TID) | ORAL | Status: DC | PRN

## 2023-08-15 MED ORDER — LIDOCAINE 2% (20 MG/ML) 5 ML SYRINGE
INTRAMUSCULAR | Status: DC | PRN
  Administered 2023-08-15: 100 mg via INTRAVENOUS

## 2023-08-15 MED ORDER — ACETAMINOPHEN 500 MG PO TABS
1000.0000 mg | ORAL_TABLET | Freq: Once | ORAL | Status: AC
  Administered 2023-08-15: 1000 mg via ORAL
  Filled 2023-08-15: qty 2

## 2023-08-15 MED ORDER — PROPOFOL 10 MG/ML IV BOLUS
INTRAVENOUS | Status: DC | PRN
  Administered 2023-08-15: 170 mg via INTRAVENOUS

## 2023-08-15 MED ORDER — ONDANSETRON HCL 4 MG/2ML IJ SOLN
INTRAMUSCULAR | Status: AC
  Filled 2023-08-15: qty 2

## 2023-08-15 MED ORDER — MORPHINE SULFATE (PF) 2 MG/ML IV SOLN
0.5000 mg | INTRAVENOUS | Status: DC | PRN
Start: 2023-08-15 — End: 2023-08-20

## 2023-08-15 MED ORDER — PROPOFOL 10 MG/ML IV BOLUS
INTRAVENOUS | Status: AC
Start: 2023-08-15 — End: ?
  Filled 2023-08-15: qty 20

## 2023-08-15 MED ORDER — HYDROCODONE-ACETAMINOPHEN 5-325 MG PO TABS
1.0000 | ORAL_TABLET | ORAL | Status: DC | PRN
  Administered 2023-08-15 (×2): 2 via ORAL
  Administered 2023-08-16 – 2023-08-19 (×5): 1 via ORAL
  Administered 2023-08-19: 2 via ORAL
  Filled 2023-08-15 (×3): qty 1
  Filled 2023-08-15: qty 2
  Filled 2023-08-15 (×2): qty 1
  Filled 2023-08-15 (×2): qty 2

## 2023-08-15 MED ORDER — DIPHENHYDRAMINE HCL 12.5 MG/5ML PO ELIX: 12.5000 mg | ORAL_SOLUTION | ORAL | Status: DC | PRN

## 2023-08-15 MED ORDER — FENTANYL CITRATE (PF) 100 MCG/2ML IJ SOLN
INTRAMUSCULAR | Status: AC
  Filled 2023-08-15: qty 2

## 2023-08-15 MED ORDER — BUPIVACAINE HCL 0.25 % IJ SOLN
INTRAMUSCULAR | Status: AC
  Filled 2023-08-15: qty 1

## 2023-08-15 MED ORDER — METOCLOPRAMIDE HCL 5 MG/ML IJ SOLN: 5.0000 mg | Freq: Three times a day (TID) | INTRAMUSCULAR | Status: DC | PRN

## 2023-08-15 MED ORDER — GLYCOPYRROLATE 0.2 MG/ML IJ SOLN
INTRAMUSCULAR | Status: DC | PRN
  Administered 2023-08-15: .2 mg via INTRAVENOUS

## 2023-08-15 MED ORDER — ATORVASTATIN CALCIUM 20 MG PO TABS
20.0000 mg | ORAL_TABLET | Freq: Every day | ORAL | Status: DC
  Administered 2023-08-16 – 2023-08-19 (×4): 20 mg via ORAL
  Filled 2023-08-15 (×4): qty 1

## 2023-08-15 MED ORDER — FENTANYL CITRATE PF 50 MCG/ML IJ SOSY
25.0000 ug | PREFILLED_SYRINGE | INTRAMUSCULAR | Status: DC | PRN
  Administered 2023-08-15 (×2): 50 ug via INTRAVENOUS

## 2023-08-15 MED ORDER — ONDANSETRON HCL 4 MG/2ML IJ SOLN: 4.0000 mg | Freq: Four times a day (QID) | INTRAMUSCULAR | Status: DC | PRN

## 2023-08-15 MED ORDER — LACTATED RINGERS IV SOLN: INTRAVENOUS | Status: DC

## 2023-08-15 MED ORDER — ORAL CARE MOUTH RINSE: 15.0000 mL | Freq: Once | OROMUCOSAL | Status: AC

## 2023-08-15 MED ORDER — DEXAMETHASONE SODIUM PHOSPHATE 10 MG/ML IJ SOLN
INTRAMUSCULAR | Status: AC
Start: 2023-08-15 — End: ?
  Filled 2023-08-15: qty 1

## 2023-08-15 MED ORDER — TRANEXAMIC ACID-NACL 1000-0.7 MG/100ML-% IV SOLN
1000.0000 mg | INTRAVENOUS | Status: AC
  Administered 2023-08-15: 1000 mg via INTRAVENOUS
  Filled 2023-08-15: qty 100

## 2023-08-15 MED ORDER — HYDROCODONE-ACETAMINOPHEN 7.5-325 MG PO TABS: 1.0000 | ORAL_TABLET | ORAL | Status: DC | PRN

## 2023-08-15 MED ORDER — EPHEDRINE SULFATE-NACL 50-0.9 MG/10ML-% IV SOSY
PREFILLED_SYRINGE | INTRAVENOUS | Status: DC | PRN
  Administered 2023-08-15 (×2): 10 mg via INTRAVENOUS
  Administered 2023-08-15: 5 mg via INTRAVENOUS

## 2023-08-15 MED ORDER — ONDANSETRON HCL 4 MG/2ML IJ SOLN
INTRAMUSCULAR | Status: DC | PRN
  Administered 2023-08-15: 4 mg via INTRAVENOUS

## 2023-08-15 MED ORDER — FENTANYL CITRATE (PF) 100 MCG/2ML IJ SOLN
INTRAMUSCULAR | Status: DC | PRN
  Administered 2023-08-15: 100 ug via INTRAVENOUS

## 2023-08-15 MED ORDER — FENTANYL CITRATE PF 50 MCG/ML IJ SOSY
PREFILLED_SYRINGE | INTRAMUSCULAR | Status: AC
  Administered 2023-08-15: 50 ug via INTRAVENOUS
  Filled 2023-08-15: qty 3

## 2023-08-15 MED ORDER — DEXAMETHASONE SODIUM PHOSPHATE 10 MG/ML IJ SOLN
INTRAMUSCULAR | Status: DC | PRN
  Administered 2023-08-15: 10 mg via INTRAVENOUS

## 2023-08-15 MED ORDER — PHENYLEPHRINE 80 MCG/ML (10ML) SYRINGE FOR IV PUSH (FOR BLOOD PRESSURE SUPPORT)
PREFILLED_SYRINGE | INTRAVENOUS | Status: DC | PRN
  Administered 2023-08-15 (×4): 160 ug via INTRAVENOUS

## 2023-08-15 MED ORDER — METHOCARBAMOL 1000 MG/10ML IJ SOLN: 500.0000 mg | Freq: Four times a day (QID) | INTRAMUSCULAR | Status: DC | PRN

## 2023-08-15 MED ORDER — ASPIRIN 81 MG PO TBEC
81.0000 mg | DELAYED_RELEASE_TABLET | Freq: Every day | ORAL | Status: DC
  Administered 2023-08-16 – 2023-08-19 (×4): 81 mg via ORAL
  Filled 2023-08-15 (×4): qty 1

## 2023-08-15 MED ORDER — FESOTERODINE FUMARATE ER 4 MG PO TB24
4.0000 mg | ORAL_TABLET | Freq: Every day | ORAL | Status: DC
  Administered 2023-08-16 – 2023-08-19 (×4): 4 mg via ORAL
  Filled 2023-08-15 (×5): qty 1

## 2023-08-15 MED ORDER — LIDOCAINE HCL (PF) 2 % IJ SOLN
INTRAMUSCULAR | Status: AC
  Filled 2023-08-15: qty 5

## 2023-08-15 MED ORDER — SODIUM CHLORIDE 0.9 % IV SOLN: INTRAVENOUS | Status: DC

## 2023-08-15 MED ORDER — ONDANSETRON HCL 4 MG PO TABS: 4.0000 mg | ORAL_TABLET | Freq: Four times a day (QID) | ORAL | Status: DC | PRN

## 2023-08-15 MED ORDER — CEFAZOLIN SODIUM-DEXTROSE 2-4 GM/100ML-% IV SOLN
2.0000 g | INTRAVENOUS | Status: AC
  Administered 2023-08-15: 2 g via INTRAVENOUS
  Filled 2023-08-15: qty 100

## 2023-08-15 MED ORDER — ACETAMINOPHEN 325 MG PO TABS
325.0000 mg | ORAL_TABLET | Freq: Four times a day (QID) | ORAL | Status: DC | PRN
  Administered 2023-08-16: 325 mg via ORAL
  Administered 2023-08-17 – 2023-08-20 (×2): 650 mg via ORAL
  Filled 2023-08-15 (×3): qty 2

## 2023-08-15 MED ORDER — DOCUSATE SODIUM 100 MG PO CAPS
100.0000 mg | ORAL_CAPSULE | Freq: Two times a day (BID) | ORAL | Status: DC
  Administered 2023-08-15 – 2023-08-19 (×9): 100 mg via ORAL
  Filled 2023-08-15 (×9): qty 1

## 2023-08-15 MED ORDER — 0.9 % SODIUM CHLORIDE (POUR BTL) OPTIME
TOPICAL | Status: DC | PRN
  Administered 2023-08-15: 3000 mL
  Administered 2023-08-15: 1000 mL

## 2023-08-15 MED ORDER — EPHEDRINE 5 MG/ML INJ
INTRAVENOUS | Status: AC
  Filled 2023-08-15: qty 5

## 2023-08-15 MED ORDER — ONDANSETRON HCL 4 MG/2ML IJ SOLN: 4.0000 mg | Freq: Once | INTRAMUSCULAR | Status: DC | PRN

## 2023-08-15 MED ORDER — CHLORHEXIDINE GLUCONATE 0.12 % MT SOLN
15.0000 mL | Freq: Once | OROMUCOSAL | Status: AC
Start: 2023-08-15 — End: 2023-08-15
  Administered 2023-08-15: 15 mL via OROMUCOSAL

## 2023-08-15 MED ORDER — CEFAZOLIN SODIUM-DEXTROSE 2-4 GM/100ML-% IV SOLN
2.0000 g | Freq: Three times a day (TID) | INTRAVENOUS | Status: DC
  Administered 2023-08-15 – 2023-08-20 (×14): 2 g via INTRAVENOUS
  Filled 2023-08-15 (×14): qty 100

## 2023-08-15 SURGICAL SUPPLY — 45 items
BAG COUNTER SPONGE SURGICOUNT (BAG) IMPLANT
BAG SPEC THK2 15X12 ZIP CLS (MISCELLANEOUS) ×1
BAG SPNG CNTER NS LX DISP (BAG)
BAG ZIPLOCK 12X15 (MISCELLANEOUS) ×1 IMPLANT
BANDAGE ESMARK 6X9 LF (GAUZE/BANDAGES/DRESSINGS) ×1 IMPLANT
BNDG CMPR 9X6 STRL LF SNTH (GAUZE/BANDAGES/DRESSINGS) ×1
BNDG ESMARK 6X9 LF (GAUZE/BANDAGES/DRESSINGS) ×1
BNDG GAUZE DERMACEA FLUFF 4 (GAUZE/BANDAGES/DRESSINGS) ×1 IMPLANT
BNDG GZE DERMACEA 4 6PLY (GAUZE/BANDAGES/DRESSINGS) ×1
CANISTER WOUNDNEG PRESSURE 500 (CANNISTER) IMPLANT
COVER SURGICAL LIGHT HANDLE (MISCELLANEOUS) ×1 IMPLANT
CUFF TOURN SGL QUICK 18X4 (TOURNIQUET CUFF) IMPLANT
CUFF TOURN SGL QUICK 24 (TOURNIQUET CUFF)
CUFF TOURN SGL QUICK 34 (TOURNIQUET CUFF)
CUFF TRNQT CYL 24X4X16.5-23 (TOURNIQUET CUFF) IMPLANT
CUFF TRNQT CYL 34X4.125X (TOURNIQUET CUFF) IMPLANT
DRAIN PENROSE 0.5X18 (DRAIN) ×1 IMPLANT
DRAPE INCISE IOBAN 66X45 STRL (DRAPES) IMPLANT
DRESSING PEEL AND PLAC PRVNA20 (GAUZE/BANDAGES/DRESSINGS) IMPLANT
DRSG PEEL AND PLACE PREVENA 20 (GAUZE/BANDAGES/DRESSINGS) ×1
DURAPREP 26ML APPLICATOR (WOUND CARE) ×1 IMPLANT
ELECT REM PT RETURN 15FT ADLT (MISCELLANEOUS) ×1 IMPLANT
EVACUATOR 1/8 PVC DRAIN (DRAIN) IMPLANT
GAUZE PAD ABD 8X10 STRL (GAUZE/BANDAGES/DRESSINGS) ×2 IMPLANT
GAUZE SPONGE 4X4 12PLY STRL (GAUZE/BANDAGES/DRESSINGS) ×1 IMPLANT
GLOVE BIO SURGEON STRL SZ7.5 (GLOVE) ×1 IMPLANT
GLOVE BIOGEL PI IND STRL 8 (GLOVE) ×1 IMPLANT
GLOVE ECLIPSE 8.0 STRL XLNG CF (GLOVE) ×1 IMPLANT
GOWN STRL REUS W/ TWL XL LVL3 (GOWN DISPOSABLE) ×1 IMPLANT
GOWN STRL REUS W/TWL XL LVL3 (GOWN DISPOSABLE) ×1
HANDPIECE INTERPULSE COAX TIP (DISPOSABLE) ×1
KIT BASIN OR (CUSTOM PROCEDURE TRAY) ×1 IMPLANT
KIT TURNOVER KIT A (KITS) IMPLANT
PACK ORTHO EXTREMITY (CUSTOM PROCEDURE TRAY) ×1 IMPLANT
PAD CAST 4YDX4 CTTN HI CHSV (CAST SUPPLIES) ×1 IMPLANT
PADDING CAST COTTON 4X4 STRL (CAST SUPPLIES) ×1
PROTECTOR NERVE ULNAR (MISCELLANEOUS) ×1 IMPLANT
SET HNDPC FAN SPRY TIP SCT (DISPOSABLE) ×1 IMPLANT
SPONGE T-LAP 18X18 ~~LOC~~+RFID (SPONGE) IMPLANT
SUT ETHILON 2 0 PS N (SUTURE) IMPLANT
SUT VIC AB 1 CT1 36 (SUTURE) IMPLANT
SUT VIC AB 2-0 CT1 27 (SUTURE) ×1
SUT VIC AB 2-0 CT1 TAPERPNT 27 (SUTURE) IMPLANT
SYR CONTROL 10ML LL (SYRINGE) ×1 IMPLANT
TOWEL OR 17X26 10 PK STRL BLUE (TOWEL DISPOSABLE) ×2 IMPLANT

## 2023-08-15 NOTE — Anesthesia Preprocedure Evaluation (Addendum)
Anesthesia Evaluation  Patient identified by MRN, date of birth, ID band Patient awake    Reviewed: Allergy & Precautions, NPO status , Patient's Chart, lab work & pertinent test results  Airway Mallampati: III  TM Distance: >3 FB Neck ROM: Full    Dental  (+) Teeth Intact, Dental Advisory Given   Pulmonary neg pulmonary ROS   Pulmonary exam normal breath sounds clear to auscultation       Cardiovascular Exercise Tolerance: Good + Peripheral Vascular Disease  Normal cardiovascular exam Rhythm:Regular Rate:Normal     Neuro/Psych  Neuromuscular disease  negative psych ROS   GI/Hepatic negative GI ROS, Neg liver ROS,,,  Endo/Other  Obesity   Renal/GU Renal InsufficiencyRenal disease Bladder dysfunction      Musculoskeletal  (+) Arthritis ,  recurrent seroma left thigh   Abdominal   Peds  Hematology negative hematology ROS (+) Plt 251k    Anesthesia Other Findings Day of surgery medications reviewed with the patient.  Reproductive/Obstetrics                             Anesthesia Physical Anesthesia Plan  ASA: 3  Anesthesia Plan: General   Post-op Pain Management: Tylenol PO (pre-op)*   Induction: Intravenous  PONV Risk Score and Plan: 2 and Dexamethasone and Ondansetron  Airway Management Planned: LMA  Additional Equipment:   Intra-op Plan:   Post-operative Plan: Extubation in OR  Informed Consent: I have reviewed the patients History and Physical, chart, labs and discussed the procedure including the risks, benefits and alternatives for the proposed anesthesia with the patient or authorized representative who has indicated his/her understanding and acceptance.     Dental advisory given  Plan Discussed with: CRNA  Anesthesia Plan Comments:         Anesthesia Quick Evaluation

## 2023-08-15 NOTE — Anesthesia Postprocedure Evaluation (Signed)
Anesthesia Post Note  Patient: [name]  Procedure(s) Performed: INCISION AND DRAINAGE OF SEROMA LEFT THIGH (Left)     Patient location during evaluation: PACU Anesthesia Type: General Level of consciousness: awake and alert Pain management: pain level controlled Vital Signs Assessment: post-procedure vital signs reviewed and stable Respiratory status: spontaneous breathing, nonlabored ventilation, respiratory function stable and patient connected to nasal cannula oxygen Cardiovascular status: blood pressure returned to baseline and stable Postop Assessment: no apparent nausea or vomiting Anesthetic complications: no   No notable events documented.  Last Vitals:  Vitals:   08/15/23 1645 08/15/23 1700  BP: 130/63 124/71  Pulse: (!) 48 (!) 47  Resp: 12 13  Temp:    SpO2: 97% 100%    Last Pain:  Vitals:   08/15/23 1700  TempSrc:   PainSc: 4                  Collene Schlichter

## 2023-08-15 NOTE — Transfer of Care (Signed)
Immediate Anesthesia Transfer of Care Note  Patient: [name]  Procedure(s) Performed: INCISION AND DRAINAGE OF SEROMA LEFT THIGH (Left)  Patient Location: PACU  Anesthesia Type:General  Level of Consciousness: sedated  Airway & Oxygen Therapy: Patient Spontanous Breathing and Patient connected to face mask oxygen  Post-op Assessment: Report given to RN and Post -op Vital signs reviewed and stable  Post vital signs: Reviewed and stable  Last Vitals:  Vitals Value Taken Time  BP 119/68 08/15/23 1552  Temp    Pulse 64 08/15/23 1553  Resp 11 08/15/23 1553  SpO2 98 % 08/15/23 1553  Vitals shown include unfiled device data.  Last Pain:  Vitals:   08/15/23 1216  TempSrc:   PainSc: 5       Patients Stated Pain Goal: 3 (08/15/23 1216)  Complications: No notable events documented.

## 2023-08-15 NOTE — Plan of Care (Signed)
  Problem: Education: Goal: Knowledge of General Education information will improve Description: Including pain rating scale, medication(s)/side effects and non-pharmacologic comfort measures Outcome: Progressing   Problem: Health Behavior/Discharge Planning: Goal: Ability to manage health-related needs will improve Outcome: Progressing   Problem: Clinical Measurements: Goal: Ability to maintain clinical measurements within normal limits will improve Outcome: Progressing Goal: Will remain free from infection Outcome: Progressing Goal: Diagnostic test results will improve Outcome: Progressing Goal: Respiratory complications will improve Outcome: Progressing Goal: Cardiovascular complication will be avoided Outcome: Progressing   Problem: Activity: Goal: Risk for activity intolerance will decrease Outcome: Progressing   Problem: Nutrition: Goal: Adequate nutrition will be maintained Outcome: Completed/Met   Problem: Coping: Goal: Level of anxiety will decrease Outcome: Progressing   Problem: Elimination: Goal: Will not experience complications related to bowel motility Outcome: Progressing Goal: Will not experience complications related to urinary retention Outcome: Progressing   Problem: Pain Management: Goal: General experience of comfort will improve Outcome: Progressing   Problem: Safety: Goal: Ability to remain free from injury will improve Outcome: Progressing   Problem: Skin Integrity: Goal: Risk for impaired skin integrity will decrease Outcome: Progressing   Problem: Education: Goal: Knowledge of General Education information will improve Description: Including pain rating scale, medication(s)/side effects and non-pharmacologic comfort measures Outcome: Progressing   Problem: Health Behavior/Discharge Planning: Goal: Ability to manage health-related needs will improve Outcome: Progressing   Problem: Clinical Measurements: Goal: Ability to maintain  clinical measurements within normal limits will improve Outcome: Progressing Goal: Will remain free from infection Outcome: Progressing Goal: Diagnostic test results will improve Outcome: Progressing Goal: Respiratory complications will improve Outcome: Progressing Goal: Cardiovascular complication will be avoided Outcome: Progressing

## 2023-08-15 NOTE — Interval H&P Note (Signed)
History and Physical Interval Note: The patient understands that he is here today for surgery on his left posterior thigh.  He has had a recurrent seroma for many weeks now that we have aspirated in the office numerous times.  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  At this point we feel like we have tried and failed all forms of conservative treatment and it is time to proceed with surgery to open up the thigh and see if we can wash it out thoroughly and placing drains in the thigh to keep this from reoccurring hopefully.  He has had no acute or interval changes in medical status.  The risks and benefits of surgery been discussed in detail and informed consent has been obtained.  The left operative hip has been marked.  08/15/2023 12:34 PM  [name]  has presented today for surgery, with the diagnosis of recurrent seroma left thigh.  The various methods of treatment have been discussed with the patient and family. After consideration of risks, benefits and other options for treatment, the patient has consented to  Procedure(s): INCISION AND DRAINAGE LEFT THIGH (Left) as a surgical intervention.  The patient's history has been reviewed, patient examined, no change in status, stable for surgery.  I have reviewed the patient's chart and labs.  Questions were answered to the patient's satisfaction.     Kathryne Hitch

## 2023-08-15 NOTE — Anesthesia Procedure Notes (Signed)
Procedure Name: LMA Insertion Date/Time: 08/15/2023 2:18 PM  Performed by: Doran Clay, CRNAPre-anesthesia Checklist: Patient identified, Emergency Drugs available, Suction available, Patient being monitored and Timeout performed Patient Re-evaluated:Patient Re-evaluated prior to induction Oxygen Delivery Method: Circle system utilized Preoxygenation: Pre-oxygenation with 100% oxygen Induction Type: IV induction LMA: LMA inserted LMA Size: 5.0 Tube type: Oral Number of attempts: 2 (1st inserted LMA 4, Large leak noted and continued, decision to remove and replace to LMA 5) Placement Confirmation: positive ETCO2 and breath sounds checked- equal and bilateral Tube secured with: Tape Dental Injury: Teeth and Oropharynx as per pre-operative assessment

## 2023-08-15 NOTE — Op Note (Signed)
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 Magnus Ivan, MD Assistant: Rexene Edison, PA-C  Anesthesia: General Antibiotics: 2 g IV Ancef EBL: 200 cc Complications: None  Indications: The patient is a 77 year old gentleman that I have known for many years now.  He originally had a left posterior hip arthroplasty by one of my partners and a femoral component was a DePuy S-ROM component and the acetabular component was an ASR acetabular shell.  The ASR component failed and was involved in recall as well.  He ended up with eventually 2-3 revision surgeries at that time component.  During that time he did unfortunately Velp a chronic infection of the components.  He has been seen by infectious ease of his years and is on chronic antibiotics with the goal of retaining the components.  He does not want these components removed because of the disability this will cause him.  He does not have bone quality that I would ever consider putting any other components back in him.  About 2 months ago he developed a posterior seroma at the posterior thigh after doing a lot of lawnmowing.  I saw him in the office and was able to aspirate 450 cc of fluid from the back of the thigh but then saw him at least 6 additional times for repeat aspirations.  At this point have recommended an incision and drainage with irrigation debridement given the recurrent seromatous bit of reaccumulated on a regular basis.  I did speak with him about the continued risk of infection as well as nerve vessel injury and bleeding.   He understands her goals are hopefully continuing to treat the hip with chronic suppression and keeping the seroma from reoccurring.  Procedure description: After informed consent was obtained and the appropriate left thigh was marked, the patient was brought to the operating room and placed upon the operating table.  General anesthesia was obtained.  He was then fashion into a lateral decubitus position with the left operative side up.  An axillary roll was placed as well as hip positioners in the front and back and appropriate padding of all bony prominences.  His right thigh and hip and just past the knee were prepped and draped with DuraPrep and sterile drapes including a sterile stockinette.  A timeout was called and he identifies correct patient the correct left thigh.  We then made a posterior and slightly lateral based incision near some areas of induration of the skin and where we have been continuing aspirating the lower extremity from the thigh.  We dissected down barely through the subfascial tissue we developed a very large seroma and completely decompressed this area.  We did find necrotic fascia that we sharply excised with a scalpel as well as using a rondure.  We found areas of indurated subfascial tissue which we removed as well and some subcutaneous tissue.  The remaining of the muscle was contractile with cautery stimulation but certainly we did find some necrotic gluteus muscle as well as some of the hamstring muscles.  Once I felt like we had had an adequate decompression and debridement of the necrotic tissue we then thoroughly irrigated 3  L normal saline solution through the lateral and posterior thigh and some anterior that we could not get around to with pulsatile lavage and then additional 1 L of fluid.  We coagulated any bleeding that we get fine with electrocautery.  We then placed a deep drain posteriorly in the subfascial tissue where the fluid was reaccumulating and brought this out  anteriorly.  We then closed in layers for the #1 Vicryl followed by 0 Vicryl and then 2-0 Vicryl.  The skin was closed with interrupted 2-0 nylon.  A Prevena incisional VAC was then placed over the incision which was a closed incision.  This was set to suction and has a good seal.  The patient was then turned back into a supine position.  He was awakened, extubated and taken to recovery room in same condition.  Postoperative he will be admitted as an inpatient for IV antibiotics and to watch the drain for several days.  He can weight-bear as tolerated and be up in the room as tolerated.

## 2023-08-16 ENCOUNTER — Encounter (HOSPITAL_COMMUNITY): Payer: Self-pay | Admitting: Orthopaedic Surgery

## 2023-08-16 LAB — CBC
HCT: 40.5 % (ref 39.0–52.0)
Hemoglobin: 12.5 g/dL — ABNORMAL LOW (ref 13.0–17.0)
MCH: 29.3 pg (ref 26.0–34.0)
MCHC: 30.9 g/dL (ref 30.0–36.0)
MCV: 94.8 fL (ref 80.0–100.0)
Platelets: 244 10*3/uL (ref 150–400)
RBC: 4.27 MIL/uL (ref 4.22–5.81)
RDW: 14.4 % (ref 11.5–15.5)
WBC: 6.1 10*3/uL (ref 4.0–10.5)
nRBC: 0 % (ref 0.0–0.2)

## 2023-08-16 LAB — BASIC METABOLIC PANEL
Anion gap: 8 (ref 5–15)
BUN: 18 mg/dL (ref 8–23)
CO2: 21 mmol/L — ABNORMAL LOW (ref 22–32)
Calcium: 9.3 mg/dL (ref 8.9–10.3)
Chloride: 104 mmol/L (ref 98–111)
Creatinine, Ser: 0.93 mg/dL (ref 0.61–1.24)
GFR, Estimated: >60 mL/min (ref >60)
Glucose, Bld: 142 mg/dL — ABNORMAL HIGH (ref 70–99)
Potassium: 5.4 mmol/L — ABNORMAL HIGH (ref 3.5–5.1)
Sodium: 133 mmol/L — ABNORMAL LOW (ref 135–145)

## 2023-08-16 MED ORDER — MENTHOL 3 MG MT LOZG: 1.0000 | LOZENGE | OROMUCOSAL | Status: DC | PRN

## 2023-08-16 NOTE — Progress Notes (Signed)
Patient ID: [name], male   DOB: 14-Jun-1946, 77 y.o.   MRN: [id number] The patient is awake and alert this morning.  Vital signs are stable.  His labs are stable.  Our plan is to have him here in the hospital for the next 3 to 4 days for IV antibiotics as well as to let the Hemovac continue to drained the deep recurrent seroma as well as have the incisional VAC over his incision.  He will not be going home with a VAC.  He is fine to weight-bear as tolerated into ambulate and mobilize in the room.  We will keep checking on him daily.  He says he feels okay overall.

## 2023-08-17 NOTE — Plan of Care (Signed)

## 2023-08-17 NOTE — Plan of Care (Signed)
  Problem: Health Behavior/Discharge Planning: Goal: Ability to manage health-related needs will improve Outcome: Progressing   Problem: Clinical Measurements: Goal: Ability to maintain clinical measurements within normal limits will improve Outcome: Progressing   Problem: Activity: Goal: Risk for activity intolerance will decrease Outcome: Progressing   Problem: Coping: Goal: Level of anxiety will decrease Outcome: Progressing   Problem: Elimination: Goal: Will not experience complications related to bowel motility Outcome: Progressing   Problem: Pain Management: Goal: General experience of comfort will improve Outcome: Progressing   Problem: Safety: Goal: Ability to remain free from injury will improve Outcome: Progressing   Problem: Skin Integrity: Goal: Risk for impaired skin integrity will decrease Outcome: Progressing

## 2023-08-17 NOTE — Progress Notes (Signed)
Patient ID: [name], male   DOB: [birth year], 77 y.o.   MRN: [id number] The patient is sitting up in the bedside chair.  He is comfortable overall.  There has not been a lot of output from Hemovac or the incisional VAC from his left thigh.  On exam the tissue looks better overall in terms of not the redness that was there.  That has dissipated.  There is not the same firmness and he says it does feel better.  He understands that we are trying to maximize the IV antibiotics and the drains being in place prior to discharge given the chronic nature of his infection and the recurrences that he has had.  I will likely keep him here until Wednesday morning.  We are going to get tomorrow.

## 2023-08-18 ENCOUNTER — Other Ambulatory Visit: Payer: Self-pay

## 2023-08-18 MED ORDER — ORAL CARE MOUTH RINSE: 15.0000 mL | OROMUCOSAL | Status: DC | PRN

## 2023-08-18 NOTE — Progress Notes (Signed)
Patient ID: [name], male   DOB: 1946/08/04, 77 y.o.   MRN: [id number] The patient is awake and alert this morning.  He reports that he has been up and ambulating in the room.  His left thigh Hemovac is not putting out much fluid at this point and the incisional VAC has a good seal.  There is no redness to the leg.  Overall he is improving clinically.  The plan will be to remove the incisional VAC and drain on Wednesday morning and discharged him to home on Wednesday.  He is continue on IV antibiotics and he agrees with this treatment plan as well.

## 2023-08-18 NOTE — Plan of Care (Signed)
  Problem: Coping: Goal: Level of anxiety will decrease Outcome: Progressing   Problem: Pain Management: Goal: General experience of comfort will improve Outcome: Progressing

## 2023-08-18 NOTE — Plan of Care (Signed)
  Problem: Coping: Goal: Level of anxiety will decrease Outcome: Progressing   Problem: Pain Management: Goal: General experience of comfort will improve Outcome: Progressing   Problem: Safety: Goal: Ability to remain free from injury will improve Outcome: Progressing

## 2023-08-19 ENCOUNTER — Ambulatory Visit: Payer: PPO | Admitting: Internal Medicine

## 2023-08-19 NOTE — Progress Notes (Signed)
   08/19/23 1206  TOC Brief Assessment  Insurance and Status Reviewed  Patient has primary care physician Yes  Home environment has been reviewed Resides with spouse  Prior level of function: Independent at baseline  Prior/Current Home Services No current home services  Social Determinants of Health Reivew SDOH reviewed no interventions necessary  Readmission risk has been reviewed Yes  Transition of care needs no transition of care needs at this time

## 2023-08-19 NOTE — Progress Notes (Signed)
Patient ID: [name], male   DOB: 1946-05-07, 77 y.o.   MRN: [id number] The patient continues to do well.  His Hemovac drain of the left thigh has had minimal output and his incisional VAC has had minimal output.  He will continue IV antibiotics today and mobilizing in the room.  I will come back later this evening to likely remove the Hemovac drain.  My plan is to still discharge him to home tomorrow.

## 2023-08-20 NOTE — Progress Notes (Signed)
Patient ID: [name], male   DOB: 12/26/1945, 77 y.o.   MRN: [id number] The patient is doing well overall.  I did remove the Hemovac drain last evening as well as the incisional VAC.  I placed a compressive dressing around the incision.  I took things down today and his left thigh area looks okay.  The incision is clean and intact and there is no cellulitis.  I placed a new dressing around this.  He can be discharged to home today.

## 2023-08-20 NOTE — Discharge Summary (Signed)
Patient ID: [name] MRN: [id number] DOB/AGE: June 08, 1946 77 y.o.  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  Dislocated posteriorly 2015, ? partial RC tear, has f/u planned with Dr. August Saucer--? surgery? possible plan for 2016.   Blood donor, whole blood    Has donated approx 30 gallons in his lifetime   Chronic left hip pain    Severe DJD.  THA 2008.  Left acetabular revision in 2017 AND 2019.   Chronic pain syndrome    Candidate for spinal cord stimulator (03/2017)   Chronic renal insufficiency, stage II (mild)    Chronic venous insufficiency    +compression hose   DDD (degenerative disc disease), lumbar 2017   Dr. Joette Catching ortho.  He is now wearing a new leg brace.  Has had ESI and will get another.   Diverticulosis    +redundant colon   DVT (deep venous thrombosis) (HCC) 06/14/2016   provoked, bilateral LL's; in post-op setting s/p hip surgery.  Xarelto x 10mo at full dosing, then 6 more months at 1/2 dosing, then stopped xarelto.     Foot drop, left    s/p hip fracture 1969   Hx of adenomatous colonic polyps    polypectomy 2007; 2012; 2015; 05/2019; recall 61yrs   Hypercholesterolemia 02/2017   Recommended statin 02/10/17 but pt declined, wanted to do TLC trial first. Rec'd statin 04/2020   IFG (impaired fasting glucose)    04/2023 fasting 111, a1c 5.7%   Osteoarthritis    knees and hips primarily   Post laminectomy syndrome    improved with spinal cord stim-->stim to be removed summer/fall 2019.   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 PSA 0.16 Feb 2020 urol f/u. [name] Aug 2021 urol. <0.015 02/2021.   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 male who was admitted 08/15/2023 for operative treatment ofTraumatic seroma of thigh, left, subsequent encounter. Patient has severe unremitting pain that affects sleep, daily activities, and work/hobbies. After pre-op clearance the patient was taken to the operating room on 08/15/2023 and underwent  Procedure(s): INCISION AND DRAINAGE OF SEROMA LEFT THIGH.    Patient was given perioperative antibiotics:  Anti-infectives (From admission, onward)    Start     Dose/Rate Route Frequency Ordered Stop   08/15/23 2200  ceFAZolin (ANCEF) IVPB 2g/100 mL premix        2 g 200 mL/hr over 30 Minutes Intravenous Every 8 hours 08/15/23 1737     08/15/23 1145  ceFAZolin (ANCEF) IVPB 2g/100 mL premix        2 g 200 mL/hr over 30 Minutes Intravenous On call to O.R. 08/15/23 1144 08/15/23 1419        Patient was given sequential compression devices, early ambulation, and chemoprophylaxis to prevent DVT.  Patient benefited maximally from  hospital stay and there were no complications.    Recent vital signs: Patient Vitals for the past 24 hrs:  BP Temp Temp src Pulse Resp SpO2  08/20/23 0640 125/68 98 F (36.7 C) Oral -- 17 97 %  08/19/23 2153 116/64 98.3 F (36.8 C) Oral (!) 55 17 97 %  08/19/23 1225 124/62 98 F (36.7 C) Oral (!) 58 18 97 %     Recent laboratory studies: No results for input(s): "WBC", "HGB", "HCT", "PLT", "NA", "K", "CL", "CO2", "BUN", "CREATININE", "GLUCOSE", "INR", "CALCIUM" in the last 72 hours.  Invalid input(s): "PT", "2"   Discharge Medications:   Allergies as  of 08/20/2023       Reactions   Adhesive [tape] Rash   Paper tape is ok        Medication List     TAKE these medications    acetaminophen 500 MG tablet Commonly known as: TYLENOL Take 1,000 mg by mouth 2 (two) times daily as needed for moderate pain or headache.   aspirin EC 81 MG tablet Take 81 mg by mouth daily.   atorvastatin 20 MG tablet Commonly known as: LIPITOR Take 1 tablet (20 mg total) by mouth daily. What changed: when to take this   CALCIUM + D3 PO Take 1 tablet by mouth daily.   cefadroxil 500 MG capsule Commonly known as: DURICEF Take 2 capsules (1,000 mg total) by mouth 2 (two) times daily.   solifenacin 5 MG tablet Commonly known as: VESICARE Take 5 mg by mouth daily.        Diagnostic Studies: DG HIP PORT UNILAT WITH PELVIS 1V LEFT  Result Date: 08/15/2023 CLINICAL DATA:  Preop exam. EXAM: DG HIP (WITH OR WITHOUT PELVIS) 1V PORT LEFT COMPARISON:  10/03/2023 FINDINGS: Again seen is a left total hip arthroplasty device. No signs of periprosthetic fracture or dislocation. No evidence for acute pelvic fracture or diastasis. Prostate gland seed implants identified. IMPRESSION: 1. No acute findings. 2. Left total hip arthroplasty device. Electronically Signed   By: Signa Kell M.D.   On: 08/15/2023 16:09    Disposition: Discharge disposition: 01-Home or Self Care            Signed: Kathryne Hitch 08/20/2023, 7:06 AM

## 2023-08-20 NOTE — Plan of Care (Signed)
  Problem: Education: Goal: Knowledge of General Education information will improve Description: Including pain rating scale, medication(s)/side effects and non-pharmacologic comfort measures Outcome: Completed/Met   Problem: Health Behavior/Discharge Planning: Goal: Ability to manage health-related needs will improve Outcome: Completed/Met   Problem: Clinical Measurements: Goal: Ability to maintain clinical measurements within normal limits will improve Outcome: Adequate for Discharge Goal: Will remain free from infection Outcome: Progressing Goal: Diagnostic test results will improve Outcome: Progressing Goal: Respiratory complications will improve Outcome: Completed/Met Goal: Cardiovascular complication will be avoided Outcome: Completed/Met   Problem: Activity: Goal: Risk for activity intolerance will decrease Outcome: Completed/Met   Problem: Coping: Goal: Level of anxiety will decrease Outcome: Completed/Met   Problem: Elimination: Goal: Will not experience complications related to bowel motility Outcome: Completed/Met Goal: Will not experience complications related to urinary retention Outcome: Completed/Met   Problem: Pain Management: Goal: General experience of comfort will improve Outcome: Adequate for Discharge   Problem: Safety: Goal: Ability to remain free from injury will improve Outcome: Completed/Met   Problem: Skin Integrity: Goal: Risk for impaired skin integrity will decrease Outcome: Completed/Met   Problem: Education: Goal: Knowledge of General Education information will improve Description: Including pain rating scale, medication(s)/side effects and non-pharmacologic comfort measures Outcome: Completed/Met   Problem: Health Behavior/Discharge Planning: Goal: Ability to manage health-related needs will improve Outcome: Progressing   Problem: Clinical Measurements: Goal: Ability to maintain clinical measurements within normal limits will  improve Outcome: Adequate for Discharge Goal: Will remain free from infection Outcome: Progressing Goal: Diagnostic test results will improve Outcome: Adequate for Discharge Goal: Respiratory complications will improve Outcome: Completed/Met Goal: Cardiovascular complication will be avoided Outcome: Completed/Met   Problem: Activity: Goal: Risk for activity intolerance will decrease Outcome: Completed/Met   Problem: Nutrition: Goal: Adequate nutrition will be maintained Outcome: Completed/Met   Problem: Coping: Goal: Level of anxiety will decrease Outcome: Adequate for Discharge   Problem: Elimination: Goal: Will not experience complications related to bowel motility Outcome: Completed/Met Goal: Will not experience complications related to urinary retention Outcome: Completed/Met   Problem: Pain Management: Goal: General experience of comfort will improve Outcome: Completed/Met   Problem: Safety: Goal: Ability to remain free from injury will improve Outcome: Completed/Met   Problem: Skin Integrity: Goal: Risk for impaired skin integrity will decrease Outcome: Completed/Met

## 2023-08-20 NOTE — Discharge Instructions (Signed)
Keep your current dressing in place for the next 2 days. In 2 days you can remove the Ace wrap and get wet in the shower. Leave the Aquacel dressing over your incision.  That dressing can get wet in the shower. Change the Aquacel dressing on your left thigh incision in 5 to 6 days and then leave that dressing on until your follow-up in the office.

## 2023-08-21 ENCOUNTER — Telehealth: Payer: Self-pay

## 2023-08-21 NOTE — Transitions of Care (Post Inpatient/ED Visit) (Signed)
   08/21/2023  Name: [name] MRN: [id number] DOB: [birth year]  Today's TOC FU Call Status: Today's TOC FU Call Status:: Successful TOC FU Call Completed TOC FU Call Complete Date: 08/21/23 Patient's Name and Date of Birth confirmed.  Transition Care Management Follow-up Telephone Call Date of Discharge: 08/20/23 Discharge Facility: [facility]) Type of Discharge: Inpatient Admission Primary Inpatient Discharge Diagnosis:: Recurrent seroma left thigh How have you been since you were released from the hospital?: Better (reports stillhaving pain) Any questions or concerns?: No  Items Reviewed: Did you receive and understand the discharge instructions provided?: Yes Medications obtained,verified, and reconciled?: Yes (Medications Reviewed) Any new allergies since your discharge?: No Dietary orders reviewed?: NA Do you have support at home?: Yes People in Home: spouse, child(ren), adult Name of Support/Comfort Primary Source: wife  and daughter  Medications Reviewed Today: Medications Reviewed Today     Reviewed by Earlie Server, RN (Registered Nurse) on 08/21/23 at [phone]  Med List Status: <None>   Medication Order Taking? Sig Documenting Provider Last Dose Status Informant  acetaminophen (TYLENOL) 500 MG tablet [id number] Yes Take 1,000 mg by mouth 2 (two) times daily as needed for moderate pain or headache.  [provider] Taking Active Self  aspirin EC 81 MG tablet [id number] Yes Take 81 mg by mouth daily. [provider] Taking Active Self  atorvastatin (LIPITOR) 20 MG tablet [id number] Yes Take 1 tablet (20 mg total) by mouth daily.  Patient taking differently: Take 20 mg by mouth at bedtime.   Jeoffrey Massed, MD Taking Active Self  Calcium Carb-Cholecalciferol (CALCIUM + D3 PO) [id number] Yes Take 1 tablet by mouth daily. [provider] Taking Active Self  solifenacin (VESICARE) 5 MG tablet [id number] Yes Take 5 mg by mouth daily. [provider] Taking Active Self            Home Care and Equipment/Supplies: Were Home Health Services Ordered?: NA Any new equipment or medical supplies ordered?: NA  Functional Questionnaire: Do you need assistance with bathing/showering or dressing?: No Do you need assistance with meal preparation?: No Do you need assistance with eating?: No Do you have difficulty maintaining continence: No Do you need assistance with getting out of bed/getting out of a chair/moving?: No Do you have difficulty managing or taking your medications?: No  Follow up appointments reviewed: PCP Follow-up appointment confirmed?: NA Specialist Hospital Follow-up appointment confirmed?: Yes Date of Specialist follow-up appointment?: 08/28/23 Follow-Up Specialty Provider:: Dr. Rayburn Ma Do you need transportation to your follow-up appointment?: No Do you understand care options if your condition(s) worsen?: Yes-patient verbalized understanding  SDOH Interventions Today    Flowsheet Row Most Recent Value  SDOH Interventions   Food Insecurity Interventions Intervention Not Indicated  Housing Interventions Intervention Not Indicated  Transportation Interventions Intervention Not Indicated  Utilities Interventions Intervention Not Indicated      Patient reports that he is doing well. Reports taking tylenol this am for pain 3/10.  Reports no questions or concerns today.  Reviewed all discharge instructions. Encouraged patient to message his PCP to see if the PCP wanted patient to be seen.  Follow up scheduled with specialist.  Offered 30 day TOC program and patient declined. Provided my contact information for patient to call me if needed.   Lonia Chimera, RN, BSN, CEN [employer]- Transition of Care Team.  [facility] [phone]

## 2023-08-22 ENCOUNTER — Other Ambulatory Visit: Payer: PPO

## 2023-08-28 ENCOUNTER — Ambulatory Visit: Payer: PPO | Admitting: Orthopaedic Surgery

## 2023-08-28 ENCOUNTER — Encounter: Payer: Self-pay | Admitting: Orthopaedic Surgery

## 2023-08-28 DIAGNOSIS — Z9889 Other specified postprocedural states: Secondary | ICD-10-CM

## 2023-08-28 NOTE — Progress Notes (Signed)
The patient is here for first postoperative visit status post incision and drainage/irrigation debridement of a chronic left posterior thigh seroma.  He has a history of a chronic infection as a relates to the retained left total hip arthroplasty.  He had been seen in the office numerous weeks in a row for multiple aspirations of a large seroma.  Each time we saw him in the office who aspirated almost 500 cc of fluid from the posterior thigh.  We finally took him to the operating room 2 weeks ago tomorrow and opened up the posterior thigh.  We removed abundant amount of seroma from the thigh and some necrotic tissue.  We then closed over a drain and an incisional VAC.  We kept him in the hospital for several days and then removed the incisional VAC and the drain and discharged him to home.  He is here today for routine follow-up to see how his left thigh that is doing.  The suture line is intact at his left posterior lateral thigh.  However there is a large reaccumulation of fluid from his left posterior thigh area.  I was able to aspirate probably around 500 cc or more of fluid from the back of his thigh.  The sutures were then removed and Steri-Strips applied and then placed a compressive dressing.  He can remove this in 24 hours and he does have an Aquacel dressing at home that he can put over his incision.  We will see him back in 2 weeks from now to see how he is doing overall.  I suspect there will be continued reaccumulation's of seroma fluid unfortunately.

## 2023-09-10 ENCOUNTER — Encounter: Payer: Self-pay | Admitting: Orthopaedic Surgery

## 2023-09-10 ENCOUNTER — Ambulatory Visit: Payer: PPO | Admitting: Orthopaedic Surgery

## 2023-09-10 DIAGNOSIS — T792XXD Traumatic secondary and recurrent hemorrhage and seroma, subsequent encounter: Secondary | ICD-10-CM

## 2023-09-10 DIAGNOSIS — T8452XS Infection and inflammatory reaction due to internal left hip prosthesis, sequela: Secondary | ICD-10-CM

## 2023-09-10 DIAGNOSIS — Z9889 Other specified postprocedural states: Secondary | ICD-10-CM

## 2023-09-10 NOTE — Progress Notes (Signed)
The patient comes in for continued follow-up of a recurrent left posterior thigh seroma.  He is over 4 weeks out from an incision and drainage of that left thigh.  Unfortunately he has had a recurrent seroma.  On exam he does have a large seroma again at the posterior thigh.  The skin is indurated and red.  I was able to express at least 400 cc of fluid from the back of his thigh.  He does need a repeat surgery on his left thigh.  This time we will need to consider keeping the wound open with a VAC over the wound that we would then send him home with an order to see if this could eventually keep the seroma from reoccurring.  We will work on getting this set up for Friday of next week on December 13.  Again we would need to do this in a prone position and have him stay for a few days while he can be set up for a home VAC.  He understands and agrees with this treatment plan.  We will be in touch with scheduling surgery.

## 2023-09-10 NOTE — Patient Instructions (Addendum)
SURGICAL WAITING ROOM VISITATION  Patients having surgery or a procedure may have no more than 2 support people in the waiting area - these visitors may rotate.    Children under the age of 10 must have an adult with them who is not the patient.   If the patient needs to stay at the hospital during part of their recovery, the visitor guidelines for inpatient rooms apply. Pre-op nurse will coordinate an appropriate time for 1 support person to accompany patient in pre-op.  This support person may not rotate.    Please refer to the [facility] website for the visitor guidelines for Inpatients (after your surgery is over and you are in a regular room).       Your procedure is scheduled on: 09-19-23   Report to [facility] Main Entrance    Report to admitting at  12:45 PM   Call this number if you have problems the morning of surgery [phone]   Do not eat food :After Midnight.   After Midnight you may have the following liquids until ___12:00___ PM DAY OF SURGERY  then nothing by mouth  Water Non-Citrus Juices (without pulp, NO RED-Apple, White grape, White cranberry) Black Coffee (NO MILK/CREAM OR CREAMERS, sugar ok)  Clear Tea (NO MILK/CREAM OR CREAMERS, sugar ok) regular and decaf                             Plain Jell-O (NO RED)                                           Fruit ices (not with fruit pulp, NO RED)                                     Popsicles (NO RED)                                                               Sports drinks like Gatorade (NO RED)                  The day of surgery:  COMPLETE ONE (1) Pre-Surgery Clear Ensure  by 12:00  PM the morning of surgery. Drink in one sitting. Do not sip.  This drink was given to you during your hospital  pre-op appointment visit. Nothing else to drink after completing the  Pre-Surgery Clear Ensure by 12:00 pm.          If you have questions, please contact your surgeon's office.   FOLLOW  ANY  ADDITIONAL PRE OP INSTRUCTIONS YOU RECEIVED FROM YOUR SURGEON'S OFFICE!!!     Oral Hygiene is also important to reduce your risk of infection.                                    Remember - BRUSH YOUR TEETH THE MORNING OF SURGERY WITH YOUR REGULAR TOOTHPASTE  DENTURES WILL BE REMOVED PRIOR TO SURGERY PLEASE DO NOT APPLY "Poly grip" OR  ADHESIVES!!!   Do NOT smoke after Midnight   Stop all vitamins and herbal supplements 7 days before surgery.   Take these medicines the morning of surgery with A SIP OF WATER: tylenol if needed, Solifenacin              You may not have any metal on your body including hair pins, jewelry, and body piercing             Do not wear  lotions, powders, cologne, or deodorant               Men may shave face and neck.   Do not bring valuables to the hospital. [facility] IS NOT             RESPONSIBLE   FOR VALUABLES.   Contacts, glasses, dentures or bridgework may not be worn into surgery.   Bring small overnight bag day of surgery.   DO NOT BRING YOUR HOME MEDICATIONS TO THE HOSPITAL. PHARMACY WILL DISPENSE MEDICATIONS LISTED ON YOUR MEDICATION LIST TO YOU DURING YOUR ADMISSION IN THE HOSPITAL!     Special Instructions: Bring a copy of your healthcare power of attorney and living will documents the day of surgery if you haven't scanned them before.              Please read over the following fact sheets you were given:              IF YOU HAVE QUESTIONS ABOUT YOUR PRE-OP INSTRUCTIONS PLEASE CALL [phone]   If you test positive for Covid or have been in contact with anyone that has tested positive in the last 10 days please notify you surgeon.      [facility] - Preparing for Surgery Before surgery, you can play an important role.  Because skin is not sterile, your skin needs to be as free of germs as possible.  You can reduce the number of germs on your skin by washing with CHG (chlorahexidine gluconate) soap before surgery.  CHG is an  antiseptic cleaner which kills germs and bonds with the skin to continue killing germs even after washing. Please DO NOT use if you have an allergy to CHG or antibacterial soaps.  If your skin becomes reddened/irritated stop using the CHG and inform your nurse when you arrive at Short Stay.   You may shave your face/neck.  Please follow these instructions carefully:  1.  Shower with CHG Soap the night before surgery and the  morning of Surgery.  2.  If you choose to wash your hair, wash your hair first as usual with your  normal  shampoo.  3.  After you shampoo, rinse your hair and body thoroughly to remove the  shampoo.                            4.  Use CHG as you would any other liquid soap.  You can apply chg directly  to the skin and wash                       Gently with a scrungie or clean washcloth.  5.  Apply the CHG Soap to your body ONLY FROM THE NECK DOWN.   Do not use on face/ open  Wound or open sores. Avoid contact with eyes, ears mouth and genitals (private parts).                       Wash face,  Genitals (private parts) with your normal soap.             6.  Wash thoroughly, paying special attention to the area where your surgery  will be performed.  7.  Thoroughly rinse your body with warm water from the neck down.  8.  DO NOT shower/wash with your normal soap after using and rinsing off  the CHG Soap.             9.  Pat yourself dry with a clean towel.            10.  Wear clean pajamas.            11.  Place clean sheets on your bed the night of your first shower and do not  sleep with pets. Day of Surgery : Do not apply any lotions/deodorants the morning of surgery.  Please wear clean clothes to the hospital/surgery center.  FAILURE TO FOLLOW THESE INSTRUCTIONS MAY RESULT IN THE CANCELLATION OF YOUR SURGERY  PATIENT  SIGNATURE_________________________________    ________________________________________________________________________  Rogelia Mire  An incentive spirometer is a tool that can help keep your lungs clear and active. This tool measures how well you are filling your lungs with each breath. Taking long deep breaths may help reverse or decrease the chance of developing breathing (pulmonary) problems (especially infection) following: A long period of time when you are unable to move or be active. BEFORE THE PROCEDURE  If the spirometer includes an indicator to show your best effort, your nurse or respiratory therapist will set it to a desired goal. If possible, sit up straight or lean slightly forward. Try not to slouch. Hold the incentive spirometer in an upright position. INSTRUCTIONS FOR USE  Sit on the edge of your bed if possible, or sit up as far as you can in bed or on a chair. Hold the incentive spirometer in an upright position. Breathe out normally. Place the mouthpiece in your mouth and seal your lips tightly around it. Breathe in slowly and as deeply as possible, raising the piston or the ball toward the top of the column. Hold your breath for 3-5 seconds or for as long as possible. Allow the piston or ball to fall to the bottom of the column. Remove the mouthpiece from your mouth and breathe out normally. Rest for a few seconds and repeat Steps 1 through 7 at least 10 times every 1-2 hours when you are awake. Take your time and take a few normal breaths between deep breaths. The spirometer may include an indicator to show your best effort. Use the indicator as a goal to work toward during each repetition. After each set of 10 deep breaths, practice coughing to be sure your lungs are clear. If you have an incision (the cut made at the time of surgery), support your incision when coughing by placing a pillow or rolled up towels firmly against it. Once you are able to get out of  bed, walk around indoors and cough well. You may stop using the incentive spirometer when instructed by your caregiver.  RISKS AND COMPLICATIONS Take your time so you do not get dizzy or light-headed. If you are in pain, you may need to take or ask for pain medication  before doing incentive spirometry. It is harder to take a deep breath if you are having pain. AFTER USE Rest and breathe slowly and easily. It can be helpful to keep track of a log of your progress. Your caregiver can provide you with a simple table to help with this. If you are using the spirometer at home, follow these instructions: SEEK MEDICAL CARE IF:  You are having difficultly using the spirometer. You have trouble using the spirometer as often as instructed. Your pain medication is not giving enough relief while using the spirometer. You develop fever of 100.5 F (38.1 C) or higher. SEEK IMMEDIATE MEDICAL CARE IF:  You cough up bloody sputum that had not been present before. You develop fever of 102 F (38.9 C) or greater. You develop worsening pain at or near the incision site. MAKE SURE YOU:  Understand these instructions. Will watch your condition. Will get help right away if you are not doing well or get worse. Document Released: 02/03/2007 Document Revised: 12/16/2011 Document Reviewed: 04/06/2007 [facility] Patient Information 2014 [place], [place].   ________________________________________________________________________

## 2023-09-17 ENCOUNTER — Other Ambulatory Visit: Payer: Self-pay

## 2023-09-17 ENCOUNTER — Encounter (HOSPITAL_COMMUNITY)
Admission: RE | Admit: 2023-09-17 | Discharge: 2023-09-17 | Disposition: A | Discharge status: Home / Self Care | Payer: PPO | Source: Ambulatory Visit | Attending: Orthopaedic Surgery | Admitting: Orthopaedic Surgery

## 2023-09-17 ENCOUNTER — Encounter (HOSPITAL_COMMUNITY): Payer: Self-pay

## 2023-09-17 DIAGNOSIS — Z01818 Encounter for other preprocedural examination: Secondary | ICD-10-CM | POA: Insufficient documentation

## 2023-09-17 LAB — BASIC METABOLIC PANEL
Anion gap: 7 (ref 5–15)
BUN: 17 mg/dL (ref 8–23)
CO2: 23 mmol/L (ref 22–32)
Calcium: 9.6 mg/dL (ref 8.9–10.3)
Chloride: 107 mmol/L (ref 98–111)
Creatinine, Ser: 0.95 mg/dL (ref 0.61–1.24)
GFR, Estimated: >60 mL/min (ref >60)
Glucose, Bld: 108 mg/dL — ABNORMAL HIGH (ref 70–99)
Potassium: 4.3 mmol/L (ref 3.5–5.1)
Sodium: 137 mmol/L (ref 135–145)

## 2023-09-17 LAB — CBC
HCT: 37.9 % — ABNORMAL LOW (ref 39.0–52.0)
Hemoglobin: 11.6 g/dL — ABNORMAL LOW (ref 13.0–17.0)
MCH: 28.1 pg (ref 26.0–34.0)
MCHC: 30.6 g/dL (ref 30.0–36.0)
MCV: 91.8 fL (ref 80.0–100.0)
Platelets: 350 10*3/uL (ref 150–400)
RBC: 4.13 MIL/uL — ABNORMAL LOW (ref 4.22–5.81)
RDW: 14.4 % (ref 11.5–15.5)
WBC: 6.9 10*3/uL (ref 4.0–10.5)
nRBC: 0 % (ref 0.0–0.2)

## 2023-09-17 NOTE — Progress Notes (Signed)
Anesthesia note:     PCP - Dr. Nicoletta Ba. LOV 05/05/23 Cardiologist -none Other-   Chest x-ray - no EKG - 01/02/23 Stress Test -no  ECHO - no Cardiac Cath - no CABG-no Pacemaker/ICD device last checked:no  Sleep Study - no CPAP -   Pt is pre diabetic-no CBG at PAT visit- Fasting Blood Sugar at home- Checks Blood Sugar _____  Blood Thinner:ASA stopped12/4/24 Blood Thinner Instructions: Aspirin Instructions: Last Dose:  Anesthesia review:/No  reason:  Patient denies shortness of breath, fever, cough and chest pain at PAT appointmentyes   Patient verbalized understanding of instructions that were given to them at the PAT appointment. Patient was also instructed that they will need to review over the PAT instructions again at home before surgery.

## 2023-09-18 NOTE — H&P (Signed)
[name] is an 77 y.o. male.   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  [name] has a history of a failed left total hip arthroplasty that was revised several times.  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  [name] is under chronic suppression with antibiotics in terms of the infection.  Over the years [name] has had multiple I&D's of the right hip.  [name] is reluctant to have all the hardware removed given the debilitating aspect this will have on his life and his mobility.  [name] takes care of his wife who does have some mild dementia.  [name] understands that the only way to truly eradicate infection will be to remove all components and these would likely need to stay out permanently.  Several months ago [name] developed swelling of the back of his thigh after mowing the lawn.  This is developed into a recurrent seroma that we have drained multiple times over several weeks in a row taking off at least 450 to 500 cc of fluid off of the posterior thigh on numerous occasions.  Over a month ago we did did take him to the operating room for an open I&D of the posterior thigh area and we placed a drain in the wound as well as close the wound with an incisional VAC.  [name] was admitted to the hospital for several days of IV antibiotics and the drain was removed after minimal output by hospital day 5.  [name] was discharged to home.  Since then [name] has had recurrence of seroma and we have attempted to drain this on multiple occasions again in the office.  [name] continues to develop some induration and redness of the skin at this point we have recommended a repeat open incision and drainage with an I&D of his left posterior thigh.  In this setting we will likely leave the wound open with a VAC placed in the wound.  [name] will then likely need to be discharged to home after several days with VAC changes as an  outpatient every 3 to 5 days.  [name] understands this fully.  Past Medical History:  Diagnosis Date   Anterior dislocation of right shoulder 09/2013   Reduced in ED under sedation.  Dislocated posteriorly 2015, ? partial RC tear, has f/u planned with Dr. August Saucer--? surgery? possible plan for 2016.   Blood donor, whole blood    Has donated approx 30 gallons in his lifetime   Chronic left hip pain    Severe DJD.  THA 2008.  Left acetabular revision in 2017 AND 2019.   Chronic pain syndrome    Candidate for spinal cord stimulator (03/2017)   Chronic renal insufficiency, stage II (mild)    Chronic venous insufficiency    +compression hose   DDD (degenerative disc disease), lumbar 2017   Dr. Joette Catching ortho.  [name] is now wearing a new leg brace.  Has had ESI and will get another.   Diverticulosis    +redundant colon   DVT (deep venous thrombosis) (HCC) 06/14/2016   provoked, bilateral LL's; in post-op setting s/p hip surgery.  Xarelto x 39mo at full dosing, then 6 more months at 1/2 dosing, then stopped xarelto.     Foot drop, left    s/p hip fracture 1969   Hx of adenomatous colonic polyps    polypectomy 2007; 2012; 2015; 05/2019; recall 69yrs  Hypercholesterolemia 02/2017   Recommended statin 02/10/17 but pt declined, wanted to do TLC trial first. Rec'd statin 04/2020   IFG (impaired fasting glucose)    04/2023 fasting 111, a1c 5.7%   Osteoarthritis    knees and hips primarily   Post laminectomy syndrome    improved with spinal cord stim-->stim to be removed summer/fall 2019.   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 PSA 0.16 Feb 2020 urol f/u. [name] Aug 2021 urol. <0.015 02/2021.   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; 02/2020   MSSA; admitted for I&D 02/11/20->plan for longterm IV abx after   Urge incontinence    vesicare helpful    Past Surgical History:   Procedure Laterality Date   ANTERIOR HIP REVISION Left 06/07/2016   Procedure: LEFT ANTERIOR HIP ACETABULAR REVISION;  Surgeon: Kathryne Hitch, MD;  Location: [facility];  Service: Orthopedics;  Laterality: Left;   ANTERIOR HIP REVISION Left 10/09/2017   Procedure: Left hip acetabular revision;  Surgeon: Kathryne Hitch, MD;  Location: [facility];  Service: Orthopedics;  Laterality: Left;   bilateral carpal tunnel release  2003   COLONOSCOPY  2007;2012;2015;05/2019   04/12/2014 Tubular adenoma x 1; 05/2019 adenomatous polyp; recall 5 yrs.   FRACTURE SURGERY Left 1969   hip   INCISION AND DRAINAGE Left 08/15/2023   Procedure: INCISION AND DRAINAGE OF SEROMA LEFT THIGH;  Surgeon: Kathryne Hitch, MD;  Location: [facility];  Service: Orthopedics;  Laterality: Left;   INCISION AND DRAINAGE HIP Left 02/11/2020   Procedure: IRRIGATION AND DEBRIDEMENT LEFT HIP WITH ANTIBIOTIC SPACER PLACEMENT;  Surgeon: Kathryne Hitch, MD;  Location: [facility];  Service: Orthopedics;  Laterality: Left;   INCISION AND DRAINAGE HIP Left 01/03/2023   Procedure: IRRIGATION AND DEBRIDEMENT LEFT HIP;  Surgeon: Kathryne Hitch, MD;  Location: [facility];  Service: Orthopedics;  Laterality: Left;   JOINT REPLACEMENT Left    hip   LUMBAR LAMINECTOMY/DECOMPRESSION MICRODISCECTOMY Left 03/28/2016   Procedure: Left Lumbar three-four Laminoforaminotomy;  Surgeon: Maeola Harman, MD;  Location: [facility];  Service: Neurosurgery;  Laterality: Left;  left   POLYPECTOMY     RADIOACTIVE SEED IMPLANT  2012   ROTATOR CUFF REPAIR  2006   right   SHOULDER ARTHROSCOPY Left    SPINAL CORD STIMULATOR IMPLANT  06/2017   removed a year after was putted in.   TOTAL HIP ARTHROPLASTY  2008   left (cobalt chrome)   VASECTOMY      Family History  Problem Relation Age of Onset   Lung cancer Mother    Heart disease Father    Colon cancer Neg Hx    Esophageal cancer Neg Hx    Rectal cancer Neg Hx    Stomach cancer  Neg Hx    Colon polyps Neg Hx    Social History:  reports that [name] has never smoked. [name] has never used smokeless tobacco. [name] reports that [name] does not drink alcohol and does not use drugs.  Allergies:  Allergies  Allergen Reactions   Adhesive [Tape] Rash    Paper tape is ok    No medications prior to admission.    Results for orders placed or performed during the hospital encounter of 09/17/23 (from the past 48 hours)  CBC     Status: Abnormal   Collection Time: 09/17/23 10:30 AM  Result Value Ref Range   WBC 6.9  4.0 - 10.5 K/uL   RBC 4.13 (L) 4.22 - 5.81 MIL/uL   Hemoglobin 11.6 (L) 13.0 - 17.0 g/dL   HCT 44.0 (L) 10.2 - 72.5 %   MCV 91.8 80.0 - 100.0 fL   MCH 28.1 26.0 - 34.0 pg   MCHC 30.6 30.0 - 36.0 g/dL   RDW 36.6 44.0 - 34.7 %   Platelets 350 150 - 400 K/uL   nRBC 0.0 0.0 - 0.2 %    Comment: Performed at [facility], [address] [place], [place] [address]  Basic metabolic panel     Status: Abnormal   Collection Time: 09/17/23 10:30 AM  Result Value Ref Range   Sodium 137 135 - 145 mmol/L   Potassium 4.3 3.5 - 5.1 mmol/L   Chloride 107 98 - 111 mmol/L   CO2 23 22 - 32 mmol/L   Glucose, Bld 108 (H) 70 - 99 mg/dL    Comment: Glucose reference range applies only to samples taken after fasting for at least 8 hours.   BUN 17 8 - 23 mg/dL   Creatinine, Ser 6.38 0.61 - 1.24 mg/dL   Calcium 9.6 8.9 - 75.6 mg/dL   GFR, Estimated >43 >32 mL/min    Comment: (NOTE) Calculated using the CKD-EPI Creatinine Equation (2021)    Anion gap 7 5 - 15    Comment: Performed at [facility], [address] [place], [place] [address]   No results found.  Review of Systems  There were no vitals taken for this visit. Physical Exam Vitals reviewed.  Constitutional:      Appearance: Normal appearance.  HENT:     Head: Normocephalic and atraumatic.  Eyes:     Extraocular Movements: Extraocular movements intact.     Pupils: Pupils are  equal, round, and reactive to light.  Cardiovascular:     Rate and Rhythm: Normal rate and regular rhythm.  Pulmonary:     Effort: Pulmonary effort is normal.     Breath sounds: Normal breath sounds.  Abdominal:     Palpations: Abdomen is soft.  Musculoskeletal:     Cervical back: Normal range of motion and neck supple.     Left upper leg: Swelling and edema present.  Neurological:     Mental Status: [name] is alert and oriented to person, place, and time.  Psychiatric:        Behavior: Behavior normal.      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  The patient understands that this may not work given his continued chronic infection but this is certainly worth trying at this standpoint and we did describe the risk and benefits of surgery in significant detail.  Kathryne Hitch, MD 09/18/2023, 5:54 PM

## 2023-09-19 ENCOUNTER — Inpatient Hospital Stay (HOSPITAL_COMMUNITY)
Admission: RE | Admit: 2023-09-19 | Discharge: 2023-09-23 | DRG: 920 | Disposition: A | Discharge status: Home Health | Payer: PPO | Attending: Orthopaedic Surgery | Admitting: Orthopaedic Surgery

## 2023-09-19 ENCOUNTER — Encounter (HOSPITAL_COMMUNITY)
Admission: RE | Disposition: A | Discharge status: Home / Self Care | Payer: Self-pay | Source: Home / Self Care | Attending: Orthopaedic Surgery

## 2023-09-19 ENCOUNTER — Inpatient Hospital Stay (HOSPITAL_COMMUNITY): Payer: PPO | Admitting: Anesthesiology

## 2023-09-19 ENCOUNTER — Other Ambulatory Visit: Payer: Self-pay

## 2023-09-19 ENCOUNTER — Encounter (HOSPITAL_COMMUNITY): Payer: Self-pay | Admitting: Orthopaedic Surgery

## 2023-09-19 DIAGNOSIS — Z6834 Body mass index (BMI) 34.0-34.9, adult: Secondary | ICD-10-CM

## 2023-09-19 DIAGNOSIS — Z8546 Personal history of malignant neoplasm of prostate: Secondary | ICD-10-CM | POA: Diagnosis not present

## 2023-09-19 DIAGNOSIS — E66811 Obesity, class 1: Secondary | ICD-10-CM | POA: Diagnosis not present

## 2023-09-19 DIAGNOSIS — T8189XA Other complications of procedures, not elsewhere classified, initial encounter: Secondary | ICD-10-CM | POA: Diagnosis not present

## 2023-09-19 DIAGNOSIS — Z801 Family history of malignant neoplasm of trachea, bronchus and lung: Secondary | ICD-10-CM

## 2023-09-19 DIAGNOSIS — T792XXD Traumatic secondary and recurrent hemorrhage and seroma, subsequent encounter: Secondary | ICD-10-CM | POA: Diagnosis not present

## 2023-09-19 DIAGNOSIS — Z792 Long term (current) use of antibiotics: Secondary | ICD-10-CM | POA: Diagnosis not present

## 2023-09-19 DIAGNOSIS — M96842 Postprocedural seroma of a musculoskeletal structure following a musculoskeletal system procedure: Secondary | ICD-10-CM | POA: Diagnosis not present

## 2023-09-19 DIAGNOSIS — Z8781 Personal history of (healed) traumatic fracture: Secondary | ICD-10-CM

## 2023-09-19 DIAGNOSIS — Y792 Prosthetic and other implants, materials and accessory orthopedic devices associated with adverse incidents: Secondary | ICD-10-CM | POA: Diagnosis present

## 2023-09-19 DIAGNOSIS — I739 Peripheral vascular disease, unspecified: Secondary | ICD-10-CM | POA: Diagnosis present

## 2023-09-19 DIAGNOSIS — Z8249 Family history of ischemic heart disease and other diseases of the circulatory system: Secondary | ICD-10-CM | POA: Diagnosis not present

## 2023-09-19 DIAGNOSIS — T8452XA Infection and inflammatory reaction due to internal left hip prosthesis, initial encounter: Secondary | ICD-10-CM | POA: Diagnosis present

## 2023-09-19 DIAGNOSIS — E669 Obesity, unspecified: Secondary | ICD-10-CM | POA: Diagnosis not present

## 2023-09-19 DIAGNOSIS — Z9682 Presence of neurostimulator: Secondary | ICD-10-CM

## 2023-09-19 DIAGNOSIS — S71102A Unspecified open wound, left thigh, initial encounter: Secondary | ICD-10-CM | POA: Diagnosis not present

## 2023-09-19 DIAGNOSIS — Z79899 Other long term (current) drug therapy: Secondary | ICD-10-CM | POA: Diagnosis not present

## 2023-09-19 DIAGNOSIS — L7634 Postprocedural seroma of skin and subcutaneous tissue following other procedure: Secondary | ICD-10-CM | POA: Diagnosis not present

## 2023-09-19 DIAGNOSIS — G894 Chronic pain syndrome: Secondary | ICD-10-CM | POA: Diagnosis not present

## 2023-09-19 DIAGNOSIS — E7800 Pure hypercholesterolemia, unspecified: Secondary | ICD-10-CM | POA: Diagnosis not present

## 2023-09-19 DIAGNOSIS — Z860101 Personal history of adenomatous and serrated colon polyps: Secondary | ICD-10-CM

## 2023-09-19 DIAGNOSIS — Z7982 Long term (current) use of aspirin: Secondary | ICD-10-CM

## 2023-09-19 DIAGNOSIS — Y831 Surgical operation with implant of artificial internal device as the cause of abnormal reaction of the patient, or of later complication, without mention of misadventure at the time of the procedure: Secondary | ICD-10-CM | POA: Diagnosis present

## 2023-09-19 DIAGNOSIS — Z86718 Personal history of other venous thrombosis and embolism: Secondary | ICD-10-CM | POA: Diagnosis not present

## 2023-09-19 DIAGNOSIS — N182 Chronic kidney disease, stage 2 (mild): Secondary | ICD-10-CM | POA: Diagnosis not present

## 2023-09-19 HISTORY — PX: INCISION AND DRAINAGE: SHX5863

## 2023-09-19 SURGERY — INCISION AND DRAINAGE
Anesthesia: General | Laterality: Left

## 2023-09-19 MED ORDER — ORAL CARE MOUTH RINSE: 15.0000 mL | Freq: Once | OROMUCOSAL | Status: DC

## 2023-09-19 MED ORDER — LACTATED RINGERS IV SOLN: INTRAVENOUS | Status: DC | PRN

## 2023-09-19 MED ORDER — OXYCODONE HCL 5 MG/5ML PO SOLN: 5.0000 mg | Freq: Once | ORAL | Status: AC | PRN

## 2023-09-19 MED ORDER — ROCURONIUM BROMIDE 10 MG/ML (PF) SYRINGE
PREFILLED_SYRINGE | INTRAVENOUS | Status: AC
  Filled 2023-09-19: qty 10

## 2023-09-19 MED ORDER — DOCUSATE SODIUM 100 MG PO CAPS
100.0000 mg | ORAL_CAPSULE | Freq: Two times a day (BID) | ORAL | Status: DC
  Administered 2023-09-19 – 2023-09-22 (×6): 100 mg via ORAL
  Filled 2023-09-19 (×7): qty 1

## 2023-09-19 MED ORDER — ACETAMINOPHEN 325 MG PO TABS
325.0000 mg | ORAL_TABLET | Freq: Four times a day (QID) | ORAL | Status: DC | PRN
Start: 2023-09-20 — End: 2023-09-23

## 2023-09-19 MED ORDER — LIDOCAINE HCL (PF) 2 % IJ SOLN
INTRAMUSCULAR | Status: AC
  Filled 2023-09-19: qty 5

## 2023-09-19 MED ORDER — HYDROMORPHONE HCL 1 MG/ML IJ SOLN
INTRAMUSCULAR | Status: AC
  Administered 2023-09-19: 0.5 mg via INTRAVENOUS
  Filled 2023-09-19: qty 1

## 2023-09-19 MED ORDER — FENTANYL CITRATE (PF) 100 MCG/2ML IJ SOLN
INTRAMUSCULAR | Status: DC | PRN
  Administered 2023-09-19: 100 ug via INTRAVENOUS

## 2023-09-19 MED ORDER — SUGAMMADEX SODIUM 200 MG/2ML IV SOLN
INTRAVENOUS | Status: DC | PRN
  Administered 2023-09-19: 50 mg via INTRAVENOUS
  Administered 2023-09-19: 200 mg via INTRAVENOUS

## 2023-09-19 MED ORDER — HYDROCODONE-ACETAMINOPHEN 5-325 MG PO TABS
1.0000 | ORAL_TABLET | ORAL | Status: DC | PRN
Start: 2023-09-19 — End: 2023-09-23
  Administered 2023-09-19 – 2023-09-22 (×6): 1 via ORAL
  Filled 2023-09-19 (×7): qty 1

## 2023-09-19 MED ORDER — VANCOMYCIN HCL IN DEXTROSE 1-5 GM/200ML-% IV SOLN: 1000.0000 mg | Freq: Two times a day (BID) | INTRAVENOUS | Status: DC

## 2023-09-19 MED ORDER — ROCURONIUM BROMIDE 100 MG/10ML IV SOLN
INTRAVENOUS | Status: DC | PRN
  Administered 2023-09-19: 70 mg via INTRAVENOUS

## 2023-09-19 MED ORDER — HYDROMORPHONE HCL 1 MG/ML IJ SOLN
INTRAMUSCULAR | Status: AC
  Filled 2023-09-19: qty 1

## 2023-09-19 MED ORDER — BUPIVACAINE HCL (PF) 0.5 % IJ SOLN: INTRAMUSCULAR | Status: DC | PRN

## 2023-09-19 MED ORDER — DROPERIDOL 2.5 MG/ML IJ SOLN: 0.6250 mg | Freq: Once | INTRAMUSCULAR | Status: DC | PRN

## 2023-09-19 MED ORDER — ASPIRIN 81 MG PO TBEC
81.0000 mg | DELAYED_RELEASE_TABLET | Freq: Every day | ORAL | Status: DC
  Administered 2023-09-20 – 2023-09-23 (×4): 81 mg via ORAL
  Filled 2023-09-19 (×4): qty 1

## 2023-09-19 MED ORDER — METHOCARBAMOL 500 MG PO TABS
500.0000 mg | ORAL_TABLET | Freq: Four times a day (QID) | ORAL | Status: DC | PRN
  Administered 2023-09-21 – 2023-09-22 (×2): 500 mg via ORAL
  Filled 2023-09-19 (×2): qty 1

## 2023-09-19 MED ORDER — ONDANSETRON HCL 4 MG/2ML IJ SOLN: 4.0000 mg | Freq: Four times a day (QID) | INTRAMUSCULAR | Status: DC | PRN

## 2023-09-19 MED ORDER — MIDAZOLAM HCL 2 MG/2ML IJ SOLN
INTRAMUSCULAR | Status: AC
Start: 2023-09-19 — End: ?
  Filled 2023-09-19: qty 2

## 2023-09-19 MED ORDER — CHLORHEXIDINE GLUCONATE 0.12 % MT SOLN: 15.0000 mL | Freq: Once | OROMUCOSAL | Status: DC

## 2023-09-19 MED ORDER — METOCLOPRAMIDE HCL 5 MG PO TABS: 5.0000 mg | ORAL_TABLET | Freq: Three times a day (TID) | ORAL | Status: DC | PRN

## 2023-09-19 MED ORDER — TRANEXAMIC ACID-NACL 1000-0.7 MG/100ML-% IV SOLN
1000.0000 mg | INTRAVENOUS | Status: DC
  Filled 2023-09-19: qty 100

## 2023-09-19 MED ORDER — PANTOPRAZOLE SODIUM 40 MG PO TBEC
40.0000 mg | DELAYED_RELEASE_TABLET | Freq: Every day | ORAL | Status: DC
  Administered 2023-09-19 – 2023-09-23 (×5): 40 mg via ORAL
  Filled 2023-09-19 (×5): qty 1

## 2023-09-19 MED ORDER — ATORVASTATIN CALCIUM 20 MG PO TABS
20.0000 mg | ORAL_TABLET | Freq: Every day | ORAL | Status: DC
  Administered 2023-09-19 – 2023-09-22 (×4): 20 mg via ORAL
  Filled 2023-09-19 (×4): qty 1

## 2023-09-19 MED ORDER — DEXAMETHASONE SODIUM PHOSPHATE 4 MG/ML IJ SOLN
INTRAMUSCULAR | Status: DC | PRN
  Administered 2023-09-19: 8 mg via INTRAVENOUS

## 2023-09-19 MED ORDER — CEFAZOLIN SODIUM-DEXTROSE 2-4 GM/100ML-% IV SOLN
2.0000 g | INTRAVENOUS | Status: AC
Start: 2023-09-19 — End: 2023-09-19
  Administered 2023-09-19: 2 g via INTRAVENOUS
  Filled 2023-09-19: qty 100

## 2023-09-19 MED ORDER — MORPHINE SULFATE (PF) 2 MG/ML IV SOLN: 0.5000 mg | INTRAVENOUS | Status: DC | PRN

## 2023-09-19 MED ORDER — METHOCARBAMOL 1000 MG/10ML IJ SOLN
500.0000 mg | Freq: Four times a day (QID) | INTRAMUSCULAR | Status: DC | PRN
Start: 2023-09-19 — End: 2023-09-23

## 2023-09-19 MED ORDER — HYDROMORPHONE HCL 1 MG/ML IJ SOLN
0.2500 mg | INTRAMUSCULAR | Status: DC | PRN
  Administered 2023-09-19: 0.5 mg via INTRAVENOUS

## 2023-09-19 MED ORDER — OXYCODONE HCL 5 MG PO TABS: 5.0000 mg | ORAL_TABLET | Freq: Once | ORAL | Status: AC | PRN

## 2023-09-19 MED ORDER — DIPHENHYDRAMINE HCL 12.5 MG/5ML PO ELIX: 12.5000 mg | ORAL_SOLUTION | ORAL | Status: DC | PRN

## 2023-09-19 MED ORDER — METOCLOPRAMIDE HCL 5 MG/ML IJ SOLN: 5.0000 mg | Freq: Three times a day (TID) | INTRAMUSCULAR | Status: DC | PRN

## 2023-09-19 MED ORDER — FESOTERODINE FUMARATE ER 4 MG PO TB24
4.0000 mg | ORAL_TABLET | Freq: Every day | ORAL | Status: DC
  Administered 2023-09-20 – 2023-09-23 (×4): 4 mg via ORAL
  Filled 2023-09-19 (×4): qty 1

## 2023-09-19 MED ORDER — ONDANSETRON HCL 4 MG PO TABS: 4.0000 mg | ORAL_TABLET | Freq: Four times a day (QID) | ORAL | Status: DC | PRN

## 2023-09-19 MED ORDER — VANCOMYCIN HCL 2000 MG/400ML IV SOLN
2000.0000 mg | INTRAVENOUS | Status: DC
  Administered 2023-09-19 – 2023-09-22 (×4): 2000 mg via INTRAVENOUS
  Filled 2023-09-19 (×5): qty 400

## 2023-09-19 MED ORDER — PROPOFOL 10 MG/ML IV BOLUS
INTRAVENOUS | Status: AC
  Filled 2023-09-19: qty 20

## 2023-09-19 MED ORDER — OXYCODONE HCL 5 MG PO TABS
ORAL_TABLET | ORAL | Status: AC
  Administered 2023-09-19: 5 mg via ORAL
  Filled 2023-09-19: qty 1

## 2023-09-19 MED ORDER — DEXAMETHASONE SODIUM PHOSPHATE 10 MG/ML IJ SOLN
INTRAMUSCULAR | Status: AC
  Filled 2023-09-19: qty 1

## 2023-09-19 MED ORDER — BUPIVACAINE HCL (PF) 0.5 % IJ SOLN
INTRAMUSCULAR | Status: AC
  Filled 2023-09-19: qty 30

## 2023-09-19 MED ORDER — PROPOFOL 10 MG/ML IV BOLUS
INTRAVENOUS | Status: DC | PRN
  Administered 2023-09-19: 160 mg via INTRAVENOUS

## 2023-09-19 MED ORDER — ONDANSETRON HCL 4 MG/2ML IJ SOLN
INTRAMUSCULAR | Status: AC
  Filled 2023-09-19: qty 2

## 2023-09-19 MED ORDER — ONDANSETRON HCL 4 MG/2ML IJ SOLN
INTRAMUSCULAR | Status: DC | PRN
  Administered 2023-09-19: 4 mg via INTRAVENOUS

## 2023-09-19 MED ORDER — SODIUM CHLORIDE 0.9 % IV SOLN: INTRAVENOUS | Status: DC

## 2023-09-19 MED ORDER — LIDOCAINE HCL (CARDIAC) PF 100 MG/5ML IV SOSY
PREFILLED_SYRINGE | INTRAVENOUS | Status: DC | PRN
  Administered 2023-09-19: 100 mg via INTRAVENOUS

## 2023-09-19 MED ORDER — FENTANYL CITRATE (PF) 100 MCG/2ML IJ SOLN
INTRAMUSCULAR | Status: AC
  Filled 2023-09-19: qty 2

## 2023-09-19 MED ORDER — MIDAZOLAM HCL 5 MG/5ML IJ SOLN
INTRAMUSCULAR | Status: DC | PRN
  Administered 2023-09-19: 1 mg via INTRAVENOUS

## 2023-09-19 MED ORDER — HYDROCODONE-ACETAMINOPHEN 7.5-325 MG PO TABS
1.0000 | ORAL_TABLET | ORAL | Status: DC | PRN
Start: 2023-09-19 — End: 2023-09-23

## 2023-09-19 SURGICAL SUPPLY — 29 items
BAG COUNTER SPONGE SURGICOUNT (BAG) IMPLANT
BAG ZIPLOCK 12X15 (MISCELLANEOUS) ×1 IMPLANT
BANDAGE ESMARK 6X9 LF (GAUZE/BANDAGES/DRESSINGS) ×1 IMPLANT
BNDG ESMARK 6X9 LF (GAUZE/BANDAGES/DRESSINGS) ×1 IMPLANT
BNDG GAUZE DERMACEA FLUFF 4 (GAUZE/BANDAGES/DRESSINGS) ×1 IMPLANT
CANISTER WOUNDNEG PRESSURE 500 (CANNISTER) IMPLANT
COVER SURGICAL LIGHT HANDLE (MISCELLANEOUS) ×1 IMPLANT
CUFF TOURN SGL QUICK 18X4 (TOURNIQUET CUFF) IMPLANT
CUFF TRNQT CYL 24X4X16.5-23 (TOURNIQUET CUFF) IMPLANT
CUFF TRNQT CYL 34X4.125X (TOURNIQUET CUFF) IMPLANT
DRAIN PENROSE 0.5X18 (DRAIN) ×1 IMPLANT
DRAPE INCISE IOBAN 66X45 STRL (DRAPES) IMPLANT
DRSG VAC GRANUFOAM LG (GAUZE/BANDAGES/DRESSINGS) IMPLANT
DURAPREP 26ML APPLICATOR (WOUND CARE) ×1 IMPLANT
ELECT REM PT RETURN 15FT ADLT (MISCELLANEOUS) ×1 IMPLANT
GAUZE PAD ABD 8X10 STRL (GAUZE/BANDAGES/DRESSINGS) ×2 IMPLANT
GAUZE SPONGE 4X4 12PLY STRL (GAUZE/BANDAGES/DRESSINGS) ×1 IMPLANT
GLOVE BIO SURGEON STRL SZ7.5 (GLOVE) ×1 IMPLANT
GLOVE BIOGEL PI IND STRL 8 (GLOVE) ×1 IMPLANT
GLOVE ECLIPSE 8.0 STRL XLNG CF (GLOVE) ×1 IMPLANT
GOWN STRL REUS W/ TWL XL LVL3 (GOWN DISPOSABLE) ×1 IMPLANT
KIT BASIN OR (CUSTOM PROCEDURE TRAY) ×1 IMPLANT
KIT TURNOVER KIT A (KITS) IMPLANT
PACK ORTHO EXTREMITY (CUSTOM PROCEDURE TRAY) ×1 IMPLANT
PAD CAST 4YDX4 CTTN HI CHSV (CAST SUPPLIES) ×1 IMPLANT
PROTECTOR NERVE ULNAR (MISCELLANEOUS) ×1 IMPLANT
SET HNDPC FAN SPRY TIP SCT (DISPOSABLE) ×1 IMPLANT
SYR CONTROL 10ML LL (SYRINGE) ×1 IMPLANT
TOWEL OR 17X26 10 PK STRL BLUE (TOWEL DISPOSABLE) ×2 IMPLANT

## 2023-09-19 NOTE — Op Note (Signed)
Operative Note  Date of operation: 09/19/2023 Preoperative diagnosis: Recurrent seroma left posterior thigh Postoperative diagnosis: Same  Procedure: #1 incision and drainage/irrigation and debridement of left posterior thigh         #2 placement of deep VAC sponge left posterior thigh  Findings: Recurrent seroma left thigh  Surgeon: Vanita Panda. 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  He is been dealing with this for several months now.  We have seen him multiple times in the office and aspirated usually between 4 and 500 cc of fluid weekly off of the left posterior thigh.  This is likely indirectly related to her chronic left hip prosthetic joint infection.  However he has never had any issues with a chronic fluid collection in his posterior thigh on that left side until doing a lot of mowing riding a lawnmower for a long period of time there was a large mower when he developed this area of fluid collection.  About a month or so ago we did take him to the operating room and performed an open incision and drainage of this area.  We found some necrotic tissue but no gross purulence.  We placed a deep drain in the wound and an incisional VAC on to close the wound up.  We kept him in the hospital for 5 days and then remove the drain and discharged him to home.  He has since had a recurrence of his seroma and it is extensive.  It is certainly comparable to a [employer] lesion.  At this point we have aspirated several times in the office and we have recommended now an open incision and drainage again with this time placing a deep VAC sponge with him understanding that he will have to have this VAC changed every 3 to 5 days over the next several weeks in order to hopefully get the seroma to stop  reaccumulating and to close down the dead space.  The risk and benefits of surgery explained in detail.  Procedure description: After informed consent was obtained and the appropriate left thigh was marked, the patient is brought to the operating room and general anesthesia was obtained while he is on the stretcher.  He was then rolled into a prone position on the operating table with appropriate padding of his chest area and thigh areas.  All bony prominences were padded.  There is appropriate padding and position of the head neck and his eyes.  The right thigh was then prepped and draped with DuraPrep and sterile drapes.  A timeout was called and he identifies correct patient correct right thigh.  We then went through our previous posterior lateral based incision and opened up the thigh.  We found a large seroma again in a significant deep dead space.  Fortunately there was abundant granulation tissue and there was no significant necrotic tissue.  We irrigated out a large amount of fluid from his posterior thigh.  The muscle was contractile with electrocautery.  There was nothing that we could find that tracked deep but we did make a small opening to assess the deeper tissue.  We then irrigated 3 L normal saline solution using pulsatile lavage to the soft tissue.  Next we closed portions of the wound but then placed a large VAC sponge deep in the wound.  We  then placed it to the [facility] machine to suction at -125 mm pressure and it did close down nicely and had a good seal.  The patient was then rolled back into a supine position, awakened, extubated and taken to the recovery room in stable condition.

## 2023-09-19 NOTE — Transfer of Care (Signed)
Immediate Anesthesia Transfer of Care Note  Patient: [name]  Procedure(s) Performed: INCISION AND DRAINAGE LEFT THIGH WITH WOUND VAC PLACEMENT (Left)  Patient Location: PACU  Anesthesia Type:General  Level of Consciousness: awake, alert , oriented, and patient cooperative  Airway & Oxygen Therapy: Patient Spontanous Breathing and Patient connected to face mask oxygen  Post-op Assessment: Report given to RN and Post -op Vital signs reviewed and stable  Post vital signs: Reviewed and stable  Last Vitals:  Vitals Value Taken Time  BP 127/67 09/19/23 1517  Temp    Pulse 56 09/19/23 1520  Resp 16 09/19/23 1520  SpO2 98 % 09/19/23 1520  Vitals shown include unfiled device data.  Last Pain:  Vitals:   09/19/23 1141  TempSrc: Oral  PainSc: 3       Patients Stated Pain Goal: 5 (09/19/23 1141)  Complications: No notable events documented.

## 2023-09-19 NOTE — Anesthesia Preprocedure Evaluation (Addendum)
Anesthesia Evaluation  Patient identified by MRN, date of birth, ID band Patient awake    Reviewed: Allergy & Precautions, NPO status , Patient's Chart, lab work & pertinent test results  Airway Mallampati: III  TM Distance: >3 FB Neck ROM: Full    Dental  (+) Teeth Intact, Dental Advisory Given   Pulmonary neg pulmonary ROS   Pulmonary exam normal breath sounds clear to auscultation       Cardiovascular Exercise Tolerance: Good + Peripheral Vascular Disease  Normal cardiovascular exam Rhythm:Regular Rate:Normal     Neuro/Psych  Neuromuscular disease  negative psych ROS   GI/Hepatic negative GI ROS, Neg liver ROS,,,  Endo/Other  Obesity   Renal/GU Renal InsufficiencyRenal disease     Musculoskeletal  (+) Arthritis ,  recurrent seroma left thigh   Abdominal  (+) + obese  Peds  Hematology negative hematology ROS (+) Plt 251k    Anesthesia Other Findings Day of surgery medications reviewed with the patient.  Reproductive/Obstetrics                              Anesthesia Physical Anesthesia Plan  ASA: 3  Anesthesia Plan: General   Post-op Pain Management: Ofirmev IV (intra-op)*   Induction: Intravenous  PONV Risk Score and Plan: 2 and Dexamethasone and Ondansetron  Airway Management Planned: Oral ETT  Additional Equipment: None  Intra-op Plan:   Post-operative Plan: Extubation in OR  Informed Consent: I have reviewed the patients History and Physical, chart, labs and discussed the procedure including the risks, benefits and alternatives for the proposed anesthesia with the patient or authorized representative who has indicated his/her understanding and acceptance.     Dental advisory given  Plan Discussed with: CRNA  Anesthesia Plan Comments: (Risks of anesthesia explained at length. This includes, but is not limited to, sore throat, damage to teeth, lips gums, tongue  and vocal cords, nausea and vomiting, reactions to medications, stroke, heart attack, and death. All patient questions were answered and the patient wishes to proceed.  )         Anesthesia Quick Evaluation

## 2023-09-19 NOTE — Anesthesia Postprocedure Evaluation (Signed)
Anesthesia Post Note  Patient: [name]  Procedure(s) Performed: INCISION AND DRAINAGE LEFT THIGH WITH WOUND VAC PLACEMENT (Left)     Patient location during evaluation: PACU Anesthesia Type: General Level of consciousness: sedated and patient cooperative Pain management: pain level controlled Vital Signs Assessment: post-procedure vital signs reviewed and stable Respiratory status: spontaneous breathing Cardiovascular status: stable Anesthetic complications: no   No notable events documented.  Last Vitals:  Vitals:   09/19/23 1600 09/19/23 1615  BP: 119/64 125/69  Pulse: (!) 45 (!) 48  Resp: 13 18  Temp:    SpO2: 93% 95%    Last Pain:  Vitals:   09/19/23 1547  TempSrc:   PainSc: 6                  Lewie Loron

## 2023-09-19 NOTE — Anesthesia Procedure Notes (Signed)
Procedure Name: Intubation Date/Time: 09/19/2023 2:10 PM  Performed by: Dennison Nancy, CRNAPre-anesthesia Checklist: Patient identified, Emergency Drugs available, Suction available, Patient being monitored and Timeout performed Patient Re-evaluated:Patient Re-evaluated prior to induction Oxygen Delivery Method: Circle system utilized Preoxygenation: Pre-oxygenation with 100% oxygen Induction Type: IV induction Ventilation: Mask ventilation without difficulty Laryngoscope Size: Mac and 4 Grade View: Grade I Tube type: Oral Tube size: 7.5 mm Number of attempts: 1 Airway Equipment and Method: Stylet Placement Confirmation: ETT inserted through vocal cords under direct vision, positive ETCO2, CO2 detector and breath sounds checked- equal and bilateral Secured at: 23 cm Tube secured with: Tape (pink Hy-tape used to secure ETT) Dental Injury: Teeth and Oropharynx as per pre-operative assessment

## 2023-09-19 NOTE — Plan of Care (Signed)
  Problem: Coping: Goal: Level of anxiety will decrease Outcome: Progressing   Problem: Pain Management: Goal: General experience of comfort will improve Outcome: Progressing   Problem: Safety: Goal: Ability to remain free from injury will improve Outcome: Progressing

## 2023-09-19 NOTE — Interval H&P Note (Signed)
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  The plan with surgery today is to open up the thigh and to thoroughly irrigated out with then placing a VAC sponge to help with continued drainage.  There is been no acute or interval change in her medical status.  The risks and benefits of surgery were discussed in detail and informed consent has been obtained.  The left operative thigh has been marked.  09/19/2023 12:41 PM  [name]  has presented today for surgery, with the diagnosis of recurrent seroma left thigh.  The various methods of treatment have been discussed with the patient and family. After consideration of risks, benefits and other options for treatment, the patient has consented to  Procedure(s): INCISION AND DRAINAGE LEFT THIGH WITH WOUND VAC PLACEMENT (Left) as a surgical intervention.  The patient's history has been reviewed, patient examined, no change in status, stable for surgery.  I have reviewed the patient's chart and labs.  Questions were answered to the patient's satisfaction.     Kathryne Hitch

## 2023-09-19 NOTE — Progress Notes (Signed)
PHARMACY NOTE:  ANTIMICROBIAL RENAL DOSAGE ADJUSTMENT  Current antimicrobial regimen includes a mismatch between antimicrobial dosage and estimated renal function.  As per policy approved by the Pharmacy & Therapeutics and Medical Executive Committees, the antimicrobial dosage will be adjusted accordingly.  Current antimicrobial dosage:  Vancomycin 1 gm IV q12 x 5 days  Indication: surgical prophylaxis  Renal Function:  Estimated Creatinine Clearance: 84.3 mL/min (by C-G formula based on SCr of 0.95 mg/dL).    Antimicrobial dosage has been changed to:  Vancomycin 2 gm IV q24 x 5 days for [facility] 548.8 using SCr 0.95, Vd 0.5   Thank you for allowing pharmacy to be a part of this patient's care.  Herby Abraham, Pharm.D Use secure chat for questions 09/19/2023 5:23 PM

## 2023-09-20 ENCOUNTER — Encounter (HOSPITAL_COMMUNITY): Payer: Self-pay | Admitting: Orthopaedic Surgery

## 2023-09-20 NOTE — Progress Notes (Signed)
Patient ID: [name], male   DOB: 12-15-1945, 77 y.o.   MRN: [id number] The patient is awake and alert and comfortable this morning.  The VAC that is on his left posterior thigh is at -125 mmhg and has a good seal.  There is output in the canister.  I did explain to the patient that he will have this VAC in place for the next likely 6 weeks.  It will need to be changed through a home health agency about every 3 to more likely 5 days.  That way we can get this to close down the dead space and slowly heal the wound.  There was no worrisome features in the tissue itself.  We have consulted the transitional care team to assist in finding a home health agency that will can perform VAC changes up to every 5 days for the next 4 to 6 weeks.  He will remain in the hospital until this can be set up.

## 2023-09-21 NOTE — Progress Notes (Signed)
Patient ID: [name], male   DOB: Feb 24, 1946, 77 y.o.   MRN: [id number] The patient is awake and alert and comfortable this morning.  Good seal with the wound VAC.  There is approximately 100 cc serous sanguinous output in the canister.  He has been able to work with physical therapy and is tolerating a diet.  He is pending home services for home wound VAC changes every 5 days for the next 4 to 6 weeks.  This will likely be set up pending tomorrow.  Plan for continued inpatient stay today

## 2023-09-22 NOTE — Progress Notes (Signed)
Patient ID: [name], male   DOB: 1946-07-11, 77 y.o.   MRN: [id number] The patient is awake and alert.  He is comfortable.  His vital signs are stable.  The left thigh VAC is to suction and has a good seal.  It has had good output.  This has completely decompressed the recurrent seroma that he has had with his left thigh.  He will need to have the VAC in place and changed every 3 to 5 days for the next 4 to 6 weeks.  The transitional care team has been consulted to assist in hopefully lining up home health nursing care they can perform these wound VAC changes.  The external wound measures only about 5 cm in diameter and is about a centimeter deep but the VAC is packed into the seroma area dead space.  This is what will need to be changed again every 3 to 5 days.  His discharge is pending this being set up for him.

## 2023-09-22 NOTE — Plan of Care (Signed)
  Problem: Activity: Goal: Risk for activity intolerance will decrease Outcome: Progressing   Problem: Coping: Goal: Level of anxiety will decrease Outcome: Progressing   Problem: Pain Management: Goal: General experience of comfort will improve Outcome: Progressing

## 2023-09-23 LAB — CREATININE, SERUM
Creatinine, Ser: 0.83 mg/dL (ref 0.61–1.24)
GFR, Estimated: >60 mL/min (ref >60)

## 2023-09-23 MED ORDER — HYDROCODONE-ACETAMINOPHEN 7.5-325 MG PO TABS: 1.0000 | ORAL_TABLET | Freq: Three times a day (TID) | ORAL | 0 refills | Status: DC | PRN

## 2023-09-23 NOTE — Care Management Important Message (Signed)
Important Message  Patient Details IM Letter given. Name: ZAYN CLEMENCE MRN: [id number] Date of Birth: [birth year]   Important Message Given:  Yes - Medicare IM     [name] 09/23/2023, 11:09 AM

## 2023-09-23 NOTE — Plan of Care (Signed)
  Problem: Activity: Goal: Risk for activity intolerance will decrease Outcome: Progressing   Problem: Pain Management: Goal: General experience of comfort will improve Outcome: Progressing   Problem: Safety: Goal: Ability to remain free from injury will improve Outcome: Progressing

## 2023-09-23 NOTE — Progress Notes (Signed)
Patient ID: [name], male   DOB: Jul 08, 1946, 77 y.o.   MRN: [id number] The patient is doing well overall.  The VAC on his left thigh continues to have a good seal and there is output.  The transitional care team has been instrumental in working on getting the [facility] approved for home.  I will put in discharge orders today for just in case this can happen today.  He is otherwise stable.

## 2023-09-23 NOTE — TOC Transition Note (Signed)
Transition of Care [facility]) - Discharge Note   Patient Details  Name: [name] MRN: [id number] Date of Birth: 21-Nov-1945  Transition of Care [facility]) CM/SW Contact:  Amada Jupiter, LCSW Phone Number: 09/23/2023, 11:05 AM   Clinical Narrative:    Met with pt yesterday and today to prepare for dc home.  MD has ordered wound VAC for home use; patient aware and no agency preference - order placed with [employer] and unit has been delivered to pt's room this morning.  HHRN ordered as well and pt requests [facility] who has followed him in the past - referral placed/ accepted with [facility].  No further TOC needs.     Final next level of care: Home w Home Health Services Barriers to Discharge: Barriers Resolved   Patient Goals and CMS Choice Patient states their goals for this hospitalization and ongoing recovery are:: return home          Discharge Placement                       Discharge Plan and Services Additional resources added to the After Visit Summary for                  DME Arranged: Negative pressure wound device DME Agency: [facility] Date DME Agency Contacted: 09/22/23 Time DME Agency Contacted: 0900 Representative spoke with at DME Agency: Blossom Hoops HH Arranged: RN HH Agency: [facility] Date [facility] Agency Contacted: 09/22/23 Time HH Agency Contacted: 1200 Representative spoke with at [facility] Agency: Kandee Keen  Social Drivers of Health (SDOH) Interventions SDOH Screenings   Food Insecurity: No Food Insecurity (09/19/2023)  Housing: Low Risk  (09/19/2023)  Transportation Needs: No Transportation Needs (09/19/2023)  Utilities: Not At Risk (09/19/2023)  Alcohol Screen: Low Risk  (01/03/2022)  Depression (PHQ2-9): Low Risk  (05/05/2023)  Financial Resource Strain: Low Risk  (03/26/2023)  Physical Activity: Sufficiently Active (03/26/2023)  Social Connections: Socially Integrated (03/26/2023)  Stress: No Stress Concern Present (03/26/2023)  Tobacco Use: Low Risk   (09/19/2023)     Readmission Risk Interventions    09/23/2023   11:03 AM 01/06/2023   12:56 PM  Readmission Risk Prevention Plan  Post Dischage Appt Complete Complete  Medication Screening Complete Complete  Transportation Screening Complete Complete

## 2023-09-23 NOTE — Progress Notes (Signed)
Discharge package printed and instructions given to pt. Pt verbalizes understanding. 

## 2023-09-23 NOTE — Discharge Summary (Signed)
Patient ID: [name] MRN: [id number] DOB/AGE: [birth year] 78 y.o.  Admit date: 09/19/2023 Discharge date: 09/23/2023  Admission Diagnoses:  Principal Problem:   Recurrent seroma left thigh Active Problems:   Traumatic seroma of left thigh, subsequent encounter   Discharge Diagnoses:  Same  Past Medical History:  Diagnosis Date   Anterior dislocation of right shoulder 09/2013   Reduced in ED under sedation.  Dislocated posteriorly 2015, ? partial RC tear, has f/u planned with Dr. August Saucer--? surgery? possible plan for 2016.   Blood donor, whole blood    Has donated approx 30 gallons in his lifetime   Chronic left hip pain    Severe DJD.  THA 2008.  Left acetabular revision in 2017 AND 2019.   Chronic pain syndrome    Candidate for spinal cord stimulator (03/2017)   Chronic renal insufficiency, stage II (mild)    Chronic venous insufficiency    +compression hose   DDD (degenerative disc disease), lumbar 2017   Dr. Joette Catching ortho.  He is now wearing a new leg brace.  Has had ESI and will get another.   Diverticulosis    +redundant colon   DVT (deep venous thrombosis) (HCC) 06/14/2016   provoked, bilateral LL's; in post-op setting s/p hip surgery.  Xarelto x 71mo at full dosing, then 6 more months at 1/2 dosing, then stopped xarelto.     Foot drop, left    s/p hip fracture 1969   Hx of adenomatous colonic polyps    polypectomy 2007; 2012; 2015; 05/2019; recall 71yrs   Hypercholesterolemia 02/2017   Recommended statin 02/10/17 but pt declined, wanted to do TLC trial first. Rec'd statin 04/2020   IFG (impaired fasting glucose)    04/2023 fasting 111, a1c 5.7%   Osteoarthritis    knees and hips primarily   Post laminectomy syndrome    improved with spinal cord stim-->stim to be removed summer/fall 2019.   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 PSA 0.16 Feb 2020 urol f/u. [name]054 Aug 2021 urol. <0.015 02/2021.   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 male who was admitted 09/19/2023 for operative treatment ofTraumatic seroma of thigh, left, subsequent encounter.  The patient had recurrent seromas drained on multiple occasions from his left thigh and even had had a previous surgery to address this.  On this hospitalization, we took him to the operating room and were able to evacuate large hematoma and irrigate the soft tissues.  We then placed a VAC deep into the seroma to close down the dead space.  The VAC had an excellent seal and suction during his hospitalization and it was arranged for him to be able to go home with VAC changes up to twice weekly for the next 6 weeks to allow this wound to slowly heal and to keep the seroma from reoccurring.  Patient was given perioperative antibiotics:  Anti-infectives (From admission, onward)    Start     Dose/Rate Route Frequency Ordered Stop   09/19/23 2000  vancomycin (VANCOREADY) IVPB 2000 mg/400 mL  2,000 mg 200 mL/hr over 120 Minutes Intravenous Every 24 hours 09/19/23 1721 09/24/23 1959   09/19/23 1800  vancomycin (VANCOCIN) IVPB 1000 mg/200 mL premix  Status:  Discontinued        1,000 mg 200 mL/hr over 60 Minutes Intravenous Every 12 hours 09/19/23 1704 09/19/23 1721   09/19/23 1130  ceFAZolin (ANCEF) IVPB 2g/100 mL premix        2 g 200 mL/hr over 30 Minutes Intravenous On call to O.R. 09/19/23 1116 09/19/23 1457        Patient was given sequential compression devices, early ambulation, and chemoprophylaxis to prevent DVT.  Patient benefited maximally  from hospital stay and there were no complications.    Recent vital signs: Patient Vitals for the past 24 hrs:  BP Temp Temp src Pulse Resp SpO2  09/23/23 1349 (!) 144/80 97.6 F (36.4 C) Oral (!) 55 17 99 %  09/23/23 0615 133/72 98.1 F (36.7 C) Oral (!) 52 17 99 %  09/22/23 2124 132/67 98 F (36.7 C) Oral (!) 52 16 97 %     Recent laboratory studies:  Recent Labs    09/23/23 0331  CREATININE 0.83     Discharge Medications:   Allergies as of 09/23/2023       Reactions   Adhesive [tape] Rash   Paper tape is ok        Medication List     TAKE these medications    acetaminophen 500 MG tablet Commonly known as: TYLENOL Take 1,000 mg by mouth 2 (two) times daily as needed for moderate pain or headache.   aspirin EC 81 MG tablet Take 81 mg by mouth daily.   atorvastatin 20 MG tablet Commonly known as: LIPITOR Take 1 tablet (20 mg total) by mouth daily. What changed: when to take this   CALCIUM + D3 PO Take 1 tablet by mouth daily.   cefadroxil 500 MG capsule Commonly known as: DURICEF Take 1,000 mg by mouth 2 (two) times daily.   HYDROcodone-acetaminophen 7.5-325 MG tablet Commonly known as: NORCO Take 1-2 tablets by mouth 3 (three) times daily as needed for severe pain (pain score 7-10).   solifenacin 5 MG tablet Commonly known as: VESICARE Take 5 mg by mouth daily.        Diagnostic Studies: No results found.  Disposition: Discharge disposition: 01-Home or Self Care          Follow-up Information     Kathryne Hitch, MD. Schedule an appointment as soon as possible for a visit in 2 week(s).   Specialty: Orthopedic Surgery Contact information: [address] [place] [address] [phone]         Care, [facility] Follow up.   Specialty: Home Health Services Why: to provide home health nursing visits Contact information: [address] [place] [address] [phone]                   Signed: Kathryne Hitch 09/23/2023, 2:34 PM

## 2023-09-23 NOTE — Consult Note (Signed)
Value-Based Care [facility] Liaison Consult Note    09/23/2023  [name] 1946/01/06 [id number]  Insurance: [employer]   Primary Care Provider: Jeoffrey Massed, MD with [place] [facility], this provider is listed for the transition of care follow up appointments  and Transition of Care calls   [facility] Liaison screened the patient remotely at [facility]. 10:06 am Spoke with patient via bedside phone, HIPAA verified.  Patient endorses PCP and plans for home and Wound VAC just arrived.  He states no known needs at this time.  Explained he will likely have follow up calls from [facility] team and encouraged to reach out to HTA concierge for any insurance concerns.  Patient verbalized understanding and encourage to review MyChart, as well.    The patient was screened for 30 day readmission hospitalization with noted low risk score for unplanned readmission risk 2 hospital admissions in 6 months. Patient was scheduled for surgery noted   The patient was assessed for potential [facility] Coordination service needs for post hospital transition for care coordination. Review of patient's electronic medical record reveals patient is for home and likely with a wound VAC noted.   Plan: [facility] Liaison will continue to follow progress and disposition to asess for post hospital community care coordination needs.  Referral request for community care coordination: No additional needs assessed reviewed POC and [facility] for needs.  Anticipate the patient will have post hospital follow up with the [facility] team.   [facility], Population Health does not replace or interfere with any arrangements made by the Inpatient Transition of Care team.   For questions contact:   Charlesetta Shanks, RN, BSN, CCM [place]  [facility], Population Health, [facility] Liaison Direct Dial: [phone] or secure chat Email:  [email or web address]

## 2023-09-23 NOTE — Discharge Instructions (Signed)
Increase your activities as comfort allows.

## 2023-09-24 ENCOUNTER — Telehealth: Payer: Self-pay

## 2023-09-24 NOTE — Transitions of Care (Post Inpatient/ED Visit) (Signed)
09/24/2023  Name: [name] MRN: [id number] DOB: 1946/01/08  Today's TOC FU Call Status: Today's TOC FU Call Status:: Successful TOC FU Call Completed TOC FU Call Complete Date: 09/23/23 Patient's Name and Date of Birth confirmed.  Transition Care Management Follow-up Telephone Call Date of Discharge: 09/23/23 Discharge Facility: [facility]) Type of Discharge: Inpatient Admission Primary Inpatient Discharge Diagnosis:: Recurrent seroma left thigh How have you been since you were released from the hospital?: Better (reports wearing wound vac.) Any questions or concerns?: No  Items Reviewed: Did you receive and understand the discharge instructions provided?: Yes Medications obtained,verified, and reconciled?: Yes (Medications Reviewed) Any new allergies since your discharge?: No Dietary orders reviewed?: Yes Type of Diet Ordered:: regular diet Do you have support at home?: Yes People in Home: spouse, child(ren), adult Name of Support/Comfort Primary Source: wife, daughter  Medications Reviewed Today: Medications Reviewed Today     Reviewed by Earlie Server, RN (Registered Nurse) on 09/24/23 at [phone]  Med List Status: <None>   Medication Order Taking? Sig Documenting Provider Last Dose Status Informant  acetaminophen (TYLENOL) 500 MG tablet [id number] Yes Take 1,000 mg by mouth 2 (two) times daily as needed for moderate pain or headache.  [provider] Taking Active Self  aspirin EC 81 MG tablet [id number] Yes Take 81 mg by mouth daily. [provider] Taking Active Self  atorvastatin (LIPITOR) 20 MG tablet [id number] Yes Take 1 tablet (20 mg total) by mouth daily.  Patient taking differently: Take 20 mg by mouth at bedtime.   Jeoffrey Massed, MD Taking Active Self  Calcium Carb-Cholecalciferol (CALCIUM + D3 PO) [id number] Yes Take 1 tablet by mouth daily. [provider] Taking Active Self  cefadroxil (DURICEF) 500 MG capsule [id number]  Yes Take 1,000 mg by mouth 2 (two) times daily. [provider] Taking Active Self  HYDROcodone-acetaminophen (NORCO) 7.5-325 MG tablet [id number] Yes Take 1-2 tablets by mouth 3 (three) times daily as needed for severe pain (pain score 7-10). Kathryne Hitch, MD Taking Active   solifenacin (VESICARE) 5 MG tablet [id number] Yes Take 5 mg by mouth daily. [provider] Taking Active Self            Home Care and Equipment/Supplies: Were Home Health Services Ordered?: Yes Name of Home Health Agency:: [facility] Has Agency set up a time to come to your home?: Yes First Home Health Visit Date: 09/25/23 Any new equipment or medical supplies ordered?: Yes Name of Medical supply agency?: [facility] -  Suplied at the hosptial Were you able to get the equipment/medical supplies?: Yes Do you have any questions related to the use of the equipment/supplies?: No  Functional Questionnaire: Do you need assistance with bathing/showering or dressing?: Yes (wife) Do you need assistance with meal preparation?: Yes (wife) Do you need assistance with eating?: No Do you have difficulty maintaining continence: No Do you need assistance with getting out of bed/getting out of a chair/moving?: No Do you have difficulty managing or taking your medications?: No  Follow up appointments reviewed: PCP Follow-up appointment confirmed?: No MD Provider Line [phone] Given: No Specialist Hospital Follow-up appointment confirmed?: Yes Date of Specialist follow-up appointment?: 10/09/23 Follow-Up Specialty Provider:: Magnus Ivan Do you need transportation to your follow-up appointment?: No Do you understand care options if your condition(s) worsen?: Yes-patient verbalized understanding  SDOH Interventions Today    Flowsheet Row Most Recent Value  SDOH Interventions   Food Insecurity Interventions Intervention Not Indicated  Housing Interventions Intervention  Not Indicated   Transportation Interventions Intervention Not Indicated  Utilities Interventions Intervention Not Indicated      Interventions Today    Flowsheet Row Most Recent Value  Nutrition Interventions   Nutrition Discussed/Reviewed Nutrition Discussed  Pharmacy Interventions   Pharmacy Dicussed/Reviewed Medications and their functions      Reviewed with patient all discharge instructions. Reviewed importance of timely follow up with PCP. Offered to assist with appointment scheduling and patient wanted to do this himself.   Provided patient the contact phone number for [facility]  Home visit is planned for tomorrow. Patient reports that he is self managing well with his wound vac with the assistance of his wife.  Offered 30 day TOC program and patient declined.  Provided my contact information for patient to call me if needed.  Lonia Chimera, RN, BSN, CEN [employer]- Transition of Care Team.  [facility] [phone]

## 2023-09-25 ENCOUNTER — Telehealth: Payer: Self-pay | Admitting: Orthopaedic Surgery

## 2023-09-25 DIAGNOSIS — T8452XD Infection and inflammatory reaction due to internal left hip prosthesis, subsequent encounter: Secondary | ICD-10-CM | POA: Diagnosis not present

## 2023-09-25 DIAGNOSIS — Z792 Long term (current) use of antibiotics: Secondary | ICD-10-CM | POA: Diagnosis not present

## 2023-09-25 DIAGNOSIS — M21372 Foot drop, left foot: Secondary | ICD-10-CM | POA: Diagnosis not present

## 2023-09-25 DIAGNOSIS — E7800 Pure hypercholesterolemia, unspecified: Secondary | ICD-10-CM | POA: Diagnosis not present

## 2023-09-25 DIAGNOSIS — Z8546 Personal history of malignant neoplasm of prostate: Secondary | ICD-10-CM | POA: Diagnosis not present

## 2023-09-25 DIAGNOSIS — Z9181 History of falling: Secondary | ICD-10-CM | POA: Diagnosis not present

## 2023-09-25 DIAGNOSIS — M51369 Other intervertebral disc degeneration, lumbar region without mention of lumbar back pain or lower extremity pain: Secondary | ICD-10-CM | POA: Diagnosis not present

## 2023-09-25 DIAGNOSIS — M170 Bilateral primary osteoarthritis of knee: Secondary | ICD-10-CM | POA: Diagnosis not present

## 2023-09-25 DIAGNOSIS — M71052 Abscess of bursa, left hip: Secondary | ICD-10-CM | POA: Diagnosis not present

## 2023-09-25 DIAGNOSIS — G894 Chronic pain syndrome: Secondary | ICD-10-CM | POA: Diagnosis not present

## 2023-09-25 DIAGNOSIS — B957 Other staphylococcus as the cause of diseases classified elsewhere: Secondary | ICD-10-CM | POA: Diagnosis not present

## 2023-09-25 DIAGNOSIS — Z452 Encounter for adjustment and management of vascular access device: Secondary | ICD-10-CM | POA: Diagnosis not present

## 2023-09-25 DIAGNOSIS — T8452XA Infection and inflammatory reaction due to internal left hip prosthesis, initial encounter: Secondary | ICD-10-CM | POA: Diagnosis not present

## 2023-09-25 DIAGNOSIS — Z96612 Presence of left artificial shoulder joint: Secondary | ICD-10-CM | POA: Diagnosis not present

## 2023-09-25 DIAGNOSIS — N3941 Urge incontinence: Secondary | ICD-10-CM | POA: Diagnosis not present

## 2023-09-25 DIAGNOSIS — Z7982 Long term (current) use of aspirin: Secondary | ICD-10-CM | POA: Diagnosis not present

## 2023-09-25 DIAGNOSIS — L03116 Cellulitis of left lower limb: Secondary | ICD-10-CM | POA: Diagnosis not present

## 2023-09-25 DIAGNOSIS — K5730 Diverticulosis of large intestine without perforation or abscess without bleeding: Secondary | ICD-10-CM | POA: Diagnosis not present

## 2023-09-25 DIAGNOSIS — B9561 Methicillin susceptible Staphylococcus aureus infection as the cause of diseases classified elsewhere: Secondary | ICD-10-CM | POA: Diagnosis not present

## 2023-09-25 DIAGNOSIS — M1611 Unilateral primary osteoarthritis, right hip: Secondary | ICD-10-CM | POA: Diagnosis not present

## 2023-09-25 DIAGNOSIS — Z860101 Personal history of adenomatous and serrated colon polyps: Secondary | ICD-10-CM | POA: Diagnosis not present

## 2023-09-25 DIAGNOSIS — T8131XD Disruption of external operation (surgical) wound, not elsewhere classified, subsequent encounter: Secondary | ICD-10-CM | POA: Diagnosis not present

## 2023-09-25 DIAGNOSIS — R7303 Prediabetes: Secondary | ICD-10-CM | POA: Diagnosis not present

## 2023-09-25 DIAGNOSIS — N182 Chronic kidney disease, stage 2 (mild): Secondary | ICD-10-CM | POA: Diagnosis not present

## 2023-09-25 DIAGNOSIS — G629 Polyneuropathy, unspecified: Secondary | ICD-10-CM | POA: Diagnosis not present

## 2023-09-25 DIAGNOSIS — M96842 Postprocedural seroma of a musculoskeletal structure following a musculoskeletal system procedure: Secondary | ICD-10-CM | POA: Diagnosis not present

## 2023-09-25 DIAGNOSIS — I872 Venous insufficiency (chronic) (peripheral): Secondary | ICD-10-CM | POA: Diagnosis not present

## 2023-09-25 DIAGNOSIS — M961 Postlaminectomy syndrome, not elsewhere classified: Secondary | ICD-10-CM | POA: Diagnosis not present

## 2023-09-25 DIAGNOSIS — Z86718 Personal history of other venous thrombosis and embolism: Secondary | ICD-10-CM | POA: Diagnosis not present

## 2023-09-25 NOTE — Telephone Encounter (Signed)
Wound looks great, sutures holding. no escare, 25 - 30ml. Open area 6cm length, 3cm width,  1.5cm depth 12oclock to 6oclock undermining aspect 4cm deep 9oclock - 12oclock top aspect 2.5cm deep packed with  1 piece of black foam, changed today, had to soak was not previously changed Plans to change again Monday & following Friday.

## 2023-09-25 NOTE — Telephone Encounter (Signed)
Denise from [facility] has orders for home health for the wound vac. CB#[phone]

## 2023-09-26 DIAGNOSIS — T8189XA Other complications of procedures, not elsewhere classified, initial encounter: Secondary | ICD-10-CM | POA: Diagnosis not present

## 2023-10-02 DIAGNOSIS — R3915 Urgency of urination: Secondary | ICD-10-CM | POA: Diagnosis not present

## 2023-10-02 DIAGNOSIS — E349 Endocrine disorder, unspecified: Secondary | ICD-10-CM | POA: Diagnosis not present

## 2023-10-02 DIAGNOSIS — C61 Malignant neoplasm of prostate: Secondary | ICD-10-CM | POA: Diagnosis not present

## 2023-10-02 DIAGNOSIS — Z8546 Personal history of malignant neoplasm of prostate: Secondary | ICD-10-CM | POA: Diagnosis not present

## 2023-10-03 ENCOUNTER — Other Ambulatory Visit: Payer: Self-pay

## 2023-10-03 MED ORDER — CEFADROXIL 500 MG PO CAPS: 1000.0000 mg | ORAL_CAPSULE | Freq: Two times a day (BID) | ORAL | 0 refills | Status: DC

## 2023-10-09 ENCOUNTER — Ambulatory Visit: Payer: PPO | Admitting: Orthopaedic Surgery

## 2023-10-09 ENCOUNTER — Encounter: Payer: Self-pay | Admitting: Orthopaedic Surgery

## 2023-10-09 DIAGNOSIS — Z9889 Other specified postprocedural states: Secondary | ICD-10-CM

## 2023-10-09 DIAGNOSIS — T792XXD Traumatic secondary and recurrent hemorrhage and seroma, subsequent encounter: Secondary | ICD-10-CM

## 2023-10-09 DIAGNOSIS — T8189XA Other complications of procedures, not elsewhere classified, initial encounter: Secondary | ICD-10-CM | POA: Diagnosis not present

## 2023-10-09 NOTE — Progress Notes (Signed)
 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  He had developed a Nella Lavelle lesion.  This is in an area where he also has an infected hip prosthesis.  We took him to the OR last we left the wound open with placing a VAC sponge.  He has had outpatient treatments now with home health wound nursing care and they have been changing the VAC at least twice weekly.  He reports that he is doing well and he has not felt a large fluid collection that is her left posterior thigh.  I did take the VAC sponge down.  He does have a small granulating wound that looks good overall.  I did remove sutures and then placed a new VAC sponge.  He will continue home health wound treatments for the next 4 weeks as this is hopefully continuing to granulate and get smaller.  Thus far the skin and soft tissue look good.  We will see him back in 4 weeks for wound check.

## 2023-10-14 ENCOUNTER — Ambulatory Visit (INDEPENDENT_AMBULATORY_CARE_PROVIDER_SITE_OTHER): Payer: PPO | Admitting: Internal Medicine

## 2023-10-14 ENCOUNTER — Ambulatory Visit: Payer: PPO | Admitting: Internal Medicine

## 2023-10-14 ENCOUNTER — Other Ambulatory Visit: Payer: Self-pay

## 2023-10-14 ENCOUNTER — Encounter: Payer: Self-pay | Admitting: Internal Medicine

## 2023-10-14 VITALS — BP 160/80 | HR 55 | Temp 97.9°F | Ht 73.0 in | Wt 262.0 lb

## 2023-10-14 DIAGNOSIS — Z96642 Presence of left artificial hip joint: Secondary | ICD-10-CM | POA: Diagnosis not present

## 2023-10-14 DIAGNOSIS — T8459XD Infection and inflammatory reaction due to other internal joint prosthesis, subsequent encounter: Secondary | ICD-10-CM | POA: Diagnosis not present

## 2023-10-14 DIAGNOSIS — Z96649 Presence of unspecified artificial hip joint: Secondary | ICD-10-CM | POA: Diagnosis not present

## 2023-10-14 DIAGNOSIS — T792XXA Traumatic secondary and recurrent hemorrhage and seroma, initial encounter: Secondary | ICD-10-CM | POA: Diagnosis not present

## 2023-10-14 DIAGNOSIS — T8452XD Infection and inflammatory reaction due to internal left hip prosthesis, subsequent encounter: Secondary | ICD-10-CM | POA: Diagnosis not present

## 2023-10-14 NOTE — Progress Notes (Signed)
 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   Pain in left hip 09/01/2017   History of revision of total replacement of left hip joint 09/01/2017   Encounter for surveillance of recalled total hip arthroplasty hardware (HCC) 06/07/2016   Status post revision of total hip replacement 06/07/2016   Spinal stenosis, lumbar region, 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   Red blood cell abnormality 02/11/2016   Elevated blood pressure reading without diagnosis of hypertension 07/01/2013   Peripheral neuropathy 06/02/2013   Health maintenance examination 11/26/2011   Prostate cancer (HCC) 05/21/2010   Obesity, Class I, BMI 30-34.9 05/18/2010   Venous (peripheral) insufficiency 05/18/2010   Osteoarthrosis involving lower leg 05/18/2010      Subjective:    Patient ID: [name], male    DOB: 07/03/1946, 78 y.o.   MRN: [id number]  Chief Complaint  Patient presents with   Follow-up    HPI:  [name] is a 78 y.o. 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 Here for id clinic f/u   01/16/23 id f/u 01/03/23 operative culture mssa Patient doing  well Hip incision has one little spot still not closed yet. All sutures intact. There is a lot of serosanguinous drainage and he had to change pad 2-3 times a day depending on activity No pain along the incision or in the groin.  He'll see dr Vernetta today  He is taking also the cefadroxil  together with cefazolin  which he didn't know he not supposed to   02/21/23 id clinic f/u Doing well on cephadroxil on 02/15/23; picc removed No hip pain Opat labs normalized crp The incision had fully closed as of 02/20/23  10/14/23 id clinic f/u See a&p   Allergies  Allergen Reactions   Adhesive [Tape] Rash    Paper tape is ok      Outpatient Medications Prior to Visit  Medication Sig Dispense Refill   acetaminophen  (TYLENOL ) 500 MG tablet Take 1,000 mg by mouth 2 (two) times daily as needed for moderate pain or headache.      aspirin  EC 81 MG tablet Take 81 mg by mouth daily.     atorvastatin  (LIPITOR) 20 MG tablet Take 1 tablet (20 mg total) by mouth daily. (Patient taking differently: Take 20 mg by mouth at bedtime.) 90 tablet 3   Calcium  Carb-Cholecalciferol  (CALCIUM  + D3 PO) Take 1 tablet by mouth daily.     cefadroxil  (DURICEF) 500 MG capsule Take 2 capsules (1,000 mg total) by mouth 2 (two) times daily. 120 capsule 0   HYDROcodone -acetaminophen  (NORCO) 7.5-325 MG tablet Take 1-2 tablets by mouth 3 (three) times daily as needed for severe pain (pain score 7-10). 30 tablet 0   solifenacin (VESICARE) 5 MG tablet Take  5 mg by mouth daily.     No facility-administered medications prior to visit.     Social History   Socioeconomic History   Marital status: Married    Spouse name: Not on file   Number of children: Not on file   Years of education: Not on file   Highest education level: Not on file  Occupational History   Not on file  Tobacco Use   Smoking status: Never   Smokeless tobacco: Never  Vaping Use   Vaping status: Never Used  Substance and Sexual Activity   Alcohol  use: No   Drug use: No   Sexual activity: Not Currently  Other Topics Concern   Not on file  Social History Narrative   Married, 2 biologic children, 2 adopted, 5 GC.   Lives in [place].  Worked 35 yrs at [employer].   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   No formal exercise.  No T/A/Ds.   Enjoys woodworking.   Social Drivers of Corporate Investment Banker Strain: Low Risk  (03/26/2023)   Overall Financial Resource Strain (CARDIA)    Difficulty of Paying Living Expenses: Not hard at all  Food Insecurity: No Food Insecurity (09/24/2023)   Hunger Vital Sign    Worried About Running Out of Food in the Last Year: Never true    Ran Out of Food in the Last Year: Never true  Transportation Needs: No Transportation Needs (09/24/2023)   PRAPARE - Administrator, Civil Service (Medical): No    Lack of Transportation (Non-Medical): No  Physical Activity: Sufficiently Active (03/26/2023)   Exercise Vital Sign    Days of Exercise per Week: 7 days    Minutes of Exercise per Session: 30 min  Stress: No Stress Concern Present (03/26/2023)   [employer] of Occupational Health - Occupational Stress Questionnaire    Feeling of Stress : Not at all  Social Connections: Socially Integrated (03/26/2023)   Social Connection and Isolation Panel [NHANES]    Frequency of Communication with Friends and Family: More than three times a week    Frequency of Social Gatherings with Friends and Family: More than three times a week    Attends Religious Services: More than 4 times per year    Active Member of [employer] or Organizations: Yes    Attends Banker Meetings: 1 to 4 times per year    Marital Status: Married  Catering Manager Violence: Not At Risk (09/24/2023)   Humiliation, Afraid, Rape, and Kick questionnaire    Fear of Current or Ex-Partner: No    Emotionally Abused: No    Physically Abused: No    Sexually Abused: No      Review of Systems    All  other ros negative   Objective:    BP (!) 160/80   Pulse (!) 55   Temp 97.9 F (36.6 C) (Temporal)   Ht 6' 1 (1.854 m)   Wt 262 lb (118.8 kg)   SpO2 98%   BMI 34.57 kg/m  Nursing note and vital signs reviewed.  Physical Exam     General/constitutional: no distress, pleasant HEENT: Normocephalic, PER, Conj Clear, EOMI, Oropharynx clear Neck supple CV: rrr no mrg Lungs: clear to auscultation, normal respiratory effort Abd: Soft, Nontender Ext: no edema Skin: No Rash Neuro: nonfocal MSK: left hip wound vac functioning    Labs: Lab Results  Component Value Date   WBC 6.9 09/17/2023  HGB 11.6 (L) 09/17/2023   HCT 37.9 (L) 09/17/2023   MCV 91.8 09/17/2023   PLT 350 09/17/2023   Last metabolic panel Lab Results  Component Value Date   GLUCOSE 108 (H) 09/17/2023   NA 137 09/17/2023   K 4.3 09/17/2023   CL 107 09/17/2023   CO2 23 09/17/2023   BUN 17 09/17/2023   CREATININE 0.83 09/23/2023   GFRNONAA >60 09/23/2023   CALCIUM  9.6 09/17/2023   PHOS 2.5 05/21/2010   PROT 6.6 08/07/2023   ALBUMIN  3.4 (L) 08/07/2023   BILITOT 0.6 08/07/2023   ALKPHOS 71 08/07/2023   AST 14 (L) 08/07/2023   ALT 13 08/07/2023   ANIONGAP 7 09/17/2023     Micro:  Serology:  Imaging:  Assessment & Plan:   Problem List Items Addressed This Visit   None Visit Diagnoses       Infection of prosthetic hip joint, subsequent encounter    -  Primary   Relevant Orders   C-reactive protein   CBC   COMPLETE METABOLIC PANEL WITH GFR     Seroma due to trauma (HCC)       Relevant Orders   C-reactive protein   CBC   COMPLETE METABOLIC PANEL WITH GFR         No orders of the defined types were placed in this encounter.      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 Here for id clinic f/u   He is followed by dr Elaine. Last seen 05/08/22 in clinic. Chart mention 03/2020 had a short course of rifampin  combination cefazolin  during the most recent I&D prior to this admission.    S/p I&D 01/03/23 this admission. Cx mssa again (grew in 2017, 2019, and 2021). 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 Hardware retained     I am not optimistic in curing this or suppressing this given the history/operative finding   No role for rifampin  in his case   Had planned 6 weeks iv cefazolin  then transitioned to indefinite suppression on 02/14/23.   ------------ 01/16/23 id assessment  #lab monitoring 4/08 opat labs cbc 5/14/213; cr 0.8; crp 19 (<10)  #prosthetic joint recurrent/chronic infection -- mssa S/p multiple surgery; last washout 01/03/23 with I&D and sinus tract removal -- no hardware exchange  -advise to stop cefadroxil  for now while on cefazolin  -once done with cefazolin  on 02/14/23, can remove picc and start cefadroxil  indefinitely  02/21/23 id assessment Labs and clinically looking very good Will continue cefadroxil  1000 mg twice a day for the next 6 months; after that if clinically looking good still will decrease to 500 mg twice a day indefinitely  F/u in 3 months   05/20/23 id assessment Doing well He thinks since he's been taking twice a day  high dose cefadroxil  he is more prone to bruising. He has a big hematoma on left buttock from just riding lawn mower He takes asa 81 mg once a day; no herbal supplement; no anticoagulant It would be very unusual if related to cefadroxil , but we can see labs today and check platelet level Tolerating cefadroxil  otherwise  Advise to talk  to primary care about aspirin  use if further hematoma forms    10/14/23 id clinic assessment Patient got admitted 09/19/23 and underwent a left posterior thigh superficial fluid collection I&D presumed seroma. No culture sent I reviewed op note from dr Lonni Poli. Some necrotic tissue found but no purulence. He remains on cefadroxil  twice a day 2 tablet twice a day. As he is at least 6 months out of the initial infection and recent seroma description not appeared to be infectious, will go to 1 tablet twice a day (500 mg bid) Labs today  F/u 3 months  Given so many infection with staph aureus in the past in the same hip, will keep on cefadroxil  indefinitely likey 500 twice a day  Follow-up: Return in about 3 months (around 01/12/2024).      Constance ONEIDA Passer, MD [facility] 10/14/2023, 2:10 PM

## 2023-10-14 NOTE — Patient Instructions (Signed)
 We can go down to 500 mg twice a day (1 tablet twice day)  Labs today   See me in 3 months again

## 2023-10-15 DIAGNOSIS — M50322 Other cervical disc degeneration at C5-C6 level: Secondary | ICD-10-CM | POA: Diagnosis not present

## 2023-10-15 DIAGNOSIS — M9901 Segmental and somatic dysfunction of cervical region: Secondary | ICD-10-CM | POA: Diagnosis not present

## 2023-10-15 LAB — CBC
HCT: 40.4 % (ref 38.5–50.0)
Hemoglobin: 12.6 g/dL — ABNORMAL LOW (ref 13.2–17.1)
MCH: 26.7 pg — ABNORMAL LOW (ref 27.0–33.0)
MCHC: 31.2 g/dL — ABNORMAL LOW (ref 32.0–36.0)
MCV: 85.6 fL (ref 80.0–100.0)
MPV: 10 fL (ref 7.5–12.5)
Platelets: 287 10*3/uL (ref 140–400)
RBC: 4.72 10*6/uL (ref 4.20–5.80)
RDW: 13.9 % (ref 11.0–15.0)
WBC: 5.2 10*3/uL (ref 3.8–10.8)

## 2023-10-15 LAB — COMPLETE METABOLIC PANEL WITH GFR
AG Ratio: 1.2 (calc) (ref 1.0–2.5)
ALT: 15 U/L (ref 9–46)
AST: 17 U/L (ref 10–35)
Albumin: 3.6 g/dL (ref 3.6–5.1)
Alkaline phosphatase (APISO): 104 U/L (ref 35–144)
BUN: 16 mg/dL (ref 7–25)
CO2: 27 mmol/L (ref 20–32)
Calcium: 9.9 mg/dL (ref 8.6–10.3)
Chloride: 107 mmol/L (ref 98–110)
Creat: 1.13 mg/dL (ref 0.70–1.28)
Globulin: 3 g/dL (ref 1.9–3.7)
Glucose, Bld: 100 mg/dL — ABNORMAL HIGH (ref 65–99)
Potassium: 4.6 mmol/L (ref 3.5–5.3)
Sodium: 139 mmol/L (ref 135–146)
Total Bilirubin: 0.3 mg/dL (ref 0.2–1.2)
Total Protein: 6.6 g/dL (ref 6.1–8.1)
eGFR: 67 mL/min/{1.73_m2} (ref >=60)

## 2023-10-15 LAB — C-REACTIVE PROTEIN: CRP: 22.1 mg/L — ABNORMAL HIGH (ref <8.0)

## 2023-10-16 DIAGNOSIS — M9901 Segmental and somatic dysfunction of cervical region: Secondary | ICD-10-CM | POA: Diagnosis not present

## 2023-10-16 DIAGNOSIS — M50322 Other cervical disc degeneration at C5-C6 level: Secondary | ICD-10-CM | POA: Diagnosis not present

## 2023-10-20 DIAGNOSIS — T8189XA Other complications of procedures, not elsewhere classified, initial encounter: Secondary | ICD-10-CM | POA: Diagnosis not present

## 2023-10-20 DIAGNOSIS — M50322 Other cervical disc degeneration at C5-C6 level: Secondary | ICD-10-CM | POA: Diagnosis not present

## 2023-10-20 DIAGNOSIS — M9901 Segmental and somatic dysfunction of cervical region: Secondary | ICD-10-CM | POA: Diagnosis not present

## 2023-10-20 DIAGNOSIS — S71102A Unspecified open wound, left thigh, initial encounter: Secondary | ICD-10-CM | POA: Diagnosis not present

## 2023-10-21 ENCOUNTER — Telehealth: Payer: Self-pay | Admitting: Orthopaedic Surgery

## 2023-10-21 NOTE — Telephone Encounter (Signed)
 LMOM for nurse of the below message from [facility]

## 2023-10-21 NOTE — Telephone Encounter (Signed)
 Received call from Suzen Brazier with [facility] stating there is no more depth to patient's wound. She advised the wound vac is no longer needed.    Suzen asked if the wound vac can be removed. Suzen advised also that she will need verbal orders for wound care after wound vac is removed. The number to contact Suzen is [phone]

## 2023-10-23 DIAGNOSIS — T8189XA Other complications of procedures, not elsewhere classified, initial encounter: Secondary | ICD-10-CM | POA: Diagnosis not present

## 2023-10-31 DIAGNOSIS — T8189XA Other complications of procedures, not elsewhere classified, initial encounter: Secondary | ICD-10-CM | POA: Diagnosis not present

## 2023-11-05 ENCOUNTER — Ambulatory Visit (INDEPENDENT_AMBULATORY_CARE_PROVIDER_SITE_OTHER): Payer: PPO | Admitting: Orthopaedic Surgery

## 2023-11-05 ENCOUNTER — Encounter: Payer: Self-pay | Admitting: Orthopaedic Surgery

## 2023-11-05 DIAGNOSIS — M1711 Unilateral primary osteoarthritis, right knee: Secondary | ICD-10-CM

## 2023-11-05 DIAGNOSIS — Z9889 Other specified postprocedural states: Secondary | ICD-10-CM

## 2023-11-05 DIAGNOSIS — T792XXD Traumatic secondary and recurrent hemorrhage and seroma, subsequent encounter: Secondary | ICD-10-CM

## 2023-11-05 MED ORDER — METHYLPREDNISOLONE ACETATE 40 MG/ML IJ SUSP
40.0000 mg | INTRAMUSCULAR | Status: AC | PRN
  Administered 2023-11-05: 40 mg via INTRA_ARTICULAR

## 2023-11-05 MED ORDER — LIDOCAINE HCL 1 % IJ SOLN
3.0000 mL | INTRAMUSCULAR | Status: AC | PRN
  Administered 2023-11-05: 3 mL

## 2023-11-05 NOTE — Progress Notes (Signed)
HPI: [name] returns today for follow-up of his left thigh.  He states overall the left thigh is doing well.  He underwent an I&D on 09/19/2023.  Home health continues to monitor his wound.  He continues to have some drainage.  But denies any fevers or chills.  He is asking for right knee injection as he has known osteoarthritis.  No known injury to the right knee.  Review of systems see HPI otherwise negative  Physical exam: Right knee good range of motion no abnormal warmth erythema or effusion Left thigh: Small granular wound.  No abnormal warmth erythema.  No significant fluid accumulation.  Impression: Status post I&D left thigh Right knee osteoarthritis   Plan: He will continue current treatment for his left thigh wound.  Will see him back in 4 weeks see how this is trending.  He will return sooner if there is any questions concerns.  Regards to his right knee we did provide him with a cortisone injection today.  Questions were encouraged and answered by Dr. Magnus Ivan and myself.      Procedure Note  Patient: [name]             Date of Birth: [birth year]           MRN: [id number]             Visit Date: 11/05/2023  Procedures: Visit Diagnoses:  1. Unilateral primary osteoarthritis, right knee     Large Joint Inj: R knee on 11/05/2023 11:57 AM Indications: pain Details: 22 G 1.5 in needle, anterolateral approach  Arthrogram: No  Medications: 3 mL lidocaine 1 %; 40 mg methylPREDNISolone acetate 40 MG/ML Outcome: tolerated well, no immediate complications Procedure, treatment alternatives, risks and benefits explained, specific risks discussed. Consent was given by the patient. Immediately prior to procedure a time out was called to verify the correct patient, procedure, equipment, support staff and site/side marked as required. Patient was prepped and draped in the usual sterile fashion.

## 2023-11-11 DIAGNOSIS — T8189XA Other complications of procedures, not elsewhere classified, initial encounter: Secondary | ICD-10-CM | POA: Diagnosis not present

## 2023-11-28 ENCOUNTER — Telehealth: Payer: Self-pay | Admitting: Orthopaedic Surgery

## 2023-11-28 ENCOUNTER — Telehealth: Payer: Self-pay

## 2023-11-28 NOTE — Telephone Encounter (Signed)
[name] with [name] called stating she went out to see the patient and he had some redness and area was warm to the touch about the incision on his hip.  Patient has no fevers and no other symptoms at this time.  Per [name] she advised the patient if fevers or redness of the area increases he needs to go to the ED.  Please advise.  [name] [name], CMA

## 2023-11-28 NOTE — Telephone Encounter (Signed)
Cala Bradford with [facility] home [facility] called on pt behalf to report LT hip incision had to be cleaned out 3x, incision is also red, tender and very warm. No fever reported. Best call back for [facility]  [phone]. They are requesting to be seen sooner if possible

## 2023-12-01 ENCOUNTER — Encounter: Payer: Self-pay | Admitting: Orthopaedic Surgery

## 2023-12-01 ENCOUNTER — Telehealth: Payer: Self-pay | Admitting: Internal Medicine

## 2023-12-01 ENCOUNTER — Ambulatory Visit (INDEPENDENT_AMBULATORY_CARE_PROVIDER_SITE_OTHER): Payer: PPO | Admitting: Orthopaedic Surgery

## 2023-12-01 DIAGNOSIS — M009 Pyogenic arthritis, unspecified: Secondary | ICD-10-CM

## 2023-12-01 MED ORDER — CEFADROXIL 500 MG PO CAPS: ORAL_CAPSULE | ORAL | 3 refills | Status: DC

## 2023-12-01 NOTE — Telephone Encounter (Signed)
 His picture doesn't look good   I hope he doesn't need surgery  We are increasing dose to 1000 mg bid until he seese again, ideally not less than 6 weeks from now   I worry of deeper infection now breaking through   We will see  Please have him come back with any provider asap  Thanks

## 2023-12-01 NOTE — Telephone Encounter (Signed)
 Patient coming in this afternoon

## 2023-12-01 NOTE — Telephone Encounter (Signed)
 Patient advised of dose increase of cefadroxil.  Patient will see Dr. Renold Don on 12/04/23

## 2023-12-01 NOTE — Progress Notes (Signed)
 The patient is worked in today to see his left hip anterior incision.  He has chronic infection with a right total hip arthroplasty that is being suppressed with antibiotics.  Over the last several months we have been dealing with a posterior wound that is finally healing.  About every 2 years we have had to do open the anterior wound and irrigated out.  He started develop some redness last week.  He denies any fever and chills.  He did see infectious disease specialist today who he reports is making adjustment to his antibiotics for the next 2 weeks.  Examination of his anterior incision shows no open wound but there is some redness to it.  I did clean it well with alcohol swabs and placed a 18-gauge needle in the wound and cannot really express any fluid from the area.  However he understands that this is still the ramifications of a chronic infection.  Since it looks stable right now and he is just started a different antibiotic regimen, I would like to see him back a week from today for further assessment.  If things worsen he knows that we will recommend an open I&D.

## 2023-12-01 NOTE — Telephone Encounter (Signed)
 Spoke to patient - patient will take a picture at [facility] office today and will send to Dr.Vu cell.   Patient aware of medication instructions. RX sent to [facility] on [address]   Per Dr.Vu - yes please. do cefadroxil 500 mg po bid 90 day supply with 3 refills; he can take 1000 mg po bid for the next 2 weeks and see someone in id clinic afterward    Will also route to front desk to get patient scheduled for follow up in 2 weeks.   Kampbell Holaway Lesli Albee, CMA

## 2023-12-01 NOTE — Addendum Note (Signed)
 Addended by: Marcell Anger on: 12/01/2023 10:28 AM   Modules accepted: Orders

## 2023-12-01 NOTE — Telephone Encounter (Signed)
 [name] left a voicemail stating he needs an urgent appointment with Dr. Renold Don today due to an infection in his leg. At the time of this message, there are no 30 minute openings this week or next with another provider. Patient can be reached at [phone].

## 2023-12-01 NOTE — Telephone Encounter (Signed)
 See note from Dr Magnus Ivan

## 2023-12-03 ENCOUNTER — Encounter: Payer: PPO | Admitting: Orthopaedic Surgery

## 2023-12-04 ENCOUNTER — Other Ambulatory Visit: Payer: Self-pay

## 2023-12-04 ENCOUNTER — Encounter: Payer: Self-pay | Admitting: Internal Medicine

## 2023-12-04 ENCOUNTER — Ambulatory Visit: Payer: PPO | Admitting: Internal Medicine

## 2023-12-04 VITALS — BP 151/78 | HR 67 | Temp 97.5°F | Ht 73.0 in | Wt 257.0 lb

## 2023-12-04 DIAGNOSIS — T8459XD Infection and inflammatory reaction due to other internal joint prosthesis, subsequent encounter: Secondary | ICD-10-CM | POA: Diagnosis not present

## 2023-12-04 DIAGNOSIS — Z96649 Presence of unspecified artificial hip joint: Secondary | ICD-10-CM

## 2023-12-04 DIAGNOSIS — T8452XD Infection and inflammatory reaction due to internal left hip prosthesis, subsequent encounter: Secondary | ICD-10-CM | POA: Diagnosis not present

## 2023-12-04 NOTE — Progress Notes (Signed)
 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   Pain in left hip 09/01/2017   History of revision of total replacement of left hip joint 09/01/2017   Encounter for surveillance of recalled total hip arthroplasty hardware (HCC) 06/07/2016   Status post revision of total hip replacement 06/07/2016   Spinal stenosis, lumbar region, 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   Red blood cell abnormality 02/11/2016   Elevated blood pressure reading without diagnosis of hypertension 07/01/2013   Peripheral neuropathy 06/02/2013   Health maintenance examination 11/26/2011   Prostate cancer (HCC) 05/21/2010   Obesity, Class I, BMI 30-34.9 05/18/2010   Venous (peripheral) insufficiency 05/18/2010   Osteoarthrosis involving lower leg 05/18/2010      Subjective:    Patient ID: [name], male    DOB: 1945-11-15, 78 y.o.   MRN: [id number]  Chief Complaint  Patient presents with   Follow-up    HPI:  [name] is a 78 y.o. 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 Here for id clinic f/u   01/16/23 id f/u 01/03/23 operative culture mssa Patient doing  well Hip incision has one little spot still not closed yet. All sutures intact. There is a lot of serosanguinous drainage and he had to change pad 2-3 times a day depending on activity No pain along the incision or in the groin.  He'll see dr Magnus Ivan today  He is taking also the cefadroxil together with cefazolin which he didn't know he not supposed to   02/21/23 id clinic f/u Doing well on cephadroxil on 02/15/23; picc removed No hip pain Opat labs normalized crp The incision had fully closed as of 02/20/23  12/04/23 id clinic f/u See a&p   Allergies  Allergen Reactions   Adhesive [Tape] Rash    Paper tape is ok      Outpatient Medications Prior to Visit  Medication Sig Dispense Refill   acetaminophen (TYLENOL) 500 MG tablet Take 1,000 mg by mouth 2 (two) times daily as needed for moderate pain or headache.      aspirin EC 81 MG tablet Take 81 mg by mouth daily.     atorvastatin (LIPITOR) 20 MG tablet Take 1 tablet (20 mg total) by mouth daily. (Patient taking differently: Take 20 mg by mouth at bedtime.) 90 tablet 3   Calcium Carb-Cholecalciferol (CALCIUM + D3 PO) Take 1 tablet by mouth daily.     cefadroxil (DURICEF) 500 MG capsule Take 2 capsules (1000 MG total) by mouth 2 (two) times daily for 2 weeks. Then Take 1 tablet (500 MG total) by mouth 2 (two) times daily. 180 capsule 3   HYDROcodone-acetaminophen (NORCO) 7.5-325 MG tablet Take 1-2 tablets by mouth 3 (three) times daily as needed  for severe pain (pain score 7-10). (Patient not taking: Reported on 12/04/2023) 30 tablet 0   solifenacin (VESICARE) 5 MG tablet Take 5 mg by mouth daily. (Patient not taking: Reported on 12/04/2023)     No facility-administered medications prior to visit.     Social History   Socioeconomic History   Marital status: Married    Spouse name: Not on file   Number of children: Not on file   Years of education: Not on file   Highest education level: Not on file  Occupational History   Not  on file  Tobacco Use   Smoking status: Never   Smokeless tobacco: Never  Vaping Use   Vaping status: Never Used  Substance and Sexual Activity   Alcohol use: No   Drug use: No   Sexual activity: Not Currently  Other Topics Concern   Not on file  Social History Narrative   Married, 2 biologic children, 2 adopted, 5 GC.   Lives in [place].  Worked 35 yrs at [employer].   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   No formal exercise.  No T/A/Ds.   Enjoys woodworking.   Social Drivers of Corporate investment banker Strain: Low Risk  (03/26/2023)   Overall Financial Resource Strain (CARDIA)    Difficulty of Paying Living Expenses: Not hard at all  Food Insecurity: No Food Insecurity (09/24/2023)   Hunger Vital Sign    Worried About Running Out of Food in the Last Year: Never true    Ran Out of Food in the Last Year: Never true  Transportation Needs: No Transportation Needs (09/24/2023)   PRAPARE - Administrator, Civil Service (Medical): No    Lack of Transportation (Non-Medical): No  Physical Activity: Sufficiently Active (03/26/2023)   Exercise Vital Sign    Days of Exercise per Week: 7 days    Minutes of Exercise per Session: 30 min  Stress: No Stress Concern Present (03/26/2023)   [employer] of Occupational Health - Occupational Stress Questionnaire    Feeling of Stress : Not at all  Social Connections: Socially Integrated (03/26/2023)   Social Connection and Isolation Panel [NHANES]    Frequency of Communication with Friends and Family: More than three times a week    Frequency of Social Gatherings with Friends and Family: More than three times a week    Attends Religious Services: More than 4 times per year    Active Member of [employer] or Organizations: Yes    Attends Banker Meetings: 1 to 4 times per year    Marital Status: Married  Catering manager Violence: Not At Risk (09/24/2023)   Humiliation, Afraid, Rape, and Kick  questionnaire    Fear of Current or Ex-Partner: No    Emotionally Abused: No    Physically Abused: No    Sexually Abused: No      Review of Systems  Eyes:  Negative for visual disturbance.      All other ros negative   Objective:    BP (!) 151/78   Pulse 67   Temp (!) 97.5 F (36.4 C) (Oral)   Ht 6\' 1"  (1.854 m)   Wt 257 lb (116.6 kg)   SpO2 98%   BMI 33.91 kg/m  Nursing note and vital signs reviewed.  Physical Exam     General/constitutional: no distress, pleasant HEENT: Normocephalic, PER, Conj Clear, EOMI, Oropharynx clear Neck supple CV: rrr no mrg Lungs: clear to  auscultation, normal respiratory effort Abd: Soft, Nontender Ext: no edema Neuro: nonfocal MSK: left hip wound thigh where the previous hematoma was drained dressing clean/dry  Skin:     Labs: Lab Results  Component Value Date   WBC 5.2 10/14/2023   HGB 12.6 (L) 10/14/2023   HCT 40.4 10/14/2023   MCV 85.6 10/14/2023   PLT 287 10/14/2023   Last metabolic panel Lab Results  Component Value Date   GLUCOSE 100 (H) 10/14/2023   NA 139 10/14/2023   K 4.6 10/14/2023   CL 107 10/14/2023   CO2 27 10/14/2023   BUN 16 10/14/2023   CREATININE 1.13 10/14/2023   GFRNONAA >60 09/23/2023   CALCIUM 9.9 10/14/2023   PHOS 2.5 05/21/2010   PROT 6.6 10/14/2023   ALBUMIN 3.4 (L) 08/07/2023   BILITOT 0.3 10/14/2023   ALKPHOS 71 08/07/2023   AST 17 10/14/2023   ALT 15 10/14/2023   ANIONGAP 7 09/17/2023     Micro:  Serology:  Imaging:  Assessment & Plan:   Problem List Items Addressed This Visit   None Visit Diagnoses       Infection of prosthetic hip joint, subsequent encounter    -  Primary   Relevant Orders   CBC with Differential/Platelet   COMPLETE METABOLIC PANEL WITH GFR   C-reactive protein         No orders of the defined types were placed in this encounter.      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 Here for id clinic f/u   He is followed by dr Orvan Falconer. Last seen 05/08/22 in clinic. Chart mention 03/2020 had a short course of rifampin combination cefazolin during the most recent I&D prior to this admission.    S/p I&D 01/03/23 this admission. Cx mssa again (grew in 2017, 2019, and 2021). 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 Hardware retained     I am not optimistic in curing this or suppressing this given the history/operative finding   No role for rifampin in his case   Had planned 6 weeks iv cefazolin then transitioned to indefinite suppression on 02/14/23.   ------------ 01/16/23 id assessment  #lab monitoring 4/08 opat labs cbc 5/14/213; cr 0.8; crp 19 (<10)  #prosthetic joint recurrent/chronic infection -- mssa S/p multiple surgery; last washout 01/03/23 with I&D and sinus tract removal -- no hardware exchange  -advise to stop cefadroxil for now while on cefazolin -once done with cefazolin on 02/14/23, can remove picc and start cefadroxil indefinitely  02/21/23 id assessment Labs and clinically looking very good Will continue cefadroxil 1000 mg twice a day for the next 6 months; after that if clinically looking good still will decrease to 500 mg twice a day indefinitely  F/u in 3 months  05/20/23 id assessment Doing well He thinks since he's been taking twice a day high dose cefadroxil he is more prone to bruising. He has a big hematoma on left buttock from just riding lawn mower He takes asa 81 mg once a day; no herbal supplement; no anticoagulant It would  be very unusual if related to cefadroxil, but we can see labs today and check platelet level Tolerating cefadroxil otherwise  Advise to talk to primary care about aspirin use if further hematoma forms    10/14/23 id clinic assessment Patient got admitted 09/19/23 and underwent a left posterior thigh superficial fluid collection I&D presumed seroma. No culture sent I reviewed op note from dr Doneen Poisson. Some necrotic tissue found but no purulence. He remains on cefadroxil twice a day 2 tablet twice a day. As he is at least 6 months out of the initial infection and recent seroma description not appeared to be infectious, will go to 1 tablet twice a day (500 mg bid) Labs today  F/u 3 months  Given so many infection with staph aureus in the past in the same hip, will keep on cefadroxil indefinitely likey 500 twice a day   12/04/23 id assessment Patient decreased dose of cefadroxil to 500 mg bid 10/14/23; he fell 2 weeks ago but didn't remember falling on the left hip. Within a day of the fall he started having redness/blistering at the left lateral hip incision No fever, chill We discussed over the phone and 3 days prior to this visit increased back to 1000 mg twice a day Patient saw dr Magnus Ivan in clinic 4 days ago who was trying to express any pus but nothing was obtained He'll see dr Magnus Ivan next week His redness and blister is improving. 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 3/27      Raymondo Band, MD [facility] 12/04/2023, 8:56 AM

## 2023-12-04 NOTE — Patient Instructions (Signed)
 Continue cefadroxil 1000 mg twice a day; I plan to do this dose for at least another 3-6 months   Will see what Dr Magnus Ivan want to do  See me 3/27

## 2023-12-05 LAB — COMPLETE METABOLIC PANEL WITH GFR
AG Ratio: 1.3 (calc) (ref 1.0–2.5)
ALT: 12 U/L (ref 9–46)
AST: 13 U/L (ref 10–35)
Albumin: 3.6 g/dL (ref 3.6–5.1)
Alkaline phosphatase (APISO): 94 U/L (ref 35–144)
BUN: 22 mg/dL (ref 7–25)
CO2: 24 mmol/L (ref 20–32)
Calcium: 10 mg/dL (ref 8.6–10.3)
Chloride: 106 mmol/L (ref 98–110)
Creat: 1.01 mg/dL (ref 0.70–1.28)
Globulin: 2.8 g/dL (ref 1.9–3.7)
Glucose, Bld: 95 mg/dL (ref 65–99)
Potassium: 4.6 mmol/L (ref 3.5–5.3)
Sodium: 139 mmol/L (ref 135–146)
Total Bilirubin: 0.4 mg/dL (ref 0.2–1.2)
Total Protein: 6.4 g/dL (ref 6.1–8.1)
eGFR: 76 mL/min/{1.73_m2} (ref >=60)

## 2023-12-05 LAB — CBC WITH DIFFERENTIAL/PLATELET
Absolute Lymphocytes: 1039 {cells}/uL (ref 850–3900)
Absolute Monocytes: 437 {cells}/uL (ref 200–950)
Basophils Absolute: 19 {cells}/uL (ref 0–200)
Basophils Relative: 0.4 %
Eosinophils Absolute: 108 {cells}/uL (ref 15–500)
Eosinophils Relative: 2.3 %
HCT: 44.3 % (ref 38.5–50.0)
Hemoglobin: 13.5 g/dL (ref 13.2–17.1)
MCH: 25.2 pg — ABNORMAL LOW (ref 27.0–33.0)
MCHC: 30.5 g/dL — ABNORMAL LOW (ref 32.0–36.0)
MCV: 82.8 fL (ref 80.0–100.0)
MPV: 9.9 fL (ref 7.5–12.5)
Monocytes Relative: 9.3 %
Neutro Abs: 3097 {cells}/uL (ref 1500–7800)
Neutrophils Relative %: 65.9 %
Platelets: 304 10*3/uL (ref 140–400)
RBC: 5.35 10*6/uL (ref 4.20–5.80)
RDW: 15.1 % — ABNORMAL HIGH (ref 11.0–15.0)
Total Lymphocyte: 22.1 %
WBC: 4.7 10*3/uL (ref 3.8–10.8)

## 2023-12-05 LAB — C-REACTIVE PROTEIN: CRP: 37.4 mg/L — ABNORMAL HIGH (ref <8.0)

## 2023-12-08 ENCOUNTER — Encounter: Payer: Self-pay | Admitting: Orthopaedic Surgery

## 2023-12-08 ENCOUNTER — Ambulatory Visit (INDEPENDENT_AMBULATORY_CARE_PROVIDER_SITE_OTHER): Payer: PPO | Admitting: Orthopaedic Surgery

## 2023-12-08 DIAGNOSIS — M009 Pyogenic arthritis, unspecified: Secondary | ICD-10-CM

## 2023-12-08 NOTE — Progress Notes (Signed)
 The patient continues to follow-up with a chronic infection involving the left prosthetic hip joint.  He said he is on a stronger dose of antibiotics per the infectious disease service.  He feels like the wound is getting better.  On my exam today there is less of a red hue but I do not think it is getting better from my standpoint.  He is not having any pain or discomfort and he is afebrile.  His heart is not racing.  He understands that I would prefer to take him to the operating room to wash this out again.  He feels like it is getting better and would like to wait a little bit longer.  I understand his concern and he does have issues in terms of taking care of her wife who has early dementia.  There is good family support though as well.  He understands if this worsens we need to take him to the operating room.  I will see him back in 2 weeks from today since I am off next week and we can reassess his wound.  If it does worsen next week he will let at least our office know.  I am operating on Friday, March 14 and can certainly add him on the schedule if needed.

## 2023-12-10 ENCOUNTER — Telehealth: Payer: Self-pay

## 2023-12-10 NOTE — Telephone Encounter (Signed)
 Patient left voice mail requesting to be added to surgery schedule on Friday for infected hip.  Please advise.

## 2023-12-10 NOTE — Telephone Encounter (Signed)
 I called patient and scheduled for 12/12/23.

## 2023-12-10 NOTE — Progress Notes (Signed)
 For Anesthesia: PCP - Jeoffrey Massed, MD  Cardiologist - N/A  Chest x-ray - N/A EKG - 01/02/23 in [facility] Stress Test - N/A ECHO - N/A Cardiac Cath - N/A Pacemaker/ICD device last checked: N/A Pacemaker orders received: N/A Device Rep notified: N/A  Spinal Cord Stimulator: N/A  Sleep Study - N/A CPAP - N/A  Fasting Blood Sugar - N/A Checks Blood Sugar ___N/A__ times a day Date and result of last Hgb A1c-N/A  Blood Thinner Instructions: N/A Aspirin Instructions: Aspirin 81 mg Last Dose: 12/10/2023  Activity level: Can go up a flight of stairs and activities of daily living without stopping and without chest pain and/or shortness of breath    Anesthesia review: N/A  Patient denies shortness of breath, fever, cough and chest pain during pre op phone call.   Patient verbalized understanding of instructions reviewed via telephone.

## 2023-12-11 ENCOUNTER — Other Ambulatory Visit: Payer: Self-pay

## 2023-12-11 ENCOUNTER — Encounter (HOSPITAL_COMMUNITY): Payer: Self-pay | Admitting: Orthopaedic Surgery

## 2023-12-11 NOTE — Anesthesia Preprocedure Evaluation (Addendum)
 Anesthesia Evaluation  Patient identified by MRN, date of birth, ID band Patient awake    Reviewed: Allergy & Precautions, NPO status , Patient's Chart, lab work & pertinent test results  History of Anesthesia Complications Negative for: history of anesthetic complications  Airway Mallampati: III  TM Distance: >3 FB Neck ROM: Full    Dental no notable dental hx. (+) Teeth Intact, Dental Advisory Given   Pulmonary neg pulmonary ROS   Pulmonary exam normal breath sounds clear to auscultation       Cardiovascular Exercise Tolerance: Good (-) hypertension(-) angina + Peripheral Vascular Disease  (-) Past MI Normal cardiovascular exam Rhythm:Regular Rate:Normal     Neuro/Psych  Neuromuscular disease  negative psych ROS   GI/Hepatic negative GI ROS, Neg liver ROS,,,  Endo/Other  Obesity   Renal/GU Renal InsufficiencyRenal diseaseLab Results      Component                Value               Date                         K                        4.6                 12/04/2023                   CREATININE               1.01                12/04/2023                     Musculoskeletal  (+) Arthritis ,  recurrent seroma left thigh   Abdominal   Peds  Hematology negative hematology ROS (+) Lab Results      Component                Value               Date                          HGB                      13.5                12/04/2023                HCT                      44.3                12/04/2023                 PLT                      304                 12/04/2023               Anesthesia Other Findings All: adhesive tape  Reproductive/Obstetrics  Anesthesia Physical Anesthesia Plan  ASA: 3  Anesthesia Plan: General   Post-op Pain Management: Ofirmev IV (intra-op)*   Induction: Intravenous  PONV Risk Score and Plan: 2 and Dexamethasone and  Ondansetron  Airway Management Planned: Oral ETT  Additional Equipment: None  Intra-op Plan:   Post-operative Plan: Extubation in OR  Informed Consent: I have reviewed the patients History and Physical, chart, labs and discussed the procedure including the risks, benefits and alternatives for the proposed anesthesia with the patient or authorized representative who has indicated his/her understanding and acceptance.     Dental advisory given  Plan Discussed with: CRNA and Surgeon  Anesthesia Plan Comments: (Risks of anesthesia explained at length. This includes, but is not limited to, sore throat, damage to teeth, lips gums, tongue and vocal cords, nausea and vomiting, reactions to medications, stroke, heart attack, and death. All patient questions were answered and the patient wishes to proceed.  )        Anesthesia Quick Evaluation

## 2023-12-11 NOTE — H&P (Signed)
 [name] is an 78 y.o. male.   Chief Complaint: Left hip pain and cellulitis HPI: The patient is a 78 year old gentleman well-known to [place].  He has a history of a chronic infection involving a left hip arthroplasty.  He has been on suppressive antibiotics for a long period of time.  Every so often he does end up having worsening of his left hip in terms of pain but also evidence of continued infection with redness around the incision and areas that look like the incision was opened up.  We have taken him to the operating room numerous times for an open irrigation and debridement of his left hip joint.  He remains reluctant to have the implant removed at this point given the detrimental effect it will have on his mobility and his quality of life.  He is 78 years old and continues to care for his wife who does have mild dementia.  He does have good family support.  Over the last few weeks he has developed worsening redness of his left hip incision.  He is followed closely by the infectious disease service who have increase his antibiotics.  We have recommended a repeat irrigation and debridement of the left hip joint.  Past Medical History:  Diagnosis Date   Anterior dislocation of right shoulder 09/2013   Reduced in ED under sedation.  Dislocated posteriorly 2015, ? partial RC tear, has f/u planned with Dr. August Saucer--? surgery? possible plan for 2016.   Blood donor, whole blood    Has donated approx 30 gallons in his lifetime   Chronic left hip pain    Severe DJD.  THA 2008.  Left acetabular revision in 2017 AND 2019.   Chronic pain syndrome    Candidate for spinal cord stimulator (03/2017)   Chronic renal insufficiency, stage II (mild)    Chronic venous insufficiency    +compression hose   DDD (degenerative disc disease), lumbar 2017   Dr. Joette Catching ortho.  He is now wearing a new leg brace.  Has had ESI and will get another.   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  Xarelto x 52mo at full dosing, then 6 more months at 1/2 dosing, then stopped xarelto.     Foot drop, left    s/p hip fracture 1969   History of blood transfusion    Hx of adenomatous colonic polyps    polypectomy 2007; 2012; 2015; 05/2019; recall 65yrs   Hypercholesterolemia 02/2017   Recommended statin 02/10/17 but pt declined, wanted to do TLC trial first. Rec'd statin 04/2020   IFG (impaired fasting glucose)    04/2023 fasting 111, a1c 5.7%   Neuropathy    Osteoarthritis    knees and hips primarily   Phlebitis    Post laminectomy syndrome    improved with spinal cord stim-->stim to be removed summer/fall 2019.   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 PSA 0.16 Feb 2020 urol f/u. [name] Aug 2021 urol. <0.015 02/2021.   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; 02/2020   MSSA; admitted for I&D 02/11/20->plan for longterm IV abx after   Urge incontinence    vesicare helpful    Past Surgical History:  Procedure Laterality Date   ANTERIOR HIP REVISION Left 06/07/2016   Procedure: LEFT ANTERIOR HIP  ACETABULAR REVISION;  Surgeon: Kathryne Hitch, MD;  Location: [facility];  Service: Orthopedics;  Laterality: Left;   ANTERIOR HIP REVISION Left 10/09/2017   Procedure: Left hip acetabular revision;  Surgeon: Kathryne Hitch, MD;  Location: [facility];  Service: Orthopedics;  Laterality: Left;   bilateral carpal tunnel release  2003   COLONOSCOPY  2007;2012;2015;05/2019   04/12/2014 Tubular adenoma x 1; 05/2019 adenomatous polyp; recall 5 yrs.   FRACTURE SURGERY Left 1969   hip, traction hospitalized for 6 weeks   INCISION AND DRAINAGE Left 08/15/2023   Procedure: INCISION AND DRAINAGE OF SEROMA LEFT THIGH;  Surgeon: Kathryne Hitch, MD;  Location: [facility];  Service:  Orthopedics;  Laterality: Left;   INCISION AND DRAINAGE Left 09/19/2023   Procedure: INCISION AND DRAINAGE LEFT THIGH WITH WOUND VAC PLACEMENT;  Surgeon: Kathryne Hitch, MD;  Location: [facility];  Service: Orthopedics;  Laterality: Left;   INCISION AND DRAINAGE HIP Left 02/11/2020   Procedure: IRRIGATION AND DEBRIDEMENT LEFT HIP WITH ANTIBIOTIC SPACER PLACEMENT;  Surgeon: Kathryne Hitch, MD;  Location: [facility];  Service: Orthopedics;  Laterality: Left;   INCISION AND DRAINAGE HIP Left 01/03/2023   Procedure: IRRIGATION AND DEBRIDEMENT LEFT HIP;  Surgeon: Kathryne Hitch, MD;  Location: [facility];  Service: Orthopedics;  Laterality: Left;   LUMBAR LAMINECTOMY/DECOMPRESSION MICRODISCECTOMY Left 03/28/2016   Patient denies: Procedure: Left Lumbar three-four Laminoforaminotomy;  Surgeon: Maeola Harman, MD;  Location: [facility];  Service: Neurosurgery;  Laterality: Left;  left   POLYPECTOMY     RADIOACTIVE SEED IMPLANT  2012   ROTATOR CUFF REPAIR  2006   right   SHOULDER ARTHROSCOPY Left    SPINAL CORD STIMULATOR IMPLANT  06/2017   removed a year after was putted in.   SPINAL CORD STIMULATOR REMOVAL     TOTAL HIP ARTHROPLASTY  2008   left (cobalt chrome)   VASECTOMY      Family History  Problem Relation Age of Onset   Lung cancer Mother    Heart disease Father    Colon cancer Neg Hx    Esophageal cancer Neg Hx    Rectal cancer Neg Hx    Stomach cancer Neg Hx    Colon polyps Neg Hx    Social History:  reports that he has never smoked. He has never used smokeless tobacco. He reports that he does not drink alcohol and does not use drugs.  Allergies:  Allergies  Allergen Reactions   Adhesive [Tape] Rash    Paper tape is ok    No medications prior to admission.    No results found for this or any previous visit (from the past 48 hours). No results found.  Review of Systems  Height 6\' 1"  (1.854 m), weight 113.4 kg. Physical Exam Vitals reviewed.   Constitutional:      Appearance: Normal appearance.  HENT:     Head: Normocephalic and atraumatic.  Eyes:     Extraocular Movements: Extraocular movements intact.     Pupils: Pupils are equal, round, and reactive to light.  Cardiovascular:     Rate and Rhythm: Normal rate.     Pulses: Normal pulses.  Pulmonary:     Effort: Pulmonary effort is normal.  Abdominal:     Palpations: Abdomen is soft.  Musculoskeletal:     Cervical back: Normal range of motion and neck supple.     Left hip: Tenderness present. Decreased strength.  Neurological:     Mental Status: He is  alert and oriented to person, place, and time.  Psychiatric:        Behavior: Behavior normal.   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  He understands that this will not eradicate his infection and will continue to temporize the infection in terms of his symptoms.  Kathryne Hitch, MD 12/11/2023, 5:08 PM

## 2023-12-12 ENCOUNTER — Other Ambulatory Visit: Payer: Self-pay

## 2023-12-12 ENCOUNTER — Inpatient Hospital Stay (HOSPITAL_COMMUNITY): Payer: Self-pay | Admitting: Anesthesiology

## 2023-12-12 ENCOUNTER — Encounter (HOSPITAL_COMMUNITY): Payer: Self-pay | Admitting: Orthopaedic Surgery

## 2023-12-12 ENCOUNTER — Inpatient Hospital Stay (HOSPITAL_COMMUNITY)
Admission: RE | Admit: 2023-12-12 | Discharge: 2023-12-15 | DRG: 464 | Disposition: A | Discharge status: Home Health | Attending: Orthopaedic Surgery | Admitting: Orthopaedic Surgery

## 2023-12-12 ENCOUNTER — Encounter (HOSPITAL_COMMUNITY)
Admission: RE | Disposition: A | Discharge status: Home Health | Payer: Self-pay | Source: Home / Self Care | Attending: Orthopaedic Surgery

## 2023-12-12 DIAGNOSIS — Z860101 Personal history of adenomatous and serrated colon polyps: Secondary | ICD-10-CM | POA: Diagnosis not present

## 2023-12-12 DIAGNOSIS — Z9109 Other allergy status, other than to drugs and biological substances: Secondary | ICD-10-CM

## 2023-12-12 DIAGNOSIS — E7800 Pure hypercholesterolemia, unspecified: Secondary | ICD-10-CM

## 2023-12-12 DIAGNOSIS — L03116 Cellulitis of left lower limb: Secondary | ICD-10-CM | POA: Diagnosis present

## 2023-12-12 DIAGNOSIS — T8452XA Infection and inflammatory reaction due to internal left hip prosthesis, initial encounter: Principal | ICD-10-CM | POA: Diagnosis present

## 2023-12-12 DIAGNOSIS — Z792 Long term (current) use of antibiotics: Secondary | ICD-10-CM

## 2023-12-12 DIAGNOSIS — T84621A Infection and inflammatory reaction due to internal fixation device of left femur, initial encounter: Secondary | ICD-10-CM | POA: Diagnosis not present

## 2023-12-12 DIAGNOSIS — N182 Chronic kidney disease, stage 2 (mild): Secondary | ICD-10-CM | POA: Diagnosis not present

## 2023-12-12 DIAGNOSIS — T8452XD Infection and inflammatory reaction due to internal left hip prosthesis, subsequent encounter: Secondary | ICD-10-CM

## 2023-12-12 DIAGNOSIS — M71052 Abscess of bursa, left hip: Secondary | ICD-10-CM

## 2023-12-12 DIAGNOSIS — M160 Bilateral primary osteoarthritis of hip: Secondary | ICD-10-CM | POA: Diagnosis present

## 2023-12-12 DIAGNOSIS — I872 Venous insufficiency (chronic) (peripheral): Secondary | ICD-10-CM | POA: Diagnosis present

## 2023-12-12 DIAGNOSIS — Z8546 Personal history of malignant neoplasm of prostate: Secondary | ICD-10-CM

## 2023-12-12 DIAGNOSIS — G894 Chronic pain syndrome: Secondary | ICD-10-CM | POA: Diagnosis not present

## 2023-12-12 DIAGNOSIS — Z8672 Personal history of thrombophlebitis: Secondary | ICD-10-CM

## 2023-12-12 DIAGNOSIS — M170 Bilateral primary osteoarthritis of knee: Secondary | ICD-10-CM | POA: Diagnosis not present

## 2023-12-12 DIAGNOSIS — Z8249 Family history of ischemic heart disease and other diseases of the circulatory system: Secondary | ICD-10-CM | POA: Diagnosis not present

## 2023-12-12 DIAGNOSIS — I96 Gangrene, not elsewhere classified: Secondary | ICD-10-CM | POA: Diagnosis not present

## 2023-12-12 DIAGNOSIS — M25552 Pain in left hip: Secondary | ICD-10-CM | POA: Diagnosis present

## 2023-12-12 DIAGNOSIS — Z8781 Personal history of (healed) traumatic fracture: Secondary | ICD-10-CM

## 2023-12-12 DIAGNOSIS — M009 Pyogenic arthritis, unspecified: Secondary | ICD-10-CM | POA: Diagnosis present

## 2023-12-12 DIAGNOSIS — Y831 Surgical operation with implant of artificial internal device as the cause of abnormal reaction of the patient, or of later complication, without mention of misadventure at the time of the procedure: Secondary | ICD-10-CM | POA: Diagnosis present

## 2023-12-12 HISTORY — DX: Polyneuropathy, unspecified: G62.9

## 2023-12-12 HISTORY — DX: Personal history of other medical treatment: Z92.89

## 2023-12-12 HISTORY — PX: INCISION AND DRAINAGE HIP: SHX1801

## 2023-12-12 HISTORY — DX: Phlebitis and thrombophlebitis of unspecified site: I80.9

## 2023-12-12 LAB — CBC
HCT: 38.8 % — ABNORMAL LOW (ref 39.0–52.0)
Hemoglobin: 11.8 g/dL — ABNORMAL LOW (ref 13.0–17.0)
MCH: 25.4 pg — ABNORMAL LOW (ref 26.0–34.0)
MCHC: 30.4 g/dL (ref 30.0–36.0)
MCV: 83.6 fL (ref 80.0–100.0)
Platelets: 226 10*3/uL (ref 150–400)
RBC: 4.64 MIL/uL (ref 4.22–5.81)
RDW: 17 % — ABNORMAL HIGH (ref 11.5–15.5)
WBC: 5 10*3/uL (ref 4.0–10.5)
nRBC: 0 % (ref 0.0–0.2)

## 2023-12-12 LAB — BASIC METABOLIC PANEL
Anion gap: 6 (ref 5–15)
BUN: 19 mg/dL (ref 8–23)
CO2: 22 mmol/L (ref 22–32)
Calcium: 8.6 mg/dL — ABNORMAL LOW (ref 8.9–10.3)
Chloride: 103 mmol/L (ref 98–111)
Creatinine, Ser: 0.97 mg/dL (ref 0.61–1.24)
GFR, Estimated: >60 mL/min (ref >60)
Glucose, Bld: 127 mg/dL — ABNORMAL HIGH (ref 70–99)
Potassium: 4.6 mmol/L (ref 3.5–5.1)
Sodium: 131 mmol/L — ABNORMAL LOW (ref 135–145)

## 2023-12-12 SURGERY — IRRIGATION AND DEBRIDEMENT HIP
Anesthesia: General | Site: Hip | Laterality: Left

## 2023-12-12 MED ORDER — DIPHENHYDRAMINE HCL 12.5 MG/5ML PO ELIX: 12.5000 mg | ORAL_SOLUTION | ORAL | Status: DC | PRN

## 2023-12-12 MED ORDER — TRANEXAMIC ACID-NACL 1000-0.7 MG/100ML-% IV SOLN
INTRAVENOUS | Status: AC
  Filled 2023-12-12: qty 100

## 2023-12-12 MED ORDER — ONDANSETRON HCL 4 MG/2ML IJ SOLN: 4.0000 mg | Freq: Once | INTRAMUSCULAR | Status: DC | PRN

## 2023-12-12 MED ORDER — CEFADROXIL 500 MG PO CAPS
1000.0000 mg | ORAL_CAPSULE | Freq: Two times a day (BID) | ORAL | Status: DC
  Administered 2023-12-12 – 2023-12-13 (×2): 1000 mg via ORAL
  Filled 2023-12-12 (×2): qty 2

## 2023-12-12 MED ORDER — OXYCODONE HCL 5 MG PO TABS
5.0000 mg | ORAL_TABLET | ORAL | Status: DC | PRN
Start: 2023-12-12 — End: 2023-12-15
  Administered 2023-12-12 – 2023-12-13 (×3): 10 mg via ORAL
  Administered 2023-12-14: 5 mg via ORAL
  Filled 2023-12-12 (×5): qty 2

## 2023-12-12 MED ORDER — VANCOMYCIN HCL 1000 MG IV SOLR
INTRAVENOUS | Status: DC | PRN
  Administered 2023-12-12: 1000 mg via TOPICAL

## 2023-12-12 MED ORDER — LIDOCAINE HCL (PF) 2 % IJ SOLN
INTRAMUSCULAR | Status: AC
  Filled 2023-12-12: qty 5

## 2023-12-12 MED ORDER — HYDROMORPHONE HCL 1 MG/ML IJ SOLN
INTRAMUSCULAR | Status: DC | PRN
  Administered 2023-12-12 (×2): 1 mg via INTRAVENOUS

## 2023-12-12 MED ORDER — FESOTERODINE FUMARATE ER 4 MG PO TB24
4.0000 mg | ORAL_TABLET | Freq: Every day | ORAL | Status: DC
  Administered 2023-12-13 – 2023-12-15 (×3): 4 mg via ORAL
  Filled 2023-12-12 (×3): qty 1

## 2023-12-12 MED ORDER — ROCURONIUM BROMIDE 10 MG/ML (PF) SYRINGE
PREFILLED_SYRINGE | INTRAVENOUS | Status: AC
  Filled 2023-12-12: qty 10

## 2023-12-12 MED ORDER — FENTANYL CITRATE (PF) 100 MCG/2ML IJ SOLN
INTRAMUSCULAR | Status: DC | PRN
  Administered 2023-12-12: 25 ug via INTRAVENOUS
  Administered 2023-12-12: 75 ug via INTRAVENOUS

## 2023-12-12 MED ORDER — FENTANYL CITRATE (PF) 100 MCG/2ML IJ SOLN
INTRAMUSCULAR | Status: AC
  Filled 2023-12-12: qty 2

## 2023-12-12 MED ORDER — ACETAMINOPHEN 10 MG/ML IV SOLN
INTRAVENOUS | Status: AC
  Filled 2023-12-12: qty 100

## 2023-12-12 MED ORDER — LIDOCAINE HCL (PF) 2 % IJ SOLN
INTRAMUSCULAR | Status: DC | PRN
  Administered 2023-12-12: 100 mg via INTRADERMAL

## 2023-12-12 MED ORDER — SODIUM CHLORIDE 0.9 % IV SOLN: INTRAVENOUS | Status: DC

## 2023-12-12 MED ORDER — METOCLOPRAMIDE HCL 5 MG/ML IJ SOLN: 5.0000 mg | Freq: Three times a day (TID) | INTRAMUSCULAR | Status: DC | PRN

## 2023-12-12 MED ORDER — ACETAMINOPHEN 10 MG/ML IV SOLN: 1000.0000 mg | Freq: Once | INTRAVENOUS | Status: DC | PRN

## 2023-12-12 MED ORDER — IRRISEPT - 450ML BOTTLE WITH 0.05% CHG IN STERILE WATER, USP 99.95% OPTIME
TOPICAL | Status: DC | PRN
Start: 2023-12-12 — End: 2023-12-12
  Administered 2023-12-12: 450 mL

## 2023-12-12 MED ORDER — ONDANSETRON HCL 4 MG/2ML IJ SOLN: 4.0000 mg | Freq: Four times a day (QID) | INTRAMUSCULAR | Status: DC | PRN

## 2023-12-12 MED ORDER — ORAL CARE MOUTH RINSE: 15.0000 mL | Freq: Once | OROMUCOSAL | Status: AC

## 2023-12-12 MED ORDER — ROCURONIUM BROMIDE 10 MG/ML (PF) SYRINGE
PREFILLED_SYRINGE | INTRAVENOUS | Status: DC | PRN
  Administered 2023-12-12: 60 mg via INTRAVENOUS

## 2023-12-12 MED ORDER — EPHEDRINE 5 MG/ML INJ
INTRAVENOUS | Status: AC
  Filled 2023-12-12: qty 5

## 2023-12-12 MED ORDER — FENTANYL CITRATE PF 50 MCG/ML IJ SOSY: 25.0000 ug | PREFILLED_SYRINGE | INTRAMUSCULAR | Status: DC | PRN

## 2023-12-12 MED ORDER — VANCOMYCIN HCL 1000 MG IV SOLR
INTRAVENOUS | Status: DC | PRN
  Administered 2023-12-12: 1000 mg via INTRAVENOUS

## 2023-12-12 MED ORDER — LACTATED RINGERS IV SOLN: INTRAVENOUS | Status: DC

## 2023-12-12 MED ORDER — OXYCODONE HCL 5 MG PO TABS
10.0000 mg | ORAL_TABLET | ORAL | Status: DC | PRN
  Administered 2023-12-15: 10 mg via ORAL

## 2023-12-12 MED ORDER — HYDROMORPHONE HCL 2 MG/ML IJ SOLN
INTRAMUSCULAR | Status: AC
  Filled 2023-12-12: qty 1

## 2023-12-12 MED ORDER — VANCOMYCIN HCL IN DEXTROSE 1-5 GM/200ML-% IV SOLN
1000.0000 mg | Freq: Two times a day (BID) | INTRAVENOUS | Status: DC
  Administered 2023-12-12 – 2023-12-14 (×5): 1000 mg via INTRAVENOUS
  Filled 2023-12-12 (×7): qty 200

## 2023-12-12 MED ORDER — DOCUSATE SODIUM 100 MG PO CAPS
100.0000 mg | ORAL_CAPSULE | Freq: Two times a day (BID) | ORAL | Status: DC
  Administered 2023-12-12 – 2023-12-15 (×6): 100 mg via ORAL
  Filled 2023-12-12 (×6): qty 1

## 2023-12-12 MED ORDER — DEXAMETHASONE SODIUM PHOSPHATE 10 MG/ML IJ SOLN
INTRAMUSCULAR | Status: DC | PRN
  Administered 2023-12-12: 8 mg via INTRAVENOUS

## 2023-12-12 MED ORDER — SUGAMMADEX SODIUM 200 MG/2ML IV SOLN
INTRAVENOUS | Status: DC | PRN
  Administered 2023-12-12: 180 mg via INTRAVENOUS

## 2023-12-12 MED ORDER — METHOCARBAMOL 500 MG PO TABS
500.0000 mg | ORAL_TABLET | Freq: Four times a day (QID) | ORAL | Status: DC | PRN
  Filled 2023-12-12: qty 1

## 2023-12-12 MED ORDER — HYDROMORPHONE HCL 1 MG/ML IJ SOLN
0.5000 mg | INTRAMUSCULAR | Status: DC | PRN
Start: 2023-12-12 — End: 2023-12-15
  Filled 2023-12-12: qty 1

## 2023-12-12 MED ORDER — CEFAZOLIN SODIUM-DEXTROSE 2-4 GM/100ML-% IV SOLN
2.0000 g | INTRAVENOUS | Status: AC
  Administered 2023-12-12: 2 g via INTRAVENOUS
  Filled 2023-12-12: qty 100

## 2023-12-12 MED ORDER — PROPOFOL 10 MG/ML IV BOLUS
INTRAVENOUS | Status: DC | PRN
  Administered 2023-12-12: 150 mg via INTRAVENOUS

## 2023-12-12 MED ORDER — ONDANSETRON HCL 4 MG/2ML IJ SOLN
INTRAMUSCULAR | Status: DC | PRN
  Administered 2023-12-12: 4 mg via INTRAVENOUS

## 2023-12-12 MED ORDER — DEXAMETHASONE SODIUM PHOSPHATE 10 MG/ML IJ SOLN
INTRAMUSCULAR | Status: AC
  Filled 2023-12-12: qty 1

## 2023-12-12 MED ORDER — EPHEDRINE SULFATE-NACL 50-0.9 MG/10ML-% IV SOSY
PREFILLED_SYRINGE | INTRAVENOUS | Status: DC | PRN
  Administered 2023-12-12 (×2): 5 mg via INTRAVENOUS

## 2023-12-12 MED ORDER — PHENYLEPHRINE HCL-NACL 20-0.9 MG/250ML-% IV SOLN
INTRAVENOUS | Status: DC | PRN
Start: 2023-12-12 — End: 2023-12-12
  Administered 2023-12-12: 25 ug/min via INTRAVENOUS

## 2023-12-12 MED ORDER — VANCOMYCIN HCL 1000 MG IV SOLR
INTRAVENOUS | Status: AC
  Filled 2023-12-12: qty 20

## 2023-12-12 MED ORDER — FLUCONAZOLE 200 MG PO TABS
200.0000 mg | ORAL_TABLET | Freq: Once | ORAL | Status: AC
  Administered 2023-12-12: 200 mg via ORAL
  Filled 2023-12-12: qty 1

## 2023-12-12 MED ORDER — FENTANYL CITRATE PF 50 MCG/ML IJ SOSY
PREFILLED_SYRINGE | INTRAMUSCULAR | Status: AC
  Administered 2023-12-12: 50 ug via INTRAVENOUS
  Filled 2023-12-12: qty 2

## 2023-12-12 MED ORDER — TRANEXAMIC ACID-NACL 1000-0.7 MG/100ML-% IV SOLN
INTRAVENOUS | Status: DC | PRN
  Administered 2023-12-12: 1000 mg via INTRAVENOUS

## 2023-12-12 MED ORDER — METOCLOPRAMIDE HCL 5 MG PO TABS: 5.0000 mg | ORAL_TABLET | Freq: Three times a day (TID) | ORAL | Status: DC | PRN

## 2023-12-12 MED ORDER — ACETAMINOPHEN 325 MG PO TABS: 325.0000 mg | ORAL_TABLET | Freq: Four times a day (QID) | ORAL | Status: DC | PRN

## 2023-12-12 MED ORDER — VANCOMYCIN HCL IN DEXTROSE 1-5 GM/200ML-% IV SOLN
INTRAVENOUS | Status: AC
  Filled 2023-12-12: qty 200

## 2023-12-12 MED ORDER — METHOCARBAMOL 1000 MG/10ML IJ SOLN: 500.0000 mg | Freq: Four times a day (QID) | INTRAMUSCULAR | Status: DC | PRN

## 2023-12-12 MED ORDER — ONDANSETRON HCL 4 MG/2ML IJ SOLN
INTRAMUSCULAR | Status: AC
  Filled 2023-12-12: qty 2

## 2023-12-12 MED ORDER — ACETAMINOPHEN 10 MG/ML IV SOLN
INTRAVENOUS | Status: DC | PRN
  Administered 2023-12-12: 1000 mg via INTRAVENOUS

## 2023-12-12 MED ORDER — ONDANSETRON HCL 4 MG PO TABS: 4.0000 mg | ORAL_TABLET | Freq: Four times a day (QID) | ORAL | Status: DC | PRN

## 2023-12-12 MED ORDER — CHLORHEXIDINE GLUCONATE 0.12 % MT SOLN
15.0000 mL | Freq: Once | OROMUCOSAL | Status: AC
  Administered 2023-12-12: 15 mL via OROMUCOSAL

## 2023-12-12 MED ORDER — SODIUM CHLORIDE 0.9 % IR SOLN
Status: DC | PRN
  Administered 2023-12-12: 3000 mL

## 2023-12-12 MED ORDER — SUGAMMADEX SODIUM 200 MG/2ML IV SOLN
INTRAVENOUS | Status: AC
  Filled 2023-12-12: qty 2

## 2023-12-12 MED ORDER — PRONTOSAN WOUND IRRIGATION OPTIME
TOPICAL | Status: DC | PRN
Start: 2023-12-12 — End: 2023-12-12
  Administered 2023-12-12: 1 via TOPICAL

## 2023-12-12 MED ORDER — ASPIRIN 81 MG PO TBEC
81.0000 mg | DELAYED_RELEASE_TABLET | Freq: Every day | ORAL | Status: DC
  Administered 2023-12-12 – 2023-12-15 (×4): 81 mg via ORAL
  Filled 2023-12-12 (×4): qty 1

## 2023-12-12 SURGICAL SUPPLY — 32 items
BAG COUNTER SPONGE SURGICOUNT (BAG) IMPLANT
BAG ZIPLOCK 12X15 (MISCELLANEOUS) ×1 IMPLANT
COVER SURGICAL LIGHT HANDLE (MISCELLANEOUS) ×1 IMPLANT
DRAPE STERI IOBAN 125X83 (DRAPES) IMPLANT
DRAPE U-SHAPE 47X51 STRL (DRAPES) ×1 IMPLANT
DRSG AQUACEL AG ADV 3.5X10 (GAUZE/BANDAGES/DRESSINGS) ×1 IMPLANT
DURAPREP 26ML APPLICATOR (WOUND CARE) ×1 IMPLANT
ELECT REM PT RETURN 15FT ADLT (MISCELLANEOUS) ×1 IMPLANT
EVACUATOR 1/8 PVC DRAIN (DRAIN) ×1 IMPLANT
GLOVE BIO SURGEON STRL SZ7.5 (GLOVE) ×1 IMPLANT
GLOVE BIOGEL PI IND STRL 8 (GLOVE) ×2 IMPLANT
GLOVE ECLIPSE 8.0 STRL XLNG CF (GLOVE) ×1 IMPLANT
GOWN STRL REUS W/ TWL XL LVL3 (GOWN DISPOSABLE) ×2 IMPLANT
JET LAVAGE IRRISEPT WOUND (IRRIGATION / IRRIGATOR) ×1 IMPLANT
KIT TURNOVER KIT A (KITS) IMPLANT
LAVAGE JET IRRISEPT WOUND (IRRIGATION / IRRIGATOR) IMPLANT
NS IRRIG 1000ML POUR BTL (IV SOLUTION) ×1 IMPLANT
PACK ANTERIOR HIP CUSTOM (KITS) IMPLANT
PROTECTOR NERVE ULNAR (MISCELLANEOUS) ×1 IMPLANT
SET HNDPC FAN SPRY TIP SCT (DISPOSABLE) ×1 IMPLANT
SOL .9 NS 3000ML IRR UROMATIC (IV SOLUTION) IMPLANT
SOLUTION PRONTOSAN WOUND 350ML (IRRIGATION / IRRIGATOR) IMPLANT
STAPLER SKIN PROX WIDE 3.9 (STAPLE) ×1 IMPLANT
SUT ETHILON 2 0 PSLX (SUTURE) IMPLANT
SUT ETHILON 3 0 PS 1 (SUTURE) ×1 IMPLANT
SUT VIC AB 0 CT1 27XBRD ANTBC (SUTURE) ×2 IMPLANT
SUT VIC AB 0 CT1 36 (SUTURE) IMPLANT
SUT VIC AB 1 CT1 36 (SUTURE) IMPLANT
SWAB COLLECTION DEVICE MRSA (MISCELLANEOUS) ×1 IMPLANT
SWAB CULTURE ESWAB REG 1ML (MISCELLANEOUS) ×1 IMPLANT
TOWEL OR 17X26 10 PK STRL BLUE (TOWEL DISPOSABLE) ×1 IMPLANT
WATER STERILE IRR 1000ML POUR (IV SOLUTION) ×1 IMPLANT

## 2023-12-12 NOTE — Transfer of Care (Signed)
 Immediate Anesthesia Transfer of Care Note  Patient: [name]  Procedure(s) Performed: IRRIGATION AND DEBRIDEMENT HIP (Left: Hip)  Patient Location: PACU  Anesthesia Type:General  Level of Consciousness: awake and patient cooperative  Airway & Oxygen Therapy: Patient Spontanous Breathing and Patient connected to face mask  Post-op Assessment: Report given to RN and Post -op Vital signs reviewed and stable  Post vital signs: Reviewed and stable  Last Vitals:  Vitals Value Taken Time  BP 103/61 12/12/23 1601  Temp    Pulse 57 12/12/23 1603  Resp 10 12/12/23 1603  SpO2 95 % 12/12/23 1603  Vitals shown include unfiled device data.  Last Pain:  Vitals:   12/12/23 1300  TempSrc:   PainSc: 3       Patients Stated Pain Goal: 2 (12/12/23 1300)  Complications: No notable events documented.

## 2023-12-12 NOTE — Anesthesia Postprocedure Evaluation (Signed)
 Anesthesia Post Note  Patient: NAYTHEN HEIKKILA  Procedure(s) Performed: IRRIGATION AND DEBRIDEMENT HIP (Left: Hip)     Patient location during evaluation: PACU Anesthesia Type: General Level of consciousness: awake and alert Pain management: pain level controlled Vital Signs Assessment: post-procedure vital signs reviewed and stable Respiratory status: spontaneous breathing, nonlabored ventilation, respiratory function stable and patient connected to nasal cannula oxygen Cardiovascular status: blood pressure returned to baseline and stable Postop Assessment: no apparent nausea or vomiting Anesthetic complications: no  No notable events documented.  Last Vitals:  Vitals:   12/12/23 1645 12/12/23 1737  BP: 117/68 119/69  Pulse: (!) 56 (!) 52  Resp: 16 16  Temp:  36.9 C  SpO2: 97% 97%    Last Pain:  Vitals:   12/12/23 1737  TempSrc: Oral  PainSc:                  Trevor Iha

## 2023-12-12 NOTE — Plan of Care (Signed)
   Problem: Education: Goal: Knowledge of General Education information will improve Description: Including pain rating scale, medication(s)/side effects and non-pharmacologic comfort measures Outcome: Progressing   Problem: Clinical Measurements: Goal: Ability to maintain clinical measurements within normal limits will improve Outcome: Progressing Goal: Diagnostic test results will improve Outcome: Progressing Goal: Cardiovascular complication will be avoided Outcome: Progressing   Problem: Activity: Goal: Risk for activity intolerance will decrease Outcome: Progressing

## 2023-12-12 NOTE — Interval H&P Note (Signed)
 History and Physical Interval Note: The patient understands he is here today for an incision and drainage/irrigation debridement of a chronic left hip prosthetic joint infection.  The risks and benefits of surgery have been discussed and explained in detail and informed consent has been obtained.  The left operative hip has been marked.  12/12/2023 1:45 PM  [name]  has presented today for surgery, with the diagnosis of chronic infection left hip.  The various methods of treatment have been discussed with the patient and family. After consideration of risks, benefits and other options for treatment, the patient has consented to  Procedure(s) with comments: IRRIGATION AND DEBRIDEMENT HIP (Left) - Irrigation and Debridement left hip as a surgical intervention.  The patient's history has been reviewed, patient examined, no change in status, stable for surgery.  I have reviewed the patient's chart and labs.  Questions were answered to the patient's satisfaction.     Kathryne Hitch

## 2023-12-12 NOTE — Anesthesia Procedure Notes (Addendum)
 Procedure Name: Intubation Date/Time: 12/12/2023 1:56 PM  Performed by: Elisabeth Cara, CRNAPre-anesthesia Checklist: Patient identified, Emergency Drugs available, Suction available, Patient being monitored and Timeout performed Patient Re-evaluated:Patient Re-evaluated prior to induction Oxygen Delivery Method: Circle system utilized Preoxygenation: Pre-oxygenation with 100% oxygen Induction Type: IV induction Ventilation: Mask ventilation without difficulty Laryngoscope Size: Mac and 4 Grade View: Grade I Tube type: Oral Tube size: 7.5 mm Number of attempts: 1 Airway Equipment and Method: Stylet Placement Confirmation: ETT inserted through vocal cords under direct vision, positive ETCO2 and breath sounds checked- equal and bilateral Secured at: 22 cm Tube secured with: Tape Dental Injury: Teeth and Oropharynx as per pre-operative assessment

## 2023-12-12 NOTE — Op Note (Signed)
 Operative Note  Date of operation: 12/12/2023 Preoperative diagnosis: Chronic infection left total hip prosthesis, subsequent encounter Postoperative diagnosis: Same  Procedure: Irrigation and debridement/incision and drainage left hip  Surgeon: Vanita Panda. Magnus Ivan, MD Assistant: Rexene Edison, PA-C  Anesthesia: General Antibiotics: IV vancomycin EBL: 200 cc Complications: None  Indications: The patient is a 78 year old gentleman who has a complex history as a relates to his left hip.  He had a total hip arthroplasty years ago for the left hip.  The acetabular component failed and he ended up having several surgeries on the left hip as related to continued failure of the acetabular opponent.  Eventually he unfortunately developed an infection of that left hip.  At this point he has been on chronic suppressive antibiotics.  He understands fully that the only way to eradicate the infection would be to completely remove all hardware from the joint.  He is reluctant to allow for that.  Every 70 years we have had to take him back to the operating room and irrigate out the hip.  About a week or 2 ago he developed redness around his incision.  He is followed by the infectious disease specialist and sees them in clinic.  They have done some adjustments to his antibiotics.  He then presented this week with worsening redness and draining bolus from his hip incision.  At this point we have recommended a repeat irrigation debridement understanding that we are still not can to be able to fully treat and eradicate the infection without taking out hardware which he again will not consent to.  He understands fully the risk and benefits of the surgery.  Procedure description: After informed consent was obtained appropriate left hip was marked, he was brought to the operating room and general anesthesia was obtained while he is on the stretcher.  He was then laid supine on the Hana fracture table with a perineal  post and placed in both legs in skeletal traction devices no traction applied.  His left operative hip was prepped and draped with DuraPrep and sterile drapes.  Timeout was called and he was notified prescription patient correct left hip.  We then completely ellipsed out his previous incision that did have a draining bulla.  We found sinus tracts down to the hip joint.  We performed a sharp excisional braid meant of necrotic and infected skin, fascia and soft tissue.  We then opened up the hip joint after we fully irrigated out the superficial and deep tissues.  In the hip joint we did encounter some gross purulence as well.  We used a rondure and a #10 blade to sharply excise necrotic tissue around the hip joint trying to get his rid of his much infected appearing or chronic appearing tissue.  We then irrigated out the entire joint with 3 L normal saline solution using pulsatile lavage.  We then used a chlorhexidine tablet solution that we irrigated the joint with all of that set and they are followed by a second antibiotic irrigant solution that we let sit in the joint for a while.  We then removed all of this fluid irrigant and placed a large amount of vancomycin powder around the arthrotomy and joint itself.  We then closed the deep tissue with #1 Vicryl suture followed by 0 Vicryl close the next layer and 2-0 Vicryl close subcutaneous tissue.  The skin was closed with interrupted 2-0 nylon suture.  Well-padded sterile dressing was applied.  The patient was taken off the [facility]  table, awakened and extubated, and taken recovery room in stable condition.  Clark PA-C did assist during the entirety of the case and his assistance was quite crucial for helping assist with soft tissue management.

## 2023-12-13 ENCOUNTER — Encounter (HOSPITAL_COMMUNITY): Payer: Self-pay | Admitting: Orthopaedic Surgery

## 2023-12-13 LAB — CBC
HCT: 36.9 % — ABNORMAL LOW (ref 39.0–52.0)
Hemoglobin: 11.1 g/dL — ABNORMAL LOW (ref 13.0–17.0)
MCH: 25.1 pg — ABNORMAL LOW (ref 26.0–34.0)
MCHC: 30.1 g/dL (ref 30.0–36.0)
MCV: 83.5 fL (ref 80.0–100.0)
Platelets: 235 10*3/uL (ref 150–400)
RBC: 4.42 MIL/uL (ref 4.22–5.81)
RDW: 16.6 % — ABNORMAL HIGH (ref 11.5–15.5)
WBC: 5.6 10*3/uL (ref 4.0–10.5)
nRBC: 0 % (ref 0.0–0.2)

## 2023-12-13 LAB — BASIC METABOLIC PANEL
Anion gap: 9 (ref 5–15)
BUN: 22 mg/dL (ref 8–23)
CO2: 23 mmol/L (ref 22–32)
Calcium: 9.2 mg/dL (ref 8.9–10.3)
Chloride: 103 mmol/L (ref 98–111)
Creatinine, Ser: 1.04 mg/dL (ref 0.61–1.24)
GFR, Estimated: >60 mL/min (ref >60)
Glucose, Bld: 139 mg/dL — ABNORMAL HIGH (ref 70–99)
Potassium: 5 mmol/L (ref 3.5–5.1)
Sodium: 135 mmol/L (ref 135–145)

## 2023-12-13 MED ORDER — CEFAZOLIN SODIUM-DEXTROSE 2-4 GM/100ML-% IV SOLN
2.0000 g | Freq: Three times a day (TID) | INTRAVENOUS | Status: DC
  Administered 2023-12-13 – 2023-12-15 (×6): 2 g via INTRAVENOUS
  Filled 2023-12-13 (×7): qty 100

## 2023-12-13 NOTE — TOC Initial Note (Signed)
 Transition of Care [facility]) - Initial/Assessment Note    Patient Details  Name: [name] MRN: [id number] Date of Birth: [birth year]  Transition of Care [facility]) CM/SW Contact:    Adrian Prows, RN Phone Number: 12/13/2023, 12:38 PM  Clinical Narrative:                 Sherron Monday w/ pt in room; pt says he lives at home w/ his wife and dtr; he plans to return at d/c; pt identified POC dtr Octavio Graves [phone]); he verified PCP/Insurance; pt denies SDOH risks; he has cane, walker, and wheelchair; pt says he does not have HH services or home oxygen; TOC will follow.  Expected Discharge Plan: Home/Self Care Barriers to Discharge: Continued Medical Work up   Patient Goals and CMS Choice Patient states their goals for this hospitalization and ongoing recovery are:: home CMS Medicare.gov Compare Post Acute Care list provided to:: Patient        Expected Discharge Plan and Services   Discharge Planning Services: CM Consult   Living arrangements for the past 2 months: Single Family Home                                      Prior Living Arrangements/Services Living arrangements for the past 2 months: Single Family Home Lives with:: Adult Children, Spouse Patient language and need for interpreter reviewed:: Yes Do you feel safe going back to the place where you live?: Yes      Need for Family Participation in Patient Care: Yes (Comment) Care giver support system in place?: Yes (comment) Current home services: DME (cane, walker, wheelchair) Criminal Activity/Legal Involvement Pertinent to Current Situation/Hospitalization: No - Comment as needed  Activities of Daily Living   ADL Screening (condition at time of admission) Independently performs ADLs?: Yes (appropriate for developmental age) Is the patient deaf or have difficulty hearing?: No Does the patient have difficulty seeing, even when wearing glasses/contacts?: No Does the patient have difficulty  concentrating, remembering, or making decisions?: No  Permission Sought/Granted Permission sought to share information with : Case Manager Permission granted to share information with : Yes, Verbal Permission Granted  Share Information with NAME: Case Manager     Permission granted to share info w Relationship: Octavio Graves (dtr) [phone]     Emotional Assessment Appearance:: Appears stated age Attitude/Demeanor/Rapport: Gracious Affect (typically observed): Accepting Orientation: : Oriented to Self, Oriented to Place, Oriented to  Time, Oriented to Situation Alcohol / Substance Use: Not Applicable Psych Involvement: No (comment)  Admission diagnosis:  Chronic infection of left hip, 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 [facility]) [M00.9] Patient Active Problem List   Diagnosis Date Noted   Abscess of bursa of left hip 12/12/2023   Traumatic seroma of left thigh, subsequent encounter 09/19/2023   Chronic infection of left hip, 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) 08/15/2023   Recurrent seroma left thigh 08/14/2023   Left hip prosthetic joint infection (HCC) 01/03/2023   Prosthetic hip infection, subsequent encounter 02/11/2020   Prosthetic joint infection of left hip (HCC) 02/10/2020   Loosening of prosthetic hip (HCC) 09/08/2017   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   Pain in left hip 09/01/2017   History of revision of total replacement of left hip joint 09/01/2017   Encounter for surveillance of recalled total hip arthroplasty hardware (HCC) 06/07/2016   Status post revision of total hip replacement 06/07/2016   Spinal stenosis, lumbar region,  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   Red blood cell abnormality 02/11/2016   Elevated blood pressure reading without diagnosis of hypertension 07/01/2013   Peripheral neuropathy 06/02/2013   Health maintenance examination 11/26/2011   Prostate cancer (HCC) 05/21/2010   Obesity, Class I, BMI 30-34.9 05/18/2010   Venous (peripheral)  insufficiency 05/18/2010   Osteoarthrosis involving lower leg 05/18/2010   PCP:  Jeoffrey Massed, MD Pharmacy:   [facility] [phone] - [place], [place] - [address] [place] [address] Phone: [phone] Fax: [phone]     Social Drivers of Health (SDOH) Social History: SDOH Screenings   Food Insecurity: No Food Insecurity (12/13/2023)  Housing: Low Risk  (12/13/2023)  Transportation Needs: No Transportation Needs (12/13/2023)  Utilities: Not At Risk (12/13/2023)  Alcohol Screen: Low Risk  (01/03/2022)  Depression (PHQ2-9): Low Risk  (05/05/2023)  Financial Resource Strain: Low Risk  (03/26/2023)  Physical Activity: Sufficiently Active (03/26/2023)  Social Connections: Socially Integrated (12/12/2023)  Stress: No Stress Concern Present (03/26/2023)  Tobacco Use: Low Risk  (12/12/2023)   SDOH Interventions: Food Insecurity Interventions: Intervention Not Indicated, Inpatient TOC Housing Interventions: Intervention Not Indicated, Inpatient TOC Transportation Interventions: Intervention Not Indicated, Inpatient TOC Utilities Interventions: Intervention Not Indicated, Inpatient TOC   Readmission Risk Interventions    09/23/2023   11:03 AM 01/06/2023   12:56 PM  Readmission Risk Prevention Plan  Post Dischage Appt Complete Complete  Medication Screening Complete Complete  Transportation Screening Complete Complete

## 2023-12-13 NOTE — Progress Notes (Signed)
 Patient ID: [name], male   DOB: 1946-09-22, 78 y.o.   MRN: [id number] [name] is comfortable this morning.  He says there is not much pain at all.  His white blood cell count is also normal.  The cultures did not grow anything from his left hip but I do not think they will given the fact that he is on chronic suppressive antibiotics.  He understands that we want to keep him here through the weekend on IV antibiotics and discharge him to home Monday back on his oral antibiotic regimen.  He is mobilizing well.  I did change his left hip dressing this morning and the incision does look better.  He continues to understand that the only way to eradicate infection would be to remove all hardware from his hip but that will also be devastating for him from a mobility standpoint.  We will continue a diligent effort to suppress the infection.  Again, the plan is for IV antibiotics through the weekend with discharge to home Monday.

## 2023-12-14 LAB — CBC
HCT: 34.1 % — ABNORMAL LOW (ref 39.0–52.0)
Hemoglobin: 10.2 g/dL — ABNORMAL LOW (ref 13.0–17.0)
MCH: 25.2 pg — ABNORMAL LOW (ref 26.0–34.0)
MCHC: 29.9 g/dL — ABNORMAL LOW (ref 30.0–36.0)
MCV: 84.2 fL (ref 80.0–100.0)
Platelets: 216 10*3/uL (ref 150–400)
RBC: 4.05 MIL/uL — ABNORMAL LOW (ref 4.22–5.81)
RDW: 16.7 % — ABNORMAL HIGH (ref 11.5–15.5)
WBC: 5.7 10*3/uL (ref 4.0–10.5)
nRBC: 0 % (ref 0.0–0.2)

## 2023-12-14 NOTE — Progress Notes (Signed)
  Subjective: Patient stable.  Pain controlled.   Objective: Vital signs in last 24 hours: Temp:  [97.3 F (36.3 C)-98.2 F (36.8 C)] 98.1 F (36.7 C) (03/09 0447) Pulse Rate:  [51-58] 53 (03/09 0447) Resp:  [15-17] 16 (03/09 0447) BP: (106-133)/(60-62) 133/62 (03/09 0447) SpO2:  [94 %-98 %] 96 % (03/09 0447)  Intake/Output from previous day: 03/08 0701 - 03/09 0700 In: 2568.5 [P.O.:890; I.V.:1083.9; IV Piggyback:594.6] Out: 2200 [Urine:2200] Intake/Output this shift: No intake/output data recorded.  Exam:  Intact pulses distally Compartment soft  Labs: Recent Labs    12/12/23 1614 12/13/23 0517 12/14/23 0509  HGB 11.8* 11.1* 10.2*   Recent Labs    12/13/23 0517 12/14/23 0509  WBC 5.6 5.7  RBC 4.42 4.05*  HCT 36.9* 34.1*  PLT 235 216   Recent Labs    12/12/23 1614 12/13/23 0517  NA 131* 135  K 4.6 5.0  CL 103 103  CO2 22 23  BUN 19 22  CREATININE 0.97 1.04  GLUCOSE 127* 139*  CALCIUM 8.6* 9.2   No results for input(s): "LABPT", "INR" in the last 72 hours.  Assessment/Plan: Plan at this time is mobilize with physical therapy and we anticipate discharge tomorrow.  Continue IV antibiotics through the weekend.  Home on oral antibiotics tomorrow.   Marrianne Mood Ilaria Much 12/14/2023, 9:54 AM

## 2023-12-14 NOTE — Plan of Care (Signed)

## 2023-12-14 NOTE — Plan of Care (Signed)
   Problem: Education: Goal: Knowledge of General Education information will improve Description: Including pain rating scale, medication(s)/side effects and non-pharmacologic comfort measures Outcome: Progressing   Problem: Health Behavior/Discharge Planning: Goal: Ability to manage health-related needs will improve Outcome: Progressing   Problem: Clinical Measurements: Goal: Will remain free from infection Outcome: Progressing

## 2023-12-14 NOTE — Progress Notes (Signed)
 Mobility Specialist - Progress Note   12/14/23 1027  Mobility  Activity Ambulated with assistance in hallway  Level of Assistance Modified independent, requires aide device or extra time  Assistive Device Front wheel walker  Distance Ambulated (ft) 500 ft  Activity Response Tolerated well  Mobility Referral Yes  Mobility visit 1 Mobility  Mobility Specialist Start Time (ACUTE ONLY) 1016  Mobility Specialist Stop Time (ACUTE ONLY) 1027  Mobility Specialist Time Calculation (min) (ACUTE ONLY) 11 min   Pt received in bed and agreeable to mobility. No complaints during session. Upon returning to room, pt requested assistance to the bathroom. Pt to recliner after session with all needs met.    [facility]

## 2023-12-15 ENCOUNTER — Other Ambulatory Visit: Payer: Self-pay | Admitting: *Deleted

## 2023-12-15 MED ORDER — OXYCODONE HCL 5 MG PO TABS
5.0000 mg | ORAL_TABLET | Freq: Four times a day (QID) | ORAL | 0 refills | Status: DC | PRN
Start: 2023-12-15 — End: 2024-05-03

## 2023-12-15 NOTE — Care Management Important Message (Signed)
 Important Message  Patient Details IM Letter given to the Patient. Name: [name] MRN: [id number] Date of Birth: Mar 04, 1946   Important Message Given:        [name] 12/15/2023, 9:40 AM

## 2023-12-15 NOTE — Progress Notes (Signed)
 AVS reviewed w/ pt who verbalized an understanding- pt medicated for pain as noted - dose time for oxycodone updated in pen  on AVS by this RN. No other questions at this time. PIV removed as noted to lobby via w/c - cane w/ pt. Home w/ wife and daughter

## 2023-12-15 NOTE — Discharge Instructions (Signed)

## 2023-12-15 NOTE — TOC Transition Note (Signed)
 Transition of Care [facility]) - Discharge Note   Patient Details  Name: [name] MRN: [id number] Date of Birth: 09/28/1946  Transition of Care [facility]) CM/SW Contact:  Amada Jupiter, LCSW Phone Number: 12/15/2023, 9:44 AM   Clinical Narrative:     Pt medically cleared for dc home today.  HHRN to resume via [facility] per pt request.  Pt has all needed DME in the home.  No further TOC needs.  Final next level of care: Home w Home Health Services Barriers to Discharge: Barriers Resolved   Patient Goals and CMS Choice Patient states their goals for this hospitalization and ongoing recovery are:: home CMS Medicare.gov Compare Post Acute Care list provided to:: Patient        Discharge Placement                       Discharge Plan and Services Additional resources added to the After Visit Summary for     Discharge Planning Services: CM Consult            DME Arranged: N/A DME Agency: NA       HH Arranged: RN HH Agency: [facility] Date [facility] Agency Contacted: 12/15/23 Time HH Agency Contacted: [phone] Representative spoke with at [facility] Agency: Kandee Keen  Social Drivers of Health (SDOH) Interventions SDOH Screenings   Food Insecurity: No Food Insecurity (12/13/2023)  Housing: Low Risk  (12/13/2023)  Transportation Needs: No Transportation Needs (12/13/2023)  Utilities: Not At Risk (12/13/2023)  Alcohol Screen: Low Risk  (01/03/2022)  Depression (PHQ2-9): Low Risk  (05/05/2023)  Financial Resource Strain: Low Risk  (03/26/2023)  Physical Activity: Sufficiently Active (03/26/2023)  Social Connections: Socially Integrated (12/12/2023)  Stress: No Stress Concern Present (03/26/2023)  Tobacco Use: Low Risk  (12/12/2023)     Readmission Risk Interventions    12/15/2023    9:43 AM 09/23/2023   11:03 AM 01/06/2023   12:56 PM  Readmission Risk Prevention Plan  Post Dischage Appt Complete Complete Complete  Medication Screening Complete Complete Complete  Transportation Screening  Complete Complete Complete

## 2023-12-15 NOTE — Progress Notes (Signed)
 Subjective: 3 Days Post-Op Procedure(s) (LRB): IRRIGATION AND DEBRIDEMENT HIP (Left) Patient reports pain as mild.  No complaints wants to discharge to home.   Objective: Vital signs in last 24 hours: Temp:  [97.4 F (36.3 C)-97.9 F (36.6 C)] 97.4 F (36.3 C) (03/10 0516) Pulse Rate:  [52-59] 55 (03/10 0516) Resp:  [15-17] 16 (03/10 0516) BP: (120-135)/(68-76) 120/76 (03/10 0516) SpO2:  [98 %-99 %] 98 % (03/10 0516)  Intake/Output from previous day: 03/09 0701 - 03/10 0700 In: 2444.6 [P.O.:1840; I.V.:304.6; IV Piggyback:300] Out: 2600 [Urine:2600] Intake/Output this shift: No intake/output data recorded.  Recent Labs    12/12/23 1614 12/13/23 0517 12/14/23 0509  HGB 11.8* 11.1* 10.2*   Recent Labs    12/13/23 0517 12/14/23 0509  WBC 5.6 5.7  RBC 4.42 4.05*  HCT 36.9* 34.1*  PLT 235 216   Recent Labs    12/12/23 1614 12/13/23 0517  NA 131* 135  K 4.6 5.0  CL 103 103  CO2 22 23  BUN 19 22  CREATININE 0.97 1.04  GLUCOSE 127* 139*  CALCIUM 8.6* 9.2   No results for input(s): "LABPT", "INR" in the last 72 hours.  Dorsiflexion/Plantar flexion intact Incision: scant drainage Compartment soft   Assessment/Plan: 3 Days Post-Op Procedure(s) (LRB): IRRIGATION AND DEBRIDEMENT HIP (Left) Up with therapy Discharge home with home health Continue oral antibiotics.  Dressing changed    [name] 12/15/2023, 8:15 AM

## 2023-12-15 NOTE — Discharge Summary (Signed)
 Patient ID: [name] MRN: [id number] DOB/AGE: [birth year] 78 y.o.  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  Dislocated posteriorly 2015, ? partial RC tear, has f/u planned with Dr. August Saucer--? surgery? possible plan for 2016.   Blood donor, whole blood    Has donated approx 30 gallons in his lifetime   Chronic left hip pain    Severe DJD.  THA 2008.  Left acetabular revision in 2017 AND 2019.   Chronic pain syndrome    Candidate for spinal cord stimulator (03/2017)   Chronic renal insufficiency, stage II (mild)    Chronic venous insufficiency    +compression hose   DDD (degenerative disc disease), lumbar 2017   Dr. Joette Catching ortho.  He is now wearing a new leg brace.  Has had ESI and will get another.   Diverticulosis    +redundant colon   DVT (deep venous thrombosis) (HCC) 06/14/2016   provoked, bilateral LL's; in post-op setting s/p hip surgery.  Xarelto x 73mo at full dosing, then 6 more months at 1/2 dosing, then stopped xarelto.     Foot drop, left    s/p hip fracture 1969   History of blood transfusion    Hx of adenomatous colonic polyps    polypectomy 2007; 2012; 2015; 05/2019; recall 31yrs   Hypercholesterolemia 02/2017   Recommended statin 02/10/17 but pt declined, wanted to do TLC trial first. Rec'd statin 04/2020   IFG (impaired fasting glucose)    04/2023 fasting 111, a1c 5.7%   Neuropathy    Osteoarthritis    knees and hips primarily   Phlebitis    Post laminectomy syndrome    improved with spinal cord stim-->stim to be removed summer/fall 2019.   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 PSA 0.16 Feb 2020 urol f/u. [name] Aug 2021 urol. <0.015 02/2021.   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 male who was admitted 12/12/2023 for operative treatment ofLeft hip prosthetic joint infection (HCC). Patient has severe unremitting pain that affects sleep, daily activities, and work/hobbies. After pre-op clearance the patient was taken to the operating room on 12/12/2023 and underwent  Procedure(s): IRRIGATION AND DEBRIDEMENT HIP.    Patient was given perioperative antibiotics:  Anti-infectives (From admission, onward)    Start     Dose/Rate Route Frequency Ordered Stop   12/13/23 1400  ceFAZolin (ANCEF) IVPB 2g/100 mL premix        2 g 200 mL/hr over 30 Minutes Intravenous Every 8 hours 12/13/23 1121     12/12/23 2200  cefadroxil (DURICEF) capsule 1,000 mg  Status:  Discontinued       Note to Pharmacy: Take 2 capsules (1000 MG total) by mouth 2 (two) times daily for 2 weeks. Then Take 1  tablet (500 MG total) by mouth 2 (two) times daily.     1,000 mg Oral 2 times daily 12/12/23 1715 12/13/23 1126   12/12/23 2200  vancomycin (VANCOCIN) IVPB 1000 mg/200 mL premix        1,000 mg 200 mL/hr over 60 Minutes Intravenous Every 12 hours 12/12/23 1715 12/15/23 2159   12/12/23 2200  fluconazole (DIFLUCAN) tablet 200 mg        200 mg Oral  Once 12/12/23 1715 12/12/23 2130   12/12/23 1445  vancomycin (VANCOCIN) powder  Status:  Discontinued          As needed 12/12/23 1448 12/12/23 1709   12/12/23 1344  vancomycin (VANCOCIN) 1-5 GM/200ML-% IVPB       Note to Pharmacy: [name] A: cabinet  override      12/12/23 1344 12/12/23 1724   12/12/23 1300  ceFAZolin (ANCEF) IVPB 2g/100 mL premix        2 g 200 mL/hr over 30 Minutes Intravenous On call to O.R. 12/12/23 1246 12/12/23 1427        Patient was given sequential compression devices, early ambulation, and chemoprophylaxis to prevent DVT.  Patient benefited maximally from hospital stay and there were no complications.    Recent vital signs: Patient Vitals for the past 24 hrs:  BP Temp Temp src Pulse Resp SpO2  12/15/23 0516 120/76 (!) 97.4 F (36.3 C) Oral (!) 55 16 98 %  12/14/23 2105 124/68 97.9 F (36.6 C) Oral (!) 52 15 99 %  12/14/23 1228 135/69 97.6 F (36.4 C) Oral (!) 59 17 98 %     Recent laboratory studies:  Recent Labs    12/12/23 1614 12/13/23 0517 12/14/23 0509  WBC 5.0 5.6 5.7  HGB 11.8* 11.1* 10.2*  HCT 38.8* 36.9* 34.1*  PLT 226 235 216  NA 131* 135  --   K 4.6 5.0  --   CL 103 103  --   CO2 22 23  --   BUN 19 22  --   CREATININE 0.97 1.04  --   GLUCOSE 127* 139*  --   CALCIUM 8.6* 9.2  --      Discharge Medications:   Allergies as of 12/15/2023       Reactions   Adhesive [tape] Rash   Paper tape is ok        Medication List     TAKE these medications    acetaminophen 500 MG tablet Commonly known as: TYLENOL Take 1,000 mg by mouth 2 (two) times daily as needed for moderate pain or headache.   aspirin EC 81 MG tablet Take 81 mg by mouth daily.   atorvastatin 20 MG tablet Commonly known as: LIPITOR Take 1 tablet (20 mg total) by mouth daily. What changed: when to take this   CALCIUM + D3 PO Take 600 mg by mouth daily.   cefadroxil 500 MG capsule Commonly known as: DURICEF Take 2 capsules (1000 MG total) by mouth 2 (two) times daily for 2 weeks. Then Take 1 tablet (500 MG total) by mouth 2 (two) times daily. What changed:  how much to take how to take this when to take this additional instructions   oxyCODONE 5 MG immediate release tablet Commonly known as:  Oxy IR/ROXICODONE Take 1-2 tablets (5-10 mg total) by mouth every 6 (six) hours as needed for moderate pain (pain score 4-6).   solifenacin 10 MG tablet Commonly known as: VESICARE Take 10 mg by mouth daily.  Diagnostic Studies: No results found.  Disposition: Discharge disposition: 06-Home-Health Care Svc          Follow-up Information     Kathryne Hitch, MD Follow up in 2 week(s).   Specialty: Orthopedic Surgery Contact information: [address] [place] [address] [phone]                  Signed: Richardean Canal 12/15/2023, 8:20 AM

## 2023-12-16 ENCOUNTER — Telehealth: Payer: Self-pay | Admitting: *Deleted

## 2023-12-16 NOTE — Transitions of Care (Post Inpatient/ED Visit) (Signed)
 12/16/2023  Name: [name] MRN: [id number] DOB: Oct 25, 1945  Today's TOC FU Call Status: Today's TOC FU Call Status:: Successful TOC FU Call Completed TOC FU Call Complete Date: 12/16/23 Patient's Name and Date of Birth confirmed.  Transition Care Management Follow-up Telephone Call Date of Discharge: 12/15/23 Discharge Facility: [facility]) Type of Discharge: Inpatient Admission Primary Inpatient Discharge Diagnosis:: Left hip prosthetic joint infection How have you been since you were released from the hospital?: Better Any questions or concerns?: No  Items Reviewed: Did you receive and understand the discharge instructions provided?: Yes Medications obtained,verified, and reconciled?: Yes (Medications Reviewed) Any new allergies since your discharge?: No Dietary orders reviewed?: No Do you have support at home?: Yes People in Home: spouse Name of Support/Comfort Primary Source: [name]  Medications Reviewed Today: Medications Reviewed Today     Reviewed by Luella Cook, RN (Case Manager) on 12/16/23 at 0911  Med List Status: <None>   Medication Order Taking? Sig Documenting Provider Last Dose Status Informant  acetaminophen (TYLENOL) 500 MG tablet [id number] Yes Take 1,000 mg by mouth 2 (two) times daily as needed for moderate pain or headache.  [provider] Taking Active Self  aspirin EC 81 MG tablet [id number] Yes Take 81 mg by mouth daily. [provider] Taking Active Self  atorvastatin (LIPITOR) 20 MG tablet [id number] Yes Take 1 tablet (20 mg total) by mouth daily.  Patient taking differently: Take 20 mg by mouth at bedtime.   Jeoffrey Massed, MD Taking Active Self  Calcium Carb-Cholecalciferol (CALCIUM + D3 PO) [id number] Yes Take 600 mg by mouth daily. [provider] Taking Active Self  cefadroxil (DURICEF) 500 MG capsule [id number] Yes Take 2 capsules (1000 MG total) by mouth 2 (two) times daily for 2 weeks. Then Take 1  tablet (500 MG total) by mouth 2 (two) times daily.  Patient taking differently: Take 1,000 mg by mouth 2 (two) times daily.   Raymondo Band, MD Taking Active Self  oxyCODONE (OXY IR/ROXICODONE) 5 MG immediate release tablet [id number] Yes Take 1-2 tablets (5-10 mg total) by mouth every 6 (six) hours as needed for moderate pain (pain score 4-6). Kirtland Bouchard, PA-C Taking Active   solifenacin (VESICARE) 10 MG tablet [id number] Yes Take 10 mg by mouth daily. [provider] Taking Active Self            Home Care and Equipment/Supplies: Were Home Health Services Ordered?: Yes Name of Home Health Agency:: [facility] Has Agency set up a time to come to your home?: Yes First Home Health Visit Date: 12/16/23 Any new equipment or medical supplies ordered?: NA  Functional Questionnaire: Do you need assistance with bathing/showering or dressing?: Yes Do you need assistance with meal preparation?: Yes Do you need assistance with eating?: No Do you have difficulty maintaining continence: No Do you need assistance with getting out of bed/getting out of a chair/moving?: No Do you have difficulty managing or taking your medications?: No  Follow up appointments reviewed: PCP Follow-up appointment confirmed?: NA Specialist Hospital Follow-up appointment confirmed?: Yes Date of Specialist follow-up appointment?: 12/25/23 Follow-Up Specialty Provider:: [id number] Dr Magnus Ivan, [id number] Dr Renold Don Do you need transportation to your follow-up appointment?: No Do you understand care options if your condition(s) worsen?: Yes-patient verbalized understanding  SDOH Interventions Today    Flowsheet Row Most Recent Value  SDOH Interventions   Food Insecurity Interventions Intervention Not Indicated  Housing Interventions Intervention Not Indicated  Transportation Interventions Intervention Not Indicated, Patient  Resources (Friends/Family)  Utilities Interventions Intervention Not Indicated       Interventions Today    Flowsheet Row Most Recent Value  Chronic Disease   Chronic disease during today's visit Other  [Left hip prosthetic joint infection]  General Interventions   General Interventions Discussed/Reviewed General Interventions Discussed, General Interventions Reviewed, Doctor Visits  Doctor Visits Discussed/Reviewed Doctor Visits Discussed, Doctor Visits Reviewed, Specialist  Education Interventions   Education Provided Provided Education  Provided Verbal Education On Other  [Discussed wound care.]  Pharmacy Interventions   Pharmacy Dicussed/Reviewed Pharmacy Topics Discussed, Pharmacy Topics Reviewed     Patient declined further [facility] outreach  Gean Maidens BSN RN [employer] Population Health Care Management Coordinator [email or web address] Direct Dial: [phone]  Fax: [phone] Website: [email or web address]

## 2023-12-17 LAB — AEROBIC/ANAEROBIC CULTURE W GRAM STAIN (SURGICAL/DEEP WOUND)

## 2023-12-25 ENCOUNTER — Ambulatory Visit: Admitting: Orthopaedic Surgery

## 2023-12-25 ENCOUNTER — Encounter: Payer: Self-pay | Admitting: Orthopaedic Surgery

## 2023-12-25 DIAGNOSIS — M009 Pyogenic arthritis, unspecified: Secondary | ICD-10-CM

## 2023-12-25 NOTE — Progress Notes (Signed)
 The patient comes in today almost 2 weeks status post an I&D of a chronic infected left prosthetic hip joint.  He had had an acute flareup of his infection with a draining wound.  We were able to ellipse out the wound and fully irrigated out the joint.  He understands the only way to eradicate infection will be to remove all components which she is definitely reluctant to do given his age combined with the fact that he is taking care of his wife who has some issues with dementia.  He feels better overall but does report some left thigh swelling.  His left hip incision looks good but I would like to leave the sutures in 1 more week.  I placed a new Aquacel dressing and gave another new dressing.  The swelling I think is to be expected.  Fortunately all the cellulitis and redness is gone.  The wound itself looks good.  Will see him back in 1 week for suture removal.  All questions and concerns were addressed and answered.

## 2024-01-01 ENCOUNTER — Other Ambulatory Visit: Payer: Self-pay | Admitting: Urology

## 2024-01-01 ENCOUNTER — Other Ambulatory Visit: Payer: Self-pay

## 2024-01-01 ENCOUNTER — Ambulatory Visit: Payer: Self-pay | Admitting: Internal Medicine

## 2024-01-01 ENCOUNTER — Encounter: Payer: Self-pay | Admitting: Internal Medicine

## 2024-01-01 VITALS — BP 134/74 | HR 74 | Resp 16 | Ht 73.0 in

## 2024-01-01 DIAGNOSIS — T8450XD Infection and inflammatory reaction due to unspecified internal joint prosthesis, subsequent encounter: Secondary | ICD-10-CM

## 2024-01-01 DIAGNOSIS — T8452XD Infection and inflammatory reaction due to internal left hip prosthesis, subsequent encounter: Secondary | ICD-10-CM | POA: Diagnosis not present

## 2024-01-01 DIAGNOSIS — M899 Disorder of bone, unspecified: Secondary | ICD-10-CM

## 2024-01-01 DIAGNOSIS — T847XXD Infection and inflammatory reaction due to other internal orthopedic prosthetic devices, implants and grafts, subsequent encounter: Secondary | ICD-10-CM

## 2024-01-01 NOTE — Patient Instructions (Signed)
 Please see me again in around 4 weeks  We'll keep the high dose cefadroxil for at least the next 3 months if not longer   No labs today

## 2024-01-01 NOTE — Progress Notes (Signed)
 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   Pain in left hip 09/01/2017   History of revision of total replacement of left hip joint 09/01/2017   Encounter for surveillance of recalled total hip arthroplasty hardware (HCC) 06/07/2016   Status post revision of total hip replacement 06/07/2016   Spinal stenosis, lumbar region, 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   Red blood cell abnormality 02/11/2016   Elevated blood pressure reading without diagnosis of hypertension 07/01/2013   Peripheral neuropathy 06/02/2013   Health maintenance examination 11/26/2011   Prostate cancer (HCC) 05/21/2010   Obesity, Class I, BMI 30-34.9 05/18/2010   Venous (peripheral) insufficiency 05/18/2010   Osteoarthrosis involving lower leg 05/18/2010      Subjective:    Patient ID: [name], male    DOB: 08-28-1946, 78 y.o.   MRN: [id number]  No chief complaint on file.   HPI:  [name] is a 78 y.o. 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 Here for id clinic f/u   01/16/23 id f/u 01/03/23 operative culture  mssa Patient doing well Hip incision has one little spot still not closed yet. All sutures intact. There is a lot of serosanguinous drainage and he had to change pad 2-3 times a day depending on activity No pain along the incision or in the groin.  He'll see dr Magnus Ivan today  He is taking also the cefadroxil together with cefazolin which he didn't know he not supposed to   02/21/23 id clinic f/u Doing well on cephadroxil on 02/15/23; picc removed No hip pain Opat labs normalized crp The incision had fully closed as of 02/20/23  01/01/24 id clinic f/u See a&p   Allergies  Allergen Reactions   Adhesive [Tape] Rash    Paper tape is ok      Outpatient Medications Prior to Visit  Medication Sig Dispense Refill   acetaminophen (TYLENOL) 500 MG tablet Take 1,000 mg by mouth 2 (two) times daily as needed for moderate pain or headache.      aspirin EC 81 MG tablet Take 81 mg by mouth daily.     atorvastatin (LIPITOR) 20 MG tablet Take 1 tablet (20 mg total) by mouth daily. (Patient taking differently: Take 20 mg by mouth at bedtime.) 90 tablet 3   Calcium Carb-Cholecalciferol (CALCIUM + D3 PO) Take 600 mg by mouth daily.     cefadroxil (DURICEF) 500 MG capsule Take 2 capsules (1000 MG total) by mouth 2 (two) times daily for 2 weeks. Then Take 1 tablet (500 MG total) by mouth 2 (two) times daily. (Patient taking differently: Take 1,000 mg by mouth 2 (two) times daily.) 180 capsule 3  oxyCODONE (OXY IR/ROXICODONE) 5 MG immediate release tablet Take 1-2 tablets (5-10 mg total) by mouth every 6 (six) hours as needed for moderate pain (pain score 4-6). 30 tablet 0   solifenacin (VESICARE) 10 MG tablet Take 10 mg by mouth daily.     No facility-administered medications prior to visit.     Social History   Socioeconomic History   Marital status: Married    Spouse name: Not on file   Number of children: Not on file   Years of education: Not on file   Highest education level: Not on file   Occupational History   Not on file  Tobacco Use   Smoking status: Never   Smokeless tobacco: Never  Vaping Use   Vaping status: Never Used  Substance and Sexual Activity   Alcohol use: No   Drug use: No   Sexual activity: Not Currently  Other Topics Concern   Not on file  Social History Narrative   Married, 2 biologic children, 2 adopted, 5 GC.   Lives in [place].  Worked 35 yrs at [employer].   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   No formal exercise.  No T/A/Ds.   Enjoys woodworking.   Social Drivers of Corporate investment banker Strain: Low Risk  (03/26/2023)   Overall Financial Resource Strain (CARDIA)    Difficulty of Paying Living Expenses: Not hard at all  Food Insecurity: No Food Insecurity (12/16/2023)   Hunger Vital Sign    Worried About Running Out of Food in the Last Year: Never true    Ran Out of Food in the Last Year: Never true  Transportation Needs: No Transportation Needs (12/16/2023)   PRAPARE - Administrator, Civil Service (Medical): No    Lack of Transportation (Non-Medical): No  Physical Activity: Sufficiently Active (03/26/2023)   Exercise Vital Sign    Days of Exercise per Week: 7 days    Minutes of Exercise per Session: 30 min  Stress: No Stress Concern Present (03/26/2023)   [employer] of Occupational Health - Occupational Stress Questionnaire    Feeling of Stress : Not at all  Social Connections: Socially Integrated (12/12/2023)   Social Connection and Isolation Panel [NHANES]    Frequency of Communication with Friends and Family: Three times a week    Frequency of Social Gatherings with Friends and Family: Three times a week    Attends Religious Services: More than 4 times per year    Active Member of Clubs or Organizations: Yes    Attends Banker Meetings: 1 to 4 times per year    Marital Status: Married  Catering manager Violence: Not At Risk (12/16/2023)   Humiliation, Afraid, Rape, and Kick  questionnaire    Fear of Current or Ex-Partner: No    Emotionally Abused: No    Physically Abused: No    Sexually Abused: No      Review of Systems  Eyes:  Negative for visual disturbance.      All other ros negative   Objective:    BP 134/74   Pulse 74   Resp 16   Ht 6\' 1"  (1.854 m)   BMI 32.98 kg/m  Nursing note and vital signs reviewed.  Physical Exam     General/constitutional: no distress, pleasant HEENT: Normocephalic, PER, Conj Clear, EOMI, Oropharynx clear Neck supple CV: rrr no mrg Lungs: clear to auscultation, normal respiratory effort Abd: Soft, Nontender Ext: no edema Neuro:  nonfocal MSK: left hip wound thigh where the previous hematoma was drained dressing clean/dry  Skin:    01/01/24  He has dressing on wound and had relayed that his surgeon want to keep it on until a week from now Overall left leg/thigh edema but said improving   Labs: Lab Results  Component Value Date   WBC 5.7 12/14/2023   HGB 10.2 (L) 12/14/2023   HCT 34.1 (L) 12/14/2023   MCV 84.2 12/14/2023   PLT 216 12/14/2023   Last metabolic panel Lab Results  Component Value Date   GLUCOSE 139 (H) 12/13/2023   NA 135 12/13/2023   K 5.0 12/13/2023   CL 103 12/13/2023   CO2 23 12/13/2023   BUN 22 12/13/2023   CREATININE 1.04 12/13/2023   GFRNONAA >60 12/13/2023   CALCIUM 9.2 12/13/2023   PHOS 2.5 05/21/2010   PROT 6.4 12/04/2023   ALBUMIN 3.4 (L) 08/07/2023   BILITOT 0.4 12/04/2023   ALKPHOS 71 08/07/2023   AST 13 12/04/2023   ALT 12 12/04/2023   ANIONGAP 9 12/13/2023      Component Value Date/Time   CRP 37.4 (H) 12/04/2023 0838   CRP 22.1 (H) 10/14/2023 1425   CRP 31.4 (H) 05/20/2023 1011     Micro:  Serology:  Imaging:  Assessment & Plan:   Problem List Items Addressed This Visit   None Visit Diagnoses       Hardware complicating wound infection, subsequent encounter    -  Primary     Infection of prosthetic joint, subsequent encounter               No orders of the defined types were placed in this encounter.      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 Here for id clinic f/u   He is followed by dr Orvan Falconer. Last seen 05/08/22 in clinic. Chart mention 03/2020 had a short course of rifampin combination cefazolin during the most recent I&D prior to this admission.    S/p I&D 01/03/23 this admission. Cx mssa again (grew in 2017, 2019, and 2021). 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 Hardware retained     I am not optimistic in curing this or suppressing this given the history/operative finding   No role for rifampin in his case   Had planned 6 weeks iv cefazolin then transitioned to indefinite suppression on 02/14/23.   ------------ 01/16/23 id assessment  #lab monitoring 4/08 opat labs cbc 5/14/213; cr 0.8; crp 19 (<10)  #prosthetic joint recurrent/chronic infection -- mssa S/p multiple surgery; last washout 01/03/23 with I&D and sinus tract removal -- no hardware exchange  -advise to stop cefadroxil for now while on cefazolin -once done with cefazolin on 02/14/23, can remove picc and start cefadroxil indefinitely  02/21/23  id assessment Labs and clinically looking very good Will continue cefadroxil 1000 mg twice a day for the next 6 months; after that if clinically looking good still will decrease to 500 mg twice a day indefinitely  F/u in 3 months   05/20/23 id assessment Doing well He thinks since he's been taking twice a day high dose  cefadroxil he is more prone to bruising. He has a big hematoma on left buttock from just riding lawn mower He takes asa 81 mg once a day; no herbal supplement; no anticoagulant It would be very unusual if related to cefadroxil, but we can see labs today and check platelet level Tolerating cefadroxil otherwise  Advise to talk to primary care about aspirin use if further hematoma forms    10/14/23 id clinic assessment Patient got admitted 09/19/23 and underwent a left posterior thigh superficial fluid collection I&D presumed seroma. No culture sent I reviewed op note from dr Doneen Poisson. Some necrotic tissue found but no purulence. He remains on cefadroxil twice a day 2 tablet twice a day. As he is at least 6 months out of the initial infection and recent seroma description not appeared to be infectious, will go to 1 tablet twice a day (500 mg bid) Labs today  F/u 3 months  Given so many infection with staph aureus in the past in the same hip, will keep on cefadroxil indefinitely likey 500 twice a day   12/04/23 id assessment Patient decreased dose of cefadroxil to 500 mg bid 10/14/23; he fell 2 weeks ago but didn't remember falling on the left hip. Within a day of the fall he started having redness/blistering at the left lateral hip incision No fever, chill We discussed over the phone and 3 days prior to this visit increased back to 1000 mg twice a day Patient saw dr Magnus Ivan in clinic 4 days ago who was trying to express any pus but nothing was obtained He'll see dr Magnus Ivan next week His redness and blister is improving. 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 and this happened while on suppressive antibitiotics  Await further evolution of cellulitis and hopefully we an avoid more surgery   Labs obtained today  ---------- 01/01/24  Unfortunately with high dose cefadroxil he still needed  debridement; admission 3/7-10 and operative tissue cx continues to grow mrsa (operative note does mention a sinus tract down to the joint; no hardware removed/exchanged) Patient had extensive discussion with dr Magnus Ivan before and again that without hardware removal prognosis is unfavorable but at this time patient has several issues to take care of and not amenable to that He tolerates high dose cefadroxil and will continue for at least 3 more months if not longer with this high dose F/u 4 weeks and will get surveillant labs  Follow-up: Return in about 4 weeks (around 01/29/2024).       Raymondo Band, MD [facility] 01/01/2024, 8:59 AM

## 2024-01-05 ENCOUNTER — Ambulatory Visit (INDEPENDENT_AMBULATORY_CARE_PROVIDER_SITE_OTHER): Admitting: Orthopaedic Surgery

## 2024-01-05 DIAGNOSIS — M009 Pyogenic arthritis, unspecified: Secondary | ICD-10-CM

## 2024-01-05 NOTE — Progress Notes (Signed)
 The patient is here today close 3 weeks status post a repeat I&D of his chronic left hip infection.  On exam all sutures are removed.  I was able to aspirate about 50 cc of seroma from the hip.  I then placed Xeroform over his incision and a new Aquacel dressing.  I want him to change this dressing in a week and then place Bactroban ointment over the wound on a daily basis.  Will see him back in 2 weeks to see how he is doing overall and for wound check.  He feels better overall and denies any significant swelling and denies any pain.

## 2024-01-13 DIAGNOSIS — T8189XA Other complications of procedures, not elsewhere classified, initial encounter: Secondary | ICD-10-CM | POA: Diagnosis not present

## 2024-01-20 ENCOUNTER — Telehealth: Payer: PPO | Admitting: Internal Medicine

## 2024-01-22 ENCOUNTER — Telehealth: Payer: Self-pay

## 2024-01-22 ENCOUNTER — Other Ambulatory Visit: Payer: Self-pay

## 2024-01-22 MED ORDER — CEFADROXIL 500 MG PO CAPS: 1000.0000 mg | ORAL_CAPSULE | Freq: Two times a day (BID) | ORAL | 2 refills | Status: DC

## 2024-01-22 NOTE — Telephone Encounter (Signed)
 Patient called stating that pharmacy wouldn't dispense Cefadroxil due to how prescription was written.   Per Dr.Vu take 2 tablets po BID.    New RX sent to [facility] on [address].    Diedra Sinor [name] Chestnut, CMA

## 2024-01-26 ENCOUNTER — Encounter: Payer: Self-pay | Admitting: Orthopaedic Surgery

## 2024-01-26 ENCOUNTER — Ambulatory Visit (INDEPENDENT_AMBULATORY_CARE_PROVIDER_SITE_OTHER): Admitting: Orthopaedic Surgery

## 2024-01-26 DIAGNOSIS — Z9889 Other specified postprocedural states: Secondary | ICD-10-CM

## 2024-01-26 DIAGNOSIS — M009 Pyogenic arthritis, unspecified: Secondary | ICD-10-CM

## 2024-01-26 NOTE — Progress Notes (Signed)
 The patient is here in follow-up as a relates to a chronic infection involving his left prosthetic hip joint.  He is 6 weeks out from the most recent incision and drainage/irrigation and debridement.  He is followed by the ID service and is on chronic suppressive antibiotics.  He denies any fever and chills.  He said there is only 1 small area of the wound that is draining.  He does have a small opening but overall the wound looks like his granulation tissue and there is no redness or erythema.  There is no purulent drainage.  I did use some silver nitrate on the small area of the wound to hopefully help get this to continue to heal.  He will continue local wound care and we will see him back in a month for a wound check.

## 2024-01-27 ENCOUNTER — Ambulatory Visit: Admitting: Internal Medicine

## 2024-02-05 DIAGNOSIS — T8189XA Other complications of procedures, not elsewhere classified, initial encounter: Secondary | ICD-10-CM | POA: Diagnosis not present

## 2024-02-10 ENCOUNTER — Ambulatory Visit: Admitting: Internal Medicine

## 2024-02-10 ENCOUNTER — Other Ambulatory Visit: Payer: Self-pay

## 2024-02-10 ENCOUNTER — Encounter: Payer: Self-pay | Admitting: Internal Medicine

## 2024-02-10 VITALS — BP 154/75 | HR 55 | Temp 97.9°F | Ht 73.0 in | Wt 255.0 lb

## 2024-02-10 DIAGNOSIS — T8452XD Infection and inflammatory reaction due to internal left hip prosthesis, subsequent encounter: Secondary | ICD-10-CM

## 2024-02-10 DIAGNOSIS — T8450XD Infection and inflammatory reaction due to unspecified internal joint prosthesis, subsequent encounter: Secondary | ICD-10-CM

## 2024-02-10 NOTE — Progress Notes (Signed)
 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   Pain in left hip 09/01/2017   History of revision of total replacement of left hip joint 09/01/2017   Encounter for surveillance of recalled total hip arthroplasty hardware (HCC) 06/07/2016   Status post revision of total hip replacement 06/07/2016   Spinal stenosis, lumbar region, 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   Red blood cell abnormality 02/11/2016   Elevated blood pressure reading without diagnosis of hypertension 07/01/2013   Peripheral neuropathy 06/02/2013   Health maintenance examination 11/26/2011   Prostate cancer (HCC) 05/21/2010   Obesity, Class I, BMI 30-34.9 05/18/2010   Venous (peripheral) insufficiency 05/18/2010   Osteoarthrosis involving lower leg 05/18/2010      Subjective:    Patient ID: Clover Dao, male    DOB: Feb 14, 1946, 78 y.o.   MRN: [id number]  Chief Complaint  Patient presents with   Follow-up    HPI:  ORBIE PICHA is a 78 y.o. 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 Here for id clinic f/u   01/16/23 id  f/u 01/03/23 operative culture mssa Patient doing well Hip incision has one little spot still not closed yet. All sutures intact. There is a lot of serosanguinous drainage and he had to change pad 2-3 times a day depending on activity No pain along the incision or in the groin.  He'll see dr Lucienne Ryder today  He is taking also the cefadroxil  together with cefazolin  which he didn't know he not supposed to   02/21/23 id clinic f/u Doing well on cephadroxil on 02/15/23; picc removed No hip pain Opat labs normalized crp The incision had fully closed as of 02/20/23   02/10/24 id clinic f/u Doing ok, still some drainage/unhealed incision No pain No f/c No n/v/diarrhea No other complaints   Allergies  Allergen Reactions   Adhesive [Tape] Rash    Paper tape is ok      Outpatient Medications Prior to Visit  Medication Sig Dispense Refill   acetaminophen  (TYLENOL ) 500 MG tablet Take 1,000 mg by mouth 2 (two) times daily as needed for moderate pain or headache.      aspirin  EC 81 MG tablet Take 81 mg by mouth daily.     atorvastatin  (LIPITOR) 20 MG tablet Take 1 tablet (20 mg total) by mouth daily. (Patient taking differently: Take 20 mg by mouth at bedtime.) 90 tablet 3   Calcium  Carb-Cholecalciferol (CALCIUM  + D3 PO) Take 600 mg by mouth daily.     cefadroxil  (DURICEF) 500 MG capsule Take 2 capsules (1,000 mg total) by mouth 2 (two) times daily. 120 capsule 2   oxyCODONE  (OXY IR/ROXICODONE ) 5 MG immediate release tablet Take  1-2 tablets (5-10 mg total) by mouth every 6 (six) hours as needed for moderate pain (pain score 4-6). 30 tablet 0   solifenacin (VESICARE) 10 MG tablet Take 10 mg by mouth daily.     No facility-administered medications prior to visit.     Social History   Socioeconomic History   Marital status: Married    Spouse name: Not on file   Number of children: Not on file   Years of education: Not on file   Highest education level: Not on file  Occupational History    Not on file  Tobacco Use   Smoking status: Never   Smokeless tobacco: Never  Vaping Use   Vaping status: Never Used  Substance and Sexual Activity   Alcohol use: No   Drug use: No   Sexual activity: Not Currently  Other Topics Concern   Not on file  Social History Narrative   Married, 2 biologic children, 2 adopted, 5 GC.   Lives in [place].  Worked 35 yrs at [employer].   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   No formal exercise.  No T/A/Ds.   Enjoys woodworking.   Social Drivers of Corporate investment banker Strain: Low Risk  (03/26/2023)   Overall Financial Resource Strain (CARDIA)    Difficulty of Paying Living Expenses: Not hard at all  Food Insecurity: No Food Insecurity (12/16/2023)   Hunger Vital Sign    Worried About Running Out of Food in the Last Year: Never true    Ran Out of Food in the Last Year: Never true  Transportation Needs: No Transportation Needs (12/16/2023)   PRAPARE - Administrator, Civil Service (Medical): No    Lack of Transportation (Non-Medical): No  Physical Activity: Sufficiently Active (03/26/2023)   Exercise Vital Sign    Days of Exercise per Week: 7 days    Minutes of Exercise per Session: 30 min  Stress: No Stress Concern Present (03/26/2023)   [employer] of Occupational Health - Occupational Stress Questionnaire    Feeling of Stress : Not at all  Social Connections: Socially Integrated (12/12/2023)   Social Connection and Isolation Panel [NHANES]    Frequency of Communication with Friends and Family: Three times a week    Frequency of Social Gatherings with Friends and Family: Three times a week    Attends Religious Services: More than 4 times per year    Active Member of Clubs or Organizations: Yes    Attends Banker Meetings: 1 to 4 times per year    Marital Status: Married  Catering manager Violence: Not At Risk (12/16/2023)   Humiliation, Afraid, Rape, and Kick questionnaire    Fear  of Current or Ex-Partner: No    Emotionally Abused: No    Physically Abused: No    Sexually Abused: No      Review of Systems  Eyes:  Negative for visual disturbance.      All other ros negative   Objective:    BP (!) 154/75   Pulse (!) 55   Temp 97.9 F (36.6 C) (Oral)   Ht 6\' 1"  (1.854 m)   Wt 255 lb (115.7 kg)   SpO2 97%   BMI 33.64 kg/m  Nursing note and vital signs reviewed.  Physical Exam     General/constitutional: no distress, pleasant HEENT: Normocephalic, PER, Conj Clear, EOMI, Oropharynx clear Neck supple CV: rrr no mrg Lungs: clear to auscultation, normal respiratory  effort Abd: Soft, Nontender Ext: no edema Neuro: nonfocal MSK: left hip wound thigh where the previous hematoma was drained dressing clean/dry  Skin: 02/10/24 skin Incision with dressing c/d/I   Labs: Lab Results  Component Value Date   WBC 5.7 12/14/2023   HGB 10.2 (L) 12/14/2023   HCT 34.1 (L) 12/14/2023   MCV 84.2 12/14/2023   PLT 216 12/14/2023   Last metabolic panel Lab Results  Component Value Date   GLUCOSE 139 (H) 12/13/2023   NA 135 12/13/2023   K 5.0 12/13/2023   CL 103 12/13/2023   CO2 23 12/13/2023   BUN 22 12/13/2023   CREATININE 1.04 12/13/2023   GFRNONAA >60 12/13/2023   CALCIUM  9.2 12/13/2023   PHOS 2.5 05/21/2010   PROT 6.4 12/04/2023   ALBUMIN  3.4 (L) 08/07/2023   BILITOT 0.4 12/04/2023   ALKPHOS 71 08/07/2023   AST 13 12/04/2023   ALT 12 12/04/2023   ANIONGAP 9 12/13/2023      Component Value Date/Time   CRP 37.4 (H) 12/04/2023 0838   CRP 22.1 (H) 10/14/2023 1425   CRP 31.4 (H) 05/20/2023 1011     Micro:  Serology:  Imaging:  Assessment & Plan:   Problem List Items Addressed This Visit   None Visit Diagnoses       Infection of prosthetic joint, subsequent encounter    -  Primary   Relevant Orders   C-reactive protein   Sedimentation rate           No orders of the defined types were placed in this encounter.       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 Here for id clinic f/u   He is followed by dr Daina Drum. Last seen 05/08/22 in clinic. Chart mention 03/2020 had a short course of rifampin  combination cefazolin  during the most recent I&D prior to this admission.    S/p I&D 01/03/23 this admission. Cx mssa again (grew in 2017, 2019, and 2021). 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 Hardware retained     I am not optimistic in curing this or suppressing this given the history/operative finding   No role for rifampin  in his case   Had planned 6 weeks iv cefazolin  then transitioned to indefinite suppression on 02/14/23.   ------------ 01/16/23 id assessment  #lab monitoring 4/08 opat labs cbc 5/14/213; cr 0.8; crp 19 (<10)  #prosthetic joint recurrent/chronic infection -- mssa S/p multiple surgery; last washout 01/03/23 with I&D and sinus tract removal -- no hardware exchange  -advise to stop cefadroxil  for now while on cefazolin  -once done with cefazolin  on 02/14/23, can remove picc and start cefadroxil  indefinitely  02/21/23 id assessment Labs and clinically looking very good Will continue cefadroxil  1000 mg twice a day for the next 6  months; after that if clinically looking good still will decrease to 500 mg twice a day indefinitely  F/u in 3 months   05/20/23 id assessment Doing well He thinks since he's been taking twice a day high dose cefadroxil  he is more prone to bruising. He has a big hematoma on left buttock from  just riding lawn mower He takes asa 81 mg once a day; no herbal supplement; no anticoagulant It would be very unusual if related to cefadroxil , but we can see labs today and check platelet level Tolerating cefadroxil  otherwise  Advise to talk to primary care about aspirin  use if further hematoma forms    10/14/23 id clinic assessment Patient got admitted 09/19/23 and underwent a left posterior thigh superficial fluid collection I&D presumed seroma. No culture sent I reviewed op note from dr Norberto Bear. Some necrotic tissue found but no purulence. He remains on cefadroxil  twice a day 2 tablet twice a day. As he is at least 6 months out of the initial infection and recent seroma description not appeared to be infectious, will go to 1 tablet twice a day (500 mg bid) Labs today  F/u 3 months  Given so many infection with staph aureus in the past in the same hip, will keep on cefadroxil  indefinitely likey 500 twice a day   12/04/23 id assessment Patient decreased dose of cefadroxil  to 500 mg bid 10/14/23; he fell 2 weeks ago but didn't remember falling on the left hip. Within a day of the fall he started having redness/blistering at the left lateral hip incision No fever, chill We discussed over the phone and 3 days prior to this visit increased back to 1000 mg twice a day Patient saw dr Lucienne Ryder in clinic 4 days ago who was trying to express any pus but nothing was obtained He'll see dr Lucienne Ryder next week His redness and blister is improving. 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 and this happened while on suppressive antibitiotics  Await further evolution of cellulitis and hopefully we an avoid more surgery   Labs obtained today  ---------- 01/01/24  Unfortunately with high dose cefadroxil  he still needed debridement; admission 3/7-10 and operative tissue cx continues to grow mrsa  (operative note does mention a sinus tract down to the joint; no hardware removed/exchanged) Patient had extensive discussion with dr Lucienne Ryder before and again that without hardware removal prognosis is unfavorable but at this time patient has several issues to take care of and not amenable to that He tolerates high dose cefadroxil  and will continue for at least 3 more months if not longer with this high dose F/u 4 weeks and will get surveillant labs    02/10/24 id assessment Overall stable no real concerning sx but discussed that the nonhealing wound and history of chronic mssa pji are in itself bad prognosis Don't know how long we can go until next I&D is needed but do anticipate that  Discuss some possible experiemental phage therapy and ask if patient wants to be referred (I know [facility] in [place] might be doing some)  He'll review information and let me know  For now will continue cefadroxil  1 gram bid  Labs today  F/u 3 months  Follow-up: Return in about 3 months (around 05/12/2024).       Jamesetta Mcbride, MD [facility] 02/10/2024, 8:59 AM

## 2024-02-10 NOTE — Patient Instructions (Signed)
 [email or web address]   Please review the link above about phage therapy   See me again in 3 months. Continue cefadroxil  1000 mg twice a day

## 2024-02-11 LAB — C-REACTIVE PROTEIN: CRP: 10.8 mg/L — ABNORMAL HIGH (ref <8.0)

## 2024-02-11 LAB — SEDIMENTATION RATE: Sed Rate: 11 mm/h (ref 0–20)

## 2024-02-12 DIAGNOSIS — L03116 Cellulitis of left lower limb: Secondary | ICD-10-CM | POA: Diagnosis not present

## 2024-02-12 DIAGNOSIS — T8189XA Other complications of procedures, not elsewhere classified, initial encounter: Secondary | ICD-10-CM | POA: Diagnosis not present

## 2024-02-12 DIAGNOSIS — T8452XA Infection and inflammatory reaction due to internal left hip prosthesis, initial encounter: Secondary | ICD-10-CM | POA: Diagnosis not present

## 2024-02-23 ENCOUNTER — Ambulatory Visit: Admitting: Orthopaedic Surgery

## 2024-02-23 ENCOUNTER — Encounter: Payer: Self-pay | Admitting: Orthopaedic Surgery

## 2024-02-23 DIAGNOSIS — M009 Pyogenic arthritis, unspecified: Secondary | ICD-10-CM

## 2024-02-23 NOTE — Progress Notes (Signed)
 The patient is following up as a relates to his chronic left hip infection.  He is doing well overall and denies any fever or chills.  There is a small area that continues to drain.  Examination wound shows a small area that is having some drainage.  It is not purulent.  There is no redness.  I did address an area of granulation tissue with silver nitrate to see if we can create a healing response.  We will see him back in a month's he is doing overall.  Obviously if things are worsening he knows to let us  know.

## 2024-02-25 DIAGNOSIS — T8189XA Other complications of procedures, not elsewhere classified, initial encounter: Secondary | ICD-10-CM | POA: Diagnosis not present

## 2024-02-25 DIAGNOSIS — L03116 Cellulitis of left lower limb: Secondary | ICD-10-CM | POA: Diagnosis not present

## 2024-03-09 DIAGNOSIS — T8189XA Other complications of procedures, not elsewhere classified, initial encounter: Secondary | ICD-10-CM | POA: Diagnosis not present

## 2024-03-09 DIAGNOSIS — L03116 Cellulitis of left lower limb: Secondary | ICD-10-CM | POA: Diagnosis not present

## 2024-03-11 DIAGNOSIS — Z8546 Personal history of malignant neoplasm of prostate: Secondary | ICD-10-CM | POA: Diagnosis not present

## 2024-03-11 LAB — PSA: PSA: <0.015

## 2024-03-11 LAB — TESTOSTERONE: Testosterone: 19.1

## 2024-03-18 DIAGNOSIS — N401 Enlarged prostate with lower urinary tract symptoms: Secondary | ICD-10-CM | POA: Diagnosis not present

## 2024-03-18 DIAGNOSIS — T8189XA Other complications of procedures, not elsewhere classified, initial encounter: Secondary | ICD-10-CM | POA: Diagnosis not present

## 2024-03-18 DIAGNOSIS — Z8546 Personal history of malignant neoplasm of prostate: Secondary | ICD-10-CM | POA: Diagnosis not present

## 2024-03-18 DIAGNOSIS — R3915 Urgency of urination: Secondary | ICD-10-CM | POA: Diagnosis not present

## 2024-03-18 DIAGNOSIS — L03116 Cellulitis of left lower limb: Secondary | ICD-10-CM | POA: Diagnosis not present

## 2024-03-22 ENCOUNTER — Ambulatory Visit (INDEPENDENT_AMBULATORY_CARE_PROVIDER_SITE_OTHER): Admitting: Orthopaedic Surgery

## 2024-03-22 ENCOUNTER — Encounter: Payer: Self-pay | Admitting: Orthopaedic Surgery

## 2024-03-22 DIAGNOSIS — T8189XA Other complications of procedures, not elsewhere classified, initial encounter: Secondary | ICD-10-CM | POA: Diagnosis not present

## 2024-03-22 DIAGNOSIS — L03116 Cellulitis of left lower limb: Secondary | ICD-10-CM | POA: Diagnosis not present

## 2024-03-22 DIAGNOSIS — G8929 Other chronic pain: Secondary | ICD-10-CM | POA: Diagnosis not present

## 2024-03-22 DIAGNOSIS — M25561 Pain in right knee: Secondary | ICD-10-CM

## 2024-03-22 DIAGNOSIS — M009 Pyogenic arthritis, unspecified: Secondary | ICD-10-CM

## 2024-03-22 MED ORDER — LIDOCAINE HCL 1 % IJ SOLN
3.0000 mL | INTRAMUSCULAR | Status: AC | PRN
Start: 2024-03-22 — End: 2024-03-22

## 2024-03-22 MED ORDER — METHYLPREDNISOLONE ACETATE 40 MG/ML IJ SUSP: 40.0000 mg | INTRAMUSCULAR | Status: AC | PRN

## 2024-03-22 NOTE — Progress Notes (Signed)
 The patient comes in for continued follow-up of a left hip chronic periprosthetic joint infection.  He does report draining from his wound still and he changes the dressing about twice a day.  The wound still shows signs of healing with no erythema.  I did place a silver nitrate stick along the near granulation tissue to see if we can get this to heal over and then place some Dermabond.  I would like to see him back in 2 weeks and at that visit I would like to consider using lidocaine  to anesthetize both sides of the wound and then ellipse it out with a scalpel and closed with nylon suture.  He has been having chronic right knee pain and does wish for steroid injection in his right knee today.  I agreed with this and he tolerated that injection well.  Will see him back in 2 weeks but no x-rays are needed.     Procedure Note  Patient: [name]             Date of Birth: [birth year]           MRN: [id number]             Visit Date: 03/22/2024  Procedures: Visit Diagnoses:  1. 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 Chronic pain of right knee     Large Joint Inj: R knee on 03/22/2024 4:00 PM Indications: diagnostic evaluation and pain Details: 22 G 1.5 in needle, superolateral approach  Arthrogram: No  Medications: 3 mL lidocaine  1 %; 40 mg methylPREDNISolone  acetate 40 MG/ML Outcome: tolerated well, no immediate complications Procedure, treatment alternatives, risks and benefits explained, specific risks discussed. Consent was given by the patient. Immediately prior to procedure a time out was called to verify the correct patient, procedure, equipment, support staff and site/side marked as required. Patient was prepped and draped in the usual sterile fashion.

## 2024-03-31 ENCOUNTER — Ambulatory Visit

## 2024-03-31 VITALS — Ht 73.0 in | Wt 255.0 lb

## 2024-03-31 DIAGNOSIS — Z0000 Encounter for general adult medical examination without abnormal findings: Secondary | ICD-10-CM | POA: Diagnosis not present

## 2024-03-31 NOTE — Patient Instructions (Signed)
 [name] , Thank you for taking time to come for your Medicare Wellness Visit. I appreciate your ongoing commitment to your health goals. Please review the following plan we discussed and let me know if I can assist you in the future.   Screening recommendations/referrals: Colonoscopy: no longer required Recommended yearly ophthalmology/optometry visit for glaucoma screening and checkup Recommended yearly dental visit for hygiene and checkup  Vaccinations: Influenza vaccine: up to date Pneumococcal vaccine: up to date Tdap vaccine: up to date      Preventive Care 65 Years and Older, Male Preventive care refers to lifestyle choices and visits with your health care provider that can promote health and wellness. What does preventive care include? A yearly physical exam. This is also called an annual well check. Dental exams once or twice a year. Routine eye exams. Ask your health care provider how often you should have your eyes checked. Personal lifestyle choices, including: Daily care of your teeth and gums. Regular physical activity. Eating a healthy diet. Avoiding tobacco and drug use. Limiting alcohol use. Practicing safe sex. Taking low doses of aspirin  every day. Taking vitamin and mineral supplements as recommended by your health care provider. What happens during an annual well check? The services and screenings done by your health care provider during your annual well check will depend on your age, overall health, lifestyle risk factors, and family history of disease. Counseling  Your health care provider may ask you questions about your: Alcohol use. Tobacco use. Drug use. Emotional well-being. Home and relationship well-being. Sexual activity. Eating habits. History of falls. Memory and ability to understand (cognition). Work and work Astronomer. Screening  You may have the following tests or measurements: Height, weight, and BMI. Blood pressure. Lipid and  cholesterol levels. These may be checked every 5 years, or more frequently if you are over 59 years old. Skin check. Lung cancer screening. You may have this screening every year starting at age 46 if you have a 30-pack-year history of smoking and currently smoke or have quit within the past 15 years. Fecal occult blood test (FOBT) of the stool. You may have this test every year starting at age 64. Flexible sigmoidoscopy or colonoscopy. You may have a sigmoidoscopy every 5 years or a colonoscopy every 10 years starting at age 72. Prostate cancer screening. Recommendations will vary depending on your family history and other risks. Hepatitis C blood test. Hepatitis B blood test. Sexually transmitted disease (STD) testing. Diabetes screening. This is done by checking your blood sugar (glucose) after you have not eaten for a while (fasting). You may have this done every 1-3 years. Abdominal aortic aneurysm (AAA) screening. You may need this if you are a current or former smoker. Osteoporosis. You may be screened starting at age 44 if you are at high risk. Talk with your health care provider about your test results, treatment options, and if necessary, the need for more tests. Vaccines  Your health care provider may recommend certain vaccines, such as: Influenza vaccine. This is recommended every year. Tetanus, diphtheria, and acellular pertussis (Tdap, Td) vaccine. You may need a Td booster every 10 years. Zoster vaccine. You may need this after age 52. Pneumococcal 13-valent conjugate (PCV13) vaccine. One dose is recommended after age 28. Pneumococcal polysaccharide (PPSV23) vaccine. One dose is recommended after age 80. Talk to your health care provider about which screenings and vaccines you need and how often you need them. This information is not intended to replace advice given to  you by your health care provider. Make sure you discuss any questions you have with your health care  provider. Document Released: 10/20/2015 Document Revised: 06/12/2016 Document Reviewed: 07/25/2015 Elsevier Interactive Patient Education  2017 [employer].  Fall Prevention in the Home Falls can cause injuries. They can happen to people of all ages. There are many things you can do to make your home safe and to help prevent falls. What can I do on the outside of my home? Regularly fix the edges of walkways and driveways and fix any cracks. Remove anything that might make you trip as you walk through a door, such as a raised step or threshold. Trim any bushes or trees on the path to your home. Use bright outdoor lighting. Clear any walking paths of anything that might make someone trip, such as rocks or tools. Regularly check to see if handrails are loose or broken. Make sure that both sides of any steps have handrails. Any raised decks and porches should have guardrails on the edges. Have any leaves, snow, or ice cleared regularly. Use sand or salt on walking paths during winter. Clean up any spills in your garage right away. This includes oil or grease spills. What can I do in the bathroom? Use night lights. Install grab bars by the toilet and in the tub and shower. Do not use towel bars as grab bars. Use non-skid mats or decals in the tub or shower. If you need to sit down in the shower, use a plastic, non-slip stool. Keep the floor dry. Clean up any water  that spills on the floor as soon as it happens. Remove soap buildup in the tub or shower regularly. Attach bath mats securely with double-sided non-slip rug tape. Do not have throw rugs and other things on the floor that can make you trip. What can I do in the bedroom? Use night lights. Make sure that you have a light by your bed that is easy to reach. Do not use any sheets or blankets that are too big for your bed. They should not hang down onto the floor. Have a firm chair that has side arms. You can use this for support while  you get dressed. Do not have throw rugs and other things on the floor that can make you trip. What can I do in the kitchen? Clean up any spills right away. Avoid walking on wet floors. Keep items that you use a lot in easy-to-reach places. If you need to reach something above you, use a strong step stool that has a grab bar. Keep electrical cords out of the way. Do not use floor polish or wax that makes floors slippery. If you must use wax, use non-skid floor wax. Do not have throw rugs and other things on the floor that can make you trip. What can I do with my stairs? Do not leave any items on the stairs. Make sure that there are handrails on both sides of the stairs and use them. Fix handrails that are broken or loose. Make sure that handrails are as long as the stairways. Check any carpeting to make sure that it is firmly attached to the stairs. Fix any carpet that is loose or worn. Avoid having throw rugs at the top or bottom of the stairs. If you do have throw rugs, attach them to the floor with carpet tape. Make sure that you have a light switch at the top of the stairs and the bottom of the stairs.  If you do not have them, ask someone to add them for you. What else can I do to help prevent falls? Wear shoes that: Do not have high heels. Have rubber bottoms. Are comfortable and fit you well. Are closed at the toe. Do not wear sandals. If you use a stepladder: Make sure that it is fully opened. Do not climb a closed stepladder. Make sure that both sides of the stepladder are locked into place. Ask someone to hold it for you, if possible. Clearly mark and make sure that you can see: Any grab bars or handrails. First and last steps. Where the edge of each step is. Use tools that help you move around (mobility aids) if they are needed. These include: Canes. Walkers. Scooters. Crutches. Turn on the lights when you go into a dark area. Replace any light bulbs as soon as they burn  out. Set up your furniture so you have a clear path. Avoid moving your furniture around. If any of your floors are uneven, fix them. If there are any pets around you, be aware of where they are. Review your medicines with your doctor. Some medicines can make you feel dizzy. This can increase your chance of falling. Ask your doctor what other things that you can do to help prevent falls. This information is not intended to replace advice given to you by your health care provider. Make sure you discuss any questions you have with your health care provider. Document Released: 07/20/2009 Document Revised: 02/29/2016 Document Reviewed: 10/28/2014 Elsevier Interactive Patient Education  2017 [employer].

## 2024-03-31 NOTE — Progress Notes (Signed)
 Subjective:   [name] is a 78 y.o. male who presents for Medicare Annual/Subsequent preventive examination.  Visit Complete: Virtual I connected with  [name] on 03/31/24 by a audio enabled telemedicine application and verified that I am speaking with the correct person using two identifiers.  Patient Location: Home  Provider Location: Home Office  I discussed the limitations of evaluation and management by telemedicine. The patient expressed understanding and agreed to proceed.  Vital Signs: Because this visit was a virtual/telehealth visit, some criteria may be missing or patient reported. Any vitals not documented were not able to be obtained and vitals that have been documented are patient reported.  Cardiac Risk Factors include: advanced age (>16men, >73 women);male gender;family history of premature cardiovascular disease;obesity (BMI >30kg/m2)     Objective:    Today's Vitals   03/31/24 1100  Weight: 255 lb (115.7 kg)  Height: 6' 1 (1.854 m)   Body mass index is 33.64 kg/m.     03/31/2024   10:59 AM 12/11/2023    1:40 PM 09/19/2023    5:00 PM 09/17/2023   10:14 AM 08/15/2023    6:00 PM 08/15/2023   12:14 PM 08/07/2023    8:37 AM  Advanced Directives  Does Patient Have a Medical Advance Directive? Yes Yes Yes Yes Yes Yes Yes  Type of Estate agent of [employer] Power of [place];Living will Healthcare Power of [place];Living will  Healthcare Power of [place];Living will Healthcare Power of [place];Living will Healthcare Power of [place];Living will  Does patient want to make changes to medical advance directive?   No - Patient declined  No - Patient declined No - Patient declined No - Patient declined  Copy of Healthcare Power of Attorney in Chart? Yes - validated most recent copy scanned in chart (See row information)    No - copy requested Yes - validated most recent copy scanned in chart (See row information) No -  copy requested    Current Medications (verified) Outpatient Encounter Medications as of 03/31/2024  Medication Sig   acetaminophen  (TYLENOL ) 500 MG tablet Take 1,000 mg by mouth 2 (two) times daily as needed for moderate pain or headache.    aspirin  EC 81 MG tablet Take 81 mg by mouth daily.   atorvastatin  (LIPITOR) 20 MG tablet Take 1 tablet (20 mg total) by mouth daily.   Calcium  Carb-Cholecalciferol (CALCIUM  + D3 PO) Take 600 mg by mouth daily.   cefadroxil  (DURICEF) 500 MG capsule Take 2 capsules (1,000 mg total) by mouth 2 (two) times daily.   oxyCODONE  (OXY IR/ROXICODONE ) 5 MG immediate release tablet Take 1-2 tablets (5-10 mg total) by mouth every 6 (six) hours as needed for moderate pain (pain score 4-6).   solifenacin (VESICARE) 10 MG tablet Take 10 mg by mouth daily.   No facility-administered encounter medications on file as of 03/31/2024.    Allergies (verified) Adhesive [tape]   History: Past Medical History:  Diagnosis Date   Anterior dislocation of right shoulder 09/2013   Reduced in ED under sedation.  Dislocated posteriorly 2015, ? partial RC tear, has f/u planned with Dr. Addie--? surgery? possible plan for 2016.   Blood donor, whole blood    Has donated approx 30 gallons in his lifetime   Chronic left hip pain    Severe DJD.  THA 2008.  Left acetabular revision in 2017 AND 2019.   Chronic pain syndrome    Candidate for spinal cord stimulator (03/2017)   Chronic  renal insufficiency, stage II (mild)    Chronic venous insufficiency    +compression hose   DDD (degenerative disc disease), lumbar 2017   Dr. Alvah ortho.  He is now wearing a new leg brace.  Has had ESI and will get another.   Diverticulosis    +redundant colon   DVT (deep venous thrombosis) (HCC) 06/14/2016   provoked, bilateral LL's; in post-op setting s/p hip surgery.  Xarelto  x 67mo at full dosing, then 6 more months at 1/2 dosing, then stopped xarelto .     Foot drop, left    s/p hip  fracture 1969   History of blood transfusion    Hx of adenomatous colonic polyps    polypectomy 2007; 2012; 2015; 05/2019; recall 18yrs   Hypercholesterolemia 02/2017   Recommended statin 02/10/17 but pt declined, wanted to do TLC trial first. Rec'd statin 04/2020   IFG (impaired fasting glucose)    04/2023 fasting 111, a1c 5.7%   Neuropathy    Osteoarthritis    knees and hips primarily   Phlebitis    Post laminectomy syndrome    improved with spinal cord stim-->stim to be removed summer/fall 2019.   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 PSA 0.16 Feb 2020 urol f/u. [id number] Aug 2021 urol. <0.015 02/2021.   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; 02/2020   MSSA; admitted for I&D 02/11/20->plan for longterm IV abx after   Urge incontinence    vesicare helpful   Past Surgical History:  Procedure Laterality Date   ANTERIOR HIP REVISION Left 06/07/2016   Procedure: LEFT ANTERIOR HIP ACETABULAR REVISION;  Surgeon: [name], MD;  Location: [facility];  Service: Orthopedics;  Laterality: Left;   ANTERIOR HIP REVISION Left 10/09/2017   Procedure: Left hip acetabular revision;  Surgeon: Poli Lonni CINDERELLA, MD;  Location: [facility];  Service: Orthopedics;  Laterality: Left;   bilateral carpal tunnel release  2003   COLONOSCOPY  2007;2012;2015;05/2019   04/12/2014 Tubular adenoma x 1; 05/2019 adenomatous polyp; recall 5 yrs.   FRACTURE SURGERY Left 1969   hip, traction hospitalized for 6 weeks   INCISION AND DRAINAGE Left 08/15/2023   Procedure: INCISION AND DRAINAGE OF SEROMA LEFT THIGH;  Surgeon: Poli Lonni CINDERELLA, MD;  Location: [facility];  Service: Orthopedics;  Laterality: Left;   INCISION AND DRAINAGE Left 09/19/2023   Procedure: INCISION AND DRAINAGE LEFT THIGH WITH WOUND VAC PLACEMENT;  Surgeon: Poli Lonni CINDERELLA, MD;  Location: [facility];  Service:  Orthopedics;  Laterality: Left;   INCISION AND DRAINAGE HIP Left 02/11/2020   Procedure: IRRIGATION AND DEBRIDEMENT LEFT HIP WITH ANTIBIOTIC SPACER PLACEMENT;  Surgeon: Poli Lonni CINDERELLA, MD;  Location: [facility];  Service: Orthopedics;  Laterality: Left;   INCISION AND DRAINAGE HIP Left 01/03/2023   Procedure: IRRIGATION AND DEBRIDEMENT LEFT HIP;  Surgeon: Poli Lonni CINDERELLA, MD;  Location: [facility];  Service: Orthopedics;  Laterality: Left;   INCISION AND DRAINAGE HIP Left 12/12/2023   Procedure: IRRIGATION AND DEBRIDEMENT HIP;  Surgeon: Poli Lonni CINDERELLA, MD;  Location: [facility];  Service: Orthopedics;  Laterality: Left;  Irrigation and Debridement left hip   LUMBAR LAMINECTOMY/DECOMPRESSION MICRODISCECTOMY Left 03/28/2016   Patient denies: Procedure: Left Lumbar three-four Laminoforaminotomy;  Surgeon: Fairy Levels, MD;  Location: [facility];  Service: Neurosurgery;  Laterality: Left;  left   POLYPECTOMY     RADIOACTIVE SEED IMPLANT  2012  ROTATOR CUFF REPAIR  2006   right   SHOULDER ARTHROSCOPY Left    SPINAL CORD STIMULATOR IMPLANT  06/2017   removed a year after was putted in.   SPINAL CORD STIMULATOR REMOVAL     TOTAL HIP ARTHROPLASTY  2008   left ([facility] [employer])   VASECTOMY     Family History  Problem Relation Age of Onset   Lung cancer Mother    Heart disease Father    Colon cancer Neg Hx    Esophageal cancer Neg Hx    Rectal cancer Neg Hx    Stomach cancer Neg Hx    Colon polyps Neg Hx    Social History   Socioeconomic History   Marital status: Married    Spouse name: Not on file   Number of children: Not on file   Years of education: Not on file   Highest education level: Not on file  Occupational History   Not on file  Tobacco Use   Smoking status: Never   Smokeless tobacco: Never  Vaping Use   Vaping status: Never Used  Substance and Sexual Activity   Alcohol use: No   Drug use: No   Sexual activity: Not Currently  Other Topics Concern   Not  on file  Social History Narrative   Married, 2 biologic children, 2 adopted, 5 GC.   Lives in [place].  Worked 35 yrs at [employer].   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   No formal exercise.  No T/A/Ds.   Enjoys woodworking.   Social Drivers of Corporate investment banker Strain: Low Risk  (03/31/2024)   Overall Financial Resource Strain (CARDIA)    Difficulty of Paying Living Expenses: Not hard at all  Food Insecurity: No Food Insecurity (03/31/2024)   Hunger Vital Sign    Worried About Running Out of Food in the Last Year: Never true    Ran Out of Food in the Last Year: Never true  Transportation Needs: No Transportation Needs (03/31/2024)   PRAPARE - Administrator, Civil Service (Medical): No    Lack of Transportation (Non-Medical): No  Physical Activity: Insufficiently Active (03/31/2024)   Exercise Vital Sign    Days of Exercise per Week: 4 days    Minutes of Exercise per Session: 30 min  Stress: No Stress Concern Present (03/31/2024)   [employer] of Occupational Health - Occupational Stress Questionnaire    Feeling of Stress: Not at all  Social Connections: Socially Integrated (03/31/2024)   Social Connection and Isolation Panel    Frequency of Communication with Friends and Family: Three times a week    Frequency of Social Gatherings with Friends and Family: More than three times a week    Attends Religious Services: More than 4 times per year    Active Member of [employer] or Organizations: Yes    Attends Engineer, structural: More than 4 times per year    Marital Status: Married    Tobacco Counseling Counseling given: Not Answered   Clinical Intake:  Pre-visit preparation completed: Yes  Pain : No/denies pain     Diabetes: No  How often do you need to have someone help you when you read instructions, pamphlets, or other written materials from your doctor or pharmacy?: 1 - Never  Interpreter Needed?:  No  Information entered by :: Mliss Graff LPN   Activities of Daily Living    03/31/2024   11:01 AM 12/11/2023  1:41 PM  In your present state of health, do you have any difficulty performing the following activities:  Hearing? 0   Vision? 0   Difficulty concentrating or making decisions? 0   Walking or climbing stairs? 0   Dressing or bathing? 0   Doing errands, shopping? 0 0  Preparing Food and eating ? N   Using the Toilet? N   In the past six months, have you accidently leaked urine? N   Do you have problems with loss of bowel control? N   Managing your Medications? N   Managing your Finances? N   Housekeeping or managing your Housekeeping? N     Patient Care Team: Candise Aleene DEL, MD as PCP - General (Family Medicine) Nieves Cough, MD as Consulting Physician (Urology) Addie Cordella Hamilton, MD as Consulting Physician (Orthopedic Surgery) Vernetta Lonni GRADE, MD as Consulting Physician (Orthopedic Surgery) Unice Pac, MD as Consulting Physician (Neurosurgery) Corina Norleen SAUNDERS, PsyD as Consulting Physician (Physical Medicine and Rehabilitation) Mindi Mt, MD (Inactive) as Consulting Physician (Anesthesiology) Abran Norleen SAILOR, MD as Consulting Physician (Gastroenterology)  Indicate any recent Medical Services you may have received from other than [facility] providers in the past year (date may be approximate).     Assessment:   This is a routine wellness examination for [name].  Hearing/Vision screen Hearing Screening - Comments:: No trouble hearing Vision Screening - Comments:: Up to date miller   Goals Addressed             This Visit's Progress    Patient Stated       Stay out of hospital       Depression Screen    03/31/2024   11:02 AM 01/01/2024    8:45 AM 05/05/2023    9:17 AM 03/26/2023    2:10 PM 02/21/2023    9:41 AM 01/16/2023   10:52 AM 01/03/2022    3:06 PM  PHQ 2/9 Scores  PHQ - 2 Score 0 0 0 0 0 0 0  PHQ- 9 Score 3  0         Fall Risk    03/31/2024   10:57 AM 02/10/2024    8:52 AM 01/01/2024    8:45 AM 12/04/2023    8:36 AM 05/05/2023    9:17 AM  Fall Risk   Falls in the past year? 0 1 1 1  0  Number falls in past yr: 0 0 0 0   Injury with Fall? 0 0 1 1   Risk for fall due to :  Impaired balance/gait;Impaired mobility  History of fall(s);Impaired balance/gait;Impaired mobility No Fall Risks  Follow up Falls evaluation completed;Education provided;Falls prevention discussed Falls evaluation completed  Falls evaluation completed Falls evaluation completed    MEDICARE RISK AT HOME: Medicare Risk at Home Any stairs in or around the home?: Yes If so, are there any without handrails?: No Home free of loose throw rugs in walkways, pet beds, electrical cords, etc?: Yes Adequate lighting in your home to reduce risk of falls?: Yes Life alert?: No Use of a cane, walker or w/c?: Yes Grab bars in the bathroom?: Yes Shower chair or bench in shower?: Yes Elevated toilet seat or a handicapped toilet?: Yes  TIMED UP AND GO:  Was the test performed?  No    Cognitive Function:        03/31/2024   10:59 AM 03/26/2023    2:14 PM 01/03/2022    3:09 PM  6CIT Screen  What Year? 0 points  0 points 0 points  What month? 0 points 0 points 0 points  What time? 0 points 0 points 0 points  Count back from 20 0 points 0 points 0 points  Months in reverse 0 points 0 points 0 points  Repeat phrase 0 points 0 points 0 points  Total Score 0 points 0 points 0 points    Immunizations Immunization History  Administered Date(s) Administered   Fluad Quad(high Dose 65+) 08/01/2020   Influenza Split 06/08/2011, 07/07/2012   Influenza, High Dose Seasonal PF 07/20/2014, 08/03/2015, 07/11/2016, 07/03/2017, 07/19/2019   Influenza-Unspecified 07/21/2013, 07/03/2017, 07/07/2021   Moderna SARS-COV2 Booster Vaccination 10/12/2020   PFIZER(Purple Top)SARS-COV-2 Vaccination 12/03/2019, 12/28/2019   Pfizer Covid-19 Vaccine Bivalent Booster  108yrs & up 09/11/2021   Pneumococcal Conjugate-13 12/10/2013   Pneumococcal Polysaccharide-23 05/21/2010   Td 01/06/2004   Tdap 11/25/2012, 05/07/2023   Zoster Recombinant(Shingrix) 05/07/2023   Zoster, Live 11/26/2011    TDAP status: Up to date  Flu Vaccine status: Up to date  Pneumococcal vaccine status: Up to date  Covid-19 vaccine status: Declined, Education has been provided regarding the importance of this vaccine but patient still declined. Advised may receive this vaccine at local pharmacy or Health Dept.or vaccine clinic. Aware to provide a copy of the vaccination record if obtained from local pharmacy or Health Dept. Verbalized acceptance and understanding.  Qualifies for Shingles Vaccine? Yes   Zostavax completed No   Shingrix Completed?: No.    Education has been provided regarding the importance of this vaccine. Patient has been advised to call insurance company to determine out of pocket expense if they have not yet received this vaccine. Advised may also receive vaccine at local pharmacy or Health Dept. Verbalized acceptance and understanding.  Screening Tests Health Maintenance  Topic Date Due   Hepatitis C Screening  Never done   Zoster Vaccines- Shingrix (2 of 2) 07/02/2023   Colonoscopy  05/18/2024   INFLUENZA VACCINE  05/07/2024   Medicare Annual Wellness (AWV)  03/31/2025   DTaP/Tdap/Td (4 - Td or Tdap) 05/06/2033   Pneumococcal Vaccine: 50+ Years  Completed   Hepatitis B Vaccines  Aged Out   HPV VACCINES  Aged Out   Meningococcal B Vaccine  Aged Out   COVID-19 Vaccine  Discontinued    Health Maintenance  Health Maintenance Due  Topic Date Due   Hepatitis C Screening  Never done   Zoster Vaccines- Shingrix (2 of 2) 07/02/2023   Colonoscopy  05/18/2024    Colorectal cancer screening: No longer required.   Lung Cancer Screening: (Low Dose CT Chest recommended if Age 51-80 years, 20 pack-year currently smoking OR have quit w/in 15years.) does not  qualify.   Lung Cancer Screening Referral:   Additional Screening:  Hepatitis C Screening   never done  Vision Screening: Recommended annual ophthalmology exams for early detection of glaucoma and other disorders of the eye. Is the patient up to date with their annual eye exam?  Yes  Who is the provider or what is the name of the office in which the patient attends annual eye exams? [facility] If pt is not established with a provider, would they like to be referred to a provider to establish care? No .   Dental Screening: Recommended annual dental exams for proper oral hygiene    Community Resource Referral / Chronic Care Management: CRR required this visit?  No   CCM required this visit?  No     Plan:     I have  personally reviewed and noted the following in the patient's chart:   Medical and social history Use of alcohol, tobacco or illicit drugs  Current medications and supplements including opioid prescriptions. Patient is currently taking opioid prescriptions. Information provided to patient regarding non-opioid alternatives. Patient advised to discuss non-opioid treatment plan with their provider. Functional ability and status Nutritional status Physical activity Advanced directives List of other physicians Hospitalizations, surgeries, and ER visits in previous 12 months Vitals Screenings to include cognitive, depression, and falls Referrals and appointments  In addition, I have reviewed and discussed with patient certain preventive protocols, quality metrics, and best practice recommendations. A written personalized care plan for preventive services as well as general preventive health recommendations were provided to patient.     Mliss Graff, LPN   [id number]   After Visit Summary: (MyChart) Due to this being a telephonic visit, the after visit summary with patients personalized plan was offered to patient via MyChart   Nurse Notes:

## 2024-04-01 DIAGNOSIS — L03116 Cellulitis of left lower limb: Secondary | ICD-10-CM | POA: Diagnosis not present

## 2024-04-01 DIAGNOSIS — T8189XA Other complications of procedures, not elsewhere classified, initial encounter: Secondary | ICD-10-CM | POA: Diagnosis not present

## 2024-04-05 ENCOUNTER — Ambulatory Visit (INDEPENDENT_AMBULATORY_CARE_PROVIDER_SITE_OTHER): Admitting: Orthopaedic Surgery

## 2024-04-05 ENCOUNTER — Encounter: Payer: Self-pay | Admitting: Orthopaedic Surgery

## 2024-04-05 DIAGNOSIS — M009 Pyogenic arthritis, unspecified: Secondary | ICD-10-CM

## 2024-04-05 NOTE — Progress Notes (Signed)
 The patient came in today with continued drainage from a chronic left total hip infection and the incision that has had some breakdown with time.  It is actually gotten smaller.  He does change the dressing twice daily.  There is no redness at all.  He still denies any fever and chills.  I did clean the area with Betadine  and alcohol and after anesthetizing with lidocaine  I ellipsed out the small wound and then closed it with several interrupted 3-0 nylon sutures.  Will keep this area clean and dry and we will leave the sutures in for about 3 weeks to see how he is doing overall on that visit.  He agrees with the treatment plan.

## 2024-04-17 ENCOUNTER — Other Ambulatory Visit: Payer: Self-pay | Admitting: Family Medicine

## 2024-04-19 ENCOUNTER — Other Ambulatory Visit: Payer: Self-pay

## 2024-04-19 MED ORDER — CEFADROXIL 500 MG PO CAPS: 1000.0000 mg | ORAL_CAPSULE | Freq: Two times a day (BID) | ORAL | 1 refills | Status: DC

## 2024-04-25 DIAGNOSIS — T8189XA Other complications of procedures, not elsewhere classified, initial encounter: Secondary | ICD-10-CM | POA: Diagnosis not present

## 2024-04-25 DIAGNOSIS — L03116 Cellulitis of left lower limb: Secondary | ICD-10-CM | POA: Diagnosis not present

## 2024-04-28 ENCOUNTER — Encounter: Payer: Self-pay | Admitting: Orthopaedic Surgery

## 2024-04-28 ENCOUNTER — Ambulatory Visit (INDEPENDENT_AMBULATORY_CARE_PROVIDER_SITE_OTHER): Admitting: Orthopaedic Surgery

## 2024-04-28 DIAGNOSIS — M009 Pyogenic arthritis, unspecified: Secondary | ICD-10-CM

## 2024-04-28 NOTE — Progress Notes (Signed)
 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  On exam the wound is still showing some chronic drainage.  I did remove the previous sutures that we have placed in the office.  I then used some silver nitrate sticks and Dermabond to see if we can get this to still try to heal up which he understands may be quite difficult.  Will see him back in a month to see how he is doing overall.  I may even have my partner Dr. Harden take a look at the wound at some point to see if there is anything he has to offer to try to get this to heal enough.

## 2024-05-06 ENCOUNTER — Encounter: Payer: Self-pay | Admitting: Family Medicine

## 2024-05-06 ENCOUNTER — Ambulatory Visit (INDEPENDENT_AMBULATORY_CARE_PROVIDER_SITE_OTHER): Payer: PPO | Admitting: Family Medicine

## 2024-05-06 VITALS — BP 133/75 | HR 57 | Temp 98.2°F | Ht 72.25 in | Wt 256.4 lb

## 2024-05-06 DIAGNOSIS — R7301 Impaired fasting glucose: Secondary | ICD-10-CM | POA: Diagnosis not present

## 2024-05-06 DIAGNOSIS — E7800 Pure hypercholesterolemia, unspecified: Secondary | ICD-10-CM | POA: Diagnosis not present

## 2024-05-06 DIAGNOSIS — Z0000 Encounter for general adult medical examination without abnormal findings: Secondary | ICD-10-CM

## 2024-05-06 LAB — CBC WITH DIFFERENTIAL/PLATELET
Basophils Absolute: 0 K/uL (ref 0.0–0.1)
Basophils Relative: 0.7 % (ref 0.0–3.0)
Eosinophils Absolute: 0.2 K/uL (ref 0.0–0.7)
Eosinophils Relative: 3.8 % (ref 0.0–5.0)
HCT: 41.5 % (ref 39.0–52.0)
Hemoglobin: 13 g/dL (ref 13.0–17.0)
Lymphocytes Relative: 37.7 % (ref 12.0–46.0)
Lymphs Abs: 2.1 K/uL (ref 0.7–4.0)
MCHC: 31.4 g/dL (ref 30.0–36.0)
MCV: 75.5 fl — ABNORMAL LOW (ref 78.0–100.0)
Monocytes Absolute: 0.6 K/uL (ref 0.1–1.0)
Monocytes Relative: 9.9 % (ref 3.0–12.0)
Neutro Abs: 2.7 K/uL (ref 1.4–7.7)
Neutrophils Relative %: 47.9 % (ref 43.0–77.0)
Platelets: 251 K/uL (ref 150.0–400.0)
RBC: 5.5 Mil/uL (ref 4.22–5.81)
RDW: 20.5 % — ABNORMAL HIGH (ref 11.5–15.5)
WBC: 5.7 K/uL (ref 4.0–10.5)

## 2024-05-06 LAB — COMPREHENSIVE METABOLIC PANEL WITH GFR
ALT: 11 U/L (ref 0–53)
AST: 12 U/L (ref 0–37)
Albumin: 3.9 g/dL (ref 3.5–5.2)
Alkaline Phosphatase: 91 U/L (ref 39–117)
BUN: 17 mg/dL (ref 6–23)
CO2: 29 meq/L (ref 19–32)
Calcium: 9.9 mg/dL (ref 8.4–10.5)
Chloride: 106 meq/L (ref 96–112)
Creatinine, Ser: 1.09 mg/dL (ref 0.40–1.50)
GFR: 64.65 mL/min (ref >60.00)
Glucose, Bld: 95 mg/dL (ref 70–99)
Potassium: 5.1 meq/L (ref 3.5–5.1)
Sodium: 139 meq/L (ref 135–145)
Total Bilirubin: 0.5 mg/dL (ref 0.2–1.2)
Total Protein: 6.6 g/dL (ref 6.0–8.3)

## 2024-05-06 LAB — LIPID PANEL
Cholesterol: 115 mg/dL (ref 0–200)
HDL: 40.2 mg/dL (ref >39.00)
LDL Cholesterol: 60 mg/dL (ref 0–99)
NonHDL: 74.5
Total CHOL/HDL Ratio: 3
Triglycerides: 75 mg/dL (ref 0.0–149.0)
VLDL: 15 mg/dL (ref 0.0–40.0)

## 2024-05-06 LAB — HEMOGLOBIN A1C: Hgb A1c MFr Bld: 6.4 % (ref 4.6–6.5)

## 2024-05-06 MED ORDER — ATORVASTATIN CALCIUM 20 MG PO TABS: 20.0000 mg | ORAL_TABLET | Freq: Every day | ORAL | 3 refills | Status: AC

## 2024-05-06 NOTE — Patient Instructions (Signed)
 Health Maintenance, Male  Adopting a healthy lifestyle and getting preventive care are important in promoting health and wellness. Ask your health care provider about:  The right schedule for you to have regular tests and exams.  Things you can do on your own to prevent diseases and keep yourself healthy.  What should I know about diet, weight, and exercise?  Eat a healthy diet    Eat a diet that includes plenty of vegetables, fruits, low-fat dairy products, and lean protein.  Do not eat a lot of foods that are high in solid fats, added sugars, or sodium.  Maintain a healthy weight  Body mass index (BMI) is a measurement that can be used to identify possible weight problems. It estimates body fat based on height and weight. Your health care provider can help determine your BMI and help you achieve or maintain a healthy weight.  Get regular exercise  Get regular exercise. This is one of the most important things you can do for your health. Most adults should:  Exercise for at least 150 minutes each week. The exercise should increase your heart rate and make you sweat (moderate-intensity exercise).  Do strengthening exercises at least twice a week. This is in addition to the moderate-intensity exercise.  Spend less time sitting. Even light physical activity can be beneficial.  Watch cholesterol and blood lipids  Have your blood tested for lipids and cholesterol at 79 years of age, then have this test every 5 years.  You may need to have your cholesterol levels checked more often if:  Your lipid or cholesterol levels are high.  You are older than 79 years of age.  You are at high risk for heart disease.  What should I know about cancer screening?  Many types of cancers can be detected early and may often be prevented. Depending on your health history and family history, you may need to have cancer screening at various ages. This may include screening for:  Colorectal cancer.  Prostate cancer.  Skin cancer.  Lung  cancer.  What should I know about heart disease, diabetes, and high blood pressure?  Blood pressure and heart disease  High blood pressure causes heart disease and increases the risk of stroke. This is more likely to develop in people who have high blood pressure readings or are overweight.  Talk with your health care provider about your target blood pressure readings.  Have your blood pressure checked:  Every 3-5 years if you are 9-95 years of age.  Every year if you are 85 years old or older.  If you are between the ages of 29 and 29 and are a current or former smoker, ask your health care provider if you should have a one-time screening for abdominal aortic aneurysm (AAA).  Diabetes  Have regular diabetes screenings. This checks your fasting blood sugar level. Have the screening done:  Once every three years after age 23 if you are at a normal weight and have a low risk for diabetes.  More often and at a younger age if you are overweight or have a high risk for diabetes.  What should I know about preventing infection?  Hepatitis B  If you have a higher risk for hepatitis B, you should be screened for this virus. Talk with your health care provider to find out if you are at risk for hepatitis B infection.  Hepatitis C  Blood testing is recommended for:  Everyone born from [birth year] through 1966.  Anyone  with known risk factors for hepatitis C.  Sexually transmitted infections (STIs)  You should be screened each year for STIs, including gonorrhea and chlamydia, if:  You are sexually active and are younger than 79 years of age.  You are older than 78 years of age and your health care provider tells you that you are at risk for this type of infection.  Your sexual activity has changed since you were last screened, and you are at increased risk for chlamydia or gonorrhea. Ask your health care provider if you are at risk.  Ask your health care provider about whether you are at high risk for HIV. Your health care provider  may recommend a prescription medicine to help prevent HIV infection. If you choose to take medicine to prevent HIV, you should first get tested for HIV. You should then be tested every 3 months for as long as you are taking the medicine.  Follow these instructions at home:  Alcohol use  Do not drink alcohol if your health care provider tells you not to drink.  If you drink alcohol:  Limit how much you have to 0-2 drinks a day.  Know how much alcohol is in your drink. In the [place] one drink equals one 12 oz bottle of beer (355 mL), one 5 oz glass of wine (148 mL), or one 1 oz glass of hard liquor (44 mL).  Lifestyle  Do not use any products that contain nicotine or tobacco. These products include cigarettes, chewing tobacco, and vaping devices, such as e-cigarettes. If you need help quitting, ask your health care provider.  Do not use street drugs.  Do not share needles.  Ask your health care provider for help if you need support or information about quitting drugs.  General instructions  Schedule regular health, dental, and eye exams.  Stay current with your vaccines.  Tell your health care provider if:  You often feel depressed.  You have ever been abused or do not feel safe at home.  Summary  Adopting a healthy lifestyle and getting preventive care are important in promoting health and wellness.  Follow your health care provider's instructions about healthy diet, exercising, and getting tested or screened for diseases.  Follow your health care provider's instructions on monitoring your cholesterol and blood pressure.  This information is not intended to replace advice given to you by your health care provider. Make sure you discuss any questions you have with your health care provider.  Document Revised: 02/12/2021 Document Reviewed: 02/12/2021  Elsevier Patient Education  2024 [employer].

## 2024-05-06 NOTE — Progress Notes (Signed)
 Office Note 05/06/2024  CC:  Chief Complaint  Patient presents with   Annual Exam    Pt is fasting    HPI:  Patient is a 78 y.o. male who is here for annual health maintenance exam and follow-up hypercholesterolemia.  He is feeling well. No acute concerns.  Past Medical History:  Diagnosis Date   Anterior dislocation of right shoulder 09/2013   Reduced in ED under sedation.  Dislocated posteriorly 2015, ? partial RC tear, has f/u planned with Dr. Addie--? surgery? possible plan for 2016.   Blood donor, whole blood    Has donated approx 30 gallons in his lifetime   Chronic left hip pain    Severe DJD.  THA 2008.  Left acetabular revision in 2017 AND 2019.   Chronic pain syndrome    Candidate for spinal cord stimulator (03/2017)   Chronic renal insufficiency, stage II (mild)    Chronic venous insufficiency    +compression hose   DDD (degenerative disc disease), lumbar 2017   Dr. Alvah ortho.  He is now wearing a new leg brace.  Has had ESI and will get another.   Diverticulosis    +redundant colon   DVT (deep venous thrombosis) (HCC) 06/14/2016   provoked, bilateral LL's; in post-op setting s/p hip surgery.  Xarelto  x 28mo at full dosing, then 6 more months at 1/2 dosing, then stopped xarelto .     Foot drop, left    s/p hip fracture 1969   History of blood transfusion    Hx of adenomatous colonic polyps    polypectomy 2007; 2012; 2015; 05/2019; recall 30yrs   Hypercholesterolemia 02/2017   Recommended statin 02/10/17 but pt declined, wanted to do TLC trial first. Rec'd statin 04/2020   IFG (impaired fasting glucose)    04/2023 fasting 111, a1c 5.7%   Neuropathy    Osteoarthritis    knees and hips primarily   Phlebitis    Post laminectomy syndrome    improved with spinal cord stim-->stim to be removed summer/fall 2019.   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 PSA 0.16 Feb 2020 urol f/u. [id number] Aug 2021 urol. <0.015 02/2021.   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; 02/2020   MSSA; admitted for I&D 02/11/20->plan for longterm IV abx after   Urge incontinence    vesicare helpful    Past Surgical History:  Procedure Laterality Date   ANTERIOR HIP REVISION Left 06/07/2016   Procedure: LEFT ANTERIOR HIP ACETABULAR REVISION;  Surgeon: [name], MD;  Location: [facility];  Service: Orthopedics;  Laterality: Left;   ANTERIOR HIP REVISION Left 10/09/2017   Procedure: Left hip acetabular revision;  Surgeon: Poli Lonni CINDERELLA, MD;  Location: [facility];  Service: Orthopedics;  Laterality: Left;   bilateral carpal tunnel release  2003   COLONOSCOPY  2007;2012;2015;05/2019   04/12/2014 Tubular adenoma x 1; 05/2019 adenomatous polyp; recall 5 yrs.   FRACTURE SURGERY Left 1969   hip, traction hospitalized for 6 weeks   INCISION AND DRAINAGE Left 08/15/2023   Procedure: INCISION AND DRAINAGE OF SEROMA LEFT THIGH;  Surgeon: Poli Lonni CINDERELLA, MD;  Location: [facility];  Service: Orthopedics;  Laterality: Left;   INCISION AND DRAINAGE Left 09/19/2023   Procedure: INCISION AND DRAINAGE LEFT THIGH WITH WOUND VAC PLACEMENT;  Surgeon: Poli Lonni CINDERELLA, MD;  Location: [facility];  Service: Orthopedics;  Laterality: Left;   INCISION AND  DRAINAGE HIP Left 02/11/2020   Procedure: IRRIGATION AND DEBRIDEMENT LEFT HIP WITH ANTIBIOTIC SPACER PLACEMENT;  Surgeon: Vernetta Lonni GRADE, MD;  Location: [facility];  Service: Orthopedics;  Laterality: Left;   INCISION AND DRAINAGE HIP Left 01/03/2023   Procedure: IRRIGATION AND DEBRIDEMENT LEFT HIP;  Surgeon: Vernetta Lonni GRADE, MD;  Location: [facility];  Service: Orthopedics;  Laterality: Left;   INCISION AND DRAINAGE HIP Left 12/12/2023   Procedure: IRRIGATION AND DEBRIDEMENT HIP;  Surgeon: Vernetta Lonni GRADE, MD;  Location: [facility];  Service: Orthopedics;  Laterality: Left;   Irrigation and Debridement left hip   LUMBAR LAMINECTOMY/DECOMPRESSION MICRODISCECTOMY Left 03/28/2016   Patient denies: Procedure: Left Lumbar three-four Laminoforaminotomy;  Surgeon: Fairy Levels, MD;  Location: [facility];  Service: Neurosurgery;  Laterality: Left;  left   POLYPECTOMY     RADIOACTIVE SEED IMPLANT  2012   ROTATOR CUFF REPAIR  2006   right   SHOULDER ARTHROSCOPY Left    SPINAL CORD STIMULATOR IMPLANT  06/2017   removed a year after was putted in.   SPINAL CORD STIMULATOR REMOVAL     TOTAL HIP ARTHROPLASTY  2008   left (cobalt chrome)   VASECTOMY      Family History  Problem Relation Age of Onset   Lung cancer Mother    Heart disease Father    Colon cancer Neg Hx    Esophageal cancer Neg Hx    Rectal cancer Neg Hx    Stomach cancer Neg Hx    Colon polyps Neg Hx     Social History   Socioeconomic History   Marital status: Married    Spouse name: Not on file   Number of children: Not on file   Years of education: Not on file   Highest education level: Not on file  Occupational History   Not on file  Tobacco Use   Smoking status: Never   Smokeless tobacco: Never  Vaping Use   Vaping status: Never Used  Substance and Sexual Activity   Alcohol use: No   Drug use: No   Sexual activity: Not Currently  Other Topics Concern   Not on file  Social History Narrative   Married, 2 biologic children, 2 adopted, 5 GC.   Lives in [place].  Worked 35 yrs at [employer].   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   No formal exercise.  No T/A/Ds.   Enjoys woodworking.   Social Drivers of Corporate investment banker Strain: Low Risk  (03/31/2024)   Overall Financial Resource Strain (CARDIA)    Difficulty of Paying Living Expenses: Not hard at all  Food Insecurity: No Food Insecurity (03/31/2024)   Hunger Vital Sign    Worried About Running Out of Food in the Last Year: Never true    Ran Out of Food in the Last Year: Never true  Transportation  Needs: No Transportation Needs (03/31/2024)   PRAPARE - Administrator, Civil Service (Medical): No    Lack of Transportation (Non-Medical): No  Physical Activity: Insufficiently Active (03/31/2024)   Exercise Vital Sign    Days of Exercise per Week: 4 days    Minutes of Exercise per Session: 30 min  Stress: No Stress Concern Present (03/31/2024)   [employer] of Occupational Health - Occupational Stress Questionnaire    Feeling of Stress: Not at all  Social Connections: Socially Integrated (03/31/2024)   Social Connection and Isolation Panel    Frequency of Communication  with Friends and Family: Three times a week    Frequency of Social Gatherings with Friends and Family: More than three times a week    Attends Religious Services: More than 4 times per year    Active Member of [employer] or Organizations: Yes    Attends Engineer, structural: More than 4 times per year    Marital Status: Married  Catering manager Violence: Not At Risk (03/31/2024)   Humiliation, Afraid, Rape, and Kick questionnaire    Fear of Current or Ex-Partner: No    Emotionally Abused: No    Physically Abused: No    Sexually Abused: No    Outpatient Medications Prior to Visit  Medication Sig Dispense Refill   acetaminophen  (TYLENOL ) 500 MG tablet Take 1,000 mg by mouth 2 (two) times daily as needed for moderate pain or headache.      aspirin  EC 81 MG tablet Take 81 mg by mouth daily.     Calcium  Carb-Cholecalciferol (CALCIUM  + D3 PO) Take 600 mg by mouth daily.     cefadroxil  (DURICEF) 500 MG capsule Take 2 capsules (1,000 mg total) by mouth 2 (two) times daily. 120 capsule 1   solifenacin (VESICARE) 10 MG tablet Take 10 mg by mouth daily.     atorvastatin  (LIPITOR) 20 MG tablet TAKE 1 TABLET BY MOUTH EVERY DAY 30 tablet 0   oxyCODONE  (OXY IR/ROXICODONE ) 5 MG immediate release tablet Take 1-2 tablets (5-10 mg total) by mouth every 6 (six) hours as needed for moderate pain (pain score 4-6). 30  tablet 0   No facility-administered medications prior to visit.    Allergies  Allergen Reactions   Adhesive [Tape] Rash    Paper tape is ok    Review of Systems  Constitutional:  Negative for appetite change, chills, fatigue and fever.  HENT:  Negative for congestion, dental problem, ear pain and sore throat.   Eyes:  Negative for discharge, redness and visual disturbance.  Respiratory:  Negative for cough, chest tightness, shortness of breath and wheezing.   Cardiovascular:  Negative for chest pain, palpitations and leg swelling.  Gastrointestinal:  Negative for abdominal pain, blood in stool, diarrhea, nausea and vomiting.  Genitourinary:  Negative for difficulty urinating, dysuria, flank pain, frequency, hematuria and urgency.  Musculoskeletal:  Negative for arthralgias, back pain, joint swelling, myalgias and neck stiffness.  Skin:  Negative for pallor and rash.  Neurological:  Negative for dizziness, speech difficulty, weakness and headaches.  Hematological:  Negative for adenopathy. Does not bruise/bleed easily.  Psychiatric/Behavioral:  Negative for confusion and sleep disturbance. The patient is not nervous/anxious.     PE;    05/06/2024    7:57 AM 03/31/2024   11:00 AM 02/10/2024    8:48 AM  Vitals with BMI  Height 6' 0.25 6' 1 6' 1  Weight 256 lbs 6 oz 255 lbs 255 lbs  BMI 34.54 33.65 33.65  Systolic 133  154  Diastolic 75  75  Pulse 57  55    Gen: Alert, well appearing.  Patient is oriented to person, place, time, and situation. AFFECT: pleasant, lucid thought and speech. ENT: Ears: EACs clear, normal epithelium.  TMs with good light reflex and landmarks bilaterally.  Eyes: no injection, icteris, swelling, or exudate.  EOMI, PERRLA. Nose: no drainage or turbinate edema/swelling.  No injection or focal lesion.  Mouth: lips without lesion/swelling.  Oral mucosa pink and moist.  Dentition intact and without obvious caries or gingival swelling.  Oropharynx without  erythema,  exudate, or swelling.  Neck: supple/nontender.  No LAD, mass, or TM.  Carotid pulses 2+ bilaterally, without bruits. CV: RRR, no m/r/g.   LUNGS: CTA bilat, nonlabored resps, good aeration in all lung fields. ABD: soft, NT, ND, BS normal.  No hepatospenomegaly or mass.  No bruits. EXT: no clubbing, cyanosis, or edema.  Musculoskeletal: no joint swelling, erythema, warmth, or tenderness.  ROM of all joints intact. Skin - no sores or suspicious lesions or rashes or color changes  Pertinent labs:  Lab Results  Component Value Date   TSH 2.07 02/10/2017   Lab Results  Component Value Date   WBC 5.7 12/14/2023   HGB 10.2 (L) 12/14/2023   HCT 34.1 (L) 12/14/2023   MCV 84.2 12/14/2023   PLT 216 12/14/2023   Lab Results  Component Value Date   CREATININE 1.04 12/13/2023   BUN 22 12/13/2023   NA 135 12/13/2023   K 5.0 12/13/2023   CL 103 12/13/2023   CO2 23 12/13/2023   Lab Results  Component Value Date   ALT 12 12/04/2023   AST 13 12/04/2023   ALKPHOS 71 08/07/2023   BILITOT 0.4 12/04/2023   Lab Results  Component Value Date   CHOL 116 05/05/2023   Lab Results  Component Value Date   HDL 35.40 (L) 05/05/2023   Lab Results  Component Value Date   LDLCALC 63 05/05/2023   Lab Results  Component Value Date   TRIG 87.0 05/05/2023   Lab Results  Component Value Date   CHOLHDL 3 05/05/2023   Lab Results  Component Value Date   PSA <0.015 03/11/2024   PSA <0.015 03/13/2023   PSA <0.015 06/14/2022   Lab Results  Component Value Date   HGBA1C 5.7 05/06/2023   ASSESSMENT AND PLAN:   1) Health maintenance exam: Reviewed age and gender appropriate health maintenance issues (prudent diet, regular exercise, health risks of tobacco and excessive alcohol, use of seatbelts, fire alarms in home, use of sunscreen).  Also reviewed age and gender appropriate health screening as well as vaccine recommendations. Vaccines:  ALL UTD. Labs:  cbc,cmet, lipids, hemoglobin  A1c (IFG) Prostate ca screening: hx of prostate ca and is followed by urol. Colon ca screening: next colonoscopy due 05/2024--> he chooses to decline any further colonoscopies due to increased risk of perforation with age.  #2 hypercholesterolemia, doing well on atorvastatin  20 mg a day. Lipid panel and hepatic panel today.  An After Visit Summary was printed and given to the patient.  FOLLOW UP:  Return in about 1 year (around 05/06/2025) for annual CPE (fasting).  Signed:  Gerlene Hockey, MD           05/06/2024

## 2024-05-07 ENCOUNTER — Encounter: Payer: Self-pay | Admitting: Family Medicine

## 2024-05-07 ENCOUNTER — Ambulatory Visit: Payer: Self-pay | Admitting: Family Medicine

## 2024-05-07 DIAGNOSIS — L03116 Cellulitis of left lower limb: Secondary | ICD-10-CM | POA: Diagnosis not present

## 2024-05-07 DIAGNOSIS — T8189XA Other complications of procedures, not elsewhere classified, initial encounter: Secondary | ICD-10-CM | POA: Diagnosis not present

## 2024-05-12 ENCOUNTER — Other Ambulatory Visit: Payer: Self-pay

## 2024-05-12 ENCOUNTER — Telehealth: Payer: Self-pay | Admitting: Internal Medicine

## 2024-05-12 MED ORDER — CEFADROXIL 500 MG PO CAPS: 1000.0000 mg | ORAL_CAPSULE | Freq: Two times a day (BID) | ORAL | 1 refills | Status: DC

## 2024-05-12 NOTE — Telephone Encounter (Signed)
 I called [name] to reschedule an appt due to the provider being out. Pt requested a refill of cefadroxil  to the [facility] on [address]. Pt is now scheduled 9/17; declined seeing a different provider.

## 2024-05-12 NOTE — Telephone Encounter (Signed)
 Refill sent.

## 2024-05-13 ENCOUNTER — Ambulatory Visit: Admitting: Internal Medicine

## 2024-05-21 DIAGNOSIS — L03116 Cellulitis of left lower limb: Secondary | ICD-10-CM | POA: Diagnosis not present

## 2024-05-21 DIAGNOSIS — T8189XA Other complications of procedures, not elsewhere classified, initial encounter: Secondary | ICD-10-CM | POA: Diagnosis not present

## 2024-05-26 ENCOUNTER — Ambulatory Visit (INDEPENDENT_AMBULATORY_CARE_PROVIDER_SITE_OTHER): Admitting: Orthopaedic Surgery

## 2024-05-26 ENCOUNTER — Encounter: Payer: Self-pay | Admitting: Orthopaedic Surgery

## 2024-05-26 DIAGNOSIS — M009 Pyogenic arthritis, unspecified: Secondary | ICD-10-CM | POA: Diagnosis not present

## 2024-05-26 NOTE — Progress Notes (Signed)
 [name] still follows up as a relates to his chronic left hip prosthetic infection.  His last incision and drainage/irrigation debridement was 5 months ago.  He still has a chronic wound that does drain.  I have tried to ellipse this out in the office and sewed back together locally but he continued to have a small wound there.  The skin looks good and there is no redness and no purulent drainage on his left hip incision.  There is no firmness to the incision and looks good overall except for the chronic opening.  I did place silver nitrate sticks on this opening and some Xeroform and a new dressing.  At this point I would like to see if my partner [name] has anything to offer from a soft tissue chronic wound management standpoint of things.  He would like to see if [name] has anything to offer as well.  He can then get him back to me after that.  The patient understands that he does still have chronic infection as a relates to his retained hip components.

## 2024-06-10 ENCOUNTER — Telehealth: Payer: Self-pay | Admitting: Orthopaedic Surgery

## 2024-06-10 NOTE — Telephone Encounter (Signed)
 Rhea called stating 4 orders sent in May and need to be signed for [facility] billing purpose and pt can received medical supplies. Rhea sent fax orders for [facility] to sign and fax back to [phone]

## 2024-06-10 NOTE — Telephone Encounter (Signed)
 I refaxed all the orders that have been signed and faxed from May to [phone]. Advised if still missing any orders to refax to our office.

## 2024-06-14 ENCOUNTER — Encounter: Admitting: Orthopedic Surgery

## 2024-06-17 ENCOUNTER — Ambulatory Visit (INDEPENDENT_AMBULATORY_CARE_PROVIDER_SITE_OTHER): Admitting: Orthopedic Surgery

## 2024-06-17 DIAGNOSIS — M009 Pyogenic arthritis, unspecified: Secondary | ICD-10-CM

## 2024-06-17 DIAGNOSIS — Z9889 Other specified postprocedural states: Secondary | ICD-10-CM

## 2024-06-17 DIAGNOSIS — T8131XA Disruption of external operation (surgical) wound, not elsewhere classified, initial encounter: Secondary | ICD-10-CM

## 2024-06-21 ENCOUNTER — Other Ambulatory Visit: Payer: Self-pay

## 2024-06-21 ENCOUNTER — Encounter (HOSPITAL_COMMUNITY): Payer: Self-pay | Admitting: Orthopedic Surgery

## 2024-06-21 ENCOUNTER — Encounter: Payer: Self-pay | Admitting: Orthopedic Surgery

## 2024-06-21 MED ORDER — CEFADROXIL 500 MG PO CAPS: 1000.0000 mg | ORAL_CAPSULE | Freq: Two times a day (BID) | ORAL | 6 refills | Status: DC

## 2024-06-21 NOTE — Progress Notes (Signed)
 Office Visit Note   Patient: [name]           Date of Birth: 11/08/1945           MRN: [id number] Visit Date: 06/17/2024              Requested by: Candise Aleene DEL, MD [address] [place],  [place] [address] PCP: Candise Aleene DEL, MD  Chief Complaint  Patient presents with   Left Hip - Wound Check      HPI: Discussed the use of AI scribe software for clinical note transcription with the patient, who gave verbal consent to proceed.  History of Present Illness [name] is a 78 year old male who presents with a chronic draining wound over the anterior hip incision.  He has had a chronic draining wound over the anterior hip incision on the left side for six months. The wound continues to drain significantly despite previous attempts to close it with four or five sutures, which did not result in healing.  He underwent surgery in March and has been receiving wound care since then without significant improvement. He is concerned about the need for another hospital visit.  He recalls a similar experience with a wound vac pump in the past for a pocket of fluid on his hip, which required drainage and the use of a suction sponge. He describes the previous pump as larger and square, whereas the current one being considered is smaller, about the size of a grapefruit.  He mentions having family from a small farm.     Assessment & Plan: Visit Diagnoses:  1. Wound dehiscence, surgical, initial encounter   2. 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 Previous suture attempts failed. Possible suture knot or unhealthy fascia. Surgical intervention required. - Schedule surgical debridement and cleaning as outpatient next week. - Apply dressing with  suction sponge connected to wound vac post-surgery. - Coordinate with [place] for surgery date, Wednesday or Friday. - Perform procedure at [facility].      Follow-Up Instructions: No follow-ups on file.   Ortho Exam  Patient is alert, oriented, no adenopathy, well-dressed, normal affect, normal respiratory effort. Physical Exam SKIN: Chronic draining wound over left anterior hip incision. Large amount of old hematoma expressed. Fibrinous exudate tissue debrided.      Imaging: No results found.   Labs: Lab Results  Component Value Date   HGBA1C 6.4 05/06/2024   HGBA1C 5.7 05/06/2023   HGBA1C 5.4 05/27/2013   ESRSEDRATE 11 02/10/2024   ESRSEDRATE 5 01/04/2023   ESRSEDRATE 2 07/27/2020   CRP 10.8 (H) 02/10/2024   CRP 37.4 (H) 12/04/2023   CRP 22.1 (H) 10/14/2023   LABURIC 7.8 05/27/2013   REPTSTATUS 12/17/2023 FINAL 12/12/2023   GRAMSTAIN  12/12/2023    FEW WBC PRESENT, PREDOMINANTLY PMN NO ORGANISMS SEEN    CULT  12/12/2023    FEW METHICILLIN RESISTANT STAPHYLOCOCCUS AUREUS NO ANAEROBES ISOLATED Performed at [facility], [address] [place], [place] [address]    LABORGA METHICILLIN RESISTANT STAPHYLOCOCCUS AUREUS 12/12/2023     Lab Results  Component Value Date   ALBUMIN  3.9 05/06/2024   ALBUMIN  3.4 (L) 08/07/2023   ALBUMIN  3.8 05/05/2023    No results found for:  MG No results found for: VD25OH  No results found for: PREALBUMIN    Latest Ref Rng & Units 05/06/2024    8:15 AM 12/14/2023    5:09 AM 12/13/2023    5:17 AM  CBC EXTENDED  WBC 4.0 - 10.5 K/uL 5.7  5.7  5.6   RBC 4.22 - 5.81 Mil/uL 5.50  4.05  4.42   Hemoglobin 13.0 - 17.0 g/dL 86.9  89.7  88.8   HCT 39.0 - 52.0 % 41.5  34.1  36.9   Platelets 150.0 - 400.0 K/uL 251.0  216  235   NEUT# 1.4 - 7.7 K/uL 2.7     Lymph# 0.7 - 4.0 K/uL 2.1        There is no height or weight on file to calculate BMI.  Orders:  No orders of the defined types were placed in this  encounter.  No orders of the defined types were placed in this encounter.    Procedures: No procedures performed  Clinical Data: No additional findings.  ROS:  All other systems negative, except as noted in the HPI. Review of Systems  Objective: Vital Signs: There were no vitals taken for this visit.  Specialty Comments:  No specialty comments available.  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   Pain in left hip 09/01/2017   History of revision of total replacement of left hip joint 09/01/2017   Encounter for surveillance of recalled total hip arthroplasty hardware (HCC) 06/07/2016   Status post revision of total hip replacement 06/07/2016   Spinal stenosis, lumbar region, 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  Dislocated posteriorly 2015, ? partial RC tear, has f/u planned with Dr. Addie--? surgery? possible plan for 2016.   Blood donor, whole blood    Has donated approx  30 gallons in his lifetime   Chronic left hip pain    Severe DJD.  THA 2008.  Left acetabular revision in 2017 AND 2019.   Chronic pain syndrome    Candidate for spinal cord stimulator (03/2017)   Chronic renal insufficiency, stage II (mild)    Chronic venous insufficiency    +compression hose   DDD (degenerative disc disease), lumbar 2017   Dr. Alvah ortho.  He is now wearing a new leg brace.  Has had ESI and will get another.   Diverticulosis    +redundant colon   DVT (deep venous thrombosis) (HCC) 06/14/2016   provoked, bilateral LL's; in post-op setting s/p hip surgery.  Xarelto  x 84mo at full dosing, then 6 more months at 1/2 dosing,  then stopped xarelto .     Foot drop, left    s/p hip fracture 1969   History of blood transfusion    Hx of adenomatous colonic polyps    polypectomy 2007; 2012; 2015; 05/2019; recall 33yrs   Hypercholesterolemia 02/2017   Recommended statin 02/10/17 but pt declined, wanted to do TLC trial first. Rec'd statin 04/2020   Neuropathy    Osteoarthritis    knees and hips primarily   Phlebitis    Post laminectomy syndrome    improved with spinal cord stim-->stim to be removed summer/fall 2019.   Prediabetes    04/2023 fasting 111, a1c 5.7%.  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 PSA 0.16 Feb 2020 urol f/u. [id number] Aug 2021 urol. <0.015 02/2021.   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; 02/2020   MSSA; admitted for I&D 02/11/20->plan for longterm IV abx after   Urge incontinence    vesicare helpful    Family History  Problem Relation Age of Onset   Lung cancer Mother    Heart disease Father    Colon cancer Neg Hx    Esophageal cancer Neg Hx    Rectal cancer Neg Hx    Stomach cancer Neg Hx    Colon polyps Neg Hx     Past Surgical History:  Procedure Laterality Date   ANTERIOR HIP REVISION  Left 06/07/2016   Procedure: LEFT ANTERIOR HIP ACETABULAR REVISION;  Surgeon: [name], MD;  Location: [facility];  Service: Orthopedics;  Laterality: Left;   ANTERIOR HIP REVISION Left 10/09/2017   Procedure: Left hip acetabular revision;  Surgeon: Poli Lonni CINDERELLA, MD;  Location: [facility];  Service: Orthopedics;  Laterality: Left;   bilateral carpal tunnel release  2003   COLONOSCOPY  2007;2012;2015;05/2019   04/12/2014 Tubular adenoma x 1; 05/2019 adenomatous polyp; recall 5 yrs.   FRACTURE SURGERY Left 1969   hip, traction hospitalized for 6 weeks   INCISION AND DRAINAGE Left 08/15/2023   Procedure: INCISION AND DRAINAGE OF SEROMA LEFT THIGH;  Surgeon: Poli Lonni CINDERELLA, MD;  Location: [facility];  Service: Orthopedics;  Laterality: Left;   INCISION AND DRAINAGE Left 09/19/2023   Procedure: INCISION AND DRAINAGE LEFT THIGH WITH WOUND VAC PLACEMENT;  Surgeon: Poli Lonni CINDERELLA, MD;  Location: [facility];  Service: Orthopedics;  Laterality: Left;   INCISION AND DRAINAGE HIP Left 02/11/2020   Procedure: IRRIGATION AND DEBRIDEMENT LEFT HIP WITH ANTIBIOTIC SPACER PLACEMENT;  Surgeon: Poli Lonni CINDERELLA, MD;  Location: [facility];  Service: Orthopedics;  Laterality: Left;   INCISION AND DRAINAGE HIP Left 01/03/2023   Procedure: IRRIGATION AND DEBRIDEMENT LEFT HIP;  Surgeon: Poli Lonni CINDERELLA, MD;  Location: [facility];  Service: Orthopedics;  Laterality: Left;   INCISION AND DRAINAGE HIP Left 12/12/2023   Procedure: IRRIGATION AND DEBRIDEMENT HIP;  Surgeon: Poli Lonni CINDERELLA, MD;  Location: [facility];  Service: Orthopedics;  Laterality: Left;  Irrigation and Debridement left hip   LUMBAR LAMINECTOMY/DECOMPRESSION MICRODISCECTOMY Left 03/28/2016   Patient denies: Procedure: Left Lumbar three-four Laminoforaminotomy;  Surgeon: Fairy Levels, MD;  Location: [facility];  Service: Neurosurgery;  Laterality: Left;  left   POLYPECTOMY     RADIOACTIVE SEED IMPLANT  2012   ROTATOR  CUFF REPAIR  2006   right   SHOULDER ARTHROSCOPY Left    SPINAL CORD STIMULATOR IMPLANT  06/2017   removed  a year after was putted in.   SPINAL CORD STIMULATOR REMOVAL     TOTAL HIP ARTHROPLASTY  2008   left (cobalt chrome)   VASECTOMY     Social History   Occupational History   Not on file  Tobacco Use   Smoking status: Never   Smokeless tobacco: Never  Vaping Use   Vaping status: Never Used  Substance and Sexual Activity   Alcohol use: No   Drug use: No   Sexual activity: Not Currently

## 2024-06-21 NOTE — Progress Notes (Signed)
 SDW CALL  Patient was given pre-op instructions over the phone. The opportunity was given for the patient to ask questions. No further questions asked. Patient verbalized understanding of instructions given.   PCP - Aleene Hockey Cardiologist - denies  PPM/ICD - denies   Chest x-ray - denies EKG - DOS Stress Test - denies  ECHO - denies Cardiac Cath - denies  Sleep Study - denies  No DM  Last dose of GLP1 agonist-  n/a GLP1 instructions:  n/a  Blood Thinner Instructions: n/a Aspirin  Instructions: last dose was 9/11  ERAS Protcol - clears until 0715 PRE-SURGERY Ensure or G2- n/a  COVID TEST- n/a   Anesthesia review: no  Patient denies shortness of breath, fever, cough and chest pain over the phone call   All instructions explained to the patient, with a verbal understanding of the material. Patient agrees to go over the instructions while at home for a better understanding.

## 2024-06-22 NOTE — H&P (Signed)
 [name] is an 78 y.o. male.   Chief Complaint: Left hip infection HPI:  He has had a chronic draining wound over the anterior hip incision on the left side for six months. The wound continues to drain significantly despite previous attempts to close it with four or five sutures, which did not result in healing.   He underwent surgery in March and has been receiving wound care since then without significant improvement. He is concerned about the need for another hospital visit.   He recalls a similar experience with a wound vac pump in the past for a pocket of fluid on his hip, which required drainage and the use of a suction sponge. He describes the previous pump as larger and square, whereas the current one being considered is smaller, about the size of a grapefruit.   He mentions having family from a small farm.  Past Medical History:  Diagnosis Date   Anterior dislocation of right shoulder 09/2013   Reduced in ED under sedation.  Dislocated posteriorly 2015, ? partial RC tear, has f/u planned with Dr. Addie--? surgery? possible plan for 2016.   Blood donor, whole blood    Has donated approx 30 gallons in his lifetime   Chronic left hip pain    Severe DJD.  THA 2008.  Left acetabular revision in 2017 AND 2019.   Chronic pain syndrome    Candidate for spinal cord stimulator (03/2017)   Chronic renal insufficiency, stage II (mild)    Chronic venous insufficiency    +compression hose   DDD (degenerative disc disease), lumbar 2017   Dr. Alvah ortho.  He is now wearing a new leg brace.  Has had ESI and will get another.   Diverticulosis    +redundant colon   DVT (deep venous thrombosis) (HCC) 06/14/2016   provoked, bilateral LL's; in post-op setting s/p hip surgery.  Xarelto  x 22mo at full dosing, then 6 more months at 1/2 dosing, then stopped xarelto .     Foot drop, left    s/p hip fracture 1969   History of blood transfusion    Hx of adenomatous colonic polyps     polypectomy 2007; 2012; 2015; 05/2019; recall 48yrs   Hypercholesterolemia 02/2017   Recommended statin 02/10/17 but pt declined, wanted to do TLC trial first. Rec'd statin 04/2020   Neuropathy    Osteoarthritis    knees and hips primarily   Phlebitis    Post laminectomy syndrome    improved with spinal cord stim-->stim to be removed summer/fall 2019.   Prediabetes    04/2023 fasting 111, a1c 5.7%.  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 PSA 0.16 Feb 2020 urol f/u. [id number] Aug 2021 urol. <0.015 02/2021.   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; 02/2020   MSSA; admitted for I&D 02/11/20->plan for longterm IV abx after   Urge incontinence    vesicare helpful    Past Surgical History:  Procedure Laterality Date   ANTERIOR HIP REVISION Left 06/07/2016   Procedure: LEFT ANTERIOR HIP ACETABULAR REVISION;  Surgeon: [name], MD;  [facility]: [facility];  Service: Orthopedics;  Laterality: Left;   ANTERIOR HIP REVISION Left 10/09/2017   Procedure: Left hip acetabular revision;  Surgeon: Poli Lonni CINDERELLA, MD;  Location: [facility];  Service: Orthopedics;  Laterality: Left;   bilateral carpal tunnel release  2003  COLONOSCOPY  2007;2012;2015;05/2019   04/12/2014 Tubular adenoma x 1; 05/2019 adenomatous polyp; recall 5 yrs.   FRACTURE SURGERY Left 1969   hip, traction hospitalized for 6 weeks   INCISION AND DRAINAGE Left 08/15/2023   Procedure: INCISION AND DRAINAGE OF SEROMA LEFT THIGH;  Surgeon: Vernetta Lonni GRADE, MD;  [facility];  Service: Orthopedics;  Laterality: Left;   INCISION AND DRAINAGE Left 09/19/2023   Procedure: INCISION AND DRAINAGE LEFT THIGH WITH WOUND VAC PLACEMENT;  Surgeon: Vernetta Lonni GRADE, MD;  Location: [facility];  Service: Orthopedics;  Laterality: Left;   INCISION AND DRAINAGE HIP Left 02/11/2020    Procedure: IRRIGATION AND DEBRIDEMENT LEFT HIP WITH ANTIBIOTIC SPACER PLACEMENT;  Surgeon: Vernetta Lonni GRADE, MD;  [facility]: [facility];  Service: Orthopedics;  Laterality: Left;   INCISION AND DRAINAGE HIP Left 01/03/2023   Procedure: IRRIGATION AND DEBRIDEMENT LEFT HIP;  Surgeon: Vernetta Lonni GRADE, MD;  Location[facility];  Service: Orthopedics;  Laterality: Left;   INCISION AND DRAINAGE HIP Left 12/12/2023   Procedure: IRRIGATION AND DEBRIDEMENT HIP;  Surgeon: Vernetta Lonni GRADE, MD;  Location: [facility];  Service: Orthopedics;  Laterality: Left;  Irrigation and Debridement left hip   LUMBAR LAMINECTOMY/DECOMPRESSION MICRODISCECTOMY Left 03/28/2016   Patient denies: Procedure: Left Lumbar three-four Laminoforaminotomy;  Surgeon: Fairy Levels, MD;  Location: [facility];  Service: Neurosurgery;  Laterality: Left;  left   POLYPECTOMY     RADIOACTIVE SEED IMPLANT  2012   ROTATOR CUFF REPAIR  2006   right   SHOULDER ARTHROSCOPY Left    SPINAL CORD STIMULATOR IMPLANT  06/2017   removed a year after was putted in.   SPINAL CORD STIMULATOR REMOVAL     TOTAL HIP ARTHROPLASTY  2008   left (cobalt chrome)   VASECTOMY      Family History  Problem Relation Age of Onset   Lung cancer Mother    Heart disease Father    Colon cancer Neg Hx    Esophageal cancer Neg Hx    Rectal cancer Neg Hx    Stomach cancer Neg Hx    Colon polyps Neg Hx    Social History:  reports that he has never smoked. He has never used smokeless tobacco. He reports that he does not drink alcohol and does not use drugs.  Allergies:  Allergies  Allergen Reactions   Adhesive [Tape] Rash    Paper tape is ok    No medications prior to admission.    No results found for this or any previous visit (from the past 48 hours). No results found.  Review of Systems  All other systems reviewed and are negative.   There were no vitals taken for this visit. Physical Exam  Lungs non labored breathing  Heart  RRR Neurological:     Mental Status: He is alert and oriented to person, place, and time.  Psychiatric:        Behavior: Behavior normal.  SKIN: Chronic draining wound over left anterior hip incision. Large amount of old hematoma expressed. Fibrinous exudate tissue debrided.    Assessment/Plan Visit Diagnoses:  1. Wound dehiscence, surgical, initial encounter   2. 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 Previous suture attempts failed. Possible suture knot or unhealthy fascia. Surgical intervention required. -  Schedule surgical debridement and cleaning as outpatient next week. - Apply dressing with suction sponge connected to wound vac post-surgery.  Maurilio Deland Collet, PA-C 06/22/2024, 8:41 AM

## 2024-06-23 ENCOUNTER — Ambulatory Visit: Admitting: Internal Medicine

## 2024-06-23 ENCOUNTER — Other Ambulatory Visit: Payer: Self-pay

## 2024-06-23 ENCOUNTER — Encounter (HOSPITAL_COMMUNITY)
Admission: AD | Disposition: A | Discharge status: Home / Self Care | Payer: Self-pay | Source: Home / Self Care | Attending: Orthopedic Surgery

## 2024-06-23 ENCOUNTER — Encounter (HOSPITAL_COMMUNITY): Payer: Self-pay | Admitting: Orthopedic Surgery

## 2024-06-23 ENCOUNTER — Ambulatory Visit (HOSPITAL_COMMUNITY): Admitting: Registered Nurse

## 2024-06-23 ENCOUNTER — Inpatient Hospital Stay (HOSPITAL_COMMUNITY)
Admission: AD | Admit: 2024-06-23 | Discharge: 2024-06-28 | DRG: 464 | Disposition: A | Discharge status: Home / Self Care | Attending: Orthopedic Surgery | Admitting: Orthopedic Surgery

## 2024-06-23 DIAGNOSIS — T8149XA Infection following a procedure, other surgical site, initial encounter: Secondary | ICD-10-CM | POA: Diagnosis not present

## 2024-06-23 DIAGNOSIS — Z923 Personal history of irradiation: Secondary | ICD-10-CM

## 2024-06-23 DIAGNOSIS — E7800 Pure hypercholesterolemia, unspecified: Secondary | ICD-10-CM | POA: Diagnosis present

## 2024-06-23 DIAGNOSIS — Z860101 Personal history of adenomatous and serrated colon polyps: Secondary | ICD-10-CM

## 2024-06-23 DIAGNOSIS — Z91048 Other nonmedicinal substance allergy status: Secondary | ICD-10-CM

## 2024-06-23 DIAGNOSIS — T8459XA Infection and inflammatory reaction due to other internal joint prosthesis, initial encounter: Secondary | ICD-10-CM

## 2024-06-23 DIAGNOSIS — N182 Chronic kidney disease, stage 2 (mild): Secondary | ICD-10-CM | POA: Diagnosis not present

## 2024-06-23 DIAGNOSIS — Z9682 Presence of neurostimulator: Secondary | ICD-10-CM | POA: Diagnosis not present

## 2024-06-23 DIAGNOSIS — G894 Chronic pain syndrome: Secondary | ICD-10-CM | POA: Diagnosis not present

## 2024-06-23 DIAGNOSIS — M009 Pyogenic arthritis, unspecified: Secondary | ICD-10-CM | POA: Diagnosis not present

## 2024-06-23 DIAGNOSIS — B9561 Methicillin susceptible Staphylococcus aureus infection as the cause of diseases classified elsewhere: Secondary | ICD-10-CM | POA: Diagnosis not present

## 2024-06-23 DIAGNOSIS — Z7982 Long term (current) use of aspirin: Secondary | ICD-10-CM

## 2024-06-23 DIAGNOSIS — Z8546 Personal history of malignant neoplasm of prostate: Secondary | ICD-10-CM | POA: Diagnosis not present

## 2024-06-23 DIAGNOSIS — Z86718 Personal history of other venous thrombosis and embolism: Secondary | ICD-10-CM

## 2024-06-23 DIAGNOSIS — Z96649 Presence of unspecified artificial hip joint: Secondary | ICD-10-CM | POA: Diagnosis not present

## 2024-06-23 DIAGNOSIS — Z79899 Other long term (current) drug therapy: Secondary | ICD-10-CM

## 2024-06-23 DIAGNOSIS — T8459XD Infection and inflammatory reaction due to other internal joint prosthesis, subsequent encounter: Secondary | ICD-10-CM | POA: Diagnosis not present

## 2024-06-23 DIAGNOSIS — Z8249 Family history of ischemic heart disease and other diseases of the circulatory system: Secondary | ICD-10-CM

## 2024-06-23 DIAGNOSIS — Y831 Surgical operation with implant of artificial internal device as the cause of abnormal reaction of the patient, or of later complication, without mention of misadventure at the time of the procedure: Secondary | ICD-10-CM | POA: Diagnosis present

## 2024-06-23 DIAGNOSIS — T8131XA Disruption of external operation (surgical) wound, not elsewhere classified, initial encounter: Secondary | ICD-10-CM | POA: Diagnosis present

## 2024-06-23 DIAGNOSIS — T8459XS Infection and inflammatory reaction due to other internal joint prosthesis, sequela: Secondary | ICD-10-CM | POA: Diagnosis not present

## 2024-06-23 DIAGNOSIS — Z8672 Personal history of thrombophlebitis: Secondary | ICD-10-CM | POA: Diagnosis not present

## 2024-06-23 DIAGNOSIS — G629 Polyneuropathy, unspecified: Secondary | ICD-10-CM | POA: Diagnosis present

## 2024-06-23 DIAGNOSIS — Z801 Family history of malignant neoplasm of trachea, bronchus and lung: Secondary | ICD-10-CM

## 2024-06-23 DIAGNOSIS — M71052 Abscess of bursa, left hip: Principal | ICD-10-CM

## 2024-06-23 DIAGNOSIS — T8452XA Infection and inflammatory reaction due to internal left hip prosthesis, initial encounter: Secondary | ICD-10-CM | POA: Diagnosis not present

## 2024-06-23 DIAGNOSIS — Z792 Long term (current) use of antibiotics: Secondary | ICD-10-CM

## 2024-06-23 DIAGNOSIS — B957 Other staphylococcus as the cause of diseases classified elsewhere: Secondary | ICD-10-CM | POA: Diagnosis not present

## 2024-06-23 HISTORY — PX: INCISION AND DRAINAGE HIP: SHX1801

## 2024-06-23 LAB — COMPREHENSIVE METABOLIC PANEL WITH GFR
ALT: 14 U/L (ref 0–44)
AST: 22 U/L (ref 15–41)
Albumin: 3.3 g/dL — ABNORMAL LOW (ref 3.5–5.0)
Alkaline Phosphatase: 78 U/L (ref 38–126)
Anion gap: 10 (ref 5–15)
BUN: 22 mg/dL (ref 8–23)
CO2: 19 mmol/L — ABNORMAL LOW (ref 22–32)
Calcium: 9.5 mg/dL (ref 8.9–10.3)
Chloride: 108 mmol/L (ref 98–111)
Creatinine, Ser: 1.42 mg/dL — ABNORMAL HIGH (ref 0.61–1.24)
GFR, Estimated: 50 mL/min — ABNORMAL LOW (ref >60)
Glucose, Bld: 96 mg/dL (ref 70–99)
Potassium: 4.9 mmol/L (ref 3.5–5.1)
Sodium: 137 mmol/L (ref 135–145)
Total Bilirubin: 0.8 mg/dL (ref 0.0–1.2)
Total Protein: 6.5 g/dL (ref 6.5–8.1)

## 2024-06-23 LAB — CBC WITH DIFFERENTIAL/PLATELET
Abs Immature Granulocytes: 0 K/uL (ref 0.00–0.07)
Basophils Absolute: 0 K/uL (ref 0.0–0.1)
Basophils Relative: 1 %
Eosinophils Absolute: 0.2 K/uL (ref 0.0–0.5)
Eosinophils Relative: 3 %
HCT: 47.2 % (ref 39.0–52.0)
Hemoglobin: 14.3 g/dL (ref 13.0–17.0)
Immature Granulocytes: 0 %
Lymphocytes Relative: 40 %
Lymphs Abs: 2.1 K/uL (ref 0.7–4.0)
MCH: 25.4 pg — ABNORMAL LOW (ref 26.0–34.0)
MCHC: 30.3 g/dL (ref 30.0–36.0)
MCV: 84 fL (ref 80.0–100.0)
Monocytes Absolute: 0.6 K/uL (ref 0.1–1.0)
Monocytes Relative: 11 %
Neutro Abs: 2.3 K/uL (ref 1.7–7.7)
Neutrophils Relative %: 45 %
Platelets: 222 K/uL (ref 150–400)
RBC: 5.62 MIL/uL (ref 4.22–5.81)
RDW: 19.4 % — ABNORMAL HIGH (ref 11.5–15.5)
WBC: 5.2 K/uL (ref 4.0–10.5)
nRBC: 0 % (ref 0.0–0.2)

## 2024-06-23 SURGERY — IRRIGATION AND DEBRIDEMENT HIP
Anesthesia: General | Site: Hip | Laterality: Left

## 2024-06-23 MED ORDER — CEFADROXIL 500 MG PO CAPS
1000.0000 mg | ORAL_CAPSULE | Freq: Two times a day (BID) | ORAL | Status: DC
Start: 2024-06-23 — End: 2024-06-23

## 2024-06-23 MED ORDER — VANCOMYCIN HCL 1000 MG IV SOLR
INTRAVENOUS | Status: DC | PRN
  Administered 2024-06-23: 1000 mg via TOPICAL

## 2024-06-23 MED ORDER — ATORVASTATIN CALCIUM 10 MG PO TABS
20.0000 mg | ORAL_TABLET | Freq: Every day | ORAL | Status: DC
Start: 2024-06-23 — End: 2024-06-28
  Administered 2024-06-23 – 2024-06-28 (×6): 20 mg via ORAL
  Filled 2024-06-23 (×6): qty 2

## 2024-06-23 MED ORDER — FESOTERODINE FUMARATE ER 8 MG PO TB24
8.0000 mg | ORAL_TABLET | Freq: Every day | ORAL | Status: DC
Start: 2024-06-23 — End: 2024-06-28
  Administered 2024-06-23 – 2024-06-28 (×5): 8 mg via ORAL
  Filled 2024-06-23 (×6): qty 1

## 2024-06-23 MED ORDER — DOCUSATE SODIUM 100 MG PO CAPS
100.0000 mg | ORAL_CAPSULE | Freq: Two times a day (BID) | ORAL | Status: DC
  Administered 2024-06-23 – 2024-06-27 (×8): 100 mg via ORAL
  Filled 2024-06-23 (×11): qty 1

## 2024-06-23 MED ORDER — OXYCODONE HCL 5 MG PO TABS
5.0000 mg | ORAL_TABLET | Freq: Once | ORAL | Status: AC | PRN
  Administered 2024-06-23: 5 mg via ORAL

## 2024-06-23 MED ORDER — ONDANSETRON HCL 4 MG/2ML IJ SOLN
INTRAMUSCULAR | Status: DC | PRN
  Administered 2024-06-23: 4 mg via INTRAVENOUS

## 2024-06-23 MED ORDER — EPHEDRINE SULFATE-NACL 50-0.9 MG/10ML-% IV SOSY
PREFILLED_SYRINGE | INTRAVENOUS | Status: DC | PRN
Start: 2024-06-23 — End: 2024-06-23
  Administered 2024-06-23: 10 mg via INTRAVENOUS
  Administered 2024-06-23 (×4): 5 mg via INTRAVENOUS

## 2024-06-23 MED ORDER — FENTANYL CITRATE (PF) 250 MCG/5ML IJ SOLN
INTRAMUSCULAR | Status: AC
  Filled 2024-06-23: qty 5

## 2024-06-23 MED ORDER — ONDANSETRON HCL 4 MG/2ML IJ SOLN: 4.0000 mg | Freq: Four times a day (QID) | INTRAMUSCULAR | Status: DC | PRN

## 2024-06-23 MED ORDER — ACETAMINOPHEN 10 MG/ML IV SOLN
INTRAVENOUS | Status: AC
Start: 2024-06-23 — End: 2024-06-23
  Filled 2024-06-23: qty 100

## 2024-06-23 MED ORDER — DEXAMETHASONE SODIUM PHOSPHATE 10 MG/ML IJ SOLN
INTRAMUSCULAR | Status: DC | PRN
Start: 2024-06-23 — End: 2024-06-23
  Administered 2024-06-23: 5 mg via INTRAVENOUS

## 2024-06-23 MED ORDER — CEFAZOLIN SODIUM-DEXTROSE 2-4 GM/100ML-% IV SOLN
2.0000 g | INTRAVENOUS | Status: AC
  Administered 2024-06-23: 2 g via INTRAVENOUS
  Filled 2024-06-23: qty 100

## 2024-06-23 MED ORDER — ACETAMINOPHEN 10 MG/ML IV SOLN: 1000.0000 mg | Freq: Once | INTRAVENOUS | Status: DC | PRN

## 2024-06-23 MED ORDER — ACETAMINOPHEN 500 MG PO TABS
1000.0000 mg | ORAL_TABLET | Freq: Four times a day (QID) | ORAL | Status: AC
  Administered 2024-06-23 – 2024-06-24 (×3): 1000 mg via ORAL
  Filled 2024-06-23 (×4): qty 2

## 2024-06-23 MED ORDER — CHLORHEXIDINE GLUCONATE 0.12 % MT SOLN
15.0000 mL | Freq: Once | OROMUCOSAL | Status: AC
  Administered 2024-06-23: 15 mL via OROMUCOSAL
  Filled 2024-06-23: qty 15

## 2024-06-23 MED ORDER — ENOXAPARIN SODIUM 40 MG/0.4ML IJ SOSY
40.0000 mg | PREFILLED_SYRINGE | INTRAMUSCULAR | Status: DC
  Administered 2024-06-24 – 2024-06-28 (×5): 40 mg via SUBCUTANEOUS
  Filled 2024-06-23 (×5): qty 0.4

## 2024-06-23 MED ORDER — VANCOMYCIN HCL 2000 MG/400ML IV SOLN
2000.0000 mg | Freq: Once | INTRAVENOUS | Status: AC
  Administered 2024-06-23: 2000 mg via INTRAVENOUS
  Filled 2024-06-23: qty 400

## 2024-06-23 MED ORDER — ONDANSETRON HCL 4 MG PO TABS: 4.0000 mg | ORAL_TABLET | Freq: Four times a day (QID) | ORAL | Status: DC | PRN

## 2024-06-23 MED ORDER — PROPOFOL 10 MG/ML IV BOLUS
INTRAVENOUS | Status: AC
  Filled 2024-06-23: qty 20

## 2024-06-23 MED ORDER — METHOCARBAMOL 1000 MG/10ML IJ SOLN: 500.0000 mg | Freq: Four times a day (QID) | INTRAMUSCULAR | Status: DC | PRN

## 2024-06-23 MED ORDER — OXYCODONE HCL 5 MG PO TABS
ORAL_TABLET | ORAL | Status: AC
  Filled 2024-06-23: qty 1

## 2024-06-23 MED ORDER — TOBRAMYCIN SULFATE 1.2 G IJ SOLR
INTRAMUSCULAR | Status: DC | PRN
  Administered 2024-06-23: 1.2 g via TOPICAL

## 2024-06-23 MED ORDER — METHOCARBAMOL 500 MG PO TABS
500.0000 mg | ORAL_TABLET | Freq: Four times a day (QID) | ORAL | Status: DC | PRN
  Administered 2024-06-25 – 2024-06-26 (×2): 500 mg via ORAL
  Filled 2024-06-23 (×2): qty 1

## 2024-06-23 MED ORDER — PROPOFOL 10 MG/ML IV BOLUS
INTRAVENOUS | Status: DC | PRN
  Administered 2024-06-23: 200 mg via INTRAVENOUS

## 2024-06-23 MED ORDER — OXYCODONE HCL 5 MG/5ML PO SOLN: 5.0000 mg | Freq: Once | ORAL | Status: AC | PRN

## 2024-06-23 MED ORDER — ASPIRIN 81 MG PO TBEC
81.0000 mg | DELAYED_RELEASE_TABLET | Freq: Every day | ORAL | Status: DC
  Administered 2024-06-23 – 2024-06-28 (×6): 81 mg via ORAL
  Filled 2024-06-23 (×6): qty 1

## 2024-06-23 MED ORDER — HYDROMORPHONE HCL 1 MG/ML IJ SOLN: 0.5000 mg | INTRAMUSCULAR | Status: DC | PRN

## 2024-06-23 MED ORDER — LIDOCAINE 2% (20 MG/ML) 5 ML SYRINGE
INTRAMUSCULAR | Status: DC | PRN
  Administered 2024-06-23: 80 mg via INTRAVENOUS

## 2024-06-23 MED ORDER — SODIUM CHLORIDE 0.9 % IR SOLN
Status: DC | PRN
  Administered 2024-06-23: 1000 mL

## 2024-06-23 MED ORDER — FENTANYL CITRATE (PF) 250 MCG/5ML IJ SOLN
INTRAMUSCULAR | Status: DC | PRN
  Administered 2024-06-23 (×3): 50 ug via INTRAVENOUS

## 2024-06-23 MED ORDER — LACTATED RINGERS IV SOLN: INTRAVENOUS | Status: DC

## 2024-06-23 MED ORDER — OYSTER SHELL CALCIUM/D3 500-5 MG-MCG PO TABS
1.0000 | ORAL_TABLET | Freq: Every day | ORAL | Status: DC
Start: 2024-06-23 — End: 2024-06-28
  Administered 2024-06-23 – 2024-06-28 (×6): 1 via ORAL
  Filled 2024-06-23 (×6): qty 1

## 2024-06-23 MED ORDER — ORAL CARE MOUTH RINSE: 15.0000 mL | Freq: Once | OROMUCOSAL | Status: AC

## 2024-06-23 MED ORDER — PIPERACILLIN-TAZOBACTAM 3.375 G IVPB
3.3750 g | Freq: Three times a day (TID) | INTRAVENOUS | Status: DC
  Administered 2024-06-23 – 2024-06-27 (×12): 3.375 g via INTRAVENOUS
  Filled 2024-06-23 (×12): qty 50

## 2024-06-23 MED ORDER — OXYCODONE HCL 5 MG PO TABS
5.0000 mg | ORAL_TABLET | ORAL | Status: DC | PRN
  Administered 2024-06-23 – 2024-06-25 (×2): 10 mg via ORAL
  Filled 2024-06-23 (×5): qty 2

## 2024-06-23 MED ORDER — ACETAMINOPHEN 500 MG PO TABS: 1000.0000 mg | ORAL_TABLET | Freq: Two times a day (BID) | ORAL | Status: DC | PRN

## 2024-06-23 MED ORDER — FENTANYL CITRATE (PF) 100 MCG/2ML IJ SOLN
25.0000 ug | INTRAMUSCULAR | Status: DC | PRN
  Administered 2024-06-23 (×2): 50 ug via INTRAVENOUS

## 2024-06-23 MED ORDER — METOCLOPRAMIDE HCL 5 MG/ML IJ SOLN: 5.0000 mg | Freq: Three times a day (TID) | INTRAMUSCULAR | Status: DC | PRN

## 2024-06-23 MED ORDER — VANCOMYCIN HCL 1250 MG/250ML IV SOLN
1250.0000 mg | INTRAVENOUS | Status: DC
  Administered 2024-06-24 – 2024-06-26 (×3): 1250 mg via INTRAVENOUS
  Filled 2024-06-23 (×3): qty 250

## 2024-06-23 MED ORDER — METOCLOPRAMIDE HCL 5 MG PO TABS: 5.0000 mg | ORAL_TABLET | Freq: Three times a day (TID) | ORAL | Status: DC | PRN

## 2024-06-23 MED ORDER — ACETAMINOPHEN 325 MG PO TABS
325.0000 mg | ORAL_TABLET | Freq: Four times a day (QID) | ORAL | Status: DC | PRN
  Administered 2024-06-25: 650 mg via ORAL
  Filled 2024-06-23: qty 2

## 2024-06-23 MED ORDER — OXYCODONE HCL 5 MG PO TABS
10.0000 mg | ORAL_TABLET | ORAL | Status: DC | PRN
  Administered 2024-06-24 – 2024-06-25 (×2): 10 mg via ORAL
  Administered 2024-06-26: 15 mg via ORAL
  Filled 2024-06-23: qty 3

## 2024-06-23 MED ORDER — FENTANYL CITRATE (PF) 100 MCG/2ML IJ SOLN
INTRAMUSCULAR | Status: AC
  Filled 2024-06-23: qty 2

## 2024-06-23 SURGICAL SUPPLY — 35 items
BAG COUNTER SPONGE SURGICOUNT (BAG) ×1 IMPLANT
CLEANSER WND VASHE INSTL 34OZ (WOUND CARE) IMPLANT
COVER SURGICAL LIGHT HANDLE (MISCELLANEOUS) ×2 IMPLANT
DRAPE IMP U-DRAPE 54X76 (DRAPES) ×1 IMPLANT
DRAPE SURG ORHT 6 SPLT 77X108 (DRAPES) ×2 IMPLANT
DRAPE U-SHAPE 47X51 STRL (DRAPES) ×1 IMPLANT
DRSG ADAPTIC 3X8 NADH LF (GAUZE/BANDAGES/DRESSINGS) ×1 IMPLANT
DRSG MEPILEX POST OP 4X8 (GAUZE/BANDAGES/DRESSINGS) ×1 IMPLANT
DURAPREP 26ML APPLICATOR (WOUND CARE) ×1 IMPLANT
ELECT CAUTERY BLADE 6.4 (BLADE) IMPLANT
ELECTRODE REM PT RTRN 9FT ADLT (ELECTROSURGICAL) IMPLANT
GAUZE PAD ABD 8X10 STRL (GAUZE/BANDAGES/DRESSINGS) ×1 IMPLANT
GAUZE SPONGE 4X4 12PLY STRL (GAUZE/BANDAGES/DRESSINGS) ×1 IMPLANT
GLOVE BIOGEL PI IND STRL 9 (GLOVE) ×1 IMPLANT
GLOVE SURG ORTHO 9.0 STRL STRW (GLOVE) ×1 IMPLANT
GOWN STRL REUS W/ TWL XL LVL3 (GOWN DISPOSABLE) ×2 IMPLANT
GRAFT SKIN WND MICRO 38 (Tissue) IMPLANT
KIT BASIN OR (CUSTOM PROCEDURE TRAY) ×1 IMPLANT
KIT TURNOVER KIT B (KITS) ×1 IMPLANT
MANIFOLD NEPTUNE II (INSTRUMENTS) ×1 IMPLANT
NS IRRIG 1000ML POUR BTL (IV SOLUTION) ×1 IMPLANT
PACK TOTAL JOINT (CUSTOM PROCEDURE TRAY) ×1 IMPLANT
PACK UNIVERSAL I (CUSTOM PROCEDURE TRAY) ×1 IMPLANT
PAD ARMBOARD POSITIONER FOAM (MISCELLANEOUS) ×2 IMPLANT
SET HNDPC FAN SPRY TIP SCT (DISPOSABLE) IMPLANT
SPONGE T-LAP 18X18 ~~LOC~~+RFID (SPONGE) ×1 IMPLANT
STAPLER SKIN PROX 35W (STAPLE) ×1 IMPLANT
SUT ETHIBOND NAB CT1 #1 30IN (SUTURE) ×1 IMPLANT
SUT VIC AB 1 CTX36XBRD ANBCTR (SUTURE) ×1 IMPLANT
SUT VIC AB 2-0 CT1 TAPERPNT 27 (SUTURE) ×1 IMPLANT
SWAB CULTURE ESWAB REG 1ML (MISCELLANEOUS) IMPLANT
TOWEL GREEN STERILE (TOWEL DISPOSABLE) ×1 IMPLANT
TOWEL GREEN STERILE FF (TOWEL DISPOSABLE) ×1 IMPLANT
UNDERPAD 30X36 HEAVY ABSORB (UNDERPADS AND DIAPERS) ×1 IMPLANT
WATER STERILE IRR 1000ML POUR (IV SOLUTION) ×1 IMPLANT

## 2024-06-23 NOTE — Progress Notes (Signed)
 Pharmacy Antibiotic Note  [name] is a 78 y.o. male admitted on 06/23/2024 with THA infection.  Pharmacy has been consulted for Vancomycin  and Zosyn  dosing dosing.  Plan: Vancomycin  2000 mg IV X 1 dose then Vancomycin  1250 mg IV every 24 hours.  Goal trough 15-20 mcg/mL. Zosyn  3.375g IV q8h (4 hour infusion).  Height: 6' 1 (185.4 cm) Weight: 108.9 kg (240 lb) IBW/kg (Calculated) : 79.9  Temp (24hrs), Avg:97.5 F (36.4 C), Min:97.4 F (36.3 C), Max:97.6 F (36.4 C)  Recent Labs  Lab 06/23/24 0820  WBC 5.2  CREATININE 1.42*    Estimated Creatinine Clearance: 54.6 mL/min (A) (by C-G formula based on SCr of 1.42 mg/dL (H)).    Allergies  Allergen Reactions   Adhesive [Tape] Rash    Paper tape is ok    Antimicrobials this admission: Vancomycin  9/17 >>  Zosyn  9/17 >>    Microbiology results: Left hip tissue culture pending  Thank you for allowing pharmacy to be a part of this patient's care.  Larraine Brazier, PharmD Clinical Pharmacist 06/23/2024  3:20 PM **Pharmacist phone directory can now be found on [email or web address] ([email or web address]).  Listed under [facility].

## 2024-06-23 NOTE — Op Note (Signed)
 06/23/2024  11:14 AM  PATIENT:  [name]    PRE-OPERATIVE DIAGNOSIS:  Wound Infection Left Hip  POST-OPERATIVE DIAGNOSIS: Wound infection left hip with septic total hip arthroplasty  PROCEDURE: Excisional debridement soft tissue extra-articular leg and irrigation and debridement intra-articular.  Extra-articular soft tissue and sutures sent for culture sample 1. Intra-articular fluid sent for culture sample 2. Local tissue transfer for wound closure 10 x 4 cm. Application of cleanse choice wound VAC sponge. Application of 38 cm Kerecis micro graft with 1 g vancomycin  powder and 1.2 g tobramycin .  SURGEON:  [name], MD  PHYSICIAN ASSISTANT:None ANESTHESIA:   General  PREOPERATIVE INDICATIONS:  [name] is a  78 y.o. male with a diagnosis of Wound Infection Left Hip who failed conservative measures and elected for surgical management.    The risks benefits and alternatives were discussed with the patient preoperatively including but not limited to the risks of infection, bleeding, nerve injury, cardiopulmonary complications, the need for revision surgery, among others, and the patient was willing to proceed.  OPERATIVE IMPLANTS:   Implant Name Type Inv. Item Serial No. Manufacturer Lot No. LRB No. Used Action  GRAFT SKIN WND MICRO 38 - [id number] Tissue GRAFT SKIN WND MICRO 38  [employer] [id number] Left 1 Implanted    @ENCIMAGES @  OPERATIVE FINDINGS: Patient had septic fluid within the joint that communicated with the soft tissue wound.  OPERATIVE PROCEDURE: Patient brought the operating room and underwent a general anesthetic.  After adequate levels anesthesia obtained patient's left lower extremity was prepped using DuraPrep draped into a sterile field a timeout was called.  An elliptical incision was made around the ulcerative tissue this left the wound that was 10 x 5 cm.  This was carried down through the soft tissue and there was infected soft tissue  with retained sutures and this was removed and sent for cultures.  There was dehiscence of the fascial closure and the wound extended down into the joint.  There was cloudy yellow fluid this was aspirated and sent for cultures.  The intra-articular joint was irrigated with Vashe irrigation the wound was irrigated with Vashe irrigation.  The wound bed was filled with 38 cm of Kerecis micro graft with 1 g vancomycin  powder and 1.2 g of tobramycin  to cover wound surface area greater than 50 cm.  The tissue margins were undermined and local tissue transfer was used to close the wound 10 x 5 cm.  A Prevena peel in place wound VAC was applied this had a good suction fit patient was extubated taken the PACU in stable condition   DISCHARGE PLANNING:  Antibiotic duration: Continue antibiotics adjust antibiotics based on culture sensitivities  Weightbearing: Weightbearing as tolerated  Pain medication: Opioid pathway  Dressing care/ Wound VAC: Wound VAC  Ambulatory devices: Walker or crutches  Discharge to: Discharge planning based on culture results  Follow-up: In the office 1 week post operative.

## 2024-06-23 NOTE — Transfer of Care (Signed)
 Immediate Anesthesia Transfer of Care Note  Patient: [name]  Procedure(s) Performed: IRRIGATION AND DEBRIDEMENT HIP (Left: Hip)  Patient Location: PACU  Anesthesia Type:General  Level of Consciousness: awake  Airway & Oxygen Therapy: Patient Spontanous Breathing  Post-op Assessment: Report given to RN and Post -op Vital signs reviewed and stable  Post vital signs: Reviewed and stable  Last Vitals:  Vitals Value Taken Time  BP 137/69 06/23/24 11:19  Temp 36.4 C 06/23/24 11:19  Pulse 57 06/23/24 11:24  Resp 18 06/23/24 11:24  SpO2 96 % 06/23/24 11:24  Vitals shown include unfiled device data.  Last Pain:  Vitals:   06/23/24 0822  TempSrc:   PainSc: 0-No pain         Complications: No notable events documented.

## 2024-06-23 NOTE — Anesthesia Preprocedure Evaluation (Signed)
 Anesthesia Evaluation  Patient identified by MRN, date of birth, ID band Patient awake    Reviewed: Allergy & Precautions, NPO status , Patient's Chart, lab work & pertinent test results  History of Anesthesia Complications Negative for: history of anesthetic complications  Airway Mallampati: III  TM Distance: >3 FB Neck ROM: Full    Dental  (+) Teeth Intact, Dental Advisory Given   Pulmonary neg shortness of breath, neg sleep apnea, neg COPD, neg recent URI   breath sounds clear to auscultation       Cardiovascular negative cardio ROS  Rhythm:Regular     Neuro/Psych neg Seizures  Neuromuscular disease    GI/Hepatic negative GI ROS, Neg liver ROS,,,  Endo/Other  negative endocrine ROS    Renal/GU Lab Results      Component                Value               Date                      NA                       139                 05/06/2024                K                        5.1                 05/06/2024                CO2                      29                  05/06/2024                GLUCOSE                  95                  05/06/2024                BUN                      17                  05/06/2024                CREATININE               1.09                05/06/2024                CALCIUM                   9.9                 05/06/2024                GFR                      64.65  05/06/2024                EGFR                     76                  12/04/2023                GFRNONAA                 >60                 12/13/2023                Musculoskeletal  (+) Arthritis ,    Abdominal   Peds  Hematology negative hematology ROS (+) Lab Results      Component                Value               Date                      WBC                      5.2                 06/23/2024                HGB                      14.3                06/23/2024                HCT                       47.2                06/23/2024                MCV                      84.0                06/23/2024                PLT                      222                 06/23/2024              Anesthesia Other Findings   Reproductive/Obstetrics                              Anesthesia Physical Anesthesia Plan  ASA: 2  Anesthesia Plan: General   Post-op Pain Management: Ofirmev  IV (intra-op)* and Toradol IV (intra-op)*   Induction: Intravenous  PONV Risk Score and Plan: 3 and Ondansetron  and Dexamethasone   Airway Management Planned: Oral ETT and LMA  Additional Equipment: None  Intra-op Plan:   Post-operative Plan: Extubation in OR  Informed Consent: I have reviewed the patients History and Physical, chart, labs and discussed the procedure including the risks, benefits and alternatives for the proposed anesthesia with the patient or authorized representative who has  indicated his/her understanding and acceptance.     Dental advisory given  Plan Discussed with: CRNA  Anesthesia Plan Comments:         Anesthesia Quick Evaluation

## 2024-06-23 NOTE — Interval H&P Note (Signed)
 History and Physical Interval Note:  06/23/2024 9:31 AM  [name]  has presented today for surgery, with the diagnosis of Wound Infection Left Hip.  The various methods of treatment have been discussed with the patient and family. After consideration of risks, benefits and other options for treatment, the patient has consented to  Procedure(s) with comments: IRRIGATION AND DEBRIDEMENT HIP (Left) - LEFT HIP DEBRIDEMENT as a surgical intervention.  The patient's history has been reviewed, patient examined, no change in status, stable for surgery.  I have reviewed the patient's chart and labs.  Questions were answered to the patient's satisfaction.     [name]

## 2024-06-23 NOTE — Anesthesia Procedure Notes (Signed)
 Procedure Name: LMA Insertion Date/Time: 06/23/2024 10:39 AM  Performed by: Virgil Ee, CRNAPre-anesthesia Checklist: Patient identified, Patient being monitored, Timeout performed, Emergency Drugs available and Suction available Patient Re-evaluated:Patient Re-evaluated prior to induction Oxygen Delivery Method: Circle system utilized Preoxygenation: Pre-oxygenation with 100% oxygen Induction Type: IV induction Ventilation: Mask ventilation without difficulty LMA: LMA inserted LMA Size: 4.0 Tube type: Oral Number of attempts: 1 Placement Confirmation: positive ETCO2 and breath sounds checked- equal and bilateral Tube secured with: Tape Dental Injury: Teeth and Oropharynx as per pre-operative assessment

## 2024-06-24 ENCOUNTER — Encounter (HOSPITAL_COMMUNITY): Payer: Self-pay | Admitting: Orthopedic Surgery

## 2024-06-24 NOTE — Progress Notes (Signed)
 Patient ID: [name], male   DOB: 12/28/1945, 78 y.o.   MRN: [id number] Patient is postoperative day 1 debridement anterior incision for left total hip arthroplasty.  Discussed that there was a superficial infection with retained sutures and this was sent for cultures.  Patient also had deep cloudy fluid within the joint that communicated with the superficial wound.  Both cultures were sent separately.  The superficial cultures are showing staph epi.  Intra-articular culture does not show organisms seen at this time.  I have reviewed his case with Dr. Vernetta.  Does not appear to be a good surgical option for revision of the total hip arthroplasty.  Will consult infectious disease to see if there is a long-term suppressive antibiotic option.  There is no drainage in the wound VAC canister.  Plan for physical therapy weightbearing as tolerated.

## 2024-06-24 NOTE — Progress Notes (Signed)
 Mobility Specialist Progress Note:    06/24/24 1613  Mobility  Activity Ambulated with assistance (In hallway)  Level of Assistance Contact guard assist, steadying assist  Assistive Device Front wheel walker  Distance Ambulated (ft) 175 ft  LLE Weight Bearing Per Provider Order WBAT  Activity Response Tolerated well  Mobility Referral Yes  Mobility visit 1 Mobility  Mobility Specialist Start Time (ACUTE ONLY) 1556  Mobility Specialist Stop Time (ACUTE ONLY) 1610  Mobility Specialist Time Calculation (min) (ACUTE ONLY) 14 min   Received pt in bed and agreeable to mobility. No physical assistance needed. No c/o. Returned to room without fault. Pt requested to use the BR prior to laying down in bed. Voided successfully. Pt left in bed with personal belongings and call light within reach. All needs met.  Lavanda Pollack Mobility Specialist  Please contact via [employer] or  Rehab Office [phone]

## 2024-06-24 NOTE — Evaluation (Signed)
 Physical Therapy Evaluation Patient Details Name: [name] MRN: [id number] DOB: Jun 17, 1946 Today's Date: 06/24/2024  History of Present Illness  [name] is a 78 y.o. male who presented to [facility] hospital 06/23/2024  with a diagnosis of left hip wound infection with septic THA who failed conservative measures and elected for surgical management. Pt s/p I&D and wound vac application. PMHx: OA, DDD, left foot drop, neuropathy, and chronic left hip infection.  Clinical Impression  Pt admitted with above diagnosis. Pt from home with wife where he has been independent despite continued issues with L hip. Pt has L foot drop from initial L hip injury years ago. Does not have AFO or shoes that he normally wears here at the hospital. Was able to ambulate 100' with RW and increased hip hike LLE to compensate. Instructed to use RW instead of cane at this time. Recommend outpt PT when cleared by MD.  Pt currently with functional limitations due to the deficits listed below (see PT Problem List). Pt will benefit from acute skilled PT to increase their independence and safety with mobility to allow discharge.           If plan is discharge home, recommend the following: Assistance with cooking/housework;Assist for transportation   Can travel by private vehicle        Equipment Recommendations None recommended by PT  Recommendations for Other Services       Functional Status Assessment Patient has had a recent decline in their functional status and demonstrates the ability to make significant improvements in function in a reasonable and predictable amount of time.     Precautions / Restrictions Precautions Precautions: Fall Recall of Precautions/Restrictions: Intact Precaution/Restrictions Comments: no recent falls Restrictions Weight Bearing Restrictions Per Provider Order: No LLE Weight Bearing Per Provider Order: Weight bearing as tolerated      Mobility  Bed Mobility Overal bed  mobility: Modified Independent             General bed mobility comments: no diffiulty coming to EOB    Transfers Overall transfer level: Needs assistance Equipment used: Rolling walker (2 wheels) Transfers: Sit to/from Stand Sit to Stand: Contact guard assist           General transfer comment: CGA for safety, vc's for hand placement    Ambulation/Gait Ambulation/Gait assistance: Contact guard assist Gait Distance (Feet): 100 Feet Assistive device: Rolling walker (2 wheels) Gait Pattern/deviations: Step-through pattern Gait velocity: decreased Gait velocity interpretation: >2.62 ft/sec, indicative of community ambulatory   General Gait Details: pt does not have AFO or boots that he wears at home so increased L hip hike with ambulation and decreased wt shift to L. CGA for safety with RW and instructed pt to use RW consistently while wound vac placed to decrease fall risk  Stairs            Wheelchair Mobility     Tilt Bed    Modified Rankin (Stroke Patients Only)       Balance Overall balance assessment: Needs assistance Sitting-balance support: No upper extremity supported, Feet supported Sitting balance-Leahy Scale: Good     Standing balance support: No upper extremity supported, During functional activity Standing balance-Leahy Scale: Fair                               Pertinent Vitals/Pain Pain Assessment Pain Assessment: No/denies pain    Home Living Family/patient expects to be discharged  to:: Private residence Living Arrangements: Spouse/significant other;Children Available Help at Discharge: Family;Available 24 hours/day Type of Home: House Home Access: Ramped entrance     Alternate Level Stairs-Number of Steps: 6 Home Layout: Multi-level Home Equipment: Cane - single point;Grab bars - toilet;Grab bars - tub/shower;Rolling Walker (2 wheels)      Prior Function Prior Level of Function : Independent/Modified Independent              Mobility Comments: active, mows on riding mower ADLs Comments: independent     Extremity/Trunk Assessment   Upper Extremity Assessment Upper Extremity Assessment: Overall WFL for tasks assessed    Lower Extremity Assessment Lower Extremity Assessment: LLE deficits/detail LLE Deficits / Details: strength >3/5 at hip and knee, pt with decreased df at baseline, has had L foot drop since a L hip injury decades ago, swelling and discoloration noted BLE's LLE Sensation: history of peripheral neuropathy LLE Coordination: decreased gross motor    Cervical / Trunk Assessment Cervical / Trunk Assessment: Normal  Communication   Communication Communication: No apparent difficulties    Cognition Arousal: Alert Behavior During Therapy: WFL for tasks assessed/performed   PT - Cognitive impairments: No apparent impairments                         Following commands: Intact       Cueing Cueing Techniques: Verbal cues     General Comments General comments (skin integrity, edema, etc.): given instruction on performing LLE ther ex at knee and any ankle exercises he can complete with his foot drop. But instructed to minimize repetitions of hip exercises right now to decrease friction in hip.    Exercises     Assessment/Plan    PT Assessment Patient needs continued PT services  PT Problem List Decreased strength;Decreased activity tolerance;Decreased balance;Decreased mobility       PT Treatment Interventions DME instruction;Gait training;Stair training;Functional mobility training;Therapeutic activities;Therapeutic exercise;Balance training;Neuromuscular re-education;Patient/family education    PT Goals (Current goals can be found in the Care Plan section)  Acute Rehab PT Goals Patient Stated Goal: return home PT Goal Formulation: With patient Time For Goal Achievement: 07/08/24 Potential to Achieve Goals: Good    Frequency Min 2X/week      Co-evaluation               AM-PAC PT 6 Clicks Mobility  Outcome Measure Help needed turning from your back to your side while in a flat bed without using bedrails?: None Help needed moving from lying on your back to sitting on the side of a flat bed without using bedrails?: None Help needed moving to and from a bed to a chair (including a wheelchair)?: A Little Help needed standing up from a chair using your arms (e.g., wheelchair or bedside chair)?: A Little Help needed to walk in hospital room?: A Little Help needed climbing 3-5 steps with a railing? : A Little 6 Click Score: 20    End of Session Equipment Utilized During Treatment: Gait belt Activity Tolerance: Patient tolerated treatment well Patient left: in chair;with call bell/phone within reach;with chair alarm set;with family/visitor present Nurse Communication: Mobility status PT Visit Diagnosis: Unsteadiness on feet (R26.81);Muscle weakness (generalized) (M62.81);Difficulty in walking, not elsewhere classified (R26.2)    Time: 1137-1209 PT Time Calculation (min) (ACUTE ONLY): 32 min   Charges:   PT Evaluation $PT Eval Moderate Complexity: 1 Mod PT Treatments $Gait Training: 8-22 mins PT General Charges $$ ACUTE PT VISIT: 1 Visit  Richerd Lipoma, PT  Acute Rehab Services Secure chat preferred Office [phone]   Richerd CROME Ralyn Stlaurent 06/24/2024, 1:48 PM

## 2024-06-24 NOTE — Anesthesia Postprocedure Evaluation (Signed)
 Anesthesia Post Note  Patient: [name]  Procedure(s) Performed: IRRIGATION AND DEBRIDEMENT HIP (Left: Hip)     Patient location during evaluation: PACU Anesthesia Type: General Level of consciousness: awake and alert Pain management: pain level controlled Vital Signs Assessment: post-procedure vital signs reviewed and stable Respiratory status: spontaneous breathing, nonlabored ventilation and respiratory function stable Cardiovascular status: blood pressure returned to baseline and stable Postop Assessment: no apparent nausea or vomiting Anesthetic complications: no   No notable events documented.                  Jakobee Brackins

## 2024-06-24 NOTE — Plan of Care (Signed)

## 2024-06-24 NOTE — Plan of Care (Signed)
   Problem: Education: Goal: Knowledge of General Education information will improve Description Including pain rating scale, medication(s)/side effects and non-pharmacologic comfort measures Outcome: Progressing   Problem: Health Behavior/Discharge Planning: Goal: Ability to manage health-related needs will improve Outcome: Progressing

## 2024-06-25 LAB — BASIC METABOLIC PANEL WITH GFR
Anion gap: 5 (ref 5–15)
BUN: 27 mg/dL — ABNORMAL HIGH (ref 8–23)
CO2: 23 mmol/L (ref 22–32)
Calcium: 9.4 mg/dL (ref 8.9–10.3)
Chloride: 107 mmol/L (ref 98–111)
Creatinine, Ser: 1.29 mg/dL — ABNORMAL HIGH (ref 0.61–1.24)
GFR, Estimated: 56 mL/min — ABNORMAL LOW (ref >60)
Glucose, Bld: 100 mg/dL — ABNORMAL HIGH (ref 70–99)
Potassium: 4.3 mmol/L (ref 3.5–5.1)
Sodium: 135 mmol/L (ref 135–145)

## 2024-06-25 NOTE — Care Management Important Message (Signed)
 Important Message  Patient Details  Name: Bransen ADAMCZAK MRN: [id number] Date of Birth: [birth year]   Important Message Given:  Yes - Medicare IM     Jon Cruel 06/25/2024, 3:42 PM

## 2024-06-25 NOTE — Progress Notes (Signed)
 Mobility Specialist Progress Note:    06/25/24 0939  Mobility  Activity Ambulated with assistance (In hallway)  Level of Assistance Contact guard assist, steadying assist  Assistive Device Front wheel walker  Distance Ambulated (ft) 270 ft  LLE Weight Bearing Per Provider Order WBAT  Activity Response Tolerated well  Mobility Referral Yes  Mobility visit 1 Mobility  Mobility Specialist Start Time (ACUTE ONLY) 0919  Mobility Specialist Stop Time (ACUTE ONLY) 0935  Mobility Specialist Time Calculation (min) (ACUTE ONLY) 16 min   Received pt in bed and agreeable to mobility. No physical assistance needed. No c/o. Returned to room without fault. Pt requested to brush teeth. Pt left in chair with alarm on. Personal belongings and call light within reach. All needs met.  Lavanda Pollack Mobility Specialist  Please contact via [employer] or  Rehab Office [phone]

## 2024-06-25 NOTE — Progress Notes (Signed)
 Patient ID: [name], male   DOB: [birth year], 78 y.o.   MRN: [id number] Patient is status post debridement left hip.  The superficial cultures are showing staph epi and the deep cultures are showing Staph aureus.  Sensitivities pending.  There is 50 cc in the wound VAC canister.

## 2024-06-26 NOTE — Progress Notes (Signed)
 Mobility Specialist Progress Note   06/26/24 1104  Mobility  Activity Ambulated with assistance  Level of Assistance Modified independent, requires aide device or extra time  Assistive Device Front wheel walker  Distance Ambulated (ft) 180 ft  Range of Motion/Exercises Active;All extremities  LLE Weight Bearing Per Provider Order WBAT  Activity Response Tolerated well  Mobility Specialist Start Time (ACUTE ONLY) 1104  Mobility Specialist Stop Time (ACUTE ONLY) 1119  Mobility Specialist Time Calculation (min) (ACUTE ONLY) 15 min   Patient received dangling EOB, eager to participate. Ambulated mod I with slow steady gait. Returned to room without complaint or incident. Was left in supine with all needs met, call bell in reach.   [name], BS EXP Mobility Specialist Please contact via [employer] or Rehab office at [phone]

## 2024-06-26 NOTE — Consult Note (Signed)
 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  Or findings septic fluid within the joint that communicated with the soft tissue wound. Cx from left hip tissue MRSE ( infected soft tissue). Cx from intraarticular fluid staph aureus  - Discussed with Dr. Harden on 9/20.  Patient does not want to prosthetic left hip for source control at prefers conservative management.   Recommendations  - DC vancomycin  and Zosyn , will start IV daptomycin  to target Staph aureus and Staph epidermidis  - Follow-up blood cultures for final antibiotic pending sensitivity of staph aureus - Needs PICC line for 6 weeks of antibiotics followed by suppression thereafter - Monitor CBC, repeat CPK - Universal/standard isolation precautions Communicated to primary Dr Overton to start following from 9/22  Rest of the management as per the primary team. Please call with questions or concerns.  Thank you for the consult  __________________________________________________________________________________________________________ HPI and Hospital Course: 78 year old male with h/o chronic venous insufficiency, prostate cancer, HLD, prediabetes, left hip  arthroplasty initially 2008 complicated by hardware failure s/p revision 2017 found to have MSSA infection subsequently underwent another revision in 2019 and then washout 2021 with retention of hardware on suppressive cephalexin ,  s/p I&D on 01/03/2023 ( cx MSSA) s/p IV prolonged course of IV cefazolin > PO cefadroxil , s/p I and D on 08/15/23 and then 09/19/23 ( no cx sent) for recurrent seroma of left posterior thigh ( continued on PO cefadroxil ) then, most recently I and D of left hip on 12/12/23 ( cx MRSA, HOWEVER continued on PO cefadroxil ) who is electively admitted on 9/16 for increased drainage of chronic draining wound over the anterior hip incision of left hip.   Reports compliance with p.o. cefadroxil  as instructed prior to admission.  Denies fever, chills, nausea vomiting or diarrhea  ROS: General- Denies fever, chills, loss of appetite and loss of weight HEENT - Denies headache, blurry vision, neck pain, sinus pain Chest - Denies any chest pain, SOB or cough CVS- Denies any dizziness/lightheadedness, syncopal attacks, palpitations Abdomen- Denies any nausea, vomiting, abdominal pain, hematochezia and diarrhea Neuro - Denies any weakness, numbness, tingling sensation Psych - Denies any changes in mood irritability or depressive symptoms GU- Denies any burning, dysuria, hematuria or increased frequency of urination Skin - denies any rashes/lesions MSK - denies any joint pain/swelling or restricted ROM   Past Medical History:  Diagnosis Date   Anterior dislocation of right shoulder 09/2013  Reduced in ED under sedation.  Dislocated posteriorly 2015, ? partial RC tear, has f/u planned with Dr. Addie--? surgery? possible plan for 2016.   Blood donor, whole blood    Has donated approx 30 gallons in his lifetime   Chronic left hip pain    Severe DJD.  THA 2008.  Left acetabular revision in 2017 AND 2019.   Chronic pain syndrome    Candidate for spinal cord stimulator (03/2017)   Chronic  renal insufficiency, stage II (mild)    Chronic venous insufficiency    +compression hose   DDD (degenerative disc disease), lumbar 2017   Dr. Alvah ortho.  He is now wearing a new leg brace.  Has had ESI and will get another.   Diverticulosis    +redundant colon   DVT (deep venous thrombosis) (HCC) 06/14/2016   provoked, bilateral LL's; in post-op setting s/p hip surgery.  Xarelto  x 44mo at full dosing, then 6 more months at 1/2 dosing, then stopped xarelto .     Foot drop, left    s/p hip fracture 1969   History of blood transfusion    Hx of adenomatous colonic polyps    polypectomy 2007; 2012; 2015; 05/2019; recall 16yrs   Hypercholesterolemia 02/2017   Recommended statin 02/10/17 but pt declined, wanted to do TLC trial first. Rec'd statin 04/2020   Neuropathy    Osteoarthritis    knees and hips primarily   Phlebitis    Post laminectomy syndrome    improved with spinal cord stim-->stim to be removed summer/fall 2019.   Prediabetes    04/2023 fasting 111, a1c 5.7%.  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 Recurrence 2020->started eligard  and apalutamide . PSA 0.16 Feb 2020 urol f/u. [id number] Aug 2021 urol. <0.015 02/2021.   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; 02/2020   MSSA; admitted for I&D 02/11/20->plan for longterm IV abx after   Urge incontinence    vesicare helpful   Past Surgical History:  Procedure Laterality Date   ANTERIOR HIP REVISION Left 06/07/2016   Procedure: LEFT ANTERIOR HIP ACETABULAR REVISION;  Surgeon: [name], MD;  Location: [facility];  Service: Orthopedics;  Laterality: Left;   ANTERIOR HIP REVISION Left 10/09/2017   Procedure: Left hip acetabular revision;  Surgeon: Poli Lonni CINDERELLA, MD;  Location: [facility];  Service: Orthopedics;  Laterality: Left;   bilateral carpal tunnel release  2003   CARPAL TUNNEL RELEASE Bilateral     COLONOSCOPY  2007;2012;2015;05/2019   04/12/2014 Tubular adenoma x 1; 05/2019 adenomatous polyp; recall 5 yrs.   FRACTURE SURGERY Left 1969   hip, traction hospitalized for 6 weeks   INCISION AND DRAINAGE Left 08/15/2023   Procedure: INCISION AND DRAINAGE OF SEROMA LEFT THIGH;  Surgeon: Poli Lonni CINDERELLA, MD;  Location: [facility];  Service: Orthopedics;  Laterality: Left;   INCISION AND DRAINAGE Left 09/19/2023   Procedure: INCISION AND DRAINAGE LEFT THIGH WITH WOUND VAC PLACEMENT;  Surgeon: Poli Lonni CINDERELLA, MD;  Location: [facility];  Service: Orthopedics;  Laterality: Left;   INCISION AND DRAINAGE HIP Left 02/11/2020   Procedure: IRRIGATION AND DEBRIDEMENT LEFT HIP WITH ANTIBIOTIC SPACER PLACEMENT;  Surgeon: Poli Lonni CINDERELLA, MD;  Location: [facility];  Service: Orthopedics;  Laterality: Left;   INCISION AND DRAINAGE HIP Left 01/03/2023   Procedure: IRRIGATION AND DEBRIDEMENT LEFT HIP;  Surgeon: Poli Lonni CINDERELLA, MD;  Location: [facility];  Service: Orthopedics;  Laterality: Left;   INCISION AND DRAINAGE HIP Left 12/12/2023   Procedure: IRRIGATION AND DEBRIDEMENT HIP;  Surgeon: Vernetta Lonni GRADE, MD;  Location: [facility];  Service: Orthopedics;  Laterality: Left;  Irrigation and Debridement left hip   INCISION AND DRAINAGE HIP Left 06/23/2024   Procedure: IRRIGATION AND DEBRIDEMENT HIP;  Surgeon: Harden Jerona GAILS, MD;  Location: [facility] OR;  Service: Orthopedics;  Laterality: Left;  LEFT HIP DEBRIDEMENT   LUMBAR LAMINECTOMY/DECOMPRESSION MICRODISCECTOMY Left 03/28/2016   Patient denies: Procedure: Left Lumbar three-four Laminoforaminotomy;  Surgeon: Fairy Levels, MD;  Location: [facility];  Service: Neurosurgery;  Laterality: Left;  left   POLYPECTOMY     RADIOACTIVE SEED IMPLANT  2012   ROTATOR CUFF REPAIR  2006   right   SHOULDER ARTHROSCOPY Right    SPINAL CORD STIMULATOR IMPLANT  06/2017   removed a year after was putted in.   SPINAL CORD STIMULATOR REMOVAL     TOTAL HIP  ARTHROPLASTY  2008   left ([place] [employer])   VASECTOMY     Scheduled Meds:  aspirin  EC  81 mg Oral Daily   atorvastatin   20 mg Oral Daily   calcium -vitamin D  1 tablet Oral Daily   docusate sodium   100 mg Oral BID   enoxaparin  (LOVENOX ) injection  40 mg Subcutaneous Q24H   fesoterodine   8 mg Oral Daily   Continuous Infusions:  piperacillin -tazobactam (ZOSYN )  IV 3.375 g (06/26/24 1227)   vancomycin  1,250 mg (06/25/24 1734)   PRN Meds:.acetaminophen , HYDROmorphone  (DILAUDID ) injection, methocarbamol  **OR** methocarbamol  (ROBAXIN ) injection, metoCLOPramide  **OR** metoCLOPramide  (REGLAN ) injection, ondansetron  **OR** ondansetron  (ZOFRAN ) IV, oxyCODONE , oxyCODONE   Allergies  Allergen Reactions   Adhesive [Tape] Rash    Paper tape is ok   Social History   Socioeconomic History   Marital status: Married    Spouse name: Not on file   Number of children: Not on file   Years of education: Not on file   Highest education level: Not on file  Occupational History   Not on file  Tobacco Use   Smoking status: Never   Smokeless tobacco: Never  Vaping Use   Vaping status: Never Used  Substance and Sexual Activity   Alcohol use: No   Drug use: No   Sexual activity: Not Currently  Other Topics Concern   Not on file  Social History Narrative   Married, 2 biologic children, 2 adopted, 5 GC.   Lives in [place].  Worked 35 yrs at [employer].   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   No formal exercise.  No T/A/Ds.   Enjoys woodworking.   Social Drivers of Corporate investment banker Strain: Low Risk  (03/31/2024)   Overall Financial Resource Strain (CARDIA)    Difficulty of Paying Living Expenses: Not hard at all  Food Insecurity: No Food Insecurity (06/23/2024)   Hunger Vital Sign    Worried About Running Out of Food in the Last Year: Never true    Ran Out of Food in the Last Year: Never true  Transportation Needs: No Transportation Needs (06/23/2024)   PRAPARE -  Administrator, Civil Service (Medical): No    Lack of Transportation (Non-Medical): No  Physical Activity: Insufficiently Active (03/31/2024)   Exercise Vital Sign    Days of Exercise per Week: 4 days    Minutes of Exercise per Session: 30 min  Stress: No Stress Concern Present (03/31/2024)   [employer] of  Occupational Health - Occupational Stress Questionnaire    Feeling of Stress: Not at all  Social Connections: Socially Integrated (06/23/2024)   Social Connection and Isolation Panel    Frequency of Communication with Friends and Family: Three times a week    Frequency of Social Gatherings with Friends and Family: More than three times a week    Attends Religious Services: More than 4 times per year    Active Member of [employer] or Organizations: Yes    Attends Engineer, structural: More than 4 times per year    Marital Status: Married  Catering manager Violence: Not At Risk (06/23/2024)   Humiliation, Afraid, Rape, and Kick questionnaire    Fear of Current or Ex-Partner: No    Emotionally Abused: No    Physically Abused: No    Sexually Abused: No   Family History  Problem Relation Age of Onset   Lung cancer Mother    Heart disease Father    Colon cancer Neg Hx    Esophageal cancer Neg Hx    Rectal cancer Neg Hx    Stomach cancer Neg Hx    Colon polyps Neg Hx     Vitals BP 118/70 (BP Location: Left Arm)   Pulse (!) 51   Temp 98.4 F (36.9 C) (Oral)   Resp 18   Ht 6' 1 (1.854 m)   Wt 108.9 kg   SpO2 95%   BMI 31.66 kg/m    Physical Exam Constitutional: Adult male lying in the bed    comments: HEENT WNL.  Not in acute distress  Cardiovascular:     Rate and Rhythm: Normal rate and regular rhythm.     Heart sounds: S1 and S2  Pulmonary:     Effort: Pulmonary effort is normal.     Comments: Normal breath sounds  Abdominal:     Palpations: Abdomen is soft.     Tenderness: Nondistended and nontender  Musculoskeletal:        General: No  swelling or tenderness in peripheral joints  Left hip with a wound VAC, intact with no signs of surrounding cellulitis  Skin:    Comments: No rashes  Neurological:     General: Awake, alert and oriented, grossly  Psychiatric:        Mood and Affect: Mood normal.   Pertinent Microbiology Results for orders placed or performed during the hospital encounter of 06/23/24  Aerobic/Anaerobic Culture w Gram Stain (surgical/deep wound)     Status: None (Preliminary result)   Collection Time: 06/23/24 10:02 AM   Specimen: PATH Soft tissue  Result Value Ref Range Status   Specimen Description TISSUE  Final   Special Requests LEFT HIP TISSUE  Final   Gram Stain   Final    RARE WBC PRESENT,BOTH PMN AND MONONUCLEAR NO ORGANISMS SEEN Performed at [facility], [address], [place], [place] [address]    Culture   Final    RARE STAPHYLOCOCCUS EPIDERMIDIS NO ANAEROBES ISOLATED; CULTURE IN PROGRESS FOR 5 DAYS    Report Status PENDING  Incomplete   Organism ID, Bacteria STAPHYLOCOCCUS EPIDERMIDIS  Final      Susceptibility   Staphylococcus epidermidis - MIC*    CIPROFLOXACIN Value in next row Sensitive      SENSITIVEMIC <=0.5    ERYTHROMYCIN Value in next row Sensitive      SENSITIVEMIC <=0.25    GENTAMICIN Value in next row Sensitive      SENSITIVEMIC <= 0.5  OXACILLIN Value in next row Resistant      RESISTANTMIC >= 4    TETRACYCLINE Value in next row Sensitive      SENSITIVEMIC <=1    VANCOMYCIN  Value in next row Sensitive      SENSITIVEMIC 1    TRIMETH /SULFA  Value in next row Sensitive      SENSITIVEMIC <=10    CLINDAMYCIN Value in next row Sensitive      SENSITIVEMIC <=0.25    RIFAMPIN  Value in next row Sensitive      SENSITIVEMIC <=0.5    Inducible Clindamycin Value in next row Sensitive      SENSITIVEMIC <=0.5    * RARE STAPHYLOCOCCUS EPIDERMIDIS  Aerobic/Anaerobic Culture w Gram Stain (surgical/deep wound)     Status: None (Preliminary result)   Collection Time:  06/23/24 10:58 AM   Specimen: PATH Soft tissue  Result Value Ref Range Status   Specimen Description FLUID  Final   Special Requests INTRA ARTICULAR FLUID  Final   Gram Stain   Final    MODERATE WBC PRESENT, PREDOMINANTLY PMN NO ORGANISMS SEEN Performed at [facility], [address], [place], [place] [address]    Culture   Final    RARE STAPHYLOCOCCUS AUREUS CONFIRMATION OF SUSCEPTIBILITIES IN PROGRESS NO ANAEROBES ISOLATED; CULTURE IN PROGRESS FOR 5 DAYS    Report Status PENDING  Incomplete    Pertinent Lab seen by me:    Latest Ref Rng & Units 06/23/2024    8:20 AM 05/06/2024    8:15 AM 12/14/2023    5:09 AM  CBC  WBC 4.0 - 10.5 K/uL 5.2  5.7  5.7   Hemoglobin 13.0 - 17.0 g/dL 85.6  86.9  89.7   Hematocrit 39.0 - 52.0 % 47.2  41.5  34.1   Platelets 150 - 400 K/uL 222  251.0  216       Latest Ref Rng & Units 06/25/2024    5:16 AM 06/23/2024    8:20 AM 05/06/2024    8:15 AM  CMP  Glucose 70 - 99 mg/dL 899  96  95   BUN 8 - 23 mg/dL 27  22  17    Creatinine 0.61 - 1.24 mg/dL 8.70  8.57  8.90   Sodium 135 - 145 mmol/L 135  137  139   Potassium 3.5 - 5.1 mmol/L 4.3  4.9  5.1   Chloride 98 - 111 mmol/L 107  108  106   CO2 22 - 32 mmol/L 23  19  29    Calcium  8.9 - 10.3 mg/dL 9.4  9.5  9.9   Total Protein 6.5 - 8.1 g/dL  6.5  6.6   Total Bilirubin 0.0 - 1.2 mg/dL  0.8  0.5   Alkaline Phos 38 - 126 U/L  78  91   AST 15 - 41 U/L  22  12   ALT 0 - 44 U/L  14  11      Pertinent Imagings/Other Imagings Plain films and CT images have been personally visualized and interpreted; radiology reports have been reviewed. Decision making incorporated into the Impression / Recommendations.  No results found.  I spent 89  minutes involved in face-to-face and non-face-to-face activities for this patient on the day of the visit. Professional time spent includes the following activities: Preparing to see the patient (review of tests), Obtaining and reviewing separately obtained  history (H&P, OR note, prior notes from Dr. Alena), Performing a medically appropriate examination and evaluation , Ordering medications/labs,  referring and communicating with other health care professionals including orthopedics, Documenting clinical information in the EMR, Independently interpreting results (not separately reported), Communicating results to the patient, Counseling and educating the patient and Care coordination (not separately reported).  Electronically signed by:   Plan d/w requesting provider as well as ID pharm D  Of note, portions of this note may have been created with voice recognition software. While this note has been edited for accuracy, occasional wrong-word or 'sound-a-like' substitutions may have occurred due to the inherent limitations of voice recognition software.   Annalee Orem, MD Infectious Disease Physician [facility] Pager: [phone]

## 2024-06-26 NOTE — Progress Notes (Signed)
 Patient ID: [name], male   DOB: 01-Jul-1946, 78 y.o.   MRN: [id number] Patient is status post debridement of a superficial and deep infection left total hip arthroplasty.  Patient has undergone multiple surgical debridements and hardware exchange.  The superficial cultures are showing staph epi in the deep cultures are showing Staph aureus.  There is 100 cc in the wound VAC canister.  Will consult infectious disease for long-term antibiotic management.

## 2024-06-26 NOTE — Plan of Care (Signed)

## 2024-06-27 DIAGNOSIS — B957 Other staphylococcus as the cause of diseases classified elsewhere: Secondary | ICD-10-CM

## 2024-06-27 DIAGNOSIS — Z96649 Presence of unspecified artificial hip joint: Secondary | ICD-10-CM | POA: Diagnosis not present

## 2024-06-27 DIAGNOSIS — T8459XS Infection and inflammatory reaction due to other internal joint prosthesis, sequela: Secondary | ICD-10-CM

## 2024-06-27 LAB — CK: Total CK: 31 U/L — ABNORMAL LOW (ref 49–397)

## 2024-06-27 MED ORDER — DAPTOMYCIN-SODIUM CHLORIDE 700-0.9 MG/100ML-% IV SOLN
6.0000 mg/kg | Freq: Every day | INTRAVENOUS | Status: DC
  Administered 2024-06-27: 700 mg via INTRAVENOUS
  Filled 2024-06-27 (×2): qty 100

## 2024-06-27 NOTE — Progress Notes (Signed)
 The patients wound vac has been having technical issues,it kept on beeping displaying that its blocked and the the vac suction kept on fluctuating  to as low as 25 mm hg tried to fix it with charge nurse in vain.At 0311 tried to reach out to the Doctor, secretary picked up the call promised to call back,still waiting for feed back.

## 2024-06-27 NOTE — Plan of Care (Signed)

## 2024-06-27 NOTE — Progress Notes (Signed)
 Pharmacy Antibiotic Note  [name] is a 78 y.o. male admitted on 06/23/2024 with PJI Pharmacy has been consulted for Daptomycin  dosing.  78 yo male being treated for PJI. Tissue cultures MRSE and staph aureus with sensitivities pending. Id consulted. Vancomycin  and zosyn  to be d/c'd and Daptomycin  started per ID.   Plan: Daptomycin  6mg /kg IV q24h CK in AM and weekly starting 9/29   Height: 6' 1 (185.4 cm) Weight: 108.9 kg (240 lb) IBW/kg (Calculated) : 79.9  Temp (24hrs), Avg:98.2 F (36.8 C), Min:97.9 F (36.6 C), Max:98.5 F (36.9 C)  Recent Labs  Lab 06/23/24 0820 06/25/24 0516  WBC 5.2  --   CREATININE 1.42* 1.29*    Estimated Creatinine Clearance: 60.1 mL/min (A) (by C-G formula based on SCr of 1.29 mg/dL (H)).    Allergies  Allergen Reactions   Adhesive [Tape] Rash    Paper tape is ok    Antimicrobials this admission: 9/17 Vancomycin  >> 9/21 9/17 Zosyn  >> 9/21 9/21 Daptomycin     Microbiology results: 9/17 Tissue Cx: MRSE and Staph aureus sensitivities pending   Celester Morgan A. Lyle, PharmD, BCPS, FNKF Clinical Pharmacist  Please utilize Amion for appropriate phone number to reach the unit pharmacist [facility])  06/27/2024 11:32 AM

## 2024-06-27 NOTE — Progress Notes (Signed)
 Patient stable.  Pain is controlled.  He is not really having much pain.  Has about 50 cc in the wound VAC.  Staph epi and staph aureus growing.  ID consult performed.  PICC line tomorrow.  Likely discharge tomorrow.

## 2024-06-28 ENCOUNTER — Other Ambulatory Visit: Payer: Self-pay

## 2024-06-28 ENCOUNTER — Other Ambulatory Visit (HOSPITAL_COMMUNITY): Payer: Self-pay

## 2024-06-28 DIAGNOSIS — T8459XD Infection and inflammatory reaction due to other internal joint prosthesis, subsequent encounter: Secondary | ICD-10-CM | POA: Diagnosis not present

## 2024-06-28 DIAGNOSIS — Z96649 Presence of unspecified artificial hip joint: Secondary | ICD-10-CM | POA: Diagnosis not present

## 2024-06-28 LAB — BASIC METABOLIC PANEL WITH GFR
Anion gap: 8 (ref 5–15)
BUN: 22 mg/dL (ref 8–23)
CO2: 23 mmol/L (ref 22–32)
Calcium: 9.8 mg/dL (ref 8.9–10.3)
Chloride: 105 mmol/L (ref 98–111)
Creatinine, Ser: 1.28 mg/dL — ABNORMAL HIGH (ref 0.61–1.24)
GFR, Estimated: 57 mL/min — ABNORMAL LOW (ref >60)
Glucose, Bld: 114 mg/dL — ABNORMAL HIGH (ref 70–99)
Potassium: 4.3 mmol/L (ref 3.5–5.1)
Sodium: 136 mmol/L (ref 135–145)

## 2024-06-28 LAB — AEROBIC/ANAEROBIC CULTURE W GRAM STAIN (SURGICAL/DEEP WOUND)

## 2024-06-28 MED ORDER — OXYCODONE HCL 5 MG PO TABS
5.0000 mg | ORAL_TABLET | Freq: Four times a day (QID) | ORAL | 0 refills | Status: DC | PRN
  Filled 2024-06-28: qty 30, 7d supply, fill #0

## 2024-06-28 MED ORDER — DAPTOMYCIN-SODIUM CHLORIDE 700-0.9 MG/100ML-% IV SOLN
8.0000 mg/kg | Freq: Every day | INTRAVENOUS | Status: DC
  Administered 2024-06-28: 700 mg via INTRAVENOUS
  Filled 2024-06-28: qty 100

## 2024-06-28 MED ORDER — METHOCARBAMOL 500 MG PO TABS
500.0000 mg | ORAL_TABLET | Freq: Four times a day (QID) | ORAL | 0 refills | Status: AC | PRN
  Filled 2024-06-28: qty 30, 8d supply, fill #0

## 2024-06-28 MED ORDER — CHLORHEXIDINE GLUCONATE CLOTH 2 % EX PADS: 6.0000 | MEDICATED_PAD | Freq: Every day | CUTANEOUS | Status: DC

## 2024-06-28 MED ORDER — SODIUM CHLORIDE 0.9% FLUSH: 10.0000 mL | Freq: Two times a day (BID) | INTRAVENOUS | Status: DC

## 2024-06-28 MED ORDER — DAPTOMYCIN-SODIUM CHLORIDE 700-0.9 MG/100ML-% IV SOLN
700.0000 mg | Freq: Every day | INTRAVENOUS | 0 refills | Status: DC
  Filled 2024-06-28: qty 3000, 30d supply, fill #0

## 2024-06-28 MED ORDER — DAPTOMYCIN IV (FOR PTA / DISCHARGE USE ONLY): 700.0000 mg | INTRAVENOUS | 0 refills | Status: AC

## 2024-06-28 MED ORDER — SODIUM CHLORIDE 0.9% FLUSH: 10.0000 mL | INTRAVENOUS | Status: DC | PRN

## 2024-06-28 NOTE — Plan of Care (Signed)

## 2024-06-28 NOTE — Progress Notes (Signed)
 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  1 surgical specimen growing Staphylococcus epidermidis with the other growing Staphylococcus aureus.    [name] is postop day #5 with Staphylococcus aureus sensitivities pending.  Given history of MSSA and MRSA discussed plan of care to continue with current dose of daptomycin  x 6 weeks and then suppression thereafter with doxycycline .  Continue to monitor sensitivities for Staphylococcus aureus surgical specimens.  Postoperative wound care per orthopedics.  Home health/OPAT orders.  Place PICC line.  Okay for discharge from ID standpoint and will arrange outpatient follow-up.  Remaining medical and supportive care per primary team.  PLAN:   Continue current dose of daptomycin  through 08/04/2024. Postoperative wound care per orthopedics. Monitor cultures for Staphylococcus aureus sensitivities Home health/OPAT orders. Place PICC line. Okay for discharge once home health arranged and PICC placed.  Follow up outpatient.  Remaining medical and supportive care per Primary Team.   Diagnosis:  Left hip prosthetic joint infection   Culture Result: Staphylococcus aureus / Staphylococcus epidermidis   Allergies  Allergen Reactions   Adhesive [Tape] Rash    Paper tape is ok    OPAT Orders Discharge antibiotics to be given via PICC line Discharge antibiotics: Daptomycin  700 mg IV q 24  Per pharmacy protocol   Duration: 6 weeks  End Date: 08/04/24  [facility]  Care Per Protocol:  Home health RN for IV administration and teaching; PICC line care and labs.    Labs weekly while on IV antibiotics: _X_ CBC with differential _X_ BMP __ CMP _X_ CRP _X_ ESR __ Vancomycin  trough _X_ CK  _X_ Please pull PIC at completion of IV antibiotics __ Please leave PIC in place until doctor has seen patient or been notified  Fax weekly labs to [phone]  Clinic Follow Up Appt:  07/22/24 @ 8:40 am with Dr. 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 Tolerating antibiotics with no adverse side effects.   Allergies  Allergen Reactions   Adhesive [Tape] Rash    Paper tape is ok     Review of Systems: Review of Systems  Constitutional:  Negative for chills, fever and weight loss.  Respiratory:  Negative for cough, shortness of breath and wheezing.   Cardiovascular:  Negative for chest pain and leg swelling.  Gastrointestinal:  Negative for abdominal pain, constipation, diarrhea, nausea and vomiting.  Skin:  Negative for rash.      OBJECTIVE: Vitals:   06/27/24 1446 06/27/24 1930 06/28/24 0430  06/28/24 0730  BP: 135/63 124/62 122/64 115/66  Pulse: (!) 54 (!) 58 (!) 55 (!) 56  Resp: 18 18 15 17   Temp:  98.2 F (36.8 C) 98.2 F (36.8 C) 97.8 F (36.6 C)  TempSrc:    Oral  SpO2: 98% 94% 96% 97%  Weight:      Height:       Body mass index is 31.66 kg/m.  Physical Exam Constitutional:      General: He is not in acute distress.    Appearance: He is well-developed.     Comments: Seated in the chair beside the bed; pleasant.    Cardiovascular:     Rate and Rhythm: Normal rate and regular rhythm.     Heart sounds: Normal heart sounds.  Pulmonary:     Effort: Pulmonary effort is normal.     Breath sounds: Normal breath sounds.  Skin:    General: Skin is warm and dry.  Neurological:     Mental Status: He is alert and oriented to person, place, and time.     Lab Results Lab Results  Component Value Date   WBC 5.2 06/23/2024   HGB 14.3 06/23/2024   HCT 47.2 06/23/2024   MCV 84.0 06/23/2024   PLT 222 06/23/2024    Lab Results  Component Value Date   CREATININE 1.28 (H) 06/28/2024   BUN 22 06/28/2024   NA 136 06/28/2024   K 4.3 06/28/2024   CL 105 06/28/2024   CO2 23 06/28/2024    Lab Results  Component Value Date   ALT 14 06/23/2024   AST 22 06/23/2024   ALKPHOS 78 06/23/2024   BILITOT 0.8 06/23/2024     Microbiology: Recent Results (from the past 240 hours)  Aerobic/Anaerobic Culture w Gram Stain (surgical/deep wound)     Status: None (Preliminary result)   Collection Time: 06/23/24 10:02 AM   Specimen: PATH Soft tissue  Result Value Ref Range Status   Specimen Description TISSUE  Final   Special Requests LEFT HIP TISSUE  Final   Gram Stain   Final    RARE WBC PRESENT,BOTH PMN AND MONONUCLEAR NO ORGANISMS SEEN Performed at [facility], [address], [place], [place] [address]    Culture   Final    RARE STAPHYLOCOCCUS EPIDERMIDIS NO ANAEROBES ISOLATED; CULTURE IN PROGRESS FOR 5 DAYS    Report Status PENDING  Incomplete   Organism ID, Bacteria STAPHYLOCOCCUS EPIDERMIDIS  Final      Susceptibility   Staphylococcus epidermidis - MIC*    CIPROFLOXACIN Value in next row Sensitive      SENSITIVEMIC <=0.5    ERYTHROMYCIN Value in next row Sensitive      SENSITIVEMIC <=0.25    GENTAMICIN Value in next row Sensitive      SENSITIVEMIC <= 0.5    OXACILLIN Value in next row Resistant      RESISTANTMIC >= 4    TETRACYCLINE Value in next row Sensitive      SENSITIVEMIC <=1     VANCOMYCIN  Value in next row Sensitive      SENSITIVEMIC 1    TRIMETH /SULFA  Value in next row Sensitive      SENSITIVEMIC <=10    CLINDAMYCIN Value in next row Sensitive      SENSITIVEMIC <=0.25    RIFAMPIN  Value in next row Sensitive      SENSITIVEMIC <=0.5    Inducible Clindamycin Value in next row Sensitive      SENSITIVEMIC <=0.5    *  RARE STAPHYLOCOCCUS EPIDERMIDIS  Aerobic/Anaerobic Culture w Gram Stain (surgical/deep wound)     Status: None (Preliminary result)   Collection Time: 06/23/24 10:58 AM   Specimen: PATH Soft tissue  Result Value Ref Range Status   Specimen Description FLUID  Final   Special Requests INTRA ARTICULAR FLUID  Final   Gram Stain   Final    MODERATE WBC PRESENT, PREDOMINANTLY PMN NO ORGANISMS SEEN Performed at [facility], [address], [place], [place] [address]    Culture   Final    RARE STAPHYLOCOCCUS AUREUS CONFIRMATION OF SUSCEPTIBILITIES IN PROGRESS NO ANAEROBES ISOLATED; CULTURE IN PROGRESS FOR 5 DAYS    Report Status PENDING  Incomplete     Cathlyn July, NP [facility]  06/28/2024  2:49 PM

## 2024-06-28 NOTE — Progress Notes (Signed)
 Patient ID: [name], male   DOB: 18-May-1946, 78 y.o.   MRN: [id number] Infectious disease has recommended 6 weeks of daptomycin  with a PICC line being placed today.  If home health antibiotics can be set up for today potential discharge today.  There is 200 cc in the wound VAC canister.  Patient will need to transition to the Prevena pump at discharge.

## 2024-06-28 NOTE — Progress Notes (Signed)
 Mobility Specialist Progress Note:    06/28/24 1000  Mobility  Activity Ambulated with assistance  Level of Assistance Standby assist, set-up cues, supervision of patient - no hands on  Assistive Device Front wheel walker  Distance Ambulated (ft) 250 ft  LLE Weight Bearing Per Provider Order WBAT  Activity Response Tolerated well  Mobility Referral Yes  Mobility visit 1 Mobility  Mobility Specialist Start Time (ACUTE ONLY) 0935  Mobility Specialist Stop Time (ACUTE ONLY) 0950  Mobility Specialist Time Calculation (min) (ACUTE ONLY) 15 min   Received pt in bed having no complaints and agreeable to mobility. Pt was asymptomatic throughout ambulation and returned to room w/o fault. Left in chair w/ call bell in reach and all needs met.   Thersia Minder Mobility Specialist  Please contact vis Secure Chat or  Rehab Office [phone]

## 2024-06-28 NOTE — Progress Notes (Signed)
 Physical Therapy Treatment Patient Details Name: [name] MRN: [id number] DOB: [birth year] Today's Date: 06/28/2024   History of Present Illness [name] is a 78 y.o. male who presented to [facility] hospital 06/23/2024  with a diagnosis of left hip wound infection with septic THA who failed conservative measures and elected for surgical management. Pt s/p I&D and wound vac application. PMHx: OA, DDD, left foot drop, neuropathy, and chronic left hip infection.    PT Comments  Pt has been mobilizing independently in room and ambulating with mobility team. Was able to ambulate safely in hallway with use of RW as well as practicing 6 steps with L rail as he has at home. Pt has met acute PT goals. Recommend outpt f/u when directed by physician. PT signing off at this time.      If plan is discharge home, recommend the following: Assistance with cooking/housework;Assist for transportation   Can travel by private vehicle        Equipment Recommendations  None recommended by PT    Recommendations for Other Services       Precautions / Restrictions Precautions Precautions: Fall Recall of Precautions/Restrictions: Intact Precaution/Restrictions Comments: no recent falls Restrictions Weight Bearing Restrictions Per Provider Order: No LLE Weight Bearing Per Provider Order: Weight bearing as tolerated     Mobility  Bed Mobility Overal bed mobility: Modified Independent             General bed mobility comments: pt getting in and out of bed without assist    Transfers Overall transfer level: Needs assistance Equipment used: Rolling walker (2 wheels) Transfers: Sit to/from Stand Sit to Stand: Modified independent (Device/Increase time)           General transfer comment: from bed and recliner    Ambulation/Gait Ambulation/Gait assistance: Modified independent (Device/Increase time) Gait Distance (Feet): 200 Feet Assistive device: Rolling walker (2 wheels) Gait  Pattern/deviations: Step-through pattern Gait velocity: decreased Gait velocity interpretation: >2.62 ft/sec, indicative of community ambulatory Pre-gait activities: standing hip extension x5 as pt tends to maintain hip flexion with gait General Gait Details: pt steady with use of RW to compensate for L foot drop   Stairs Stairs: Yes Stairs assistance: Modified independent (Device/Increase time) Stair Management: One rail Left, Step to pattern, Forwards Number of Stairs: 6 General stair comments: pt prefers to ascend with RLE but can also ascend with L. Steps down with R due to R knee OA. LLE remained stable during descent.   Wheelchair Mobility     Tilt Bed    Modified Rankin (Stroke Patients Only)       Balance Overall balance assessment: Needs assistance Sitting-balance support: No upper extremity supported, Feet supported Sitting balance-Leahy Scale: Normal     Standing balance support: No upper extremity supported, During functional activity Standing balance-Leahy Scale: Fair                              Hotel manager: No apparent difficulties  Cognition Arousal: Alert Behavior During Therapy: WFL for tasks assessed/performed   PT - Cognitive impairments: No apparent impairments                         Following commands: Intact      Cueing Cueing Techniques: Verbal cues  Exercises      General Comments        Pertinent Vitals/Pain Pain Assessment Pain Assessment: No/denies  pain    Home Living                          Prior Function            PT Goals (current goals can now be found in the care plan section) Acute Rehab PT Goals Patient Stated Goal: return home PT Goal Formulation: With patient Time For Goal Achievement: 07/08/24 Potential to Achieve Goals: Good Progress towards PT goals: Goals met/education completed, patient discharged from PT    Frequency    Min  2X/week      PT Plan      Co-evaluation              AM-PAC PT 6 Clicks Mobility   Outcome Measure  Help needed turning from your back to your side while in a flat bed without using bedrails?: None Help needed moving from lying on your back to sitting on the side of a flat bed without using bedrails?: None Help needed moving to and from a bed to a chair (including a wheelchair)?: None Help needed standing up from a chair using your arms (e.g., wheelchair or bedside chair)?: None Help needed to walk in hospital room?: None Help needed climbing 3-5 steps with a railing? : None 6 Click Score: 24    End of Session   Activity Tolerance: Patient tolerated treatment well Patient left: with call bell/phone within reach;in bed Nurse Communication: Mobility status PT Visit Diagnosis: Unsteadiness on feet (R26.81);Muscle weakness (generalized) (M62.81);Difficulty in walking, not elsewhere classified (R26.2)     Time: 8781-8766 PT Time Calculation (min) (ACUTE ONLY): 15 min  Charges:    $Gait Training: 8-22 mins PT General Charges $$ ACUTE PT VISIT: 1 Visit                     Richerd Lipoma, PT  Acute Rehab Services Secure chat preferred Office [phone]    Richerd CROME Arnett Galindez 06/28/2024, 1:43 PM

## 2024-06-28 NOTE — TOC Transition Note (Addendum)
 Transition of Care [facility]) - Discharge Note   Patient Details  Name: [name] MRN: [id number] Date of Birth: Dec 09, 1945  Transition of Care [facility]) CM/SW Contact:  Rosalva Jon Bloch, RN Phone Number: 06/28/2024, 3:53 PM   Clinical Narrative:    Patient will DC to: home Anticipated DC date: 06/28/2024 Family notified: yes Transport by: car  Per MD patient ready for DC today. RN, patient, patient's family, and [facility] notified of DC. Pt will require LT IV ABX. Pt agreeable to home infusion therapy. Pt active with [facility], HHRN. Referral made with [facility] and accepted. Teaching completed by Pam/ [facility] @ bedside.  Post hospital f/u noted on AVS.  Family to provide transportation to home.  RNCM will sign off for now as intervention is no longer needed. Please consult us  again if new needs arise.    Final next level of care: Home w Home Health Services Barriers to Discharge: No Barriers Identified   Patient Goals and CMS Choice     Choice offered to / list presented to : Patient      Discharge Placement                       Discharge Plan and Services Additional resources added to the After Visit Summary for                  DME Arranged: Other see comment (IV ABX therapy) DME Agency: Other - Comment ([facility]) Date DME Agency Contacted: 06/28/24 Time DME Agency Contacted: [phone] Representative spoke with at DME Agency: Pam HH Arranged: RN HH Agency: [facility] Date [facility] Agency Contacted: 06/28/24 Time HH Agency Contacted: [phone] Representative spoke with at [facility] Agency: Darleene  Social Drivers of Health (SDOH) Interventions SDOH Screenings   Food Insecurity: No Food Insecurity (06/23/2024)  Housing: Low Risk  (06/23/2024)  Transportation Needs: No Transportation Needs (06/23/2024)  Utilities: Not At Risk (06/23/2024)  Alcohol Screen: Low Risk  (03/31/2024)  Depression (PHQ2-9): Low Risk   (05/06/2024)  Financial Resource Strain: Low Risk  (03/31/2024)  Physical Activity: Insufficiently Active (03/31/2024)  Social Connections: Socially Integrated (06/23/2024)  Stress: No Stress Concern Present (03/31/2024)  Tobacco Use: Low Risk  (06/23/2024)  Health Literacy: Adequate Health Literacy (03/31/2024)     Readmission Risk Interventions    12/15/2023    9:43 AM 09/23/2023   11:03 AM 01/06/2023   12:56 PM  Readmission Risk Prevention Plan  Post Dischage Appt Complete Complete Complete  Medication Screening Complete Complete Complete  Transportation Screening Complete Complete Complete

## 2024-06-28 NOTE — Progress Notes (Signed)
 PHARMACY CONSULT NOTE FOR:  OUTPATIENT  PARENTERAL ANTIBIOTIC THERAPY (OPAT)  Indication: Left chronic hip PJI  Regimen: daptomycin  IV 700 mg q24h  End date: 08/04/24  IV antibiotic discharge orders are pended. To discharging provider:  please sign these orders via discharge navigator,  Select New Orders & click on the button choice - Manage This Unsigned Work.     Thank you for allowing pharmacy to be a part of this patient's care.  Feliciano Close, PharmD PGY2 Infectious Diseases Pharmacy Resident  06/28/2024 11:04 AM

## 2024-06-28 NOTE — Discharge Instructions (Signed)
 Keep Vac clean and dry.  WBAT.

## 2024-06-28 NOTE — Progress Notes (Signed)
 Reviewed AVS, patient expressed understanding of medications, MD follow up reviewed.    See LDA for information on wounds at discharge. Patient states all belongings brought to the hospital at time of admission are accounted for and packed to take home.  Picked up medications from [facility] pharmacy. This nurse transported patient to Discharge lounge to wait for transportation home.

## 2024-06-28 NOTE — Progress Notes (Signed)
 Peripherally Inserted Central Catheter Placement  The IV Nurse has discussed with the patient and/or persons authorized to consent for the patient, the purpose of this procedure and the potential benefits and risks involved with this procedure.  The benefits include less needle sticks, lab draws from the catheter, and the patient may be discharged home with the catheter. Risks include, but not limited to, infection, bleeding, blood clot (thrombus formation), and puncture of an artery; nerve damage and irregular heartbeat and possibility to perform a PICC exchange if needed/ordered by physician.  Alternatives to this procedure were also discussed.  Bard Power PICC patient education guide, fact sheet on infection prevention and patient information card has been provided to patient /or left at bedside.    PICC Placement Documentation  PICC Single Lumen 06/28/24 Right Brachial 39 cm 0 cm (Active)  Indication for Insertion or Continuance of Line Prolonged intravenous therapies 06/28/24 1641  Exposed Catheter (cm) 0 cm 06/28/24 1641  Site Assessment Clean, Dry, Intact 06/28/24 1641  Line Status Flushed;Blood return noted;Saline locked 06/28/24 1641  Dressing Type Transparent 06/28/24 1641  Dressing Status Antimicrobial disc/dressing in place 06/28/24 1641  Line Care Connections checked and tightened 06/28/24 1641  Line Adjustment (NICU/IV Team Only) No 06/28/24 1641  Dressing Intervention New dressing 06/28/24 1641  Dressing Change Due 07/05/24 06/28/24 1641       [name] 06/28/2024, 4:43 PM

## 2024-06-29 ENCOUNTER — Telehealth: Payer: Self-pay | Admitting: *Deleted

## 2024-06-29 DIAGNOSIS — T8459XA Infection and inflammatory reaction due to other internal joint prosthesis, initial encounter: Secondary | ICD-10-CM | POA: Diagnosis not present

## 2024-06-29 DIAGNOSIS — Z96649 Presence of unspecified artificial hip joint: Secondary | ICD-10-CM | POA: Diagnosis not present

## 2024-06-29 DIAGNOSIS — T8189XA Other complications of procedures, not elsewhere classified, initial encounter: Secondary | ICD-10-CM | POA: Diagnosis not present

## 2024-06-29 NOTE — Transitions of Care (Post Inpatient/ED Visit) (Signed)
 06/29/2024  Name: [name] MRN: [id number] DOB: November 22, 1945  Today's TOC FU Call Status: Today's TOC FU Call Status:: Successful TOC FU Call Completed TOC FU Call Complete Date: 06/29/24 Patient's Name and Date of Birth confirmed.  Transition Care Management Follow-up Telephone Call Date of Discharge: 06/28/24 Discharge Facility: [name]) Type of Discharge: Inpatient Admission Primary Inpatient Discharge Diagnosis:: Prosthetic hip infection How have you been since you were released from the hospital?: Better Any questions or concerns?: No  Items Reviewed: Did you receive and understand the discharge instructions provided?: Yes Medications obtained,verified, and reconciled?: Yes (Medications Reviewed) Any new allergies since your discharge?: No Dietary orders reviewed?: No Do you have support at home?: Yes People in Home [RPT]: spouse Name of Support/Comfort Primary Source: [name]  Medications Reviewed Today: Medications Reviewed Today     Reviewed by Kennieth Cathlean DEL, RN (Case Manager) on 06/29/24 at 1421  Med List Status: <None>   Medication Order Taking? Sig Documenting Provider Last Dose Status Informant  acetaminophen  (TYLENOL ) 500 MG tablet [id number] Yes Take 1,000 mg by mouth 2 (two) times daily as needed for moderate pain or headache.  [provider]  Active Self  aspirin  EC 81 MG tablet [id number] Yes Take 81 mg by mouth daily. [provider]  Active Self  atorvastatin  (LIPITOR) 20 MG tablet [id number] Yes Take 1 tablet (20 mg total) by mouth daily. McGowen, Philip H, MD  Active Self  Calcium  Carb-Cholecalciferol  (CALCIUM  + D3 PO) [id number] Yes Take 600 mg by mouth daily. [provider]  Active Self  daptomycin  (CUBICIN ) IVPB [id number] Yes Inject 700 mg into the vein daily. Indication:  Chronic left hip PJI First Dose: Yes Last Day of Therapy:  08/04/24 Labs - Once weekly:  CBC/D, BMP, and CPK Labs - Once weekly: ESR and  CRP Method of administration: IV Push Method of administration may be changed at the discretion of home infusion pharmacist based upon assessment of the patient and/or caregiver's ability to self-administer the medication ordered. Gerome Herring M, PA-C  Active   methocarbamol  (ROBAXIN ) 500 MG tablet [id number] Yes Take 1 tablet (500 mg total) by mouth every 6 (six) hours as needed for muscle spasms. Gerome Herring M, PA-C  Active   oxyCODONE  (OXY IR/ROXICODONE ) 5 MG immediate release tablet [id number] Yes Take 1 tablet (5 mg total) by mouth every 6 (six) hours as needed for moderate pain (pain score 4-6). Gerome Herring M, PA-C  Active   solifenacin (VESICARE) 10 MG tablet [id number] Yes Take 10 mg by mouth daily. [provider]  Active Self            Home Care and Equipment/Supplies: Were Home Health Services Ordered?: Yes Name of Home Health Agency:: [facility] Has Agency set up a time to come to your home?: Yes First Home Health Visit Date: 06/29/24 Any new equipment or medical supplies ordered?: Yes Name of Medical supply agency?: [facility] Were you able to get the equipment/medical supplies?: Yes Do you have any questions related to the use of the equipment/supplies?: No  Functional Questionnaire: Do you need assistance with bathing/showering or dressing?: Yes Do you need assistance with meal preparation?: Yes Do you need assistance with eating?: No Do you have difficulty maintaining continence: No Do you need assistance with getting out of bed/getting out of a chair/moving?: Yes (using a cane) Do you have difficulty managing or taking your medications?: No  Follow up appointments reviewed: PCP Follow-up appointment confirmed?: No MD Provider Line Number:[phone]  Given: Yes (patient will call for follow up) Specialist Hospital Follow-up appointment confirmed?: Yes Date of Specialist follow-up appointment?: 07/05/24 Follow-Up Specialty Provider:: [id number] [name]  ortho / [id number] [name]  infectious disease Do you need transportation to your follow-up appointment?: No Do you understand care options if your condition(s) worsen?: Yes-patient verbalized understanding  SDOH Interventions Today    Flowsheet Row Most Recent Value  SDOH Interventions   Food Insecurity Interventions Intervention Not Indicated  Housing Interventions Intervention Not Indicated  Transportation Interventions Intervention Not Indicated, Patient Resources (Friends/Family)  Utilities Interventions Intervention Not Indicated   Discussed and offered 30 day TOC program.  Patient   declined.  The patient has been provided with contact information for the care management team and has been advised to call with any health -related questions or concerns.  The patient verbalized understanding with current plan of care.  The patient is directed to their insurance card regarding availability of benefits coverage  Cathlean Headland BSN RN [facility] Care Management Coordinator [email or web address] Direct Dial: [phone]  Fax: [phone] Website: [email or web address]

## 2024-07-04 DIAGNOSIS — Z96649 Presence of unspecified artificial hip joint: Secondary | ICD-10-CM | POA: Diagnosis not present

## 2024-07-04 DIAGNOSIS — T8459XA Infection and inflammatory reaction due to other internal joint prosthesis, initial encounter: Secondary | ICD-10-CM | POA: Diagnosis not present

## 2024-07-04 LAB — MINIMUM INHIBITORY CONC. (1 DRUG)

## 2024-07-04 LAB — MIC RESULT

## 2024-07-04 LAB — AEROBIC/ANAEROBIC CULTURE W GRAM STAIN (SURGICAL/DEEP WOUND)

## 2024-07-05 ENCOUNTER — Encounter: Payer: Self-pay | Admitting: Physician Assistant

## 2024-07-05 ENCOUNTER — Ambulatory Visit (INDEPENDENT_AMBULATORY_CARE_PROVIDER_SITE_OTHER): Admitting: Physician Assistant

## 2024-07-05 DIAGNOSIS — Z9889 Other specified postprocedural states: Secondary | ICD-10-CM

## 2024-07-05 DIAGNOSIS — M009 Pyogenic arthritis, unspecified: Secondary | ICD-10-CM

## 2024-07-05 NOTE — Progress Notes (Signed)
 Office Visit Note   Patient: [name]           Date of Birth: 1945-11-03           MRN: [id number] Visit Date: 07/05/2024              Requested by: Candise Aleene DEL, MD [address] [place],  [place] [address] PCP: Candise Aleene DEL, MD  Chief Complaint  Patient presents with   Left Hip - Routine Post Op    06/23/2024 I&D left hip      HPI: [name] is a  78 y.o. male with a diagnosis of Wound Infection Left Hip who failed conservative measures and elected for surgical management.  He underwent Excisional debridement soft tissue extra-articular leg and irrigation and debridement intra-articular on 06/23/24.  He was discharged on cultures showed Prior microbe mssa; this debridement both mrse and staph aureus.  He was discharged with with a PICC line 6 weeks dapto then transition to oral abx per ID.   He denies fever and chills.  He states the wound vac got clogged and he had to milk the line and re seal the vac.       Assessment & Plan: Visit Diagnoses: No diagnosis found.  Plan: Karel with soap and water .  Dry dressing over distal open incision area PRN until closed and no drainage.  Keep PICC line clean and dry.  F/U with ID.    Follow-Up Instructions: Return in about 1 week (around 07/12/2024) for Suture removal .   Ortho Exam  Patient is alert, oriented, no adenopathy, well-dressed, normal affect, normal respiratory effort. Left hip incision healing well.  No active or expressible drainage.  Small distal incision opening 0.5 cm x 0.5 cm.  No fluctuance.  No cellulitis.      Imaging: No results found.   Labs: Lab Results  Component Value Date   HGBA1C 6.4 05/06/2024   HGBA1C 5.7 05/06/2023   HGBA1C 5.4 05/27/2013   ESRSEDRATE 11 02/10/2024   ESRSEDRATE 5 01/04/2023   ESRSEDRATE 2 07/27/2020   CRP 10.8 (H) 02/10/2024   CRP 37.4 (H) 12/04/2023   CRP 22.1 (H) 10/14/2023   LABURIC 7.8 05/27/2013   REPTSTATUS 07/04/2024 FINAL 06/23/2024    GRAMSTAIN  06/23/2024    MODERATE WBC PRESENT, PREDOMINANTLY PMN NO ORGANISMS SEEN    CULT  06/23/2024    RARE STAPHYLOCOCCUS AUREUS NO ANAEROBES ISOLATED Sent to [facility] for further susceptibility testing. SEE SEPARATE REPORT Performed at [facility], [address] [place], [place] [address]    [facility] STAPHYLOCOCCUS AUREUS 06/23/2024     Lab Results  Component Value Date   ALBUMIN  3.3 (L) 06/23/2024   ALBUMIN  3.9 05/06/2024   ALBUMIN  3.4 (L) 08/07/2023    No results found for: MG No results found for: VD25OH  No results found for: PREALBUMIN    Latest Ref Rng & Units 06/23/2024    8:20 AM 05/06/2024    8:15 AM 12/14/2023    5:09 AM  CBC EXTENDED  WBC 4.0 - 10.5 K/uL 5.2  5.7  5.7   RBC 4.22 - 5.81 MIL/uL 5.62  5.50  4.05   Hemoglobin 13.0 - 17.0 g/dL 85.6  86.9  89.7   HCT 39.0 - 52.0 % 47.2  41.5  34.1   Platelets 150 - 400 K/uL 222  251.0  216   NEUT# 1.7 - 7.7 K/uL 2.3  2.7    Lymph# 0.7 -  4.0 K/uL 2.1  2.1       There is no height or weight on file to calculate BMI.  Orders:  No orders of the defined types were placed in this encounter.  No orders of the defined types were placed in this encounter.    Procedures: No procedures performed  Clinical Data: No additional findings.  ROS:  All other systems negative, except as noted in the HPI. Review of Systems  Objective: Vital Signs: There were no vitals taken for this visit.  Specialty Comments:  No specialty comments available.  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   Pain in left hip 09/01/2017   History of revision of total replacement of left hip joint 09/01/2017   Encounter for surveillance of recalled total hip arthroplasty hardware 06/07/2016   Status post revision of total hip replacement 06/07/2016   Spinal stenosis, lumbar region, 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  Dislocated posteriorly 2015, ? partial RC tear, has f/u planned with Dr. Addie--? surgery? possible plan for 2016.   Blood donor, whole blood    Has donated approx 30 gallons in his lifetime   Chronic left hip pain    Severe DJD.  THA 2008.  Left acetabular revision in 2017 AND 2019.   Chronic pain syndrome    Candidate for spinal cord stimulator (03/2017)   Chronic renal insufficiency, stage II (mild)    Chronic venous insufficiency    +compression hose   DDD (degenerative disc disease), lumbar 2017   Dr. Alvah ortho.  He is now wearing a new leg brace.  Has had ESI and will get another.   Diverticulosis    +redundant colon   DVT (deep venous thrombosis) (HCC) 06/14/2016   provoked, bilateral LL's; in post-op setting s/p hip surgery.  Xarelto  x 68mo at full dosing, then 6 more months at 1/2 dosing, then stopped xarelto .     Foot drop, left    s/p hip fracture 1969   History of blood transfusion    Hx of adenomatous colonic polyps    polypectomy 2007; 2012; 2015; 05/2019; recall 4yrs   Hypercholesterolemia  02/2017   Recommended statin 02/10/17 but pt declined, wanted to do TLC trial first. Rec'd statin 04/2020   Neuropathy    Osteoarthritis    knees and hips primarily   Phlebitis    Post laminectomy syndrome    improved with spinal cord stim-->stim to be removed summer/fall 2019.   Prediabetes    04/2023 fasting 111, a1c 5.7%.  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 PSA 0.16 Feb 2020 urol f/u. [id number] Aug 2021 urol. <0.015 02/2021.   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; 02/2020   MSSA; admitted for I&D 02/11/20->plan for longterm IV abx after   Urge incontinence    vesicare helpful    Family History  Problem Relation Age of Onset   Lung cancer Mother    Heart disease Father    Colon cancer Neg Hx    Esophageal cancer Neg Hx    Rectal cancer Neg Hx    Stomach cancer Neg Hx    Colon polyps Neg Hx     Past Surgical History:  Procedure Laterality Date   ANTERIOR HIP REVISION Left 06/07/2016   Procedure: LEFT ANTERIOR HIP ACETABULAR REVISION;  Surgeon: [name], MD;  Location: [facility];  Service: Orthopedics;  Laterality: Left;   ANTERIOR HIP REVISION Left 10/09/2017   Procedure: Left hip acetabular revision;  Surgeon: Poli Lonni CINDERELLA, MD;  Location: [facility];  Service: Orthopedics;  Laterality: Left;   bilateral carpal tunnel release  2003   CARPAL TUNNEL RELEASE Bilateral    COLONOSCOPY  2007;2012;2015;05/2019   04/12/2014 Tubular adenoma x 1; 05/2019 adenomatous polyp; recall 5 yrs.   FRACTURE SURGERY Left 1969   hip, traction hospitalized for 6 weeks   INCISION AND DRAINAGE Left 08/15/2023   Procedure: INCISION AND DRAINAGE OF SEROMA LEFT THIGH;  Surgeon: Poli Lonni CINDERELLA, MD;  Location: [facility];  Service: Orthopedics;  Laterality: Left;   INCISION AND DRAINAGE Left 09/19/2023   Procedure: INCISION AND DRAINAGE  LEFT THIGH WITH WOUND VAC PLACEMENT;  Surgeon: Poli Lonni CINDERELLA, MD;  Location: [facility];  Service: Orthopedics;  Laterality: Left;   INCISION AND DRAINAGE HIP Left 02/11/2020   Procedure: IRRIGATION AND DEBRIDEMENT LEFT HIP WITH ANTIBIOTIC SPACER PLACEMENT;  Surgeon: Poli Lonni CINDERELLA, MD;  Location: [facility];  Service: Orthopedics;  Laterality: Left;   INCISION AND DRAINAGE HIP Left 01/03/2023   Procedure: IRRIGATION AND DEBRIDEMENT LEFT HIP;  Surgeon: Poli Lonni CINDERELLA, MD;  Location: [facility];  Service: Orthopedics;  Laterality: Left;   INCISION AND DRAINAGE HIP Left 12/12/2023   Procedure: IRRIGATION AND DEBRIDEMENT HIP;  Surgeon: Poli Lonni CINDERELLA, MD;  Location: [facility];  Service: Orthopedics;  Laterality: Left;  Irrigation and Debridement left hip   INCISION AND DRAINAGE HIP Left 06/23/2024   Procedure: IRRIGATION AND DEBRIDEMENT HIP;  Surgeon: Harden Jerona GAILS, MD;  Location: [facility];  Service: Orthopedics;  Laterality: Left;  LEFT HIP DEBRIDEMENT   LUMBAR LAMINECTOMY/DECOMPRESSION MICRODISCECTOMY Left 03/28/2016   Patient denies: Procedure: Left Lumbar three-four Laminoforaminotomy;  Surgeon: Fairy Levels, MD;  Location: [facility];  Service: Neurosurgery;  Laterality: Left;  left   POLYPECTOMY     RADIOACTIVE SEED IMPLANT  2012   ROTATOR CUFF REPAIR  2006   right   SHOULDER ARTHROSCOPY Right    SPINAL CORD STIMULATOR IMPLANT  06/2017   removed a year after was putted in.   SPINAL CORD STIMULATOR REMOVAL     TOTAL HIP ARTHROPLASTY  2008   left (cobalt chrome)   VASECTOMY     Social History   Occupational History   Not on file  Tobacco Use   Smoking status: Never   Smokeless tobacco: Never  Vaping Use   Vaping status: Never Used  Substance and Sexual Activity   Alcohol use: No  Drug use: No   Sexual activity: Not Currently

## 2024-07-06 DIAGNOSIS — T8452XA Infection and inflammatory reaction due to internal left hip prosthesis, initial encounter: Secondary | ICD-10-CM | POA: Diagnosis not present

## 2024-07-06 NOTE — Discharge Summary (Signed)
 Physician Discharge Summary  Patient ID: [name] MRN: [id number] DOB/AGE: 1946-07-21 78 y.o.  Admit date: 06/23/2024 Discharge date: 07/06/2024  Admission Diagnoses:  Principal Problem:   Prosthetic hip infection Active Problems:   Infection of prosthetic total hip joint   Septic hip [facility])   Discharge Diagnoses:  Same  Past Medical History:  Diagnosis Date   Anterior dislocation of right shoulder 09/2013   Reduced in ED under sedation.  Dislocated posteriorly 2015, ? partial RC tear, has f/u planned with Dr. Addie--? surgery? possible plan for 2016.   Blood donor, whole blood    Has donated approx 30 gallons in his lifetime   Chronic left hip pain    Severe DJD.  THA 2008.  Left acetabular revision in 2017 AND 2019.   Chronic pain syndrome    Candidate for spinal cord stimulator (03/2017)   Chronic renal insufficiency, stage II (mild)    Chronic venous insufficiency    +compression hose   DDD (degenerative disc disease), lumbar 2017   Dr. Alvah ortho.  He is now wearing a new leg brace.  Has had ESI and will get another.   Diverticulosis    +redundant colon   DVT (deep venous thrombosis) (HCC) 06/14/2016   provoked, bilateral LL's; in post-op setting s/p hip surgery.  Xarelto  x 107mo at full dosing, then 6 more months at 1/2 dosing, then stopped xarelto .     Foot drop, left    s/p hip fracture 1969   History of blood transfusion    Hx of adenomatous colonic polyps    polypectomy 2007; 2012; 2015; 05/2019; recall 37yrs   Hypercholesterolemia 02/2017   Recommended statin 02/10/17 but pt declined, wanted to do TLC trial first. Rec'd statin 04/2020   Neuropathy    Osteoarthritis    knees and hips primarily   Phlebitis    Post laminectomy syndrome    improved with spinal cord stim-->stim to be removed summer/fall 2019.   Prediabetes    04/2023 fasting 111, a1c 5.7%.  Then a1c 6.4% July 2025   Prostate cancer [facility]) 2012; 2020   Acinar cell carcinoma of the  prostate 2011--ext beam radiation + radiation seed implants. Recurrence 2020->started eligard  and apalutamide . PSA 0.16 Feb 2020 urol f/u. [id number] Aug 2021 urol. <0.015 02/2021.   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 male who was admitted 06/23/2024 with a chief complaint of No chief complaint on file. , and found to have a diagnosis of Prosthetic hip infection.  They were brought to the operating room on 06/23/2024 and underwent the above named procedures.    They were given perioperative antibiotics:  Anti-infectives (From admission, onward)    Start     Dose/Rate Route Frequency Ordered Stop   06/29/24 0000  daptomycin  (CUBICIN ) 700-0.9 MG/100ML-% SOLN  Status:  Discontinued        700 mg Intravenous Daily 06/28/24 1711 06/28/24    06/28/24 1400  DAPTOmycin  (CUBICIN ) IVPB 700 mg/100mL premix  Status:  Discontinued        8 mg/kg  91.5 kg (Adjusted) 200 mL/hr over 30 Minutes Intravenous Daily 06/28/24 0826 06/28/24 2300   06/28/24 0000  daptomycin  (CUBICIN ) IVPB  700 mg Intravenous Every 24 hours 06/28/24 1323 08/04/24 2359   06/27/24 1215  DAPTOmycin  (CUBICIN ) IVPB 700 mg/100mL premix  Status:  Discontinued        6 mg/kg  108.9 kg 200 mL/hr over 30 Minutes Intravenous Daily 06/27/24 1126 06/28/24 0826   06/24/24 1600  vancomycin  (VANCOREADY) IVPB 1250 mg/250 mL  Status:  Discontinued        1,250 mg 166.7 mL/hr over 90 Minutes Intravenous Every 24 hours 06/23/24 1518 06/27/24 1125   06/23/24 2200  cefadroxil  (DURICEF) capsule 1,000 mg  Status:  Discontinued        1,000 mg Oral 2 times daily 06/23/24 1456 06/23/24 1546   06/23/24 1630  vancomycin  (VANCOREADY) IVPB 2000 mg/400 mL        2,000 mg 200  mL/hr over 120 Minutes Intravenous  Once 06/23/24 1518 06/24/24 1840   06/23/24 1615  piperacillin -tazobactam (ZOSYN ) IVPB 3.375 g  Status:  Discontinued        3.375 g 12.5 mL/hr over 240 Minutes Intravenous Every 8 hours 06/23/24 1518 06/27/24 1125   06/23/24 1044  tobramycin  (NEBCIN ) powder  Status:  Discontinued          As needed 06/23/24 1044 06/23/24 1113   06/23/24 1044  vancomycin  (VANCOCIN ) powder  Status:  Discontinued          As needed 06/23/24 1045 06/23/24 1113   06/23/24 0800  ceFAZolin  (ANCEF ) IVPB 2g/100 mL premix        2 g 200 mL/hr over 30 Minutes Intravenous On call to O.R. 06/23/24 0757 06/23/24 1040     .  They were given compression stockings, early ambulation, and chemoprophylaxis for DVT prophylaxis.  They benefited maximally from their hospital stay and there were no complications.    Recent vital signs:  Vitals:   06/28/24 0430 06/28/24 0730  BP: 122/64 115/66  Pulse: (!) 55 (!) 56  Resp: 15 17  Temp: 98.2 F (36.8 C) 97.8 F (36.6 C)  SpO2: 96% 97%    Recent laboratory studies:  Results for orders placed or performed during the hospital encounter of 06/23/24  CBC WITH DIFFERENTIAL   Collection Time: 06/23/24  8:20 AM  Result Value Ref Range   WBC 5.2 4.0 - 10.5 K/uL   RBC 5.62 4.22 - 5.81 MIL/uL   Hemoglobin 14.3 13.0 - 17.0 g/dL   HCT 52.7 60.9 - 47.9 %   MCV 84.0 80.0 - 100.0 fL   MCH 25.4 (L) 26.0 - 34.0 pg   MCHC 30.3 30.0 - 36.0 g/dL   RDW 80.5 (H) 88.4 - 84.4 %   Platelets 222 150 - 400 K/uL   nRBC 0.0 0.0 - 0.2 %   Neutrophils Relative % 45 %   Neutro Abs 2.3 1.7 - 7.7 K/uL   Lymphocytes Relative 40 %   Lymphs Abs 2.1 0.7 - 4.0 K/uL   Monocytes Relative 11 %   Monocytes Absolute 0.6 0.1 - 1.0 K/uL   Eosinophils Relative 3 %   Eosinophils Absolute 0.2 0.0 - 0.5 K/uL   Basophils Relative 1 %   Basophils Absolute 0.0 0.0 - 0.1 K/uL   Immature Granulocytes 0 %   Abs Immature Granulocytes 0.00 0.00 - 0.07 K/uL  Comprehensive  metabolic panel   Collection Time: 06/23/24  8:20 AM  Result Value Ref Range   Sodium 137 135 - 145 mmol/L   Potassium 4.9 3.5 - 5.1 mmol/L   Chloride 108 98 - 111 mmol/L  CO2 19 (L) 22 - 32 mmol/L   Glucose, Bld 96 70 - 99 mg/dL   BUN 22 8 - 23 mg/dL   Creatinine, Ser 8.57 (H) 0.61 - 1.24 mg/dL   Calcium  9.5 8.9 - 10.3 mg/dL   Total Protein 6.5 6.5 - 8.1 g/dL   Albumin  3.3 (L) 3.5 - 5.0 g/dL   AST 22 15 - 41 U/L   ALT 14 0 - 44 U/L   Alkaline Phosphatase 78 38 - 126 U/L   Total Bilirubin 0.8 0.0 - 1.2 mg/dL   GFR, Estimated 50 (L) >60 mL/min   Anion gap 10 5 - 15  Aerobic/Anaerobic Culture w Gram Stain (surgical/deep wound)   Collection Time: 06/23/24 10:02 AM   Specimen: PATH Soft tissue  Result Value Ref Range   Specimen Description TISSUE    Special Requests LEFT HIP TISSUE    Gram Stain      RARE WBC PRESENT,BOTH PMN AND MONONUCLEAR NO ORGANISMS SEEN    Culture      RARE STAPHYLOCOCCUS EPIDERMIDIS NO ANAEROBES ISOLATED Performed at [facility], [address], [place], [place] [address]    Report Status 06/28/2024 FINAL    Organism ID, Bacteria STAPHYLOCOCCUS EPIDERMIDIS       Susceptibility   Staphylococcus epidermidis - MIC*    CIPROFLOXACIN Value in next row Sensitive      SENSITIVEMIC <=0.5    ERYTHROMYCIN Value in next row Sensitive      SENSITIVEMIC <=0.25    GENTAMICIN Value in next row Sensitive      SENSITIVEMIC <= 0.5    OXACILLIN Value in next row Resistant      RESISTANTMIC >= 4    TETRACYCLINE Value in next row Sensitive      SENSITIVEMIC <=1    VANCOMYCIN  Value in next row Sensitive      SENSITIVEMIC 1    TRIMETH /SULFA  Value in next row Sensitive      SENSITIVEMIC <=10    CLINDAMYCIN Value in next row Sensitive      SENSITIVEMIC <=0.25    RIFAMPIN  Value in next row Sensitive      SENSITIVEMIC <=0.5    Inducible Clindamycin Value in next row Sensitive      SENSITIVEMIC <=0.5    * RARE STAPHYLOCOCCUS EPIDERMIDIS  MIC (1 Drug)-Path  Soft Tissue - intra articular fluid; 06/23/2024; PATH Soft tissue; Rare Staphylococcus Aureus; Daptomycin    Collection Time: 06/23/24 10:02 AM   Specimen: PATH Soft tissue  Result Value Ref Range   Min Inhibitory Conc (1 Drug) Final report    Source LEFT HIP TISSUE ABSC   MIC Result   Collection Time: 06/23/24 10:02 AM  Result Value Ref Range   Result 1 (MIC) Staphylococcus aureus   Aerobic/Anaerobic Culture w Gram Stain (surgical/deep wound)   Collection Time: 06/23/24 10:58 AM   Specimen: PATH Soft tissue  Result Value Ref Range   Specimen Description FLUID    Special Requests INTRA ARTICULAR FLUID    Gram Stain      MODERATE WBC PRESENT, PREDOMINANTLY PMN NO ORGANISMS SEEN    Culture      RARE STAPHYLOCOCCUS AUREUS NO ANAEROBES ISOLATED Sent to [facility] for further susceptibility testing. SEE SEPARATE REPORT Performed at [facility], [address] [place], [place] [address]    Report Status 07/04/2024 FINAL    Organism ID, Bacteria STAPHYLOCOCCUS AUREUS       Susceptibility   Staphylococcus aureus - MIC*    CIPROFLOXACIN <=0.5  SENSITIVE Sensitive     ERYTHROMYCIN 0.5 SENSITIVE Sensitive     GENTAMICIN <=0.5 SENSITIVE Sensitive     OXACILLIN 2 SENSITIVE Sensitive     TETRACYCLINE <=1 SENSITIVE Sensitive     VANCOMYCIN  1 SENSITIVE Sensitive     TRIMETH /SULFA  <=10 SENSITIVE Sensitive     CLINDAMYCIN <=0.25 SENSITIVE Sensitive     RIFAMPIN  <=0.5 SENSITIVE Sensitive     Inducible Clindamycin NEGATIVE Sensitive     LINEZOLID 2 SENSITIVE Sensitive     * RARE STAPHYLOCOCCUS AUREUS  Basic metabolic panel with GFR   Collection Time: 06/25/24  5:16 AM  Result Value Ref Range   Sodium 135 135 - 145 mmol/L   Potassium 4.3 3.5 - 5.1 mmol/L   Chloride 107 98 - 111 mmol/L   CO2 23 22 - 32 mmol/L   Glucose, Bld 100 (H) 70 - 99 mg/dL   BUN 27 (H) 8 - 23 mg/dL   Creatinine, Ser 8.70 (H) 0.61 - 1.24 mg/dL   Calcium  9.4 8.9 - 10.3 mg/dL   GFR, Estimated 56 (L) >60 mL/min    Anion gap 5 5 - 15  CK   Collection Time: 06/27/24  3:15 PM  Result Value Ref Range   Total CK 31 (L) 49 - 397 U/L  Basic metabolic panel   Collection Time: 06/28/24  7:05 AM  Result Value Ref Range   Sodium 136 135 - 145 mmol/L   Potassium 4.3 3.5 - 5.1 mmol/L   Chloride 105 98 - 111 mmol/L   CO2 23 22 - 32 mmol/L   Glucose, Bld 114 (H) 70 - 99 mg/dL   BUN 22 8 - 23 mg/dL   Creatinine, Ser 8.71 (H) 0.61 - 1.24 mg/dL   Calcium  9.8 8.9 - 10.3 mg/dL   GFR, Estimated 57 (L) >60 mL/min   Anion gap 8 5 - 15    Discharge Medications:   Allergies as of 06/28/2024       Reactions   Adhesive [tape] Rash   Paper tape is ok        Medication List     STOP taking these medications    cefadroxil  500 MG capsule Commonly known as: DURICEF       TAKE these medications    acetaminophen  500 MG tablet Commonly known as: TYLENOL  Take 1,000 mg by mouth 2 (two) times daily as needed for moderate pain or headache.   aspirin  EC 81 MG tablet Take 81 mg by mouth daily.   atorvastatin  20 MG tablet Commonly known as: LIPITOR Take 1 tablet (20 mg total) by mouth daily.   CALCIUM  + D3 PO Take 600 mg by mouth daily.   daptomycin  IVPB Commonly known as: CUBICIN  Inject 700 mg into the vein daily. Indication:  Chronic left hip PJI First Dose: Yes Last Day of Therapy:  08/04/24 Labs - Once weekly:  CBC/D, BMP, and CPK Labs - Once weekly: ESR and CRP Method of administration: IV Push Method of administration may be changed at the discretion of home infusion pharmacist based upon assessment of the patient and/or caregiver's ability to self-administer the medication ordered.   methocarbamol  500 MG tablet Commonly known as: ROBAXIN  Take 1 tablet (500 mg total) by mouth every 6 (six) hours as needed for muscle spasms.   oxyCODONE  5 MG immediate release tablet Commonly known as: Oxy IR/ROXICODONE  Take 1 tablet (5 mg total) by mouth every 6 (six) hours as needed for moderate pain (pain  score 4-6).   solifenacin  10 MG tablet Commonly known as: VESICARE Take 10 mg by mouth daily.               Discharge Care Instructions  (From admission, onward)           Start     Ordered   06/28/24 0000  Change dressing on IV access line weekly and PRN  (Home infusion instructions - [facility] )        06/28/24 1323            Diagnostic Studies: US  EKG SITE RITE Result Date: 06/28/2024 If Site Rite image not attached, placement could not be confirmed due to current cardiac rhythm.   Disposition: Discharge disposition: 01-Home or Self Care       Discharge Instructions     [facility] pharmacist to adjust dose for Vancomycin , Aminoglycosides and other anti-infective therapies as requested by physician.   Complete by: As directed    [facility] to provide Cath Flo 2mg    Complete by: As directed    Administer for PICC line occlusion and as ordered by physician for other access device issues.   Anaphylaxis Kit: Provided to treat any anaphylactic reaction to the medication being provided to the patient if First Dose or when requested by physician   Complete by: As directed    Epinephrine  1mg /ml vial / amp: Administer 0.3mg  (0.55ml) subcutaneously once for moderate to severe anaphylaxis, nurse to call physician and pharmacy when reaction occurs and call 911 if needed for immediate care   Diphenhydramine  50mg /ml IV vial: Administer 25-50mg  IV/IM PRN for first dose reaction, rash, itching, mild reaction, nurse to call physician and pharmacy when reaction occurs   Sodium Chloride  0.9% NS 500ml IV: Administer if needed for hypovolemic blood pressure drop or as ordered by physician after call to physician with anaphylactic reaction   Change dressing on IV access line weekly and PRN   Complete by: As directed    Flush IV access with Sodium Chloride  0.9% and Heparin  10 units/ml or 100 units/ml   Complete by: As directed    Home infusion  instructions - [facility]   Complete by: As directed    Instructions: Flush IV access with Sodium Chloride  0.9% and Heparin  10units/ml or 100units/ml   Change dressing on IV access line: Weekly and PRN   Instructions Cath Flo 2mg : Administer for PICC Line occlusion and as ordered by physician for other access device   [facility] pharmacist to adjust dose for: Vancomycin , Aminoglycosides and other anti-infective therapies as requested by physician   Method of administration may be changed at the discretion of home infusion pharmacist based upon assessment of the patient and/or caregiver's ability to self-administer the medication ordered   Complete by: As directed         Follow-up Information     Harden Jerona GAILS, MD Follow up in 1 week(s).   Specialty: Orthopedic Surgery Contact information: [address] [place]  [place] [address] [phone]         Overton Constance DASEN, MD Follow up.   Specialty: Infectious Diseases Why: 07/22/24 @ 8:40 am with Dr. Overton. Please call to reschedule if you are not able to make this appointment. Contact information: [address] [place] [address] [place] [address] [phone]                  Signed: Maurilio Deland Collet 07/06/2024, 8:23 AM

## 2024-07-11 DIAGNOSIS — Z96649 Presence of unspecified artificial hip joint: Secondary | ICD-10-CM | POA: Diagnosis not present

## 2024-07-11 DIAGNOSIS — T8459XA Infection and inflammatory reaction due to other internal joint prosthesis, initial encounter: Secondary | ICD-10-CM | POA: Diagnosis not present

## 2024-07-12 ENCOUNTER — Encounter: Payer: Self-pay | Admitting: Physician Assistant

## 2024-07-12 ENCOUNTER — Ambulatory Visit: Admitting: Physician Assistant

## 2024-07-12 DIAGNOSIS — M009 Pyogenic arthritis, unspecified: Secondary | ICD-10-CM

## 2024-07-12 DIAGNOSIS — Z9889 Other specified postprocedural states: Secondary | ICD-10-CM

## 2024-07-12 NOTE — Progress Notes (Signed)
 Office Visit Note   Patient: [name]           Date of Birth: Feb 28, 1946           MRN: [id number] Visit Date: 07/12/2024              Requested by: Candise Aleene DEL, MD [address] [place],  [place] [address] PCP: Candise Aleene DEL, MD  Chief Complaint  Patient presents with   Left Hip - Routine Post Op    06/23/2024 I&D left hip      HPI: [name] is a  78 y.o. male with a diagnosis of Wound Infection Left Hip who failed conservative measures and elected for surgical management.  He underwent Excisional debridement soft tissue extra-articular leg and irrigation and debridement intra-articular on 06/23/24.  He was discharged on cultures showed Prior microbe mssa; this debridement both mrse and staph aureus.  He was discharged with with a PICC line 6 weeks dapto then transition to oral abx per ID.    He is performing activities as tolerates.  He is walking with a cane for balance.  He denies fever and chills.    Assessment & Plan: Visit Diagnoses: No diagnosis found.  Plan: Suture removal today he tolerates this well.  Dry dressing applied.  He has 4 more weeks of IV antibiotics then oral antibiotics likely for life due to prosthetic hip.    Follow-Up Instructions: Return if symptoms worsen or fail to improve.   Ortho Exam  Patient is alert, oriented, no adenopathy, well-dressed, normal affect, normal respiratory effort. Left hip incision healing well. No active or expressible drainage. No cellulitis or fluctuance.      Imaging:   Labs: Lab Results  Component Value Date   HGBA1C 6.4 05/06/2024   HGBA1C 5.7 05/06/2023   HGBA1C 5.4 05/27/2013   ESRSEDRATE 11 02/10/2024   ESRSEDRATE 5 01/04/2023   ESRSEDRATE 2 07/27/2020   CRP 10.8 (H) 02/10/2024   CRP 37.4 (H) 12/04/2023   CRP 22.1 (H) 10/14/2023   LABURIC 7.8 05/27/2013   REPTSTATUS 07/04/2024 FINAL 06/23/2024   GRAMSTAIN  06/23/2024    MODERATE WBC PRESENT, PREDOMINANTLY PMN NO ORGANISMS  SEEN    CULT  06/23/2024    RARE STAPHYLOCOCCUS AUREUS NO ANAEROBES ISOLATED Sent to [facility] for further susceptibility testing. SEE SEPARATE REPORT Performed at [facility], [address] [place], [place] [address]    [facility] STAPHYLOCOCCUS AUREUS 06/23/2024     Lab Results  Component Value Date   ALBUMIN  3.3 (L) 06/23/2024   ALBUMIN  3.9 05/06/2024   ALBUMIN  3.4 (L) 08/07/2023    No results found for: MG No results found for: VD25OH  No results found for: PREALBUMIN    Latest Ref Rng & Units 06/23/2024    8:20 AM 05/06/2024    8:15 AM 12/14/2023    5:09 AM  CBC EXTENDED  WBC 4.0 - 10.5 K/uL 5.2  5.7  5.7   RBC 4.22 - 5.81 MIL/uL 5.62  5.50  4.05   Hemoglobin 13.0 - 17.0 g/dL 85.6  86.9  89.7   HCT 39.0 - 52.0 % 47.2  41.5  34.1   Platelets 150 - 400 K/uL 222  251.0  216   NEUT# 1.7 - 7.7 K/uL 2.3  2.7    Lymph# 0.7 - 4.0 K/uL 2.1  2.1       There is no height or weight on file to calculate BMI.  Orders:  No orders of the defined types were placed in this encounter.  No orders of the defined types were placed in this encounter.    Procedures: No procedures performed  Clinical Data: No additional findings.  ROS:  All other systems negative, except as noted in the HPI. Review of Systems  Objective: Vital Signs: There were no vitals taken for this visit.  Specialty Comments:  No specialty comments available.  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   Pain in  left hip 09/01/2017   History of revision of total replacement of left hip joint 09/01/2017   Encounter for surveillance of recalled total hip arthroplasty hardware 06/07/2016   Status post revision of total hip replacement 06/07/2016   Spinal stenosis, lumbar region, 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  Dislocated posteriorly 2015, ? partial RC tear, has f/u planned with Dr. Addie--? surgery? possible plan for 2016.   Blood donor, whole blood    Has donated approx 30 gallons in his lifetime   Chronic left hip pain    Severe DJD.  THA 2008.  Left acetabular revision in 2017 AND 2019.   Chronic pain syndrome    Candidate for spinal cord stimulator (03/2017)   Chronic renal insufficiency, stage II (mild)    Chronic venous insufficiency    +compression hose   DDD (degenerative disc disease), lumbar 2017   Dr. Alvah ortho.  He is now wearing a new leg brace.  Has had ESI and will get another.   Diverticulosis    +redundant colon   DVT (deep venous thrombosis) (HCC) 06/14/2016   provoked, bilateral LL's; in post-op setting s/p hip surgery.  Xarelto  x 59mo at full dosing, then 6 more months at 1/2 dosing, then stopped xarelto .     Foot drop, left    s/p hip fracture 1969   History of blood transfusion    Hx of adenomatous colonic polyps    polypectomy 2007; 2012; 2015; 05/2019; recall 60yrs   Hypercholesterolemia 02/2017   Recommended statin 02/10/17 but pt declined, wanted to do TLC trial  first. Rec'd statin 04/2020   Neuropathy    Osteoarthritis    knees and hips primarily   Phlebitis    Post laminectomy syndrome    improved with spinal cord stim-->stim to be removed summer/fall 2019.   Prediabetes    04/2023 fasting 111, a1c 5.7%.  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 PSA  0.16 Feb 2020 urol f/u. [id number] Aug 2021 urol. <0.015 02/2021.   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; 02/2020   MSSA; admitted for I&D 02/11/20->plan for longterm IV abx after   Urge incontinence    vesicare helpful    Family History  Problem Relation Age of Onset   Lung cancer Mother    Heart disease Father    Colon cancer Neg Hx    Esophageal cancer Neg Hx    Rectal cancer Neg Hx    Stomach cancer Neg Hx    Colon polyps Neg Hx     Past Surgical History:  Procedure Laterality Date   ANTERIOR HIP REVISION Left 06/07/2016   Procedure: LEFT ANTERIOR HIP ACETABULAR REVISION;  Surgeon: [name], MD;  Location: [facility];  Service: Orthopedics;  Laterality: Left;   ANTERIOR HIP REVISION Left 10/09/2017   Procedure: Left hip acetabular revision;  Surgeon: Poli Lonni CINDERELLA, MD;  Location: [facility];  Service: Orthopedics;  Laterality: Left;   bilateral carpal tunnel release  2003   CARPAL TUNNEL RELEASE Bilateral    COLONOSCOPY  2007;2012;2015;05/2019   04/12/2014 Tubular adenoma x 1; 05/2019 adenomatous polyp; recall 5 yrs.   FRACTURE SURGERY Left 1969   hip, traction hospitalized for 6 weeks   INCISION AND DRAINAGE Left 08/15/2023   Procedure: INCISION AND DRAINAGE OF SEROMA LEFT THIGH;  Surgeon: Poli Lonni CINDERELLA, MD;  Location: [facility];  Service: Orthopedics;  Laterality: Left;   INCISION AND DRAINAGE Left 09/19/2023   Procedure: INCISION AND DRAINAGE LEFT THIGH WITH WOUND VAC PLACEMENT;  Surgeon: Poli Lonni CINDERELLA, MD;   Location: [facility];  Service: Orthopedics;  Laterality: Left;   INCISION AND DRAINAGE HIP Left 02/11/2020   Procedure: IRRIGATION AND DEBRIDEMENT LEFT HIP WITH ANTIBIOTIC SPACER PLACEMENT;  Surgeon: Poli Lonni CINDERELLA, MD;  Location: [facility];  Service: Orthopedics;  Laterality: Left;   INCISION AND DRAINAGE HIP Left 01/03/2023   Procedure: IRRIGATION AND DEBRIDEMENT LEFT HIP;  Surgeon: Poli Lonni CINDERELLA, MD;  Location: [facility];  Service: Orthopedics;  Laterality: Left;   INCISION AND DRAINAGE HIP Left 12/12/2023   Procedure: IRRIGATION AND DEBRIDEMENT HIP;  Surgeon: Poli Lonni CINDERELLA, MD;  Location: [facility];  Service: Orthopedics;  Laterality: Left;  Irrigation and Debridement left hip   INCISION AND DRAINAGE HIP Left 06/23/2024   Procedure: IRRIGATION AND DEBRIDEMENT HIP;  Surgeon: Harden Jerona GAILS, MD;  Location: [facility];  Service: Orthopedics;  Laterality: Left;  LEFT HIP DEBRIDEMENT   LUMBAR LAMINECTOMY/DECOMPRESSION MICRODISCECTOMY Left 03/28/2016   Patient denies: Procedure: Left Lumbar three-four Laminoforaminotomy;  Surgeon: Fairy Levels, MD;  Location: [facility];  Service: Neurosurgery;  Laterality: Left;  left   POLYPECTOMY     RADIOACTIVE SEED IMPLANT  2012   ROTATOR CUFF REPAIR  2006   right   SHOULDER ARTHROSCOPY Right    SPINAL CORD STIMULATOR IMPLANT  06/2017   removed a year after was putted in.   SPINAL CORD STIMULATOR REMOVAL     TOTAL HIP ARTHROPLASTY  2008   left ([employer])   VASECTOMY     Social History   Occupational History   Not on file  Tobacco Use   Smoking status: Never   Smokeless tobacco: Never  Vaping Use   Vaping status: Never Used  Substance and Sexual Activity   Alcohol use: No   Drug use: No   Sexual activity: Not Currently

## 2024-07-13 DIAGNOSIS — Z792 Long term (current) use of antibiotics: Secondary | ICD-10-CM | POA: Diagnosis not present

## 2024-07-13 DIAGNOSIS — T8131XD Disruption of external operation (surgical) wound, not elsewhere classified, subsequent encounter: Secondary | ICD-10-CM | POA: Diagnosis not present

## 2024-07-16 DIAGNOSIS — Z96649 Presence of unspecified artificial hip joint: Secondary | ICD-10-CM | POA: Diagnosis not present

## 2024-07-20 DIAGNOSIS — Z792 Long term (current) use of antibiotics: Secondary | ICD-10-CM | POA: Diagnosis not present

## 2024-07-22 ENCOUNTER — Other Ambulatory Visit: Payer: Self-pay

## 2024-07-22 ENCOUNTER — Ambulatory Visit: Admitting: Internal Medicine

## 2024-07-22 VITALS — BP 129/78 | HR 56 | Temp 98.1°F | Resp 16

## 2024-07-22 DIAGNOSIS — T8452XD Infection and inflammatory reaction due to internal left hip prosthesis, subsequent encounter: Secondary | ICD-10-CM | POA: Diagnosis not present

## 2024-07-22 DIAGNOSIS — Z8619 Personal history of other infectious and parasitic diseases: Secondary | ICD-10-CM

## 2024-07-22 DIAGNOSIS — B957 Other staphylococcus as the cause of diseases classified elsewhere: Secondary | ICD-10-CM

## 2024-07-22 DIAGNOSIS — T8450XD Infection and inflammatory reaction due to unspecified internal joint prosthesis, subsequent encounter: Secondary | ICD-10-CM

## 2024-07-22 DIAGNOSIS — B9561 Methicillin susceptible Staphylococcus aureus infection as the cause of diseases classified elsewhere: Secondary | ICD-10-CM | POA: Diagnosis not present

## 2024-07-22 MED ORDER — DOXYCYCLINE HYCLATE 100 MG PO TABS
100.0000 mg | ORAL_TABLET | Freq: Two times a day (BID) | ORAL | 11 refills | Status: AC
Start: 2024-07-22 — End: 2025-07-17

## 2024-07-22 NOTE — Patient Instructions (Addendum)
 Finish daptomycin  on 10/29  On 10/30 start doxycycline  100 mg twice a day indefinitely  avoid dairy, calcium , magnesium, iron, multifitamin within 2 hours of taking doxycyline. Use a full glass of water  and stay upright for half an hour. And wear sun protective clothing or sunscreen to avoid sun hypersensitivity

## 2024-07-22 NOTE — Progress Notes (Signed)
 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   Pain in left hip 09/01/2017   History of revision of total replacement of left hip joint 09/01/2017   Encounter for surveillance of recalled total hip arthroplasty hardware 06/07/2016   Status post revision of total hip replacement 06/07/2016   Spinal stenosis, lumbar region, 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   MRN: [id number]  Chief Complaint  Patient presents with   Follow-up    PJI     HPI:  [name] is a 78 y.o. 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 Here for id clinic f/u   01/16/23 id f/u 01/03/23 operative culture mssa Patient  doing well Hip incision has one little spot still not closed yet. All sutures intact. There is a lot of serosanguinous drainage and he had to change pad 2-3 times a day depending on activity No pain along the incision or in the groin.  He'll see dr Vernetta today  He is taking also the cefadroxil  together with cefazolin  which he didn't know he not supposed to   02/21/23 id clinic f/u Doing well on cephadroxil on 02/15/23; picc removed No hip pain Opat labs normalized crp The incision had fully closed as of 02/20/23   02/10/24 id clinic f/u Doing ok, still some drainage/unhealed incision No pain No f/c No n/v/diarrhea No other complaints   07/22/24 id clinic f/u See a&p Patient walks with cane today   Allergies  Allergen Reactions   Adhesive [Tape] Rash    Paper tape is ok      Outpatient Medications Prior to Visit  Medication Sig Dispense Refill   acetaminophen  (TYLENOL ) 500 MG tablet Take 1,000 mg by mouth 2 (two) times daily as needed for moderate pain or headache.      aspirin  EC 81 MG tablet Take 81 mg by mouth daily.     atorvastatin  (LIPITOR) 20 MG tablet Take 1 tablet (20 mg total) by mouth daily. 90 tablet 3   Calcium  Carb-Cholecalciferol  (CALCIUM  + D3 PO) Take 600 mg by mouth daily.     daptomycin  (CUBICIN ) IVPB Inject 700 mg into the vein daily. Indication:  Chronic left hip PJI First Dose: Yes Last Day of Therapy:  08/04/24 Labs -  Once weekly:  CBC/D, BMP, and CPK Labs - Once weekly: ESR and CRP Method of administration: IV Push Method of administration may be changed at the discretion of home infusion pharmacist based upon assessment of the patient and/or caregiver's ability to self-administer the medication ordered. 37 Units 0   methocarbamol  (ROBAXIN ) 500 MG tablet Take 1 tablet (500 mg total) by mouth every 6 (six) hours as needed for muscle spasms. 30 tablet 0   oxyCODONE  (OXY IR/ROXICODONE ) 5 MG immediate release tablet Take 1 tablet (5 mg total) by mouth  every 6 (six) hours as needed for moderate pain (pain score 4-6). 30 tablet 0   solifenacin (VESICARE) 10 MG tablet Take 10 mg by mouth daily.     No facility-administered medications prior to visit.     Social History   Socioeconomic History   Marital status: Married    Spouse name: Not on file   Number of children: Not on file   Years of education: Not on file   Highest education level: Not on file  Occupational History   Not on file  Tobacco Use   Smoking status: Never   Smokeless tobacco: Never  Vaping Use   Vaping status: Never Used  Substance and Sexual Activity   Alcohol use: No   Drug use: No   Sexual activity: Not Currently  Other Topics Concern   Not on file  Social History Narrative   Married, 2 biologic children, 2 adopted, 5 GC.   Lives in [place].  Worked 35 yrs at [employer].   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   No formal exercise.  No T/A/Ds.   Enjoys woodworking.   Social Drivers of Corporate investment banker Strain: Low Risk  (03/31/2024)   Overall Financial Resource Strain (CARDIA)    Difficulty of Paying Living Expenses: Not hard at all  Food Insecurity: No Food Insecurity (06/29/2024)   Hunger Vital Sign    Worried About Running Out of Food in the Last Year: Never true    Ran Out of Food in the Last Year: Never true  Transportation Needs: No Transportation Needs (06/29/2024)   PRAPARE - Administrator, Civil Service (Medical): No    Lack of Transportation (Non-Medical): No  Physical Activity: Insufficiently Active (03/31/2024)   Exercise Vital Sign    Days of Exercise per Week: 4 days    Minutes of Exercise per Session: 30 min  Stress: No Stress Concern Present (03/31/2024)   [employer] of Occupational Health - Occupational Stress Questionnaire    Feeling of Stress: Not at all  Social Connections: Socially Integrated (06/23/2024)   Social Connection and Isolation Panel    Frequency of Communication with  Friends and Family: Three times a week    Frequency of Social Gatherings with Friends and Family: More than three times a week    Attends Religious Services: More than 4 times per year    Active Member of [employer] or Organizations: Yes    Attends Engineer, structural: More than 4 times per year    Marital Status: Married  Catering manager Violence: Not At Risk (06/29/2024)   Humiliation, Afraid, Rape, and Kick questionnaire    Fear of Current or Ex-Partner: No    Emotionally Abused: No    Physically Abused: No    Sexually Abused: No      Review of Systems  Eyes:  Negative for visual disturbance.  All other ros negative   Objective:    There were no vitals taken for this visit. Nursing note and vital signs reviewed.  Physical Exam     General/constitutional: no distress, pleasant HEENT: Normocephalic, PER, Conj Clear, EOMI, Oropharynx clear Neck supple CV: rrr no mrg Lungs: clear to auscultation, normal respiratory effort Abd: Soft, Nontender Ext: no edema Neuro: nonfocal MSK: left hip wound thigh where the previous hematoma was drained dressing clean/dry  Skin: Incision healed, atrophic scarring; no tenderness; no fluctuance/warmth   Picc right ue site no erythema/tenderness   Labs: Lab Results  Component Value Date   WBC 5.2 06/23/2024   HGB 14.3 06/23/2024   HCT 47.2 06/23/2024   MCV 84.0 06/23/2024   PLT 222 06/23/2024   Last metabolic panel Lab Results  Component Value Date   GLUCOSE 114 (H) 06/28/2024   NA 136 06/28/2024   K 4.3 06/28/2024   CL 105 06/28/2024   CO2 23 06/28/2024   BUN 22 06/28/2024   CREATININE 1.28 (H) 06/28/2024   GFRNONAA 57 (L) 06/28/2024   CALCIUM  9.8 06/28/2024   PHOS 2.5 05/21/2010   PROT 6.5 06/23/2024   ALBUMIN  3.3 (L) 06/23/2024   BILITOT 0.8 06/23/2024   ALKPHOS 78 06/23/2024   AST 22 06/23/2024   ALT 14 06/23/2024   ANIONGAP 8 06/28/2024      Component Value Date/Time   CRP 10.8 (H) 02/10/2024  0905   CRP 37.4 (H) 12/04/2023 0838   CRP 22.1 (H) 10/14/2023 1425     Micro:  Serology:  Imaging:  Assessment & Plan:   Problem List Items Addressed This Visit   None        No orders of the defined types were placed in this encounter.      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 Here for id clinic f/u   He is followed by dr Elaine. Last seen 05/08/22 in clinic. Chart mention 03/2020 had a short course of rifampin  combination cefazolin  during the most recent I&D prior to this admission.    S/p I&D 01/03/23 this admission. Cx mssa again (grew in 2017, 2019, and 2021). 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 Hardware retained     I am not optimistic in curing this or suppressing this given the history/operative finding   No role for rifampin  in his case   Had planned 6 weeks iv cefazolin  then transitioned to indefinite suppression on 02/14/23.   ------------ 01/16/23 id assessment  #lab monitoring 4/08 opat labs cbc 5/14/213; cr 0.8; crp 19 (<10)  #prosthetic joint recurrent/chronic infection -- mssa S/p multiple surgery; last washout 01/03/23 with I&D and sinus tract removal -- no hardware exchange  -advise to stop cefadroxil  for now while on cefazolin  -once done with cefazolin  on  02/14/23, can remove picc and start cefadroxil  indefinitely  02/21/23 id assessment Labs and clinically looking very good Will continue cefadroxil  1000 mg twice a day for the next 6 months; after that if  clinically looking good still will decrease to 500 mg twice a day indefinitely  F/u in 3 months   05/20/23 id assessment Doing well He thinks since he's been taking twice a day high dose cefadroxil  he is more prone to bruising. He has a big hematoma on left buttock from just riding lawn mower He takes asa 81 mg once a day; no herbal supplement; no anticoagulant It would be very unusual if related to cefadroxil , but we can see labs today and check platelet level Tolerating cefadroxil  otherwise  Advise to talk to primary care about aspirin  use if further hematoma forms    10/14/23 id clinic assessment Patient got admitted 09/19/23 and underwent a left posterior thigh superficial fluid collection I&D presumed seroma. No culture sent I reviewed op note from dr Lonni Poli. Some necrotic tissue found but no purulence. He remains on cefadroxil  twice a day 2 tablet twice a day. As he is at least 6 months out of the initial infection and recent seroma description not appeared to be infectious, will go to 1 tablet twice a day (500 mg bid) Labs today  F/u 3 months  Given so many infection with staph aureus in the past in the same hip, will keep on cefadroxil  indefinitely likey 500 twice a day   12/04/23 id assessment Patient decreased dose of cefadroxil  to 500 mg bid 10/14/23; he fell 2 weeks ago but didn't remember falling on the left hip. Within a day of the fall he started having redness/blistering at the left lateral hip incision No fever, chill We discussed over the phone and 3 days prior to this visit increased back to 1000 mg twice a day Patient saw dr Poli in clinic 4 days ago who was trying to express any pus but nothing was obtained He'll see dr Poli next week His redness and blister is improving. 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 and this happened while on suppressive  antibitiotics  Await further evolution of cellulitis and hopefully we an avoid more surgery   Labs obtained today  ---------- 01/01/24  Unfortunately with high dose cefadroxil  he still needed debridement; admission 3/7-10 and operative tissue cx continues to grow mrsa (operative note does mention a sinus tract down to the joint; no hardware removed/exchanged) Patient had extensive discussion with dr Poli before and again that without hardware removal prognosis is unfavorable but at this time patient has several issues to take care of and not amenable to that He tolerates high dose cefadroxil  and will continue for at least 3 more months if not longer with this high dose F/u 4 weeks and will get surveillant labs    02/10/24 id assessment Overall stable no real concerning sx but discussed that the nonhealing wound and history of chronic mssa pji are in itself bad prognosis Don't know how long we can go until next I&D is needed but do anticipate that  Discuss some possible experiemental phage therapy and ask if patient wants to be referred (I know [facility] in [place] might be doing some)  He'll review information and let me know  For now will continue cefadroxil  1 gram bid  Labs today  F/u 3 months   07/22/24 id clinic assessment  Patient admitted 06/23/24 for another joint infection of the same side. Poly exchange and now grew 2 bugs, mssa, and mrse; both are sensitive to doxycycline /bactrim  the previous mssa is sensitive to doxycycline  as well At one point he also has mrsa (12/12/23) underwent I&D to the joint but no joint hardware exchanged. ID team not consulted; mrsa grew and patient was continued on cefadroxil  This time 06/23/24 he came in and had I&D again all the way into the joint. The hardware remains in place  It appears the last time the joint was revised was 2019. He has had several I&D after that and no modular component change was made  He was discharged on daptomycin  opat  with eot on 08/04/24.   Given the recent new bacteria besides mssa (mrse and mrsa), will plan to suppress him with doxycycline  starting 08/05/24 when iv dapto is finished   Reviewed opat from 07/21/24 Cbc 5/12/282; cr 1.3; lft normal; cpk 217 Crp 25 <<-- 53 Esr 49 <<-- 47   -picc removal order on 10/29 placed on 06/28/24 opat / id note -doxycycline  prescription today and patient can pick up and start on 08/05/24 -> 100 mg twice a day; avoid dairy, calcium , magnesium, iron, multifitamin within 2 hours of taking doxycyline. Use a full glass of water  and stay upright for half an hour. And wear sun protective clothing or sunscreen to avoid sun hypersensitivity -discuss high risk failure tx -follow up with me in 4 weeks -plan to get labs at that time in clinic    Follow-up: Return in about 4 weeks (around 08/19/2024).       Constance ONEIDA Passer, MD [facility] 07/22/2024, 8:43 AM

## 2024-07-24 DIAGNOSIS — T8459XA Infection and inflammatory reaction due to other internal joint prosthesis, initial encounter: Secondary | ICD-10-CM | POA: Diagnosis not present

## 2024-07-24 DIAGNOSIS — Z96649 Presence of unspecified artificial hip joint: Secondary | ICD-10-CM | POA: Diagnosis not present

## 2024-07-27 DIAGNOSIS — Z792 Long term (current) use of antibiotics: Secondary | ICD-10-CM | POA: Diagnosis not present

## 2024-07-27 DIAGNOSIS — M71052 Abscess of bursa, left hip: Secondary | ICD-10-CM | POA: Diagnosis not present

## 2024-07-30 DIAGNOSIS — Z96649 Presence of unspecified artificial hip joint: Secondary | ICD-10-CM | POA: Diagnosis not present

## 2024-08-03 DIAGNOSIS — T8131XA Disruption of external operation (surgical) wound, not elsewhere classified, initial encounter: Secondary | ICD-10-CM | POA: Diagnosis not present

## 2024-08-03 DIAGNOSIS — Z792 Long term (current) use of antibiotics: Secondary | ICD-10-CM | POA: Diagnosis not present

## 2024-08-09 ENCOUNTER — Encounter: Payer: Self-pay | Admitting: Radiology

## 2024-08-19 ENCOUNTER — Ambulatory Visit: Admitting: Internal Medicine

## 2024-08-19 ENCOUNTER — Other Ambulatory Visit: Payer: Self-pay

## 2024-08-19 VITALS — BP 149/75 | HR 60 | Temp 97.7°F | Resp 16 | Wt 251.0 lb

## 2024-08-19 DIAGNOSIS — T8452XD Infection and inflammatory reaction due to internal left hip prosthesis, subsequent encounter: Secondary | ICD-10-CM | POA: Diagnosis not present

## 2024-08-19 NOTE — Patient Instructions (Addendum)
 Labs today to monitor liver/kidney function   Continue doxycycline  100 mg twice a day Avoid calcium , magnesium, iron, multivitamin, dairy products within 2 hours of before/after taking doxycycline    6 months follow up

## 2024-08-19 NOTE — Progress Notes (Signed)
 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   Pain in left hip 09/01/2017   History of revision of total replacement of left hip joint 09/01/2017   Encounter for surveillance of recalled total hip arthroplasty hardware 06/07/2016   Status post revision of total hip replacement 06/07/2016   Spinal stenosis, lumbar region, 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   Red blood cell abnormality 02/11/2016   Elevated blood pressure reading without diagnosis of hypertension 07/01/2013   Peripheral neuropathy 06/02/2013   Health maintenance examination 11/26/2011   Prostate cancer (HCC) 05/21/2010   Obesity, Class I, BMI 30-34.9 05/18/2010   Venous (peripheral) insufficiency 05/18/2010   Osteoarthrosis involving lower leg 05/18/2010      Subjective:    Patient ID: [name], male    DOB: 07/21/1946, 78 y.o.   MRN: [id number]  Chief Complaint  Patient presents with   Follow-up    PJI     HPI:  [name] is a 78 y.o. 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 Here for id clinic f/u   01/16/23 id f/u 01/03/23 operative culture mssa Patient  doing well Hip incision has one little spot still not closed yet. All sutures intact. There is a lot of serosanguinous drainage and he had to change pad 2-3 times a day depending on activity No pain along the incision or in the groin.  He'll see dr Vernetta today  He is taking also the cefadroxil  together with cefazolin  which he didn't know he not supposed to   02/21/23 id clinic f/u Doing well on cephadroxil on 02/15/23; picc removed No hip pain Opat labs normalized crp The incision had fully closed as of 02/20/23   02/10/24 id clinic f/u Doing ok, still some drainage/unhealed incision No pain No f/c No n/v/diarrhea No other complaints   08/19/24 id clinic f/u See a&p Patient walks with cane today No complaints     Allergies  Allergen Reactions   Adhesive [Tape] Rash    Paper tape is ok      Outpatient Medications Prior to Visit  Medication Sig Dispense Refill   acetaminophen  (TYLENOL ) 500 MG tablet Take 1,000 mg by mouth 2 (two) times daily as needed for moderate pain or headache.      aspirin  EC 81 MG tablet Take 81 mg by mouth daily.     Calcium  Carb-Cholecalciferol  (CALCIUM  + D3 PO) Take 600 mg by mouth daily.     doxycycline  (VIBRA -TABS) 100 MG tablet Take 1 tablet (100 mg total) by mouth 2 (two) times daily. 60 tablet 11   solifenacin (VESICARE) 10 MG tablet Take 10 mg by mouth daily.     atorvastatin  (LIPITOR) 20 MG tablet  Take 1 tablet (20 mg total) by mouth daily. (Patient not taking: Reported on 08/19/2024) 90 tablet 3   methocarbamol  (ROBAXIN ) 500 MG tablet Take 1 tablet (500 mg total) by mouth every 6 (six) hours as needed for muscle spasms. (Patient not taking: Reported on 08/19/2024) 30 tablet 0   oxyCODONE  (OXY IR/ROXICODONE ) 5 MG immediate release tablet Take 1 tablet (5 mg total) by mouth every 6 (six) hours as needed for moderate pain (pain score 4-6). (Patient not taking: Reported on 08/19/2024) 30 tablet 0   No facility-administered medications prior  to visit.     Social History   Socioeconomic History   Marital status: Married    Spouse name: Not on file   Number of children: Not on file   Years of education: Not on file   Highest education level: Not on file  Occupational History   Not on file  Tobacco Use   Smoking status: Never   Smokeless tobacco: Never  Vaping Use   Vaping status: Never Used  Substance and Sexual Activity   Alcohol use: No   Drug use: No   Sexual activity: Not Currently  Other Topics Concern   Not on file  Social History Narrative   Married, 2 biologic children, 2 adopted, 5 GC.   Lives in [place].  Worked 35 yrs at [employer].   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   No formal exercise.  No T/A/Ds.   Enjoys woodworking.   Social Drivers of Corporate Investment Banker Strain: Low Risk  (03/31/2024)   Overall Financial Resource Strain (CARDIA)    Difficulty of Paying Living Expenses: Not hard at all  Food Insecurity: No Food Insecurity (06/29/2024)   Hunger Vital Sign    Worried About Running Out of Food in the Last Year: Never true    Ran Out of Food in the Last Year: Never true  Transportation Needs: No Transportation Needs (06/29/2024)   PRAPARE - Administrator, Civil Service (Medical): No    Lack of Transportation (Non-Medical): No  Physical Activity: Insufficiently Active (03/31/2024)   Exercise Vital Sign    Days of Exercise per Week: 4 days    Minutes of Exercise per Session: 30 min  Stress: No Stress Concern Present (03/31/2024)   [employer] of Occupational Health - Occupational Stress Questionnaire    Feeling of Stress: Not at all  Social Connections: Socially Integrated (06/23/2024)   Social Connection and Isolation Panel    Frequency of Communication with Friends and Family: Three times a week    Frequency of Social Gatherings with Friends and Family: More than three times a week    Attends Religious Services: More than 4 times per year     Active Member of [employer] or Organizations: Yes    Attends Engineer, Structural: More than 4 times per year    Marital Status: Married  Catering Manager Violence: Not At Risk (06/29/2024)   Humiliation, Afraid, Rape, and Kick questionnaire    Fear of Current or Ex-Partner: No    Emotionally Abused: No    Physically Abused: No    Sexually Abused: No      Review of Systems  Eyes:  Negative for visual disturbance.      All other ros negative   Objective:    BP (!) 149/75   Pulse 60   Temp 97.7 F (36.5 C) (Temporal)   Resp 16   Wt 251 lb (  113.9 kg)   SpO2 98%   BMI 33.12 kg/m  Nursing note and vital signs reviewed.  Physical Exam     General/constitutional: no distress, pleasant HEENT: Normocephalic, PER, Conj Clear, EOMI, Oropharynx clear Neck supple CV: rrr no mrg Lungs: clear to auscultation, normal respiratory effort Abd: Soft, Nontender Ext: no edema Neuro: nonfocal Skin/MSK: left hip incision size all closed no swelling/tenderness/redness   Labs: Lab Results  Component Value Date   WBC 5.2 06/23/2024   HGB 14.3 06/23/2024   HCT 47.2 06/23/2024   MCV 84.0 06/23/2024   PLT 222 06/23/2024   Last metabolic panel Lab Results  Component Value Date   GLUCOSE 114 (H) 06/28/2024   NA 136 06/28/2024   K 4.3 06/28/2024   CL 105 06/28/2024   CO2 23 06/28/2024   BUN 22 06/28/2024   CREATININE 1.28 (H) 06/28/2024   GFRNONAA 57 (L) 06/28/2024   CALCIUM  9.8 06/28/2024   PHOS 2.5 05/21/2010   PROT 6.5 06/23/2024   ALBUMIN  3.3 (L) 06/23/2024   BILITOT 0.8 06/23/2024   ALKPHOS 78 06/23/2024   AST 22 06/23/2024   ALT 14 06/23/2024   ANIONGAP 8 06/28/2024      Component Value Date/Time   CRP 10.8 (H) 02/10/2024 0905   CRP 37.4 (H) 12/04/2023 0838   CRP 22.1 (H) 10/14/2023 1425     Micro:  Serology:  Imaging:  Assessment & Plan:   Problem List Items Addressed This Visit     Prosthetic joint infection of left hip - Primary   Relevant  Orders   CBC   COMPLETE METABOLIC PANEL WITHOUT GFR         No orders of the defined types were placed in this encounter.      Abx: 08/05/24-c doxycycline   06/27/24-08/04/24 daptomycin  02/15/23-9/21 cefadroxil  3/30-5/10 cefazolin      Assessment: 78 yo male with left hip arthroplasty initially 2008, complicated by hardware failure s/p revision 2017 found to have mssa infection, subsequently underwent another revision in 2019 and then washout 2021 with retention of hardware, on suppressive abx cephalexin , readmitted 01/03/23 for drainage in the left groin, without sepsis. Here for id clinic f/u   He is followed by dr Elaine. Last seen 05/08/22 in clinic. Chart mention 03/2020 had a short course of rifampin  combination cefazolin  during the most recent I&D prior to this admission.    S/p I&D 01/03/23 this admission. Cx mssa again (grew in 2017, 2019, and 2021). 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 Hardware retained     I am not optimistic in curing this or suppressing this given the history/operative finding   No role for rifampin  in his case   Had planned 6 weeks iv cefazolin  then transitioned to indefinite suppression on 02/14/23.   ------------ 01/16/23 id assessment  #lab monitoring 4/08 opat labs cbc 5/14/213; cr 0.8; crp 19 (<10)  #prosthetic joint recurrent/chronic infection -- mssa S/p multiple surgery; last washout 01/03/23 with I&D and sinus tract removal -- no hardware exchange  -advise to stop cefadroxil  for now while on cefazolin  -once done with cefazolin  on 02/14/23, can remove picc and start cefadroxil  indefinitely  02/21/23 id assessment Labs and clinically looking very good Will continue cefadroxil  1000 mg twice a day for the next 6 months; after that if clinically looking good still will decrease to 500 mg twice a day indefinitely  F/u in 3 months   05/20/23 id assessment Doing well He thinks since he's been  taking twice a day high dose cefadroxil  he is more prone to bruising. He has a big hematoma on left buttock from just riding lawn mower He takes asa 81 mg once a day; no herbal supplement; no anticoagulant It would be very unusual if related to cefadroxil , but we can see labs today and check platelet level Tolerating cefadroxil  otherwise  Advise to talk to primary care about aspirin  use if further hematoma forms    10/14/23 id clinic assessment Patient got admitted 09/19/23 and underwent a left posterior thigh superficial fluid collection I&D presumed seroma. No culture sent I reviewed op note from dr Lonni Poli. Some necrotic tissue found but no purulence. He remains on cefadroxil  twice a day 2 tablet twice a day. As he is at least 6 months out of the initial infection and recent seroma description not appeared to be infectious, will go to 1 tablet twice a day (500 mg bid) Labs today  F/u 3 months  Given so many infection with staph aureus in the past in the same hip, will keep on cefadroxil  indefinitely likey 500 twice a day   12/04/23 id assessment Patient decreased dose of cefadroxil  to 500 mg bid 10/14/23; he fell 2 weeks ago but didn't remember falling on the left hip. Within a day of the fall he started having redness/blistering at the left lateral hip incision No fever, chill We discussed over the phone and 3 days prior to this visit increased back to 1000 mg twice a day Patient saw dr Poli in clinic 4 days ago who was trying to express any pus but nothing was obtained He'll see dr Poli next week His redness and blister is improving. 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 and this happened while on suppressive antibitiotics  Await further evolution of cellulitis and hopefully we an avoid more surgery   Labs obtained today  ---------- 01/01/24  Unfortunately with high dose  cefadroxil  he still needed debridement; admission 3/7-10 and operative tissue cx continues to grow mrsa (operative note does mention a sinus tract down to the joint; no hardware removed/exchanged) Patient had extensive discussion with dr Poli before and again that without hardware removal prognosis is unfavorable but at this time patient has several issues to take care of and not amenable to that He tolerates high dose cefadroxil  and will continue for at least 3 more months if not longer with this high dose F/u 4 weeks and will get surveillant labs    02/10/24 id assessment Overall stable no real concerning sx but discussed that the nonhealing wound and history of chronic mssa pji are in itself bad prognosis Don't know how long we can go until next I&D is needed but do anticipate that  Discuss some possible experiemental phage therapy and ask if patient wants to be referred (I know [facility] in [place] might be doing some)  He'll review information and let me know  For now will continue cefadroxil  1 gram bid  Labs today  F/u 3 months   07/22/24 id clinic assessment Patient admitted 06/23/24 for another joint infection of the same side. Poly exchange and now grew 2 bugs, mssa, and mrse; both are sensitive to doxycycline /bactrim  the previous mssa is sensitive to doxycycline  as well At one point he also has mrsa (12/12/23) underwent I&D to the joint but no joint hardware exchanged. ID team not consulted; mrsa grew and patient was continued on cefadroxil   This time 06/23/24 he came in and had I&D again all the way into the joint. The hardware remains in place  It appears the last time the joint was revised was 2019. He has had several I&D after that and no modular component change was made  He was discharged on daptomycin  opat with eot on 08/04/24.   Given the recent new bacteria besides mssa (mrse and mrsa), will plan to suppress him with doxycycline  starting 08/05/24 when iv dapto is  finished   Reviewed opat from 07/21/24 Cbc 5/12/282; cr 1.3; lft normal; cpk 217 Crp 25 <<-- 53 Esr 49 <<-- 47   -picc removal order on 10/29 placed on 06/28/24 opat / id note -doxycycline  prescription today and patient can pick up and start on 08/05/24 -> 100 mg twice a day; avoid dairy, calcium , magnesium, iron, multifitamin within 2 hours of taking doxycyline. Use a full glass of water  and stay upright for half an hour. And wear sun protective clothing or sunscreen to avoid sun hypersensitivity -discuss high risk failure tx -follow up with me in 4 weeks -plan to get labs at that time in clinic    08/19/24 id assessment Stable focal nerve pain  Clinically infection appears suppressed Discussed lifelong antibiotics therapy Fortunately we have doxycycline  options for previous different flavors of staphylococcus species  -cbc/cmp today to assess for side effect long term antibiotics therapy -discuss no need to do crp/esr as long as he remains on antibiotics and no focal concern for relapse of infection -reviewed ddi with doxy and food and advise sunscreen use as needed -patient can f/u with ortho as discussed with them -follow up with id in 6 months   Follow-up: Return in about 6 months (around 02/16/2025).       Constance ONEIDA Passer, MD [facility] 08/19/2024, 8:40 AM

## 2024-08-20 LAB — COMPLETE METABOLIC PANEL WITHOUT GFR
AG Ratio: 1.4 (calc) (ref 1.0–2.5)
ALT: 11 U/L (ref 9–46)
AST: 14 U/L (ref 10–35)
Albumin: 3.8 g/dL (ref 3.6–5.1)
Alkaline phosphatase (APISO): 86 U/L (ref 35–144)
BUN/Creatinine Ratio: 14 (calc) (ref 6–22)
BUN: 20 mg/dL (ref 7–25)
CO2: 26 mmol/L (ref 20–32)
Calcium: 10.1 mg/dL (ref 8.6–10.3)
Chloride: 107 mmol/L (ref 98–110)
Creat: 1.38 mg/dL — ABNORMAL HIGH (ref 0.70–1.28)
Globulin: 2.8 g/dL (ref 1.9–3.7)
Glucose, Bld: 99 mg/dL (ref 65–99)
Potassium: 4.2 mmol/L (ref 3.5–5.3)
Sodium: 141 mmol/L (ref 135–146)
Total Bilirubin: 0.4 mg/dL (ref 0.2–1.2)
Total Protein: 6.6 g/dL (ref 6.1–8.1)

## 2024-08-20 LAB — CBC
HCT: 46.1 % (ref 38.5–50.0)
Hemoglobin: 14.2 g/dL (ref 13.2–17.1)
MCH: 26 pg — ABNORMAL LOW (ref 27.0–33.0)
MCHC: 30.8 g/dL — ABNORMAL LOW (ref 32.0–36.0)
MCV: 84.3 fL (ref 80.0–100.0)
MPV: 10.6 fL (ref 7.5–12.5)
Platelets: 248 Thousand/uL (ref 140–400)
RBC: 5.47 Million/uL (ref 4.20–5.80)
RDW: 15.2 % — ABNORMAL HIGH (ref 11.0–15.0)
WBC: 5.9 Thousand/uL (ref 3.8–10.8)

## 2024-09-15 ENCOUNTER — Ambulatory Visit (HOSPITAL_BASED_OUTPATIENT_CLINIC_OR_DEPARTMENT_OTHER)
Admission: RE | Admit: 2024-09-15 | Discharge: 2024-09-15 | Disposition: A | Discharge status: Home / Self Care | Source: Ambulatory Visit | Attending: Urology | Admitting: Urology

## 2024-09-15 ENCOUNTER — Other Ambulatory Visit

## 2024-09-15 DIAGNOSIS — M899 Disorder of bone, unspecified: Secondary | ICD-10-CM

## 2024-09-15 DIAGNOSIS — M8589 Other specified disorders of bone density and structure, multiple sites: Secondary | ICD-10-CM | POA: Insufficient documentation

## 2024-10-06 ENCOUNTER — Telehealth: Payer: Self-pay

## 2024-10-06 NOTE — Telephone Encounter (Signed)
 Patient called stating that his hip is inflamed. Wanted to know if ne needed to increase abx or does he need to reach out to ortho.  Shineka Auble SHAUNNA Letters, CMA

## 2024-10-08 ENCOUNTER — Telehealth: Payer: Self-pay | Admitting: Orthopaedic Surgery

## 2024-10-08 NOTE — Telephone Encounter (Signed)
 Patient aware. Will contact ortho.

## 2024-10-08 NOTE — Telephone Encounter (Signed)
 Pt request an  ASAP appointment for his left hip (possible infection again) and to discuss possible surgery.   Pt's call back [phone] or [phone]

## 2024-10-11 ENCOUNTER — Encounter: Payer: Self-pay | Admitting: Orthopaedic Surgery

## 2024-10-11 ENCOUNTER — Ambulatory Visit (INDEPENDENT_AMBULATORY_CARE_PROVIDER_SITE_OTHER): Admitting: Orthopaedic Surgery

## 2024-10-11 DIAGNOSIS — T8452XS Infection and inflammatory reaction due to internal left hip prosthesis, sequela: Secondary | ICD-10-CM | POA: Diagnosis not present

## 2024-10-11 NOTE — Progress Notes (Signed)
 The patient is well and was.  He is 79 years old and dealing with a chronic left hip prosthetic joint infection.  He had a fall recently landing on his backside and has had some swelling and redness in his left hip since then.  He says it does go down overnight.  On exam the incision has a chronic look to it like it always has but there is a small area that I unroofed and there was serosanguineous drainage.  There is no redness spreading out from this and I tried to get all the fluid out of it that I could.  Will see him back in 1 week.  I talked about the possibility of an outpatient incision and drainage of his hip with closure and a portable wound VAC for a week if needed.  Will see what he looks like next Monday.  However if things worsen before then he will let us  know.

## 2024-10-14 ENCOUNTER — Ambulatory Visit: Payer: Self-pay | Admitting: Internal Medicine

## 2024-10-18 ENCOUNTER — Ambulatory Visit: Admitting: Orthopaedic Surgery

## 2024-10-18 ENCOUNTER — Other Ambulatory Visit: Payer: Self-pay | Admitting: Physician Assistant

## 2024-10-18 ENCOUNTER — Encounter: Payer: Self-pay | Admitting: Orthopaedic Surgery

## 2024-10-18 DIAGNOSIS — M009 Pyogenic arthritis, unspecified: Secondary | ICD-10-CM

## 2024-10-18 DIAGNOSIS — T8452XS Infection and inflammatory reaction due to internal left hip prosthesis, sequela: Secondary | ICD-10-CM

## 2024-10-18 DIAGNOSIS — Z01818 Encounter for other preprocedural examination: Secondary | ICD-10-CM

## 2024-10-18 NOTE — Progress Notes (Signed)
 [name] comes in today with continued chronic infection of his left total hip prosthesis.  The wound drainage is gone down significantly.  He has someone with retained hardware and a known chronic infection and is on antibiotics.  At this point we are advocating for another incision and drainage/irrigation debridement of his left hip.  He knows this is still only going to temporize things but he is still not consenting to have the hardware removed.  He still takes care of his wife who has some dementia.  He does ambulate with a walking stick.  On exam the wound is better than it look last week but it is still in need of an incision and drainage.  I believe this can be done as an outpatient.  We would open the wound up washed out thoroughly and placed dissolvable antibiotic beads.  We would then closed the incision and place a Prevena wound VAC for going home with and he go home the same day.  We should try to set this up for surgery for this Thursday versus next Thursday and he understands this as well.  We would then see him back at 2 weeks postoperative.

## 2024-10-20 ENCOUNTER — Other Ambulatory Visit: Payer: Self-pay

## 2024-10-20 ENCOUNTER — Encounter (HOSPITAL_COMMUNITY): Payer: Self-pay | Admitting: Orthopaedic Surgery

## 2024-10-20 NOTE — H&P (Signed)
 [name] is an 79 y.o. male.   Chief Complaint: Chronic left hip joint infection HPI: The patient is a 79 year old gentleman with a known chronic prosthetic hip joint infection who is on chronic antibiotic suppression.  He has had multiple incision and drainages/irrigation debridements of this left hip over time.  The only optimal way to clear the infection would be excision of all components.  However, the patient still wishes to keep the components in place because he would be basically have such significant limitations in his mobility and he is not willing to deal with that.  He also takes care of his wife who has some dementia.  Over the years we have taken him to surgery when necessary or indicated to washout the hip.  Recently he has developed a slightly red wound again with increased pain and has had some intermittent drainage from the left hip incision.  At this point we have recommended a repeat incision and drainage of the hip with retaining components and continuing his antibiotics.  He is followed closely by the infectious disease service.  Past Medical History:  Diagnosis Date   Anterior dislocation of right shoulder 09/2013   Reduced in ED under sedation.  Dislocated posteriorly 2015, ? partial RC tear, has f/u planned with Dr. Addie--? surgery? possible plan for 2016.   Blood donor, whole blood    Has donated approx 30 gallons in his lifetime   Chronic left hip pain    Severe DJD.  THA 2008.  Left acetabular revision in 2017 AND 2019.   Chronic pain syndrome    Candidate for spinal cord stimulator (03/2017)   Chronic renal insufficiency, stage II (mild)    Chronic venous insufficiency    +compression hose   DDD (degenerative disc disease), lumbar 2017   Dr. Alvah ortho.  He is now wearing a new leg brace.  Has had ESI and will get another.   Diverticulosis    +redundant colon   DVT (deep venous thrombosis) (HCC) 06/14/2016   provoked, bilateral LL's; in  post-op setting s/p hip surgery.  Xarelto  x 21mo at full dosing, then 6 more months at 1/2 dosing, then stopped xarelto .     Foot drop, left    s/p hip fracture 1969   History of blood transfusion    Hx of adenomatous colonic polyps    polypectomy 2007; 2012; 2015; 05/2019; recall 14yrs   Hypercholesterolemia 02/2017   Recommended statin 02/10/17 but pt declined, wanted to do TLC trial first. Rec'd statin 04/2020   Neuropathy    Osteoarthritis    knees and hips primarily   Phlebitis    Post laminectomy syndrome    improved with spinal cord stim-->stim to be removed summer/fall 2019.   Prediabetes    04/2023 fasting 111, a1c 5.7%.  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 PSA 0.16 Feb 2020 urol f/u. [id number] Aug 2021 urol. <0.015 02/2021.   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; 02/2020   MSSA; admitted for I&D 02/11/20->plan for longterm IV abx after   Urge incontinence    vesicare helpful    Past Surgical History:  Procedure Laterality Date   ANTERIOR HIP REVISION Left 06/07/2016   Procedure: LEFT ANTERIOR HIP ACETABULAR REVISION;  Surgeon: [name], MD;  Location: [facility];  Service: Orthopedics;  Laterality: Left;   ANTERIOR HIP REVISION  Left 10/09/2017   Procedure: Left hip acetabular revision;  Surgeon: Vernetta Lonni GRADE, MD;  Location: [facility];  Service: Orthopedics;  Laterality: Left;   bilateral carpal tunnel release  2003   CARPAL TUNNEL RELEASE Bilateral    COLONOSCOPY  2007;2012;2015;05/2019   04/12/2014 Tubular adenoma x 1; 05/2019 adenomatous polyp; recall 5 yrs.   FRACTURE SURGERY Left 1969   hip, traction hospitalized for 6 weeks   INCISION AND DRAINAGE Left 08/15/2023   Procedure: INCISION AND DRAINAGE OF SEROMA LEFT THIGH;  Surgeon: Vernetta Lonni GRADE, MD;  Location: [facility];  Service:  Orthopedics;  Laterality: Left;   INCISION AND DRAINAGE Left 09/19/2023   Procedure: INCISION AND DRAINAGE LEFT THIGH WITH WOUND VAC PLACEMENT;  Surgeon: Vernetta Lonni GRADE, MD;  Location: [facility];  Service: Orthopedics;  Laterality: Left;   INCISION AND DRAINAGE HIP Left 02/11/2020   Procedure: IRRIGATION AND DEBRIDEMENT LEFT HIP WITH ANTIBIOTIC SPACER PLACEMENT;  Surgeon: Vernetta Lonni GRADE, MD;  Location: [facility];  Service: Orthopedics;  Laterality: Left;   INCISION AND DRAINAGE HIP Left 01/03/2023   Procedure: IRRIGATION AND DEBRIDEMENT LEFT HIP;  Surgeon: Vernetta Lonni GRADE, MD;  Location: [facility];  Service: Orthopedics;  Laterality: Left;   INCISION AND DRAINAGE HIP Left 12/12/2023   Procedure: IRRIGATION AND DEBRIDEMENT HIP;  Surgeon: Vernetta Lonni GRADE, MD;  Location: [facility];  Service: Orthopedics;  Laterality: Left;  Irrigation and Debridement left hip   INCISION AND DRAINAGE HIP Left 06/23/2024   Procedure: IRRIGATION AND DEBRIDEMENT HIP;  Surgeon: Harden Jerona GAILS, MD;  Location: [facility];  Service: Orthopedics;  Laterality: Left;  LEFT HIP DEBRIDEMENT   LUMBAR LAMINECTOMY/DECOMPRESSION MICRODISCECTOMY Left 03/28/2016   Patient denies: Procedure: Left Lumbar three-four Laminoforaminotomy;  Surgeon: Fairy Levels, MD;  Location: [facility];  Service: Neurosurgery;  Laterality: Left;  left   POLYPECTOMY     RADIOACTIVE SEED IMPLANT  2012   ROTATOR CUFF REPAIR  2006   right   SHOULDER ARTHROSCOPY Right    SPINAL CORD STIMULATOR IMPLANT  06/2017   removed a year after was putted in.   SPINAL CORD STIMULATOR REMOVAL     TOTAL HIP ARTHROPLASTY  2008   left ([employer])   VASECTOMY      Family History  Problem Relation Age of Onset   Lung cancer Mother    Heart disease Father    Colon cancer Neg Hx    Esophageal cancer Neg Hx    Rectal cancer Neg Hx    Stomach cancer Neg Hx    Colon polyps Neg Hx    Social History:  reports that he has never  smoked. He has never used smokeless tobacco. He reports that he does not drink alcohol and does not use drugs.  Allergies: Allergies[1]  No medications prior to admission.    No results found for this or any previous visit (from the past 48 hours). No results found.  Review of Systems  Height 6' 1 (1.854 m), weight 108.9 kg. Physical Exam Vitals reviewed.  Constitutional:      Appearance: Normal appearance.  HENT:     Head: Normocephalic and atraumatic.  Eyes:     Extraocular Movements: Extraocular movements intact.     Pupils: Pupils are equal, round, and reactive to light.  Cardiovascular:     Rate and Rhythm: Normal rate and regular rhythm.  Pulmonary:     Effort: Pulmonary effort is normal.     Breath sounds: Normal breath sounds.  Abdominal:  Palpations: Abdomen is soft.  Musculoskeletal:     Cervical back: Normal range of motion and neck supple.  Neurological:     Mental Status: He is alert and oriented to person, place, and time.  Psychiatric:        Behavior: Behavior normal.   The left hip has an anterior hip incision that is inflamed, red with some cellulitis and a small area of serosanguineous drainage.  Assessment/Plan Chronic prosthetic left hip joint infection  The plan is to proceed to surgery as an outpatient for an open incision and drainage/irrigation and debridement of his left hip and then closing the wound and placement of an incisional wound VAC that will stay on for up to a week following surgery.  This will be done as an outpatient.  The risks and benefits of surgery have been discussed with the patient in detail and he does wish to proceed with a washout of his hip as an outpatient.  [name], MD 10/20/2024, 8:42 PM        [1] Allergies Allergen Reactions   Adhesive [Tape] Rash    Paper tape is ok

## 2024-10-20 NOTE — Progress Notes (Signed)
 SDW call  Patient was given pre-op instructions over the phone. Patient verbalized understanding of instructions provided.  Denies any SOB, fever or cough   PCP - Dr. Aleene Hockey Cardiologist -  Pulmonary:    PPM/ICD - denies Device Orders - na Rep Notified - na   Chest x-ray -  EKG -  06/23/2024 Stress Test - ECHO -  Cardiac Cath -   Sleep Study/sleep apnea/CPAP: denies  Non-diabetic  Blood Thinner Instructions: denies Aspirin  Instructions:states last dose 10/11/2024   ERAS Protcol - Clears until 0600   Anesthesia review: No  Your procedure is scheduled on Thursday October 21, 2024  Report to [facility] Main Entrance A at  0630  A.M., then check in with the Admitting office.  Call this number if you have problems the morning of surgery:  [phone]   If you have any questions prior to your surgery date call [phone]: Open Monday-Friday 8am-4pm If you experience any cold or flu symptoms such as cough, fever, chills, shortness of breath, etc. between now and your scheduled surgery, please notify us  at the above number     Remember:  Do not eat after midnight the night before your surgery  You may drink clear liquids until   0600  the morning of your surgery.   Clear liquids allowed are: Water , Non-Citrus Juices (without pulp), Carbonated Beverages, Clear Tea, Black Coffee ONLY (NO MILK, CREAM OR POWDERED CREAMER of any kind), and Gatorade   Take these medicines the morning of surgery with A SIP OF WATER :  Atorvastatin , doxycycline , vesicare  As needed: Tylenol   As of today, STOP taking any Aspirin  (unless otherwise instructed by your surgeon) Aleve, Naproxen, Ibuprofen , Motrin , Advil , Goody's, BC's, all herbal medications, fish oil, and all vitamins.

## 2024-10-21 ENCOUNTER — Ambulatory Visit (HOSPITAL_COMMUNITY): Admitting: Anesthesiology

## 2024-10-21 ENCOUNTER — Encounter (HOSPITAL_COMMUNITY): Admission: RE | Disposition: A | Payer: Self-pay | Source: Home / Self Care | Attending: Orthopaedic Surgery

## 2024-10-21 ENCOUNTER — Ambulatory Visit (HOSPITAL_COMMUNITY)
Admission: RE | Admit: 2024-10-21 | Discharge: 2024-10-21 | Disposition: A | Discharge status: Home / Self Care | Attending: Orthopaedic Surgery | Admitting: Orthopaedic Surgery

## 2024-10-21 DIAGNOSIS — T8450XA Infection and inflammatory reaction due to unspecified internal joint prosthesis, initial encounter: Secondary | ICD-10-CM | POA: Insufficient documentation

## 2024-10-21 DIAGNOSIS — E119 Type 2 diabetes mellitus without complications: Secondary | ICD-10-CM | POA: Diagnosis not present

## 2024-10-21 DIAGNOSIS — Y831 Surgical operation with implant of artificial internal device as the cause of abnormal reaction of the patient, or of later complication, without mention of misadventure at the time of the procedure: Secondary | ICD-10-CM | POA: Diagnosis not present

## 2024-10-21 DIAGNOSIS — I10 Essential (primary) hypertension: Secondary | ICD-10-CM | POA: Diagnosis not present

## 2024-10-21 DIAGNOSIS — Z792 Long term (current) use of antibiotics: Secondary | ICD-10-CM | POA: Insufficient documentation

## 2024-10-21 DIAGNOSIS — T8452XD Infection and inflammatory reaction due to internal left hip prosthesis, subsequent encounter: Secondary | ICD-10-CM | POA: Diagnosis not present

## 2024-10-21 DIAGNOSIS — Z01818 Encounter for other preprocedural examination: Secondary | ICD-10-CM

## 2024-10-21 DIAGNOSIS — Z86718 Personal history of other venous thrombosis and embolism: Secondary | ICD-10-CM | POA: Insufficient documentation

## 2024-10-21 DIAGNOSIS — T8452XA Infection and inflammatory reaction due to internal left hip prosthesis, initial encounter: Secondary | ICD-10-CM | POA: Diagnosis present

## 2024-10-21 HISTORY — PX: INCISION AND DRAINAGE HIP: SHX1801

## 2024-10-21 LAB — CBC
HCT: 46.5 % (ref 39.0–52.0)
Hemoglobin: 14.6 g/dL (ref 13.0–17.0)
MCH: 27.1 pg (ref 26.0–34.0)
MCHC: 31.4 g/dL (ref 30.0–36.0)
MCV: 86.3 fL (ref 80.0–100.0)
Platelets: 238 K/uL (ref 150–400)
RBC: 5.39 MIL/uL (ref 4.22–5.81)
RDW: 16.7 % — ABNORMAL HIGH (ref 11.5–15.5)
WBC: 5.6 K/uL (ref 4.0–10.5)
nRBC: 0 % (ref 0.0–0.2)

## 2024-10-21 LAB — BASIC METABOLIC PANEL WITH GFR
Anion gap: 9 (ref 5–15)
BUN: 17 mg/dL (ref 8–23)
CO2: 21 mmol/L — ABNORMAL LOW (ref 22–32)
Calcium: 10 mg/dL (ref 8.9–10.3)
Chloride: 105 mmol/L (ref 98–111)
Creatinine, Ser: 1.07 mg/dL (ref 0.61–1.24)
GFR, Estimated: >60 mL/min
Glucose, Bld: 94 mg/dL (ref 70–99)
Potassium: 4.7 mmol/L (ref 3.5–5.1)
Sodium: 135 mmol/L (ref 135–145)

## 2024-10-21 LAB — SURGICAL PCR SCREEN
MRSA, PCR: NEGATIVE
Staphylococcus aureus: NEGATIVE

## 2024-10-21 MED ORDER — ONDANSETRON HCL 4 MG/2ML IJ SOLN: 4.0000 mg | Freq: Once | INTRAMUSCULAR | Status: DC | PRN

## 2024-10-21 MED ORDER — EPHEDRINE 5 MG/ML INJ
INTRAVENOUS | Status: AC
  Filled 2024-10-21: qty 5

## 2024-10-21 MED ORDER — PROPOFOL 10 MG/ML IV BOLUS
INTRAVENOUS | Status: AC
  Filled 2024-10-21: qty 20

## 2024-10-21 MED ORDER — FENTANYL CITRATE (PF) 250 MCG/5ML IJ SOLN
INTRAMUSCULAR | Status: DC | PRN
  Administered 2024-10-21 (×2): 25 ug via INTRAVENOUS
  Administered 2024-10-21: 50 ug via INTRAVENOUS

## 2024-10-21 MED ORDER — OXYCODONE HCL 5 MG/5ML PO SOLN: 5.0000 mg | Freq: Once | ORAL | Status: DC | PRN

## 2024-10-21 MED ORDER — ONDANSETRON HCL 4 MG/2ML IJ SOLN
INTRAMUSCULAR | Status: DC | PRN
  Administered 2024-10-21: 4 mg via INTRAVENOUS

## 2024-10-21 MED ORDER — PROPOFOL 10 MG/ML IV BOLUS
INTRAVENOUS | Status: DC | PRN
  Administered 2024-10-21: 200 mg via INTRAVENOUS

## 2024-10-21 MED ORDER — POVIDONE-IODINE 10 % EX SWAB
2.0000 | Freq: Once | CUTANEOUS | Status: AC
  Administered 2024-10-21: 2 via TOPICAL

## 2024-10-21 MED ORDER — LIDOCAINE 2% (20 MG/ML) 5 ML SYRINGE
INTRAMUSCULAR | Status: AC
  Filled 2024-10-21: qty 5

## 2024-10-21 MED ORDER — HYDROMORPHONE HCL 1 MG/ML IJ SOLN
0.2500 mg | INTRAMUSCULAR | Status: DC | PRN
  Administered 2024-10-21: 0.5 mg via INTRAVENOUS

## 2024-10-21 MED ORDER — DEXAMETHASONE SOD PHOSPHATE PF 10 MG/ML IJ SOLN
INTRAMUSCULAR | Status: DC | PRN
  Administered 2024-10-21 (×3): 5 mg via INTRAVENOUS

## 2024-10-21 MED ORDER — OXYCODONE HCL 5 MG PO TABS: 5.0000 mg | ORAL_TABLET | Freq: Once | ORAL | Status: DC | PRN

## 2024-10-21 MED ORDER — VANCOMYCIN HCL 1000 MG IV SOLR
INTRAVENOUS | Status: DC | PRN
  Administered 2024-10-21: 2 g via TOPICAL

## 2024-10-21 MED ORDER — AMISULPRIDE (ANTIEMETIC) 5 MG/2ML IV SOLN: 10.0000 mg | Freq: Once | INTRAVENOUS | Status: DC | PRN

## 2024-10-21 MED ORDER — LACTATED RINGERS IV SOLN: INTRAVENOUS | Status: DC

## 2024-10-21 MED ORDER — SODIUM CHLORIDE 0.9 % IR SOLN
Status: DC | PRN
  Administered 2024-10-21: 3000 mL

## 2024-10-21 MED ORDER — EPHEDRINE SULFATE-NACL 50-0.9 MG/10ML-% IV SOSY
PREFILLED_SYRINGE | INTRAVENOUS | Status: DC | PRN
  Administered 2024-10-21 (×2): 5 mg via INTRAVENOUS

## 2024-10-21 MED ORDER — LIDOCAINE 2% (20 MG/ML) 5 ML SYRINGE
INTRAMUSCULAR | Status: DC | PRN
  Administered 2024-10-21: 20 mg via INTRAVENOUS

## 2024-10-21 MED ORDER — VANCOMYCIN HCL 1000 MG IV SOLR
INTRAVENOUS | Status: AC
  Filled 2024-10-21: qty 40

## 2024-10-21 MED ORDER — 0.9 % SODIUM CHLORIDE (POUR BTL) OPTIME
TOPICAL | Status: DC | PRN
  Administered 2024-10-21: 1000 mL

## 2024-10-21 MED ORDER — PHENYLEPHRINE 80 MCG/ML (10ML) SYRINGE FOR IV PUSH (FOR BLOOD PRESSURE SUPPORT)
PREFILLED_SYRINGE | INTRAVENOUS | Status: AC
  Filled 2024-10-21: qty 10

## 2024-10-21 MED ORDER — FENTANYL CITRATE (PF) 100 MCG/2ML IJ SOLN
INTRAMUSCULAR | Status: AC
  Filled 2024-10-21: qty 2

## 2024-10-21 MED ORDER — VANCOMYCIN HCL 1000 MG IV SOLR
INTRAVENOUS | Status: DC | PRN
  Administered 2024-10-21: 1000 mg via INTRAVENOUS

## 2024-10-21 MED ORDER — CHLORHEXIDINE GLUCONATE 0.12 % MT SOLN
15.0000 mL | Freq: Once | OROMUCOSAL | Status: AC
  Administered 2024-10-21: 15 mL via OROMUCOSAL
  Filled 2024-10-21: qty 15

## 2024-10-21 MED ORDER — DEXAMETHASONE SOD PHOSPHATE PF 10 MG/ML IJ SOLN
INTRAMUSCULAR | Status: AC
  Filled 2024-10-21: qty 1

## 2024-10-21 MED ORDER — CEFAZOLIN SODIUM-DEXTROSE 2-4 GM/100ML-% IV SOLN
2.0000 g | INTRAVENOUS | Status: AC
  Administered 2024-10-21: 2 g via INTRAVENOUS
  Filled 2024-10-21: qty 100

## 2024-10-21 MED ORDER — OXYCODONE HCL 5 MG PO TABS: 5.0000 mg | ORAL_TABLET | Freq: Four times a day (QID) | ORAL | 0 refills | Status: AC | PRN

## 2024-10-21 MED ORDER — VANCOMYCIN HCL 1000 MG IV SOLR
INTRAVENOUS | Status: AC
  Filled 2024-10-21: qty 20

## 2024-10-21 MED ORDER — ONDANSETRON HCL 4 MG/2ML IJ SOLN
INTRAMUSCULAR | Status: AC
  Filled 2024-10-21: qty 2

## 2024-10-21 MED ORDER — ACETAMINOPHEN 500 MG PO TABS
1000.0000 mg | ORAL_TABLET | Freq: Once | ORAL | Status: AC
  Administered 2024-10-21: 1000 mg via ORAL
  Filled 2024-10-21: qty 2

## 2024-10-21 MED ORDER — ROCURONIUM BROMIDE 10 MG/ML (PF) SYRINGE
PREFILLED_SYRINGE | INTRAVENOUS | Status: AC
  Filled 2024-10-21: qty 10

## 2024-10-21 MED ORDER — HYDROMORPHONE HCL 1 MG/ML IJ SOLN
INTRAMUSCULAR | Status: AC
  Filled 2024-10-21: qty 1

## 2024-10-21 MED ORDER — ORAL CARE MOUTH RINSE: 15.0000 mL | Freq: Once | OROMUCOSAL | Status: AC

## 2024-10-21 NOTE — Anesthesia Procedure Notes (Signed)
 Procedure Name: LMA Insertion Date/Time: 10/21/2024 8:58 AM  Performed by: Elby Raelene SAUNDERS, CRNAPre-anesthesia Checklist: Patient identified, Emergency Drugs available, Suction available and Patient being monitored Patient Re-evaluated:Patient Re-evaluated prior to induction Oxygen Delivery Method: Circle System Utilized Preoxygenation: Pre-oxygenation with 100% oxygen Induction Type: IV induction Ventilation: Mask ventilation without difficulty LMA: LMA inserted LMA Size: 4.0 Number of attempts: 1 Placement Confirmation: positive ETCO2 Tube secured with: Tape Dental Injury: Teeth and Oropharynx as per pre-operative assessment

## 2024-10-21 NOTE — Anesthesia Postprocedure Evaluation (Signed)
"   Anesthesia Post Note  Patient: [name]  Procedure(s) Performed: IRRIGATION AND DEBRIDEMENT HIP (Left: Hip)     Patient location during evaluation: PACU Anesthesia Type: General Level of consciousness: awake and alert, oriented and patient cooperative Pain management: pain level controlled Vital Signs Assessment: post-procedure vital signs reviewed and stable Respiratory status: spontaneous breathing, nonlabored ventilation and respiratory function stable Cardiovascular status: blood pressure returned to baseline and stable Postop Assessment: no apparent nausea or vomiting Anesthetic complications: no   No notable events documented.  Last Vitals:  Vitals:   10/21/24 1015 10/21/24 1030  BP: 123/70 135/69  Pulse: (!) 55 (!) 55  Resp: (!) 9 16  Temp:    SpO2: 96% 96%    Last Pain:  Vitals:   10/21/24 1030  TempSrc:   PainSc: 4                  Jayzen Paver,E. Munachimso Palin      "

## 2024-10-21 NOTE — Discharge Instructions (Signed)
 You may put your full weight on your left hip and leg as comfort allows. The incisional VAC can stay over your incision until the canister becomes full or the VAC starts to alarm.   If possible, the VAC can stay on for 5 to 6 days. Once the [facility] is removed at home, place dry dressings daily over your hip incision. Try to keep the hip incision itself dry for the next 7 to 10 days if possible.

## 2024-10-21 NOTE — Anesthesia Preprocedure Evaluation (Addendum)
"                                    Anesthesia Evaluation  Patient identified by MRN, date of birth, ID band Patient awake    Reviewed: Allergy & Precautions, NPO status , Patient's Chart, lab work & pertinent test results  Airway Mallampati: III  TM Distance: >3 FB Neck ROM: Full    Dental  (+) Teeth Intact, Dental Advisory Given   Pulmonary neg pulmonary ROS   Pulmonary exam normal breath sounds clear to auscultation       Cardiovascular hypertension (167/89 preop, per pt normally 120130 s SBP but does not check at home, no home meds), + DVT (2017, provoked)  Normal cardiovascular exam Rhythm:Regular Rate:Normal     Neuro/Psych negative neurological ROS  negative psych ROS   GI/Hepatic negative GI ROS, Neg liver ROS,,,  Endo/Other  diabetes (prediabetic)  BMI 32  Renal/GU negative Renal ROS  negative genitourinary   Musculoskeletal  (+) Arthritis , Osteoarthritis,  Chronic LBP Chronic hip infection   Abdominal   Peds  Hematology negative hematology ROS (+)   Anesthesia Other Findings   Reproductive/Obstetrics negative OB ROS                              Anesthesia Physical Anesthesia Plan  ASA: 2  Anesthesia Plan: General   Post-op Pain Management: Tylenol  PO (pre-op)*   Induction: Intravenous  PONV Risk Score and Plan: Ondansetron , Dexamethasone  and Treatment may vary due to age or medical condition  Airway Management Planned: LMA  Additional Equipment: None  Intra-op Plan:   Post-operative Plan: Extubation in OR  Informed Consent: I have reviewed the patients History and Physical, chart, labs and discussed the procedure including the risks, benefits and alternatives for the proposed anesthesia with the patient or authorized representative who has indicated his/her understanding and acceptance.     Dental advisory given  Plan Discussed with: CRNA  Anesthesia Plan Comments: (Last here 06/2024 for hip  debridement under LMA, no issues)         Anesthesia Quick Evaluation  "

## 2024-10-21 NOTE — Op Note (Signed)
 "    Operative Note  Date of operation: 10/21/2024 Diagnosis: Left prosthetic hip joint chronic infection, subsequent encounter Postoperative diagnosis: Same  Procedure: Left hip incision and drainage/irrigation and debridement  Surgeon: Lonni GRADE. Vernetta, MD Assistant: Tory Gaskins, PA-C  Anesthesia: General Antibiotics: IV Ancef  and IV vancomycin  EBL: 100 cc Complications: None  Indications: The patient is a 79 year old gentleman well-known to us .  He is been dealing with a chronic infection involving his left hip prosthesis.  He has had multiple incision and drainages/irrigation and debridements of his left hip over the years.  He is on suppressive antibiotics and often has to be taken to the operating room for an I&D of the hip.  He has had multiple revision surgeries and at this point given his age and mobility, he does not wish to have the components removed.  This would be quite an undertaking to have the components removed and we would not be able to put anything back in his hip other than a permanent antibiotic spacer this would limit his mobility and weightbearing.  He also takes care of his wife with dementia.  We talked about a return to the operating room today for an open incision and drainage of the hip with placing deep antibiotics in the wound, irrigating it with cleansing solution and then closing the incision completely with an incisional VAC to go home on the same day.  He would like to have this done this way.  The risks and benefits of surgery been discussed in detail and informed consent is been obtained.  Procedure description: After informed consent was obtained and the appropriate left hip was marked, the patient was brought to the operating room and placed upon the operative table.  General anesthesia was then obtained.  His left hip area was prepped and draped with DuraPrep and sterile drapes.  A timeout is called he identifies correct patient correct left hip.  His  incision was identified from previous surgeries and there was definitely an area that had been draining.  There was some associated cellulitis as well.  We opened the incision with a #10 blade more extensively and encountered a seroma that was drained.  We dissected the superficial tissues down to the hip joint itself to expose the hip joint.  We then used a #10 blade and around your to sharply debride soft tissue and fascia deep in the wound.  No bone was removed.  Once we had exposed the prosthesis we then thoroughly irrigated the joint with 3 L of normal saline solution using pulsatile lavage.  Once we had completed that and removed some of the necrotic tissue, we then irrigated the wound with 1 L of Irrisept chlorhexidine  gluconate solution.  We allowed this to stay in the wound for several minutes and then irrigated that solution of the wound.  We then placed vancomycin  powder along the arthrotomy and hip joint itself.  We then closed the deep tissue with interrupted #1 Vicryl followed by 0 Vicryl because the next layer, 2-0 Vicryl close subcutaneous tissue interrupted 2-0 nylon to close the skin.  An incisional VAC they can go home with the patient today was placed over the wound and was set to suction and had a good seal.  The patient was awakened, extubated and taken the recovery room in stable condition.  Postoperatively he will go home today and continue his regular antibiotic regiment.  The home [facility] will stay in place when he goes home.  We will  see him back in the office in 2 weeks. "

## 2024-10-21 NOTE — Interval H&P Note (Signed)
 History and Physical Interval Note: The patient understands that he is here today for an incision and drainage/irrigation debridement of his chronic left hip infection.  He is fully aware the risk and benefits of the surgery having had it done multiple times in the past.  There has been no acute or interval change in his medical status.  Informed consent has been obtained and the left operative hip has been marked.  10/21/2024 8:38 AM  [name]  has presented today for surgery, with the diagnosis of chronic left hip joint infection.  The various methods of treatment have been discussed with the patient and family. After consideration of risks, benefits and other options for treatment, the patient has consented to  Procedures with comments: IRRIGATION AND DEBRIDEMENT HIP (Left) - Incision and Drainage Left Hip as a surgical intervention.  The patient's history has been reviewed, patient examined, no change in status, stable for surgery.  I have reviewed the patient's chart and labs.  Questions were answered to the patient's satisfaction.     [name]

## 2024-10-21 NOTE — Transfer of Care (Signed)
 Immediate Anesthesia Transfer of Care Note  Patient: [name]  Procedure(s) Performed: IRRIGATION AND DEBRIDEMENT HIP (Left: Hip)  Patient Location: PACU  Anesthesia Type:General  Level of Consciousness: awake, alert , and oriented  Airway & Oxygen Therapy: Patient Spontanous Breathing  Post-op Assessment: Report given to RN and Post -op Vital signs reviewed and stable  Post vital signs: Reviewed and stable  Last Vitals:  Vitals Value Taken Time  BP 123/70 10/21/24 10:15  Temp 36.4 C 10/21/24 10:00  Pulse 55 10/21/24 10:16  Resp 9 10/21/24 10:15  SpO2 97 % 10/21/24 10:16  Vitals shown include unfiled device data.  Last Pain:  Vitals:   10/21/24 1000  TempSrc:   PainSc: 0-No pain         Complications: No notable events documented.

## 2024-10-22 ENCOUNTER — Encounter (HOSPITAL_COMMUNITY): Payer: Self-pay | Admitting: Orthopaedic Surgery

## 2024-11-04 ENCOUNTER — Encounter: Payer: Self-pay | Admitting: Orthopaedic Surgery

## 2024-11-04 ENCOUNTER — Ambulatory Visit: Admitting: Orthopaedic Surgery

## 2024-11-04 DIAGNOSIS — Z9889 Other specified postprocedural states: Secondary | ICD-10-CM

## 2024-11-04 NOTE — Progress Notes (Signed)
 The patient comes in today to weeks after an incision and drainage/irrigation debridement of a chronic left hip infection.  On exam the sutures were all removed.  There is a small area with some draining that he has had chronic drainage from before but overall the tissue looks good and he feels better.  He will place mupirocin ointment over the wound daily with dry dressing.  Will see him back in a month to see how he is doing overall.  He does have a painful arthritic right knee so I did place a steroid injection in his right knee today per his request.

## 2024-12-02 ENCOUNTER — Encounter: Admitting: Orthopaedic Surgery

## 2025-02-16 ENCOUNTER — Ambulatory Visit: Admitting: Internal Medicine

## 2025-04-06 ENCOUNTER — Encounter

## 2025-05-09 ENCOUNTER — Encounter: Admitting: Family Medicine
